# Patient Record
Sex: Female | Born: 1965
Health system: Southern US, Community
[De-identification: ages and names within clinical notes are randomized; demographics above are authoritative.]

## PROBLEM LIST (undated history)

## (undated) DIAGNOSIS — J45909 Unspecified asthma, uncomplicated: Secondary | ICD-10-CM

## (undated) DIAGNOSIS — K5900 Constipation, unspecified: Secondary | ICD-10-CM

## (undated) DIAGNOSIS — F32A Depression, unspecified: Secondary | ICD-10-CM

## (undated) DIAGNOSIS — E739 Lactose intolerance, unspecified: Secondary | ICD-10-CM

## (undated) DIAGNOSIS — F419 Anxiety disorder, unspecified: Secondary | ICD-10-CM

## (undated) DIAGNOSIS — R5383 Other fatigue: Principal | ICD-10-CM

## (undated) DIAGNOSIS — E039 Hypothyroidism, unspecified: Secondary | ICD-10-CM

## (undated) DIAGNOSIS — G709 Myoneural disorder, unspecified: Secondary | ICD-10-CM

## (undated) DIAGNOSIS — R0602 Shortness of breath: Secondary | ICD-10-CM

## (undated) DIAGNOSIS — Z86718 Personal history of other venous thrombosis and embolism: Secondary | ICD-10-CM

## (undated) DIAGNOSIS — R238 Other skin changes: Secondary | ICD-10-CM

## (undated) DIAGNOSIS — C719 Malignant neoplasm of brain, unspecified: Secondary | ICD-10-CM

## (undated) DIAGNOSIS — M2550 Pain in unspecified joint: Secondary | ICD-10-CM

## (undated) DIAGNOSIS — Q0700 Arnold-Chiari syndrome without spina bifida or hydrocephalus: Secondary | ICD-10-CM

## (undated) DIAGNOSIS — H9313 Tinnitus, bilateral: Secondary | ICD-10-CM

## (undated) DIAGNOSIS — R7303 Prediabetes: Secondary | ICD-10-CM

## (undated) DIAGNOSIS — H6980 Other specified disorders of Eustachian tube, unspecified ear: Secondary | ICD-10-CM

## (undated) DIAGNOSIS — M436 Torticollis: Secondary | ICD-10-CM

## (undated) DIAGNOSIS — G4733 Obstructive sleep apnea (adult) (pediatric): Secondary | ICD-10-CM

## (undated) DIAGNOSIS — T8859XA Other complications of anesthesia, initial encounter: Secondary | ICD-10-CM

## (undated) DIAGNOSIS — M5126 Other intervertebral disc displacement, lumbar region: Secondary | ICD-10-CM

## (undated) DIAGNOSIS — R5381 Other malaise: Principal | ICD-10-CM

## (undated) DIAGNOSIS — G43909 Migraine, unspecified, not intractable, without status migrainosus: Secondary | ICD-10-CM

## (undated) DIAGNOSIS — G8929 Other chronic pain: Secondary | ICD-10-CM

## (undated) DIAGNOSIS — R32 Unspecified urinary incontinence: Secondary | ICD-10-CM

## (undated) DIAGNOSIS — R233 Spontaneous ecchymoses: Secondary | ICD-10-CM

## (undated) DIAGNOSIS — K649 Unspecified hemorrhoids: Secondary | ICD-10-CM

## (undated) DIAGNOSIS — H6990 Unspecified Eustachian tube disorder, unspecified ear: Secondary | ICD-10-CM

## (undated) DIAGNOSIS — H53149 Visual discomfort, unspecified: Secondary | ICD-10-CM

## (undated) DIAGNOSIS — G932 Benign intracranial hypertension: Secondary | ICD-10-CM

## (undated) DIAGNOSIS — H539 Unspecified visual disturbance: Secondary | ICD-10-CM

## (undated) DIAGNOSIS — H814 Vertigo of central origin: Secondary | ICD-10-CM

## (undated) DIAGNOSIS — F909 Attention-deficit hyperactivity disorder, unspecified type: Secondary | ICD-10-CM

## (undated) DIAGNOSIS — G43019 Migraine without aura, intractable, without status migrainosus: Secondary | ICD-10-CM

## (undated) DIAGNOSIS — Q796 Ehlers-Danlos syndrome, unspecified: Secondary | ICD-10-CM

## (undated) DIAGNOSIS — B009 Herpesviral infection, unspecified: Secondary | ICD-10-CM

## (undated) DIAGNOSIS — J189 Pneumonia, unspecified organism: Secondary | ICD-10-CM

## (undated) DIAGNOSIS — E559 Vitamin D deficiency, unspecified: Secondary | ICD-10-CM

## (undated) DIAGNOSIS — K219 Gastro-esophageal reflux disease without esophagitis: Secondary | ICD-10-CM

## (undated) DIAGNOSIS — G4730 Sleep apnea, unspecified: Secondary | ICD-10-CM

## (undated) DIAGNOSIS — Q288 Other specified congenital malformations of circulatory system: Principal | ICD-10-CM

## (undated) DIAGNOSIS — Z8669 Personal history of other diseases of the nervous system and sense organs: Secondary | ICD-10-CM

## (undated) DIAGNOSIS — Z973 Presence of spectacles and contact lenses: Secondary | ICD-10-CM

## (undated) DIAGNOSIS — D496 Neoplasm of unspecified behavior of brain: Secondary | ICD-10-CM

## (undated) DIAGNOSIS — R531 Weakness: Secondary | ICD-10-CM

## (undated) DIAGNOSIS — M549 Dorsalgia, unspecified: Secondary | ICD-10-CM

## (undated) HISTORY — PX: TONSILLECTOMY AND ADENOIDECTOMY: SUR1326

## (undated) HISTORY — DX: Gastro-esophageal reflux disease without esophagitis: K21.9

## (undated) HISTORY — DX: Other specified congenital malformations of circulatory system: Q28.8

## (undated) HISTORY — DX: Dorsalgia, unspecified: M54.9

## (undated) HISTORY — DX: Pain in unspecified joint: M25.50

## (undated) HISTORY — PX: BRAIN SURGERY: SHX531

## (undated) HISTORY — DX: Depression, unspecified: F32.A

## (undated) HISTORY — DX: Attention-deficit hyperactivity disorder, unspecified type: F90.9

## (undated) HISTORY — DX: Prediabetes: R73.03

## (undated) HISTORY — PX: DILATION AND CURETTAGE OF UTERUS: SHX78

## (undated) HISTORY — DX: Arnold-Chiari syndrome without spina bifida or hydrocephalus: Q07.00

## (undated) HISTORY — DX: Other malaise: R53.81

## (undated) HISTORY — DX: Vertigo of central origin: H81.4

## (undated) HISTORY — DX: Shortness of breath: R06.02

## (undated) HISTORY — DX: Unspecified eustachian tube disorder, unspecified ear: H69.90

## (undated) HISTORY — DX: Other fatigue: R53.83

## (undated) HISTORY — DX: Hypothyroidism, unspecified: E03.9

## (undated) HISTORY — PX: BREAST BIOPSY: SHX20

## (undated) HISTORY — DX: Obstructive sleep apnea (adult) (pediatric): G47.33

## (undated) HISTORY — DX: Benign intracranial hypertension: G93.2

## (undated) HISTORY — DX: Personal history of other venous thrombosis and embolism: Z86.718

## (undated) HISTORY — DX: Other chronic pain: G89.29

## (undated) HISTORY — DX: Sleep apnea, unspecified: G47.30

## (undated) HISTORY — PX: CHOLECYSTECTOMY: SHX55

## (undated) HISTORY — DX: Anxiety disorder, unspecified: F41.9

## (undated) HISTORY — DX: Vitamin D deficiency, unspecified: E55.9

## (undated) HISTORY — DX: Unspecified hemorrhoids: K64.9

## (undated) HISTORY — DX: Lactose intolerance, unspecified: E73.9

## (undated) HISTORY — DX: Neoplasm of unspecified behavior of brain: D49.6

## (undated) HISTORY — DX: Migraine, unspecified, not intractable, without status migrainosus: G43.909

## (undated) HISTORY — DX: Migraine without aura, intractable, without status migrainosus: G43.019

## (undated) HISTORY — PX: OTHER SURGICAL HISTORY: SHX169

## (undated) HISTORY — PX: BREAST EXCISIONAL BIOPSY: SUR124

## (undated) HISTORY — DX: Constipation, unspecified: K59.00

## (undated) HISTORY — PX: UPPER GI ENDOSCOPY: SHX6162

## (undated) HISTORY — DX: Ehlers-Danlos syndrome, unspecified: Q79.60

## (undated) HISTORY — DX: Herpesviral infection, unspecified: B00.9

## (undated) HISTORY — DX: Other specified disorders of Eustachian tube, unspecified ear: H69.80

---

## 2006-06-22 ENCOUNTER — Ambulatory Visit: Payer: Self-pay | Admitting: Gastroenterology

## 2006-09-07 ENCOUNTER — Emergency Department (HOSPITAL_COMMUNITY): Admission: EM | Admit: 2006-09-07 | Discharge: 2006-09-07 | Payer: Self-pay | Admitting: Emergency Medicine

## 2008-03-10 HISTORY — PX: OTHER SURGICAL HISTORY: SHX169

## 2009-02-05 ENCOUNTER — Encounter: Admission: RE | Admit: 2009-02-05 | Discharge: 2009-02-05 | Payer: Self-pay | Admitting: Anesthesiology

## 2010-01-16 ENCOUNTER — Encounter: Admission: RE | Admit: 2010-01-16 | Discharge: 2010-01-16 | Payer: Self-pay | Admitting: Internal Medicine

## 2010-05-23 ENCOUNTER — Other Ambulatory Visit: Payer: Self-pay | Admitting: *Deleted

## 2010-05-23 ENCOUNTER — Ambulatory Visit
Admission: RE | Admit: 2010-05-23 | Discharge: 2010-05-23 | Disposition: A | Discharge status: Home / Self Care | Payer: 59 | Source: Ambulatory Visit | Attending: *Deleted | Admitting: *Deleted

## 2010-05-23 DIAGNOSIS — M549 Dorsalgia, unspecified: Secondary | ICD-10-CM

## 2010-05-23 DIAGNOSIS — M542 Cervicalgia: Secondary | ICD-10-CM

## 2010-07-26 NOTE — Letter (Signed)
June 22, 2006     April Meyer, April Meyer  MRN:  [id number]  /  DOB:  07-Jul-1965   Dear [name]:   It is my pleasure to have treated you recently as a new patient in my  office.  I appreciate your confidence and the opportunity to participate  in your care.   Since I do have a busy inpatient endoscopy schedule and office schedule,  my office hours vary weekly.  I am, however, available for emergency  calls every day through my office.  If I cannot promptly meet an urgent  office appointment, another one of our gastroenterologists will be able  to assist you.   My well-trained staff are prepared to help you at all times.  For  emergencies after office hours, a physician from our gastroenterology  section is always available through my 24-hour answering service.   While you are under my care, I encourage discussion of your questions  and concerns, and I will be happy to return your calls as soon as I am  available.   Once again, I welcome you as a new patient and I look forward to a happy  and healthy relationship.    Sincerely,      Barbette Hair. Arlyce Dice, MD,FACG  Electronically Signed    RDK/MedQ  DD: 06/22/2006  DT: 06/22/2006  Job #: [id number]

## 2010-07-26 NOTE — Letter (Signed)
June 22, 2006    Tammy R. Collins Scotland, M.D.  [address] [place] [address]   RE:  April Meyer, April Meyer  MRN:  [id number]  /  DOB:  February 25, 1966   Dear Dr. Collins Scotland:   Upon your kind referral, I had the pleasure of evaluating your patient  and I am pleased to offer my findings.  I saw Chloris Marcoux in the office  today.  Enclosed is a copy of my progress note that details my findings  and recommendations.   Thank you for the opportunity to participate in your patient's care.    Sincerely,      Barbette Hair. Arlyce Dice, MD,FACG  Electronically Signed    RDK/MedQ  DD: 06/22/2006  DT: 06/22/2006  Job #: [id number]

## 2010-07-26 NOTE — Assessment & Plan Note (Signed)
[facility]                         GASTROENTEROLOGY OFFICE NOTE   NAME:Julio, Dariann                             MRN:          [id number]  DATE:06/22/2006                            DOB:          1965-11-25    REASON FOR CONSULTATION:  Abdominal pain.   HISTORY OF PRESENT ILLNESS:  Ms. Menard is a pleasant 45 year old white  female, who is referred through the courtesy of Dr. Collins Scotland for  evaluation.  Over the last couple of months, she has been complaining of  right upper quadrant pain.  Pain consisted of stabbing pain lasting  seconds.  They may be fairly constant by virtue of it being recurrent.  Pain that is without radiation.  It does not change by movements,  bending, breathing, or twisting.  It is not affected by eating or bowel  movements.  She is without fever.  She may have mild nausea if the pain  is severe.  She is status post cholecystectomy where she had, by her  description, what sounds like gallbladder sludge.  Recent ultrasound,  liver tests, and CBC were normal.  She does claim that her pain has been  lessening in frequency and intensity over the last 2 weeks.   PAST MEDICAL HISTORY:  Pertinent for thyroid disease.   FAMILY HISTORY:  Positive for a father with inflammatory bowel disease.   MEDICATIONS:  Levothyroxine and Prevacid.   SHE CLAIMS TO HAVE HAD AN ANAPHYLACTIC REACTION TO PREDNISONE.   She smokes a pack a day.  She does not drink.  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   REVIEW OF SYSTEMS:  Reviewed and is negative.   PHYSICAL EXAMINATION:  VITAL SIGNS:  VITAL SIGNS:  Pulse 76, blood  pressure 106/72, weight 218.  HEENT: EOMI. PERRLA. Sclerae are anicteric.  Conjunctivae are pink.  NECK:  Supple without thyromegaly, adenopathy or carotid bruits.  CHEST:  Clear to auscultation and percussion without adventitious  sounds.  CARDIAC:  Regular rhythm; normal S1 S2.  There are no murmurs, gallops  or rubs.  ABDOMEN:  She has  mild right upper quadrant tenderness in the subcostal  area.  Tenderness is worsened with abdominal wall flexion.  There are no  abdominal masses or organomegaly.  EXTREMITIES:  Full range of motion.  No cyanosis, clubbing or edema.  RECTAL:  Deferred.   IMPRESSION:  Intermittent right upper quadrant pain.  I suspect this is  abdominal wall pain.  It is unlikely this is due to visceral pain.   RECOMMENDATION:  Trial of Clinoril 150 mg b.i.d. x5-7 days and then as  needed.   I carefully instructed Ms. Radigan to contact me if she has developed  recurrent or worsening symptoms.     Barbette Hair. Arlyce Dice, MD,FACG  Electronically Signed    RDK/MedQ  DD: 06/22/2006  DT: 06/22/2006  Job #: [id number]   cc:   Tammy R. Collins Scotland, M.D.

## 2010-08-08 ENCOUNTER — Ambulatory Visit (INDEPENDENT_AMBULATORY_CARE_PROVIDER_SITE_OTHER): Payer: 59 | Admitting: Gynecology

## 2010-08-08 DIAGNOSIS — A6004 Herpesviral vulvovaginitis: Secondary | ICD-10-CM

## 2010-08-08 DIAGNOSIS — G935 Compression of brain: Secondary | ICD-10-CM

## 2010-08-08 DIAGNOSIS — N393 Stress incontinence (female) (male): Secondary | ICD-10-CM

## 2010-08-08 DIAGNOSIS — Z30431 Encounter for routine checking of intrauterine contraceptive device: Secondary | ICD-10-CM

## 2010-09-03 ENCOUNTER — Ambulatory Visit (HOSPITAL_COMMUNITY): Admission: RE | Admit: 2010-09-03 | Payer: 59 | Source: Ambulatory Visit | Admitting: Urology

## 2010-09-09 ENCOUNTER — Ambulatory Visit: Payer: 59 | Admitting: Gynecology

## 2010-10-09 ENCOUNTER — Ambulatory Visit: Payer: 59 | Admitting: Gynecology

## 2010-10-09 HISTORY — PX: INTRAUTERINE DEVICE INSERTION: SHX323

## 2010-10-28 ENCOUNTER — Ambulatory Visit: Payer: 59 | Admitting: Gynecology

## 2010-10-29 ENCOUNTER — Ambulatory Visit (INDEPENDENT_AMBULATORY_CARE_PROVIDER_SITE_OTHER): Payer: 59 | Admitting: Gynecology

## 2010-10-29 ENCOUNTER — Encounter: Payer: Self-pay | Admitting: Gynecology

## 2010-10-29 VITALS — BP 120/74

## 2010-10-29 DIAGNOSIS — Z30431 Encounter for routine checking of intrauterine contraceptive device: Secondary | ICD-10-CM

## 2010-10-29 MED ORDER — LEVONORGESTREL 20 MCG/24HR IU IUD
INTRAUTERINE_SYSTEM | Freq: Once | INTRAUTERINE | Status: AC
  Administered 2010-10-30: 09:00:00 via INTRAUTERINE

## 2010-10-29 NOTE — Progress Notes (Signed)
Patient presents for Mirena IUD placement currently on a regular menses. We previously counseled her per 08/08/2010 note. I reviewed again with her what's involved with Mirena IUD placement, the procedure risks, issues, short-term infection, uterine perforation requiring surgery to remove, long-term issues of infection and failure with pregnancy. She has read through the booklet, signed the consent form and again given her neurologic issues she understands the thrombosis risk at least theoretically to include stroke heart attack DVT accepts this.  She has had a Mirena IUD in the past and did well with this.  Exam Pelvic: External BUS vagina normal, cervix normal light menses flow, uterus retroverted normal size midline mobile nontender adnexa without masses or tenderness  Procedure: Cervix was visualized with a speculum cleansed with Betadine, single-tooth tenaculum anterior lip stabilization, uterus was sounded and subsequently a Mirena IUD was placed according to manufacturer's recommendation. String was trimmed patient tolerated well will followup in one month for postinsertional check.

## 2010-10-31 ENCOUNTER — Other Ambulatory Visit: Payer: Self-pay | Admitting: Anesthesiology

## 2010-10-31 DIAGNOSIS — M545 Low back pain, unspecified: Secondary | ICD-10-CM

## 2010-11-04 ENCOUNTER — Ambulatory Visit
Admission: RE | Admit: 2010-11-04 | Discharge: 2010-11-04 | Disposition: A | Discharge status: Home / Self Care | Payer: 59 | Source: Ambulatory Visit | Attending: Anesthesiology | Admitting: Anesthesiology

## 2010-11-04 DIAGNOSIS — M545 Low back pain, unspecified: Secondary | ICD-10-CM

## 2010-11-09 HISTORY — PX: IUD REMOVAL: SHX5392

## 2010-11-15 ENCOUNTER — Ambulatory Visit (INDEPENDENT_AMBULATORY_CARE_PROVIDER_SITE_OTHER): Payer: 59 | Admitting: Gynecology

## 2010-11-15 ENCOUNTER — Encounter: Payer: Self-pay | Admitting: Gynecology

## 2010-11-15 DIAGNOSIS — T839XXA Unspecified complication of genitourinary prosthetic device, implant and graft, initial encounter: Secondary | ICD-10-CM

## 2010-11-15 DIAGNOSIS — T8389XA Other specified complication of genitourinary prosthetic devices, implants and grafts, initial encounter: Secondary | ICD-10-CM

## 2010-11-15 NOTE — Progress Notes (Signed)
Patient presents wanting her IUD removed. She just had placed last month and is having more hormone side effects to include bilateral breast tenderness and a 5 pound weight gain. She feels bloated and wants to have it removed. I discussed with her that we could try to treat the symptoms individually keep the IUD for now and hopefully she will accommodate to the symptoms over the next month or so but she refuses and wants the IUD removed. She is using this for contraception although notes that her coital frequency is rare. I stressed the need for using alternative contraception she does become sexually active.  Exam Breasts: Right and left without masses retractions discharge adenopathy Abdomen: Soft nontender without masses guarding rebound organomegaly Pelvic: External BUS vagina normal cervix normal IUD string visualized grasped with Bozeman forcep and removed shown to the patient and discarded. Bimanual uterus normal size midline mobile nontender adnexa without masses or tenderness  Assessment and plan: Intolerance to IUD. IUD was removed. Patient instructed to use alternative contraception follow up with me she wants to discuss other forms of birth control.  If her symptoms of bloating breast tenderness continue she history of present for further evaluation.

## 2010-11-26 ENCOUNTER — Ambulatory Visit: Payer: 59 | Admitting: Gynecology

## 2011-03-14 DIAGNOSIS — M545 Low back pain, unspecified: Secondary | ICD-10-CM | POA: Diagnosis not present

## 2011-03-14 DIAGNOSIS — G935 Compression of brain: Secondary | ICD-10-CM | POA: Diagnosis not present

## 2011-03-14 DIAGNOSIS — M542 Cervicalgia: Secondary | ICD-10-CM | POA: Diagnosis not present

## 2011-03-19 DIAGNOSIS — G935 Compression of brain: Secondary | ICD-10-CM | POA: Diagnosis not present

## 2011-03-19 DIAGNOSIS — M545 Low back pain, unspecified: Secondary | ICD-10-CM | POA: Diagnosis not present

## 2011-03-19 DIAGNOSIS — M542 Cervicalgia: Secondary | ICD-10-CM | POA: Diagnosis not present

## 2011-03-27 DIAGNOSIS — M545 Low back pain, unspecified: Secondary | ICD-10-CM | POA: Diagnosis not present

## 2011-03-27 DIAGNOSIS — M542 Cervicalgia: Secondary | ICD-10-CM | POA: Diagnosis not present

## 2011-03-27 DIAGNOSIS — G935 Compression of brain: Secondary | ICD-10-CM | POA: Diagnosis not present

## 2011-04-01 DIAGNOSIS — G935 Compression of brain: Secondary | ICD-10-CM | POA: Diagnosis not present

## 2011-04-01 DIAGNOSIS — M545 Low back pain, unspecified: Secondary | ICD-10-CM | POA: Diagnosis not present

## 2011-04-01 DIAGNOSIS — M542 Cervicalgia: Secondary | ICD-10-CM | POA: Diagnosis not present

## 2011-04-02 DIAGNOSIS — M79609 Pain in unspecified limb: Secondary | ICD-10-CM | POA: Diagnosis not present

## 2011-04-02 DIAGNOSIS — M542 Cervicalgia: Secondary | ICD-10-CM | POA: Diagnosis not present

## 2011-04-02 DIAGNOSIS — G4452 New daily persistent headache (NDPH): Secondary | ICD-10-CM | POA: Diagnosis not present

## 2011-04-02 DIAGNOSIS — IMO0002 Reserved for concepts with insufficient information to code with codable children: Secondary | ICD-10-CM | POA: Diagnosis not present

## 2011-04-02 DIAGNOSIS — R51 Headache: Secondary | ICD-10-CM | POA: Diagnosis not present

## 2011-04-02 DIAGNOSIS — M5126 Other intervertebral disc displacement, lumbar region: Secondary | ICD-10-CM | POA: Diagnosis not present

## 2011-04-08 DIAGNOSIS — F411 Generalized anxiety disorder: Secondary | ICD-10-CM | POA: Diagnosis not present

## 2011-04-10 DIAGNOSIS — M542 Cervicalgia: Secondary | ICD-10-CM | POA: Diagnosis not present

## 2011-04-10 DIAGNOSIS — M545 Low back pain, unspecified: Secondary | ICD-10-CM | POA: Diagnosis not present

## 2011-04-10 DIAGNOSIS — G935 Compression of brain: Secondary | ICD-10-CM | POA: Diagnosis not present

## 2011-04-11 DIAGNOSIS — G245 Blepharospasm: Secondary | ICD-10-CM | POA: Diagnosis not present

## 2011-04-11 DIAGNOSIS — E559 Vitamin D deficiency, unspecified: Secondary | ICD-10-CM | POA: Diagnosis not present

## 2011-04-11 DIAGNOSIS — E039 Hypothyroidism, unspecified: Secondary | ICD-10-CM | POA: Diagnosis not present

## 2011-04-14 DIAGNOSIS — M542 Cervicalgia: Secondary | ICD-10-CM | POA: Diagnosis not present

## 2011-04-14 DIAGNOSIS — M545 Low back pain, unspecified: Secondary | ICD-10-CM | POA: Diagnosis not present

## 2011-04-14 DIAGNOSIS — G935 Compression of brain: Secondary | ICD-10-CM | POA: Diagnosis not present

## 2011-04-15 DIAGNOSIS — M357 Hypermobility syndrome: Secondary | ICD-10-CM | POA: Diagnosis not present

## 2011-04-15 DIAGNOSIS — G4733 Obstructive sleep apnea (adult) (pediatric): Secondary | ICD-10-CM | POA: Insufficient documentation

## 2011-04-15 DIAGNOSIS — E669 Obesity, unspecified: Secondary | ICD-10-CM | POA: Insufficient documentation

## 2011-04-15 DIAGNOSIS — M5126 Other intervertebral disc displacement, lumbar region: Secondary | ICD-10-CM | POA: Diagnosis not present

## 2011-04-15 DIAGNOSIS — F909 Attention-deficit hyperactivity disorder, unspecified type: Secondary | ICD-10-CM | POA: Diagnosis not present

## 2011-04-15 DIAGNOSIS — C719 Malignant neoplasm of brain, unspecified: Secondary | ICD-10-CM | POA: Insufficient documentation

## 2011-04-15 DIAGNOSIS — E039 Hypothyroidism, unspecified: Secondary | ICD-10-CM | POA: Diagnosis not present

## 2011-04-15 DIAGNOSIS — J45909 Unspecified asthma, uncomplicated: Secondary | ICD-10-CM | POA: Diagnosis not present

## 2011-04-18 DIAGNOSIS — M542 Cervicalgia: Secondary | ICD-10-CM | POA: Diagnosis not present

## 2011-04-18 DIAGNOSIS — G935 Compression of brain: Secondary | ICD-10-CM | POA: Diagnosis not present

## 2011-04-18 DIAGNOSIS — M545 Low back pain, unspecified: Secondary | ICD-10-CM | POA: Diagnosis not present

## 2011-04-19 DIAGNOSIS — J019 Acute sinusitis, unspecified: Secondary | ICD-10-CM | POA: Diagnosis not present

## 2011-04-19 DIAGNOSIS — J029 Acute pharyngitis, unspecified: Secondary | ICD-10-CM | POA: Diagnosis not present

## 2011-04-21 DIAGNOSIS — M545 Low back pain, unspecified: Secondary | ICD-10-CM | POA: Diagnosis not present

## 2011-04-21 DIAGNOSIS — M542 Cervicalgia: Secondary | ICD-10-CM | POA: Diagnosis not present

## 2011-04-21 DIAGNOSIS — G935 Compression of brain: Secondary | ICD-10-CM | POA: Diagnosis not present

## 2011-05-02 DIAGNOSIS — M545 Low back pain, unspecified: Secondary | ICD-10-CM | POA: Diagnosis not present

## 2011-05-02 DIAGNOSIS — G935 Compression of brain: Secondary | ICD-10-CM | POA: Diagnosis not present

## 2011-05-02 DIAGNOSIS — M542 Cervicalgia: Secondary | ICD-10-CM | POA: Diagnosis not present

## 2011-05-08 DIAGNOSIS — M542 Cervicalgia: Secondary | ICD-10-CM | POA: Diagnosis not present

## 2011-05-08 DIAGNOSIS — M545 Low back pain, unspecified: Secondary | ICD-10-CM | POA: Diagnosis not present

## 2011-05-08 DIAGNOSIS — M79609 Pain in unspecified limb: Secondary | ICD-10-CM | POA: Diagnosis not present

## 2011-05-08 DIAGNOSIS — R51 Headache: Secondary | ICD-10-CM | POA: Diagnosis not present

## 2011-05-08 DIAGNOSIS — G935 Compression of brain: Secondary | ICD-10-CM | POA: Diagnosis not present

## 2011-05-13 DIAGNOSIS — M545 Low back pain, unspecified: Secondary | ICD-10-CM | POA: Diagnosis not present

## 2011-05-13 DIAGNOSIS — M542 Cervicalgia: Secondary | ICD-10-CM | POA: Diagnosis not present

## 2011-05-13 DIAGNOSIS — G935 Compression of brain: Secondary | ICD-10-CM | POA: Diagnosis not present

## 2011-05-16 DIAGNOSIS — G935 Compression of brain: Secondary | ICD-10-CM | POA: Diagnosis not present

## 2011-05-16 DIAGNOSIS — M542 Cervicalgia: Secondary | ICD-10-CM | POA: Diagnosis not present

## 2011-05-16 DIAGNOSIS — M545 Low back pain, unspecified: Secondary | ICD-10-CM | POA: Diagnosis not present

## 2011-05-20 DIAGNOSIS — M545 Low back pain, unspecified: Secondary | ICD-10-CM | POA: Diagnosis not present

## 2011-05-20 DIAGNOSIS — G935 Compression of brain: Secondary | ICD-10-CM | POA: Diagnosis not present

## 2011-05-20 DIAGNOSIS — M542 Cervicalgia: Secondary | ICD-10-CM | POA: Diagnosis not present

## 2011-05-22 DIAGNOSIS — M542 Cervicalgia: Secondary | ICD-10-CM | POA: Diagnosis not present

## 2011-05-22 DIAGNOSIS — M47817 Spondylosis without myelopathy or radiculopathy, lumbosacral region: Secondary | ICD-10-CM | POA: Diagnosis not present

## 2011-05-22 DIAGNOSIS — Z981 Arthrodesis status: Secondary | ICD-10-CM | POA: Diagnosis not present

## 2011-05-23 DIAGNOSIS — M542 Cervicalgia: Secondary | ICD-10-CM | POA: Diagnosis not present

## 2011-05-23 DIAGNOSIS — M545 Low back pain, unspecified: Secondary | ICD-10-CM | POA: Diagnosis not present

## 2011-05-23 DIAGNOSIS — G935 Compression of brain: Secondary | ICD-10-CM | POA: Diagnosis not present

## 2011-05-27 DIAGNOSIS — M357 Hypermobility syndrome: Secondary | ICD-10-CM | POA: Diagnosis not present

## 2011-05-27 DIAGNOSIS — Z01810 Encounter for preprocedural cardiovascular examination: Secondary | ICD-10-CM | POA: Diagnosis not present

## 2011-05-28 DIAGNOSIS — M545 Low back pain, unspecified: Secondary | ICD-10-CM | POA: Diagnosis not present

## 2011-05-28 DIAGNOSIS — G935 Compression of brain: Secondary | ICD-10-CM | POA: Diagnosis not present

## 2011-05-28 DIAGNOSIS — M542 Cervicalgia: Secondary | ICD-10-CM | POA: Diagnosis not present

## 2011-05-30 DIAGNOSIS — M542 Cervicalgia: Secondary | ICD-10-CM | POA: Diagnosis not present

## 2011-05-30 DIAGNOSIS — G935 Compression of brain: Secondary | ICD-10-CM | POA: Diagnosis not present

## 2011-05-30 DIAGNOSIS — M545 Low back pain, unspecified: Secondary | ICD-10-CM | POA: Diagnosis not present

## 2011-06-03 DIAGNOSIS — E039 Hypothyroidism, unspecified: Secondary | ICD-10-CM | POA: Diagnosis not present

## 2011-06-03 DIAGNOSIS — F909 Attention-deficit hyperactivity disorder, unspecified type: Secondary | ICD-10-CM | POA: Diagnosis not present

## 2011-06-03 DIAGNOSIS — M542 Cervicalgia: Secondary | ICD-10-CM | POA: Diagnosis not present

## 2011-06-03 DIAGNOSIS — E559 Vitamin D deficiency, unspecified: Secondary | ICD-10-CM | POA: Diagnosis not present

## 2011-06-03 DIAGNOSIS — M545 Low back pain, unspecified: Secondary | ICD-10-CM | POA: Diagnosis not present

## 2011-06-03 DIAGNOSIS — F411 Generalized anxiety disorder: Secondary | ICD-10-CM | POA: Diagnosis not present

## 2011-06-03 DIAGNOSIS — G935 Compression of brain: Secondary | ICD-10-CM | POA: Diagnosis not present

## 2011-06-04 DIAGNOSIS — G935 Compression of brain: Secondary | ICD-10-CM | POA: Diagnosis not present

## 2011-06-04 DIAGNOSIS — M545 Low back pain, unspecified: Secondary | ICD-10-CM | POA: Diagnosis not present

## 2011-06-04 DIAGNOSIS — M542 Cervicalgia: Secondary | ICD-10-CM | POA: Diagnosis not present

## 2011-06-05 DIAGNOSIS — M545 Low back pain, unspecified: Secondary | ICD-10-CM | POA: Diagnosis not present

## 2011-06-05 DIAGNOSIS — R51 Headache: Secondary | ICD-10-CM | POA: Diagnosis not present

## 2011-06-05 DIAGNOSIS — G479 Sleep disorder, unspecified: Secondary | ICD-10-CM | POA: Diagnosis not present

## 2011-06-17 DIAGNOSIS — G935 Compression of brain: Secondary | ICD-10-CM | POA: Diagnosis not present

## 2011-06-17 DIAGNOSIS — M545 Low back pain, unspecified: Secondary | ICD-10-CM | POA: Diagnosis not present

## 2011-06-17 DIAGNOSIS — M542 Cervicalgia: Secondary | ICD-10-CM | POA: Diagnosis not present

## 2011-06-18 DIAGNOSIS — K644 Residual hemorrhoidal skin tags: Secondary | ICD-10-CM | POA: Diagnosis not present

## 2011-06-18 DIAGNOSIS — K5900 Constipation, unspecified: Secondary | ICD-10-CM | POA: Diagnosis not present

## 2011-06-18 DIAGNOSIS — R195 Other fecal abnormalities: Secondary | ICD-10-CM | POA: Diagnosis not present

## 2011-06-20 DIAGNOSIS — M542 Cervicalgia: Secondary | ICD-10-CM | POA: Diagnosis not present

## 2011-06-20 DIAGNOSIS — G935 Compression of brain: Secondary | ICD-10-CM | POA: Diagnosis not present

## 2011-06-20 DIAGNOSIS — M545 Low back pain, unspecified: Secondary | ICD-10-CM | POA: Diagnosis not present

## 2011-06-23 DIAGNOSIS — M545 Low back pain, unspecified: Secondary | ICD-10-CM | POA: Diagnosis not present

## 2011-06-23 DIAGNOSIS — M542 Cervicalgia: Secondary | ICD-10-CM | POA: Diagnosis not present

## 2011-06-23 DIAGNOSIS — G935 Compression of brain: Secondary | ICD-10-CM | POA: Diagnosis not present

## 2011-06-26 DIAGNOSIS — K140 Glossitis: Secondary | ICD-10-CM | POA: Diagnosis not present

## 2011-06-26 DIAGNOSIS — H6980 Other specified disorders of Eustachian tube, unspecified ear: Secondary | ICD-10-CM | POA: Diagnosis not present

## 2011-07-03 DIAGNOSIS — K649 Unspecified hemorrhoids: Secondary | ICD-10-CM | POA: Diagnosis not present

## 2011-07-03 DIAGNOSIS — R195 Other fecal abnormalities: Secondary | ICD-10-CM | POA: Diagnosis not present

## 2011-07-03 DIAGNOSIS — D126 Benign neoplasm of colon, unspecified: Secondary | ICD-10-CM | POA: Diagnosis not present

## 2011-07-04 DIAGNOSIS — M545 Low back pain, unspecified: Secondary | ICD-10-CM | POA: Diagnosis not present

## 2011-07-04 DIAGNOSIS — M79609 Pain in unspecified limb: Secondary | ICD-10-CM | POA: Diagnosis not present

## 2011-07-04 DIAGNOSIS — IMO0002 Reserved for concepts with insufficient information to code with codable children: Secondary | ICD-10-CM | POA: Diagnosis not present

## 2011-07-16 DIAGNOSIS — G44219 Episodic tension-type headache, not intractable: Secondary | ICD-10-CM | POA: Diagnosis not present

## 2011-07-16 DIAGNOSIS — M542 Cervicalgia: Secondary | ICD-10-CM | POA: Diagnosis not present

## 2011-07-16 DIAGNOSIS — G43009 Migraine without aura, not intractable, without status migrainosus: Secondary | ICD-10-CM | POA: Diagnosis not present

## 2011-07-22 DIAGNOSIS — F909 Attention-deficit hyperactivity disorder, unspecified type: Secondary | ICD-10-CM | POA: Diagnosis not present

## 2011-07-22 DIAGNOSIS — E669 Obesity, unspecified: Secondary | ICD-10-CM | POA: Diagnosis not present

## 2011-07-22 DIAGNOSIS — E039 Hypothyroidism, unspecified: Secondary | ICD-10-CM | POA: Diagnosis not present

## 2011-07-24 DIAGNOSIS — M542 Cervicalgia: Secondary | ICD-10-CM | POA: Diagnosis not present

## 2011-07-24 DIAGNOSIS — C719 Malignant neoplasm of brain, unspecified: Secondary | ICD-10-CM | POA: Diagnosis not present

## 2011-07-24 DIAGNOSIS — Z09 Encounter for follow-up examination after completed treatment for conditions other than malignant neoplasm: Secondary | ICD-10-CM | POA: Diagnosis not present

## 2011-07-29 DIAGNOSIS — M545 Low back pain, unspecified: Secondary | ICD-10-CM | POA: Diagnosis not present

## 2011-07-29 DIAGNOSIS — G8928 Other chronic postprocedural pain: Secondary | ICD-10-CM | POA: Diagnosis not present

## 2011-07-29 DIAGNOSIS — IMO0002 Reserved for concepts with insufficient information to code with codable children: Secondary | ICD-10-CM | POA: Diagnosis not present

## 2011-07-29 DIAGNOSIS — M531 Cervicobrachial syndrome: Secondary | ICD-10-CM | POA: Diagnosis not present

## 2011-07-30 DIAGNOSIS — Z888 Allergy status to other drugs, medicaments and biological substances status: Secondary | ICD-10-CM | POA: Diagnosis not present

## 2011-07-30 DIAGNOSIS — Z87891 Personal history of nicotine dependence: Secondary | ICD-10-CM | POA: Insufficient documentation

## 2011-07-30 DIAGNOSIS — R51 Headache: Secondary | ICD-10-CM | POA: Diagnosis not present

## 2011-07-30 DIAGNOSIS — F419 Anxiety disorder, unspecified: Secondary | ICD-10-CM | POA: Insufficient documentation

## 2011-07-30 DIAGNOSIS — Z01818 Encounter for other preprocedural examination: Secondary | ICD-10-CM | POA: Diagnosis not present

## 2011-07-30 DIAGNOSIS — Z9889 Other specified postprocedural states: Secondary | ICD-10-CM | POA: Diagnosis not present

## 2011-07-30 DIAGNOSIS — Z91018 Allergy to other foods: Secondary | ICD-10-CM | POA: Diagnosis not present

## 2011-07-30 DIAGNOSIS — F32A Depression, unspecified: Secondary | ICD-10-CM | POA: Insufficient documentation

## 2011-07-30 DIAGNOSIS — J45909 Unspecified asthma, uncomplicated: Secondary | ICD-10-CM | POA: Diagnosis not present

## 2011-07-30 DIAGNOSIS — I498 Other specified cardiac arrhythmias: Secondary | ICD-10-CM | POA: Diagnosis not present

## 2011-07-30 DIAGNOSIS — Z9089 Acquired absence of other organs: Secondary | ICD-10-CM | POA: Diagnosis not present

## 2011-07-30 DIAGNOSIS — M542 Cervicalgia: Secondary | ICD-10-CM | POA: Diagnosis not present

## 2011-07-30 DIAGNOSIS — F909 Attention-deficit hyperactivity disorder, unspecified type: Secondary | ICD-10-CM | POA: Diagnosis not present

## 2011-07-31 ENCOUNTER — Ambulatory Visit (INDEPENDENT_AMBULATORY_CARE_PROVIDER_SITE_OTHER): Payer: Medicare Other | Admitting: Gynecology

## 2011-07-31 ENCOUNTER — Encounter: Payer: Self-pay | Admitting: Gynecology

## 2011-07-31 VITALS — BP 118/74

## 2011-07-31 DIAGNOSIS — Z975 Presence of (intrauterine) contraceptive device: Secondary | ICD-10-CM

## 2011-07-31 DIAGNOSIS — N920 Excessive and frequent menstruation with regular cycle: Secondary | ICD-10-CM

## 2011-07-31 MED ORDER — TRANEXAMIC ACID 650 MG PO TABS: ORAL_TABLET | ORAL | Status: DC

## 2011-07-31 NOTE — Patient Instructions (Signed)
Intrauterine Device Information An intrauterine device (IUD) is inserted into your uterus and prevents pregnancy. There are 2 types of IUDs available:  Copper IUD. This type of IUD is wrapped in copper wire and is placed inside the uterus. Copper makes the uterus and fallopian tubes produce a fluid that kills sperm. The copper IUD can stay in place for 10 years.   Hormone IUD. This type of IUD contains the hormone progestin (synthetic progesterone). The hormone thickens the cervical mucus and prevents sperm from entering the uterus, and it also thins the uterine lining to prevent implantation of a fertilized egg. The hormone can weaken or kill the sperm that get into the uterus. The hormone IUD can stay in place for 5 years.  Your caregiver will make sure you are a good candidate for a contraceptive IUD. Discuss with your caregiver the possible side effects. ADVANTAGES  It is highly effective, reversible, long-acting, and low maintenance.   There are no estrogen-related side effects.   An IUD can be used when breastfeeding.   It is not associated with weight gain.   It works immediately after insertion.   The copper IUD does not interfere with your female hormones.   The progesterone IUD can make heavy menstrual periods lighter.   The progesterone IUD can be used for 5 years.   The copper IUD can be used for 10 years.  DISADVANTAGES  The progesterone IUD can be associated with irregular bleeding patterns.   The copper IUD can make your menstrual flow heavier and more painful.   You may experience cramping and vaginal bleeding after insertion.  Document Released: 01/29/2004 Document Revised: 02/13/2011 Document Reviewed: 06/29/2010 Exit[employer] Patient Information 2012 [employer]. 

## 2011-07-31 NOTE — Progress Notes (Signed)
Patient is a 46 year old who presented to the office today to discuss contraceptive options. Patient has had a Mirena IUD twice in the past and the last was removed in September 2012 because of bloating sensation and breast tenderness. She had been using condoms at times when she was sexually active. Patient has a history of Arnold-Chiari deformity and is scheduled for additional surgery next week to remove some screws.  We had a detailed discussion of different contraceptive options. She would like if all possible a nonhormonal type of contraception aside from condoms. She does have heavy periods as well that last 5-7 days. We discussed the ParaGard T380A IUD which is 99% effective and is good for 10 years. Literature information was provided and per request we proceeded to place one in today. We also recommended that she may want to consider taking Lysteda 650 mg 2 tablets 3 times a day for 5 days during her menstrual cycle.  Procedure note: Exam: Bartholin urethra Skene glands: Within normal limits Vagina: No lesions or discharge Cervix: No lesions or discharge Uterus: Anteverted normal size shape and consistency Adnexa: No palpable masses or tenderness Rectal: Not examined  The cervix was cleansed with Betadine solution. A single-tooth tenaculum was placed on the anterior cervical lip. The uterus sounded to 7 cm. The ParaGard T380A IUD was inserted in sterile fashion and the string was trimmed. The single-tooth tenaculum was then removed. Patient tolerated procedure well and will return to the office in one month for followup.

## 2011-08-06 DIAGNOSIS — T8489XA Other specified complication of internal orthopedic prosthetic devices, implants and grafts, initial encounter: Secondary | ICD-10-CM | POA: Diagnosis not present

## 2011-08-06 DIAGNOSIS — R51 Headache: Secondary | ICD-10-CM | POA: Diagnosis not present

## 2011-08-06 DIAGNOSIS — E669 Obesity, unspecified: Secondary | ICD-10-CM | POA: Diagnosis present

## 2011-08-06 DIAGNOSIS — E039 Hypothyroidism, unspecified: Secondary | ICD-10-CM | POA: Diagnosis present

## 2011-08-06 DIAGNOSIS — J45909 Unspecified asthma, uncomplicated: Secondary | ICD-10-CM | POA: Diagnosis not present

## 2011-08-06 DIAGNOSIS — M542 Cervicalgia: Secondary | ICD-10-CM | POA: Diagnosis present

## 2011-08-06 DIAGNOSIS — G4733 Obstructive sleep apnea (adult) (pediatric): Secondary | ICD-10-CM | POA: Diagnosis present

## 2011-08-06 DIAGNOSIS — Z87891 Personal history of nicotine dependence: Secondary | ICD-10-CM | POA: Diagnosis not present

## 2011-08-06 DIAGNOSIS — Z9119 Patient's noncompliance with other medical treatment and regimen: Secondary | ICD-10-CM | POA: Diagnosis not present

## 2011-08-06 DIAGNOSIS — C719 Malignant neoplasm of brain, unspecified: Secondary | ICD-10-CM | POA: Diagnosis not present

## 2011-08-06 DIAGNOSIS — G935 Compression of brain: Secondary | ICD-10-CM | POA: Diagnosis not present

## 2011-08-06 DIAGNOSIS — Z91199 Patient's noncompliance with other medical treatment and regimen due to unspecified reason: Secondary | ICD-10-CM | POA: Diagnosis not present

## 2011-08-06 DIAGNOSIS — Z6833 Body mass index (BMI) 33.0-33.9, adult: Secondary | ICD-10-CM | POA: Diagnosis not present

## 2011-08-06 DIAGNOSIS — F909 Attention-deficit hyperactivity disorder, unspecified type: Secondary | ICD-10-CM | POA: Diagnosis present

## 2011-08-06 DIAGNOSIS — K219 Gastro-esophageal reflux disease without esophagitis: Secondary | ICD-10-CM | POA: Diagnosis present

## 2011-08-06 DIAGNOSIS — F411 Generalized anxiety disorder: Secondary | ICD-10-CM | POA: Diagnosis present

## 2011-08-06 DIAGNOSIS — M357 Hypermobility syndrome: Secondary | ICD-10-CM | POA: Diagnosis present

## 2011-08-13 DIAGNOSIS — Z09 Encounter for follow-up examination after completed treatment for conditions other than malignant neoplasm: Secondary | ICD-10-CM | POA: Diagnosis not present

## 2011-08-20 DIAGNOSIS — R197 Diarrhea, unspecified: Secondary | ICD-10-CM | POA: Diagnosis not present

## 2011-09-03 DIAGNOSIS — E669 Obesity, unspecified: Secondary | ICD-10-CM | POA: Diagnosis not present

## 2011-09-04 ENCOUNTER — Ambulatory Visit: Payer: Medicare Other | Admitting: Gynecology

## 2011-09-05 DIAGNOSIS — G8918 Other acute postprocedural pain: Secondary | ICD-10-CM | POA: Diagnosis not present

## 2011-09-05 DIAGNOSIS — M531 Cervicobrachial syndrome: Secondary | ICD-10-CM | POA: Diagnosis not present

## 2011-09-05 DIAGNOSIS — M545 Low back pain, unspecified: Secondary | ICD-10-CM | POA: Diagnosis not present

## 2011-10-01 DIAGNOSIS — E039 Hypothyroidism, unspecified: Secondary | ICD-10-CM | POA: Diagnosis not present

## 2011-10-01 DIAGNOSIS — E559 Vitamin D deficiency, unspecified: Secondary | ICD-10-CM | POA: Diagnosis not present

## 2011-10-01 DIAGNOSIS — E669 Obesity, unspecified: Secondary | ICD-10-CM | POA: Diagnosis not present

## 2011-10-03 DIAGNOSIS — R51 Headache: Secondary | ICD-10-CM | POA: Diagnosis not present

## 2011-10-03 DIAGNOSIS — IMO0002 Reserved for concepts with insufficient information to code with codable children: Secondary | ICD-10-CM | POA: Diagnosis not present

## 2011-10-03 DIAGNOSIS — M5126 Other intervertebral disc displacement, lumbar region: Secondary | ICD-10-CM | POA: Diagnosis not present

## 2011-11-04 DIAGNOSIS — E039 Hypothyroidism, unspecified: Secondary | ICD-10-CM | POA: Diagnosis not present

## 2011-11-04 DIAGNOSIS — Q054 Unspecified spina bifida with hydrocephalus: Secondary | ICD-10-CM | POA: Diagnosis not present

## 2011-11-04 DIAGNOSIS — E669 Obesity, unspecified: Secondary | ICD-10-CM | POA: Diagnosis not present

## 2011-11-06 DIAGNOSIS — M542 Cervicalgia: Secondary | ICD-10-CM | POA: Diagnosis not present

## 2011-11-06 DIAGNOSIS — Z09 Encounter for follow-up examination after completed treatment for conditions other than malignant neoplasm: Secondary | ICD-10-CM | POA: Diagnosis not present

## 2011-11-06 DIAGNOSIS — Z91018 Allergy to other foods: Secondary | ICD-10-CM | POA: Diagnosis not present

## 2011-11-06 DIAGNOSIS — Z9089 Acquired absence of other organs: Secondary | ICD-10-CM | POA: Diagnosis not present

## 2011-11-06 DIAGNOSIS — G4730 Sleep apnea, unspecified: Secondary | ICD-10-CM | POA: Diagnosis not present

## 2011-11-06 DIAGNOSIS — G44219 Episodic tension-type headache, not intractable: Secondary | ICD-10-CM | POA: Diagnosis not present

## 2011-11-06 DIAGNOSIS — M5412 Radiculopathy, cervical region: Secondary | ICD-10-CM | POA: Diagnosis not present

## 2011-11-06 DIAGNOSIS — G4489 Other headache syndrome: Secondary | ICD-10-CM | POA: Diagnosis not present

## 2011-11-06 DIAGNOSIS — G43009 Migraine without aura, not intractable, without status migrainosus: Secondary | ICD-10-CM | POA: Diagnosis not present

## 2011-11-06 DIAGNOSIS — Z888 Allergy status to other drugs, medicaments and biological substances status: Secondary | ICD-10-CM | POA: Diagnosis not present

## 2011-11-06 DIAGNOSIS — Z981 Arthrodesis status: Secondary | ICD-10-CM | POA: Diagnosis not present

## 2011-11-06 DIAGNOSIS — J45909 Unspecified asthma, uncomplicated: Secondary | ICD-10-CM | POA: Diagnosis not present

## 2011-11-06 DIAGNOSIS — Z79899 Other long term (current) drug therapy: Secondary | ICD-10-CM | POA: Diagnosis not present

## 2011-11-07 DIAGNOSIS — G8918 Other acute postprocedural pain: Secondary | ICD-10-CM | POA: Diagnosis not present

## 2011-11-07 DIAGNOSIS — M62838 Other muscle spasm: Secondary | ICD-10-CM | POA: Diagnosis not present

## 2011-12-03 ENCOUNTER — Ambulatory Visit: Payer: Medicare Other | Admitting: Gynecology

## 2011-12-05 ENCOUNTER — Encounter: Payer: Self-pay | Admitting: Gynecology

## 2011-12-05 ENCOUNTER — Ambulatory Visit (INDEPENDENT_AMBULATORY_CARE_PROVIDER_SITE_OTHER): Payer: Medicare Other | Admitting: Gynecology

## 2011-12-05 DIAGNOSIS — T839XXA Unspecified complication of genitourinary prosthetic device, implant and graft, initial encounter: Secondary | ICD-10-CM

## 2011-12-05 DIAGNOSIS — Z30432 Encounter for removal of intrauterine contraceptive device: Secondary | ICD-10-CM

## 2011-12-05 NOTE — Progress Notes (Signed)
Patient presents with history of having had Mirena IUD placed August 2012. She had it removed September 2012 due to side effects of bloating weight gain breast tenderness. Subsequently had ParaGard IUD placed May 2013 presents to have it removed because of behavior periods and hair loss. She notes a generalized thinning of her hair. No skin/weight changes. She also notes her periods become heavier and she wants to be removed.before removing IUD I reviewed with her contraceptive options to include barrier, pill patch ring, Implanon, IUD, sterilization post vasectomy and tubal. She does have a history of Arnold-Chiari deformity repair as well as a brain tumor that they're observing. She has smoked over a number of years although quit 2009. The risks of thrombosis with estrogen-containing contraception given this history were reviewed.  Exam with Fleet Contras Asst. Hair without overt alopecia or significant thinning. Abdomen soft nontender without masses guarding rebound organomegaly. Pelvic external BUS vagina normal. Cervix normal with IUD string visualized. Uterus normal size midline mobile nontender. Adnexa without masses or tenderness.  Procedure: IUD string was visualized and grasped with a Bozeman forceps and the ParaGard IUD was removed. It was shown to the patient and discarded.  Assessment and plan: Contraceptive management. Having heavier periods with IUD. The hair loss issue I reviewed with her not well known with ParaGard IUD. She apparently has red blotches on the Internet that reported this. Regardless she wanted it removed because of the heavier periods. She cannot remember when her last exam was about to schedule an annual exam we'll plan some baseline blood work at that point to include a thyroid screen and she agrees to schedule her annual exam. Given her total picture of strongly suggested that she pursue vasectomy for contraception and she will discuss this at home.

## 2011-12-05 NOTE — Patient Instructions (Signed)
Follow up for annual exam as scheduled. Use barrier contraception at present until final contraceptive decision made.

## 2011-12-09 DIAGNOSIS — R51 Headache: Secondary | ICD-10-CM | POA: Diagnosis not present

## 2011-12-09 DIAGNOSIS — F4323 Adjustment disorder with mixed anxiety and depressed mood: Secondary | ICD-10-CM | POA: Diagnosis not present

## 2011-12-09 DIAGNOSIS — M79609 Pain in unspecified limb: Secondary | ICD-10-CM | POA: Diagnosis not present

## 2011-12-11 DIAGNOSIS — L659 Nonscarring hair loss, unspecified: Secondary | ICD-10-CM | POA: Diagnosis not present

## 2011-12-11 DIAGNOSIS — L299 Pruritus, unspecified: Secondary | ICD-10-CM | POA: Diagnosis not present

## 2011-12-11 DIAGNOSIS — E039 Hypothyroidism, unspecified: Secondary | ICD-10-CM | POA: Diagnosis not present

## 2011-12-11 DIAGNOSIS — F411 Generalized anxiety disorder: Secondary | ICD-10-CM | POA: Diagnosis not present

## 2011-12-11 DIAGNOSIS — E559 Vitamin D deficiency, unspecified: Secondary | ICD-10-CM | POA: Diagnosis not present

## 2011-12-15 DIAGNOSIS — L719 Rosacea, unspecified: Secondary | ICD-10-CM | POA: Diagnosis not present

## 2011-12-15 DIAGNOSIS — L659 Nonscarring hair loss, unspecified: Secondary | ICD-10-CM | POA: Diagnosis not present

## 2011-12-15 DIAGNOSIS — L650 Telogen effluvium: Secondary | ICD-10-CM | POA: Diagnosis not present

## 2011-12-16 ENCOUNTER — Other Ambulatory Visit (HOSPITAL_COMMUNITY)
Admission: RE | Admit: 2011-12-16 | Discharge: 2011-12-16 | Disposition: A | Discharge status: Home / Self Care | Payer: Medicare Other | Source: Ambulatory Visit | Attending: Gynecology | Admitting: Gynecology

## 2011-12-16 ENCOUNTER — Encounter: Payer: Self-pay | Admitting: Gynecology

## 2011-12-16 ENCOUNTER — Ambulatory Visit (INDEPENDENT_AMBULATORY_CARE_PROVIDER_SITE_OTHER): Payer: Medicare Other | Admitting: Gynecology

## 2011-12-16 VITALS — BP 114/70 | Ht 67.0 in | Wt 183.0 lb

## 2011-12-16 DIAGNOSIS — Z124 Encounter for screening for malignant neoplasm of cervix: Secondary | ICD-10-CM | POA: Insufficient documentation

## 2011-12-16 DIAGNOSIS — Z3009 Encounter for other general counseling and advice on contraception: Secondary | ICD-10-CM | POA: Diagnosis not present

## 2011-12-16 DIAGNOSIS — Z01419 Encounter for gynecological examination (general) (routine) without abnormal findings: Secondary | ICD-10-CM | POA: Diagnosis not present

## 2011-12-16 DIAGNOSIS — Z1151 Encounter for screening for human papillomavirus (HPV): Secondary | ICD-10-CM | POA: Diagnosis not present

## 2011-12-16 NOTE — Patient Instructions (Signed)
Follow up in one year or sooner to discuss birth control

## 2011-12-16 NOTE — Addendum Note (Signed)
Addended by: Leonard Schwartz A on: 12/16/2011 11:42 AM   Modules accepted: Orders

## 2011-12-16 NOTE — Progress Notes (Signed)
April Meyer 1966/03/07 [id number]        46 y.o.  G2P1011 for annual exam.  Several issues below.  Past medical history,surgical history, medications, allergies, family history and social history were all reviewed and documented in the EPIC chart. ROS:  Was performed and pertinent positives and negatives are included in the history.  Exam: Fleet Contras assistant Filed Vitals:   12/16/11 1051  BP: 114/70  Height: 5\' 7"  (1.702 m)  Weight: 183 lb (83.008 kg)   General appearance  Normal Skin grossly normal Head/Neck normal with no cervical or supraclavicular adenopathy thyroid normal Lungs  clear Cardiac RR, without RMG Abdominal  soft, nontender, without masses, organomegaly or hernia Breasts  examined lying and sitting without masses, retractions, discharge or axillary adenopathy. Pelvic  Ext/BUS/vagina  normal   Cervix  normal Pap/HPV  Uterus  Axial to retroverted, normal size, shape and contour, midline and mobile nontender   Adnexa  Without masses or tenderness    Anus and perineum  normal   Rectovaginal  normal sphincter tone without palpated masses or tenderness.    Assessment/Plan:  46 y.o. G48P1011 female for annual exam.   1. Contraceptive management. Patient recently had her ParaGard IUD removed and has done well since then. She is not sexually active and does not plan to be any time soon. We discussed contraceptive options and she declines anything at this point. She'll follow up if she decides to become sexually active and wants to discuss contraception. 2. Mammography. Patient is up to date with annual mammography. SBE monthly reviewed.  3. Pap smear. Pap/HPV done today. No history of significant abnormalities in the past. If negative plan 5 year screening. 4. Health maintenance. Patient relates recent office visit with her primary physician with routine blood work. No blood work done today. Follow up one year, sooner for contraceptive management if becomes sexually  active.    [name], 11:25 AM 12/16/2011

## 2011-12-17 LAB — URINALYSIS W MICROSCOPIC + REFLEX CULTURE
Bilirubin Urine: NEGATIVE
Casts: NONE SEEN
Glucose, UA: NEGATIVE mg/dL
Leukocytes, UA: NEGATIVE
Protein, ur: NEGATIVE mg/dL
pH: 7 (ref 5.0–8.0)

## 2011-12-25 DIAGNOSIS — L659 Nonscarring hair loss, unspecified: Secondary | ICD-10-CM | POA: Diagnosis not present

## 2011-12-25 DIAGNOSIS — F411 Generalized anxiety disorder: Secondary | ICD-10-CM | POA: Diagnosis not present

## 2012-01-09 DIAGNOSIS — M542 Cervicalgia: Secondary | ICD-10-CM | POA: Diagnosis not present

## 2012-01-09 DIAGNOSIS — G4489 Other headache syndrome: Secondary | ICD-10-CM | POA: Diagnosis not present

## 2012-01-09 DIAGNOSIS — G44219 Episodic tension-type headache, not intractable: Secondary | ICD-10-CM | POA: Diagnosis not present

## 2012-01-09 DIAGNOSIS — G43019 Migraine without aura, intractable, without status migrainosus: Secondary | ICD-10-CM | POA: Diagnosis not present

## 2012-01-12 DIAGNOSIS — G8928 Other chronic postprocedural pain: Secondary | ICD-10-CM | POA: Diagnosis not present

## 2012-01-12 DIAGNOSIS — IMO0002 Reserved for concepts with insufficient information to code with codable children: Secondary | ICD-10-CM | POA: Diagnosis not present

## 2012-01-12 DIAGNOSIS — R51 Headache: Secondary | ICD-10-CM | POA: Diagnosis not present

## 2012-01-12 DIAGNOSIS — G479 Sleep disorder, unspecified: Secondary | ICD-10-CM | POA: Diagnosis not present

## 2012-01-27 DIAGNOSIS — J019 Acute sinusitis, unspecified: Secondary | ICD-10-CM | POA: Diagnosis not present

## 2012-01-27 DIAGNOSIS — J029 Acute pharyngitis, unspecified: Secondary | ICD-10-CM | POA: Diagnosis not present

## 2012-01-27 DIAGNOSIS — E039 Hypothyroidism, unspecified: Secondary | ICD-10-CM | POA: Diagnosis not present

## 2012-01-28 DIAGNOSIS — L659 Nonscarring hair loss, unspecified: Secondary | ICD-10-CM | POA: Diagnosis not present

## 2012-01-28 DIAGNOSIS — IMO0001 Reserved for inherently not codable concepts without codable children: Secondary | ICD-10-CM | POA: Diagnosis not present

## 2012-01-28 DIAGNOSIS — F411 Generalized anxiety disorder: Secondary | ICD-10-CM | POA: Diagnosis not present

## 2012-01-28 DIAGNOSIS — H6690 Otitis media, unspecified, unspecified ear: Secondary | ICD-10-CM | POA: Diagnosis not present

## 2012-02-16 DIAGNOSIS — M531 Cervicobrachial syndrome: Secondary | ICD-10-CM | POA: Diagnosis not present

## 2012-02-16 DIAGNOSIS — F4323 Adjustment disorder with mixed anxiety and depressed mood: Secondary | ICD-10-CM | POA: Diagnosis not present

## 2012-02-16 DIAGNOSIS — Z09 Encounter for follow-up examination after completed treatment for conditions other than malignant neoplasm: Secondary | ICD-10-CM | POA: Diagnosis not present

## 2012-02-16 DIAGNOSIS — D249 Benign neoplasm of unspecified breast: Secondary | ICD-10-CM | POA: Diagnosis not present

## 2012-02-16 DIAGNOSIS — G8918 Other acute postprocedural pain: Secondary | ICD-10-CM | POA: Diagnosis not present

## 2012-02-16 DIAGNOSIS — IMO0002 Reserved for concepts with insufficient information to code with codable children: Secondary | ICD-10-CM | POA: Diagnosis not present

## 2012-02-20 DIAGNOSIS — H9209 Otalgia, unspecified ear: Secondary | ICD-10-CM | POA: Diagnosis not present

## 2012-02-21 ENCOUNTER — Emergency Department (HOSPITAL_COMMUNITY): Payer: Medicare Other

## 2012-02-21 ENCOUNTER — Encounter (HOSPITAL_COMMUNITY): Payer: Self-pay | Admitting: Emergency Medicine

## 2012-02-21 ENCOUNTER — Emergency Department (HOSPITAL_COMMUNITY)
Admission: EM | Admit: 2012-02-21 | Discharge: 2012-02-21 | Disposition: A | Discharge status: Home / Self Care | Payer: Medicare Other | Attending: Emergency Medicine | Admitting: Emergency Medicine

## 2012-02-21 DIAGNOSIS — Z85841 Personal history of malignant neoplasm of brain: Secondary | ICD-10-CM | POA: Insufficient documentation

## 2012-02-21 DIAGNOSIS — Z79899 Other long term (current) drug therapy: Secondary | ICD-10-CM | POA: Diagnosis not present

## 2012-02-21 DIAGNOSIS — R42 Dizziness and giddiness: Secondary | ICD-10-CM | POA: Diagnosis not present

## 2012-02-21 DIAGNOSIS — E039 Hypothyroidism, unspecified: Secondary | ICD-10-CM | POA: Diagnosis not present

## 2012-02-21 DIAGNOSIS — Z8619 Personal history of other infectious and parasitic diseases: Secondary | ICD-10-CM | POA: Insufficient documentation

## 2012-02-21 DIAGNOSIS — Z87891 Personal history of nicotine dependence: Secondary | ICD-10-CM | POA: Insufficient documentation

## 2012-02-21 DIAGNOSIS — Z87728 Personal history of other specified (corrected) congenital malformations of nervous system and sense organs: Secondary | ICD-10-CM | POA: Insufficient documentation

## 2012-02-21 DIAGNOSIS — H9209 Otalgia, unspecified ear: Secondary | ICD-10-CM | POA: Diagnosis not present

## 2012-02-21 LAB — CBC WITH DIFFERENTIAL/PLATELET
Basophils Absolute: 0 10*3/uL (ref 0.0–0.1)
Basophils Relative: 0 % (ref 0–1)
Eosinophils Absolute: 0.2 10*3/uL (ref 0.0–0.7)
Eosinophils Relative: 2 % (ref 0–5)
Lymphs Abs: 3.1 10*3/uL (ref 0.7–4.0)
MCH: 29.8 pg (ref 26.0–34.0)
MCHC: 33.7 g/dL (ref 30.0–36.0)
MCV: 88.4 fL (ref 78.0–100.0)
Neutrophils Relative %: 48 % (ref 43–77)
Platelets: 217 10*3/uL (ref 150–400)
RBC: 3.79 MIL/uL — ABNORMAL LOW (ref 3.87–5.11)
RDW: 12.5 % (ref 11.5–15.5)

## 2012-02-21 MED ORDER — HYDROMORPHONE HCL 2 MG PO TABS: 2.0000 mg | ORAL_TABLET | Freq: Four times a day (QID) | ORAL | Status: DC | PRN

## 2012-02-21 MED ORDER — DIAZEPAM 5 MG PO TABS: 5.0000 mg | ORAL_TABLET | Freq: Two times a day (BID) | ORAL | Status: DC

## 2012-02-21 MED ORDER — HYDROMORPHONE HCL PF 1 MG/ML IJ SOLN
1.0000 mg | Freq: Once | INTRAMUSCULAR | Status: AC
  Administered 2012-02-21: 1 mg via INTRAMUSCULAR
  Filled 2012-02-21: qty 1

## 2012-02-21 MED ORDER — IOHEXOL 300 MG/ML  SOLN
100.0000 mL | Freq: Once | INTRAMUSCULAR | Status: AC | PRN
  Administered 2012-02-21: 100 mL via INTRAVENOUS

## 2012-02-21 NOTE — ED Notes (Signed)
Pt w/ hx of chiaria malformation repair, recently treated for right ear infection and completed 10 days of antibx. Now pt has severe pain behind right ear, seen by PCP and told she had mastoditis and needed to have a CT. Pt expected to have CT yesterday but somehow the order never made it across the network. Spoke w/ PCP today who told her to come to ED

## 2012-02-21 NOTE — ED Notes (Signed)
RN to obtain labs with start of IV 

## 2012-02-21 NOTE — ED Notes (Signed)
Patient transported to CT 

## 2012-02-21 NOTE — ED Provider Notes (Signed)
History    CSN: [id number] Arrival date & time 02/21/12  1640 First MD Initiated Contact with Patient 02/21/12 1709      Chief Complaint  Patient presents with  . Dizziness    HPI Pt has been having pain in her right ear for the last 15 days.  SHe had been on augmentin for 10 days but the symptoms restarted agagin. She saw a pa yesterday at the office and was supposed to have had a CT scan of the mastoid region.  For some reason the test did not get ordered.  Pt states she has soreness around her right ear.  She has not had any nausea or vomiting.  She still is having pain around the right ear and mastoid region. Past Medical History  Diagnosis Date  . Arnold-Chiari deformity   . Brain tumor   . HSV infection   . Hypothyroidism     Past Surgical History  Procedure Date  . Arnold chiari repair   . Dilation and curettage of uterus   . Cholecystectomy   . Tonsillectomy and adenoidectomy   . Intrauterine device insertion 10/2010    Mirena  . Iud removal 11/2010    could not tolerate hormonal side effects  . Neck fusion c1-c3   . Brain surgery     Family History  Problem Relation Age of Onset  . Diabetes Maternal Aunt   . Breast cancer Maternal Aunt   . Diabetes Maternal Uncle   . Breast cancer Paternal Aunt   . Diabetes Maternal Grandmother   . Cancer Maternal Grandmother     SKIN AND LUNG  . Diabetes Paternal Grandmother   . Breast cancer Maternal Aunt   . Breast cancer Maternal Aunt     History  Substance Use Topics  . Smoking status: Former Smoker    Quit date: 09/09/2007  . Smokeless tobacco: Never Used  . Alcohol Use: No     Comment: OCCASSIONAL    OB History    Grav Para Term Preterm Abortions TAB SAB Ect Mult Living   2 1 1  1   1  1       Review of Systems  All other systems reviewed and are negative.    Allergies  Prednisone  Home Medications   Current Outpatient Rx  Name  Route  Sig  Dispense  Refill  . ALPRAZOLAM 0.25 MG PO TABS    Oral   Take 0.25 mg by mouth at bedtime as needed.         . ALPRAZOLAM 0.5 MG PO TABS   Oral   Take 0.5 mg by mouth 3 (three) times daily as needed. Anxiety         . AMOXICILLIN-POT CLAVULANATE 875-125 MG PO TABS   Oral   Take 1 tablet by mouth 2 (two) times daily. For 10 days. Finished it when 02-15-12         . VITAMIN C 1000 MG PO TABS   Oral   Take 1,000 mg by mouth daily.         . B COMPLEX PO TABS   Oral   Take 1 tablet by mouth daily.         Marland Kitchen BIOTIN 5 MG PO TABS   Oral   Take 1 tablet by mouth daily.         Marland Kitchen GABAPENTIN 300 MG PO CAPS   Oral   Take 300 mg by mouth 4 (four) times daily.         Marland Kitchen  SYNTHROID PO   Oral   Take 112 mcg by mouth daily.          Marland Kitchen PRILOSEC OTC PO   Oral   Take by mouth.           . OXYCODONE HCL 10 MG PO TABS   Oral   Take 10 mg by mouth See admin instructions. 5 times daily         . PROMETHAZINE HCL 25 MG PO TABS   Oral   Take 25 mg by mouth every 6 (six) hours as needed. Nausea           BP 113/77  Pulse 62  Temp 98.8 F (37.1 C) (Oral)  Resp 14  SpO2 100%  LMP 02/08/2012  Physical Exam  Nursing note and vitals reviewed. Constitutional: She appears well-developed and well-nourished. No distress.  HENT:  Head: Normocephalic and atraumatic.  Right Ear: There is tenderness. No swelling. There is mastoid tenderness. Tympanic membrane is injected. Tympanic membrane is not retracted and not bulging. No middle ear effusion. No hemotympanum.  Left Ear: External ear normal. No swelling or tenderness. No mastoid tenderness. Tympanic membrane is not injected, not retracted and not bulging.  No middle ear effusion. No hemotympanum.  Mouth/Throat: No oropharyngeal exudate.  Eyes: Conjunctivae normal are normal. Right eye exhibits no discharge. Left eye exhibits no discharge. No scleral icterus.  Neck: Neck supple. No tracheal deviation present.  Cardiovascular: Normal rate.   Pulmonary/Chest: Effort  normal. No stridor. No respiratory distress.  Musculoskeletal: She exhibits no edema.  Neurological: She is alert. Cranial nerve deficit: no gross deficits.  Skin: Skin is warm and dry. No rash noted.  Psychiatric: She has a normal mood and affect.    ED Course  Procedures (including critical care time)  Labs Reviewed  CBC WITH DIFFERENTIAL - Abnormal; Notable for the following:    RBC 3.79 (*)     Hemoglobin 11.3 (*)     HCT 33.5 (*)     All other components within normal limits   Ct Temporal Bones W/cm  02/21/2012  *RADIOLOGY REPORT*  Clinical Data: Dizziness.  Right temporal pain.  CT TEMPORAL BONES WITH CONTRAST  Technique:  Axial and coronal plane CT imaging of the petrous temporal bones was performed with thin-collimation image reconstruction after intravenous contrast administration. Multiplanar CT image reconstructions were also generated.  Contrast: OMNIPAQUE IOHEXOL 300 MG/ML  SOLN the  Comparison: None.  Findings: There is no evidence for external ear canal fluid or blockage, middle ear fluid, mastoid fluid or coalescence, or inner ear abnormality on the right or left.  No congenital anomalies are detected.  The patient is status post suboccipital decompression for Chiari I malformation.  Although the surgical construct is incompletely evaluated, there is no visible intraspinal compression.  Hardware grossly intact.  Screws at  C1 breach the foramen transversarium bilaterally.  Both vertebrals enhance normally.  No visible intracranial abnormality or chronic infarction. There is mild chronic bilateral ethmoid disease.  The orbits appear negative.  There is a tubular defect in the right transverse and sigmoid sinus which I believe represents unopacified blood.  It is difficult to completely exclude a nonocclusive thrombus however.  Correlate clinically.  IMPRESSION: No temporal bone pathology is identified.  The patient is status post suboccipital decompression for Chiari  malformation.  The construct is incompletely evaluated but there is no definite intraspinal compression.  Tubular filling defect within the right transverse sinus and sigmoid sinus likely flow  defect although difficult to exclude nonocclusive thrombus.   Original Report Authenticated By: Davonna Belling, M.D.      1. Ear pain       MDM  No sign of mastoiditis on exam.  No om.  CT scan without acute findings.  Questionable findings likely artifact.  Discussed case with Dr Cloria Spring.  Pt unlikely to have sinus thrombosis without mastoiditis or otitis media.  Pt does have ttp in that region.  ?torticollis or TMJ.  Not likely related to her chiari malformation repair.  Discussed pain management and close follow up as outpatient.        Celene Kras, MD 02/21/12 2022

## 2012-02-21 NOTE — ED Notes (Addendum)
Family/ Friend at bedside, pt c/o pain behind rt ear, gradually worsening today. Was brought in by friend. C/o dizziness and  rt side of neck/ear pain.

## 2012-02-21 NOTE — ED Notes (Signed)
Took pt ice pack

## 2012-03-15 DIAGNOSIS — M531 Cervicobrachial syndrome: Secondary | ICD-10-CM | POA: Diagnosis not present

## 2012-03-15 DIAGNOSIS — M62838 Other muscle spasm: Secondary | ICD-10-CM | POA: Diagnosis not present

## 2012-03-15 DIAGNOSIS — M542 Cervicalgia: Secondary | ICD-10-CM | POA: Diagnosis not present

## 2012-03-15 DIAGNOSIS — G4489 Other headache syndrome: Secondary | ICD-10-CM | POA: Diagnosis not present

## 2012-03-15 DIAGNOSIS — M79609 Pain in unspecified limb: Secondary | ICD-10-CM | POA: Diagnosis not present

## 2012-03-15 DIAGNOSIS — R51 Headache: Secondary | ICD-10-CM | POA: Diagnosis not present

## 2012-03-15 DIAGNOSIS — G930 Cerebral cysts: Secondary | ICD-10-CM | POA: Diagnosis not present

## 2012-03-16 DIAGNOSIS — H9209 Otalgia, unspecified ear: Secondary | ICD-10-CM | POA: Diagnosis not present

## 2012-03-16 DIAGNOSIS — I820 Budd-Chiari syndrome: Secondary | ICD-10-CM | POA: Diagnosis not present

## 2012-03-18 ENCOUNTER — Other Ambulatory Visit: Payer: Self-pay | Admitting: Neurology

## 2012-03-18 DIAGNOSIS — G939 Disorder of brain, unspecified: Secondary | ICD-10-CM

## 2012-03-23 DIAGNOSIS — IMO0001 Reserved for inherently not codable concepts without codable children: Secondary | ICD-10-CM | POA: Diagnosis not present

## 2012-03-23 DIAGNOSIS — M542 Cervicalgia: Secondary | ICD-10-CM | POA: Diagnosis not present

## 2012-03-26 DIAGNOSIS — IMO0001 Reserved for inherently not codable concepts without codable children: Secondary | ICD-10-CM | POA: Diagnosis not present

## 2012-03-26 DIAGNOSIS — M542 Cervicalgia: Secondary | ICD-10-CM | POA: Diagnosis not present

## 2012-03-30 ENCOUNTER — Ambulatory Visit
Admission: RE | Admit: 2012-03-30 | Discharge: 2012-03-30 | Disposition: A | Discharge status: Home / Self Care | Payer: Medicare Other | Source: Ambulatory Visit | Attending: Neurology | Admitting: Neurology

## 2012-03-30 DIAGNOSIS — G939 Disorder of brain, unspecified: Secondary | ICD-10-CM

## 2012-03-30 DIAGNOSIS — IMO0001 Reserved for inherently not codable concepts without codable children: Secondary | ICD-10-CM | POA: Diagnosis not present

## 2012-03-30 DIAGNOSIS — M542 Cervicalgia: Secondary | ICD-10-CM | POA: Diagnosis not present

## 2012-03-30 DIAGNOSIS — R51 Headache: Secondary | ICD-10-CM | POA: Diagnosis not present

## 2012-03-30 MED ORDER — GADOBENATE DIMEGLUMINE 529 MG/ML IV SOLN
18.0000 mL | Freq: Once | INTRAVENOUS | Status: AC | PRN
  Administered 2012-03-30: 18 mL via INTRAVENOUS

## 2012-03-31 ENCOUNTER — Other Ambulatory Visit: Payer: Medicare Other

## 2012-04-02 DIAGNOSIS — M542 Cervicalgia: Secondary | ICD-10-CM | POA: Diagnosis not present

## 2012-04-02 DIAGNOSIS — IMO0001 Reserved for inherently not codable concepts without codable children: Secondary | ICD-10-CM | POA: Diagnosis not present

## 2012-04-06 DIAGNOSIS — I676 Nonpyogenic thrombosis of intracranial venous system: Secondary | ICD-10-CM | POA: Diagnosis not present

## 2012-04-06 DIAGNOSIS — M542 Cervicalgia: Secondary | ICD-10-CM | POA: Diagnosis not present

## 2012-04-06 DIAGNOSIS — IMO0001 Reserved for inherently not codable concepts without codable children: Secondary | ICD-10-CM | POA: Diagnosis not present

## 2012-04-09 DIAGNOSIS — M542 Cervicalgia: Secondary | ICD-10-CM | POA: Diagnosis not present

## 2012-04-09 DIAGNOSIS — IMO0001 Reserved for inherently not codable concepts without codable children: Secondary | ICD-10-CM | POA: Diagnosis not present

## 2012-04-13 DIAGNOSIS — M25529 Pain in unspecified elbow: Secondary | ICD-10-CM | POA: Diagnosis not present

## 2012-04-13 DIAGNOSIS — M531 Cervicobrachial syndrome: Secondary | ICD-10-CM | POA: Diagnosis not present

## 2012-04-13 DIAGNOSIS — M542 Cervicalgia: Secondary | ICD-10-CM | POA: Diagnosis not present

## 2012-04-13 DIAGNOSIS — M79609 Pain in unspecified limb: Secondary | ICD-10-CM | POA: Diagnosis not present

## 2012-04-13 DIAGNOSIS — IMO0001 Reserved for inherently not codable concepts without codable children: Secondary | ICD-10-CM | POA: Diagnosis not present

## 2012-04-16 DIAGNOSIS — IMO0001 Reserved for inherently not codable concepts without codable children: Secondary | ICD-10-CM | POA: Diagnosis not present

## 2012-04-16 DIAGNOSIS — M542 Cervicalgia: Secondary | ICD-10-CM | POA: Diagnosis not present

## 2012-04-20 DIAGNOSIS — M542 Cervicalgia: Secondary | ICD-10-CM | POA: Diagnosis not present

## 2012-04-20 DIAGNOSIS — IMO0001 Reserved for inherently not codable concepts without codable children: Secondary | ICD-10-CM | POA: Diagnosis not present

## 2012-04-27 DIAGNOSIS — M542 Cervicalgia: Secondary | ICD-10-CM | POA: Diagnosis not present

## 2012-04-27 DIAGNOSIS — IMO0001 Reserved for inherently not codable concepts without codable children: Secondary | ICD-10-CM | POA: Diagnosis not present

## 2012-05-02 DIAGNOSIS — IMO0001 Reserved for inherently not codable concepts without codable children: Secondary | ICD-10-CM | POA: Diagnosis not present

## 2012-05-02 DIAGNOSIS — M542 Cervicalgia: Secondary | ICD-10-CM | POA: Diagnosis not present

## 2012-05-04 DIAGNOSIS — I676 Nonpyogenic thrombosis of intracranial venous system: Secondary | ICD-10-CM | POA: Diagnosis not present

## 2012-05-04 DIAGNOSIS — IMO0001 Reserved for inherently not codable concepts without codable children: Secondary | ICD-10-CM | POA: Diagnosis not present

## 2012-05-04 DIAGNOSIS — M542 Cervicalgia: Secondary | ICD-10-CM | POA: Diagnosis not present

## 2012-05-04 DIAGNOSIS — M62838 Other muscle spasm: Secondary | ICD-10-CM | POA: Diagnosis not present

## 2012-05-04 DIAGNOSIS — G4489 Other headache syndrome: Secondary | ICD-10-CM | POA: Diagnosis not present

## 2012-05-06 DIAGNOSIS — IMO0001 Reserved for inherently not codable concepts without codable children: Secondary | ICD-10-CM | POA: Diagnosis not present

## 2012-05-06 DIAGNOSIS — M542 Cervicalgia: Secondary | ICD-10-CM | POA: Diagnosis not present

## 2012-05-11 DIAGNOSIS — IMO0001 Reserved for inherently not codable concepts without codable children: Secondary | ICD-10-CM | POA: Diagnosis not present

## 2012-05-11 DIAGNOSIS — M542 Cervicalgia: Secondary | ICD-10-CM | POA: Diagnosis not present

## 2012-05-12 DIAGNOSIS — M531 Cervicobrachial syndrome: Secondary | ICD-10-CM | POA: Diagnosis not present

## 2012-05-12 DIAGNOSIS — M79609 Pain in unspecified limb: Secondary | ICD-10-CM | POA: Diagnosis not present

## 2012-05-12 DIAGNOSIS — G8928 Other chronic postprocedural pain: Secondary | ICD-10-CM | POA: Diagnosis not present

## 2012-05-12 DIAGNOSIS — IMO0001 Reserved for inherently not codable concepts without codable children: Secondary | ICD-10-CM | POA: Diagnosis not present

## 2012-05-12 DIAGNOSIS — M542 Cervicalgia: Secondary | ICD-10-CM | POA: Diagnosis not present

## 2012-05-18 DIAGNOSIS — IMO0001 Reserved for inherently not codable concepts without codable children: Secondary | ICD-10-CM | POA: Diagnosis not present

## 2012-05-18 DIAGNOSIS — M542 Cervicalgia: Secondary | ICD-10-CM | POA: Diagnosis not present

## 2012-05-20 DIAGNOSIS — IMO0001 Reserved for inherently not codable concepts without codable children: Secondary | ICD-10-CM | POA: Diagnosis not present

## 2012-05-20 DIAGNOSIS — M542 Cervicalgia: Secondary | ICD-10-CM | POA: Diagnosis not present

## 2012-05-26 DIAGNOSIS — Q796 Ehlers-Danlos syndrome, unspecified: Secondary | ICD-10-CM | POA: Diagnosis not present

## 2012-05-26 DIAGNOSIS — G08 Intracranial and intraspinal phlebitis and thrombophlebitis: Secondary | ICD-10-CM | POA: Insufficient documentation

## 2012-05-26 DIAGNOSIS — D6859 Other primary thrombophilia: Secondary | ICD-10-CM | POA: Diagnosis not present

## 2012-05-26 DIAGNOSIS — M357 Hypermobility syndrome: Secondary | ICD-10-CM | POA: Diagnosis not present

## 2012-05-27 DIAGNOSIS — M542 Cervicalgia: Secondary | ICD-10-CM | POA: Diagnosis not present

## 2012-05-27 DIAGNOSIS — IMO0001 Reserved for inherently not codable concepts without codable children: Secondary | ICD-10-CM | POA: Diagnosis not present

## 2012-05-28 DIAGNOSIS — M542 Cervicalgia: Secondary | ICD-10-CM | POA: Diagnosis not present

## 2012-05-28 DIAGNOSIS — G08 Intracranial and intraspinal phlebitis and thrombophlebitis: Secondary | ICD-10-CM | POA: Diagnosis not present

## 2012-05-28 DIAGNOSIS — IMO0001 Reserved for inherently not codable concepts without codable children: Secondary | ICD-10-CM | POA: Diagnosis not present

## 2012-05-28 DIAGNOSIS — E039 Hypothyroidism, unspecified: Secondary | ICD-10-CM | POA: Diagnosis not present

## 2012-05-28 DIAGNOSIS — F909 Attention-deficit hyperactivity disorder, unspecified type: Secondary | ICD-10-CM | POA: Diagnosis not present

## 2012-05-28 DIAGNOSIS — Q054 Unspecified spina bifida with hydrocephalus: Secondary | ICD-10-CM | POA: Diagnosis not present

## 2012-06-02 DIAGNOSIS — G08 Intracranial and intraspinal phlebitis and thrombophlebitis: Secondary | ICD-10-CM | POA: Diagnosis not present

## 2012-06-02 DIAGNOSIS — M357 Hypermobility syndrome: Secondary | ICD-10-CM | POA: Diagnosis not present

## 2012-06-02 DIAGNOSIS — D6859 Other primary thrombophilia: Secondary | ICD-10-CM | POA: Diagnosis not present

## 2012-06-02 DIAGNOSIS — R220 Localized swelling, mass and lump, head: Secondary | ICD-10-CM | POA: Diagnosis not present

## 2012-06-02 DIAGNOSIS — R221 Localized swelling, mass and lump, neck: Secondary | ICD-10-CM | POA: Diagnosis not present

## 2012-06-09 DIAGNOSIS — G8928 Other chronic postprocedural pain: Secondary | ICD-10-CM | POA: Diagnosis not present

## 2012-06-09 DIAGNOSIS — M545 Low back pain, unspecified: Secondary | ICD-10-CM | POA: Diagnosis not present

## 2012-06-09 DIAGNOSIS — M531 Cervicobrachial syndrome: Secondary | ICD-10-CM | POA: Diagnosis not present

## 2012-06-11 DIAGNOSIS — IMO0001 Reserved for inherently not codable concepts without codable children: Secondary | ICD-10-CM | POA: Diagnosis not present

## 2012-06-11 DIAGNOSIS — M542 Cervicalgia: Secondary | ICD-10-CM | POA: Diagnosis not present

## 2012-06-15 DIAGNOSIS — T7840XA Allergy, unspecified, initial encounter: Secondary | ICD-10-CM | POA: Diagnosis not present

## 2012-06-15 DIAGNOSIS — J309 Allergic rhinitis, unspecified: Secondary | ICD-10-CM | POA: Diagnosis not present

## 2012-06-18 DIAGNOSIS — J301 Allergic rhinitis due to pollen: Secondary | ICD-10-CM | POA: Diagnosis not present

## 2012-06-24 DIAGNOSIS — K5900 Constipation, unspecified: Secondary | ICD-10-CM | POA: Diagnosis not present

## 2012-06-25 DIAGNOSIS — E039 Hypothyroidism, unspecified: Secondary | ICD-10-CM | POA: Diagnosis not present

## 2012-06-25 DIAGNOSIS — E559 Vitamin D deficiency, unspecified: Secondary | ICD-10-CM | POA: Diagnosis not present

## 2012-06-25 DIAGNOSIS — Z79899 Other long term (current) drug therapy: Secondary | ICD-10-CM | POA: Diagnosis not present

## 2012-07-01 DIAGNOSIS — J301 Allergic rhinitis due to pollen: Secondary | ICD-10-CM | POA: Diagnosis not present

## 2012-07-02 DIAGNOSIS — M542 Cervicalgia: Secondary | ICD-10-CM | POA: Diagnosis not present

## 2012-07-02 DIAGNOSIS — G43019 Migraine without aura, intractable, without status migrainosus: Secondary | ICD-10-CM | POA: Diagnosis not present

## 2012-07-02 DIAGNOSIS — I676 Nonpyogenic thrombosis of intracranial venous system: Secondary | ICD-10-CM | POA: Diagnosis not present

## 2012-07-02 DIAGNOSIS — G4489 Other headache syndrome: Secondary | ICD-10-CM | POA: Diagnosis not present

## 2012-07-06 DIAGNOSIS — M542 Cervicalgia: Secondary | ICD-10-CM | POA: Diagnosis not present

## 2012-07-06 DIAGNOSIS — M531 Cervicobrachial syndrome: Secondary | ICD-10-CM | POA: Diagnosis not present

## 2012-07-06 DIAGNOSIS — Z79899 Other long term (current) drug therapy: Secondary | ICD-10-CM | POA: Diagnosis not present

## 2012-07-15 ENCOUNTER — Other Ambulatory Visit: Payer: Self-pay | Admitting: Neurology

## 2012-07-15 DIAGNOSIS — G939 Disorder of brain, unspecified: Secondary | ICD-10-CM

## 2012-07-15 DIAGNOSIS — R519 Headache, unspecified: Secondary | ICD-10-CM

## 2012-07-19 DIAGNOSIS — J301 Allergic rhinitis due to pollen: Secondary | ICD-10-CM | POA: Diagnosis not present

## 2012-07-28 ENCOUNTER — Ambulatory Visit
Admission: RE | Admit: 2012-07-28 | Discharge: 2012-07-28 | Disposition: A | Discharge status: Home / Self Care | Payer: Medicare Other | Source: Ambulatory Visit | Attending: Neurology | Admitting: Neurology

## 2012-07-28 DIAGNOSIS — G939 Disorder of brain, unspecified: Secondary | ICD-10-CM

## 2012-07-28 DIAGNOSIS — R519 Headache, unspecified: Secondary | ICD-10-CM

## 2012-07-28 DIAGNOSIS — G08 Intracranial and intraspinal phlebitis and thrombophlebitis: Secondary | ICD-10-CM | POA: Diagnosis not present

## 2012-07-28 MED ORDER — GADOBENATE DIMEGLUMINE 529 MG/ML IV SOLN
19.0000 mL | Freq: Once | INTRAVENOUS | Status: AC | PRN
  Administered 2012-07-28: 19 mL via INTRAVENOUS

## 2012-07-30 DIAGNOSIS — E039 Hypothyroidism, unspecified: Secondary | ICD-10-CM | POA: Diagnosis not present

## 2012-07-30 DIAGNOSIS — R5381 Other malaise: Secondary | ICD-10-CM | POA: Diagnosis not present

## 2012-07-30 DIAGNOSIS — M949 Disorder of cartilage, unspecified: Secondary | ICD-10-CM | POA: Diagnosis not present

## 2012-07-30 DIAGNOSIS — M899 Disorder of bone, unspecified: Secondary | ICD-10-CM | POA: Diagnosis not present

## 2012-08-04 DIAGNOSIS — G5620 Lesion of ulnar nerve, unspecified upper limb: Secondary | ICD-10-CM | POA: Diagnosis not present

## 2012-08-04 DIAGNOSIS — M5412 Radiculopathy, cervical region: Secondary | ICD-10-CM | POA: Diagnosis not present

## 2012-08-18 DIAGNOSIS — J301 Allergic rhinitis due to pollen: Secondary | ICD-10-CM | POA: Diagnosis not present

## 2012-08-30 DIAGNOSIS — M5412 Radiculopathy, cervical region: Secondary | ICD-10-CM | POA: Diagnosis not present

## 2012-09-01 ENCOUNTER — Inpatient Hospital Stay
Admission: RE | Admit: 2012-09-01 | Discharge: 2012-09-01 | Disposition: A | Discharge status: Home / Self Care | Payer: Self-pay | Source: Ambulatory Visit | Attending: Neurology | Admitting: Neurology

## 2012-09-01 ENCOUNTER — Other Ambulatory Visit: Payer: Self-pay | Admitting: Neurology

## 2012-09-01 DIAGNOSIS — R519 Headache, unspecified: Secondary | ICD-10-CM

## 2012-09-06 DIAGNOSIS — G5620 Lesion of ulnar nerve, unspecified upper limb: Secondary | ICD-10-CM | POA: Diagnosis not present

## 2012-09-06 DIAGNOSIS — R209 Unspecified disturbances of skin sensation: Secondary | ICD-10-CM | POA: Diagnosis not present

## 2012-09-06 DIAGNOSIS — G4489 Other headache syndrome: Secondary | ICD-10-CM | POA: Diagnosis not present

## 2012-09-06 DIAGNOSIS — I676 Nonpyogenic thrombosis of intracranial venous system: Secondary | ICD-10-CM | POA: Diagnosis not present

## 2012-09-17 DIAGNOSIS — J301 Allergic rhinitis due to pollen: Secondary | ICD-10-CM | POA: Diagnosis not present

## 2012-09-17 DIAGNOSIS — IMO0001 Reserved for inherently not codable concepts without codable children: Secondary | ICD-10-CM | POA: Diagnosis not present

## 2012-09-17 DIAGNOSIS — M542 Cervicalgia: Secondary | ICD-10-CM | POA: Diagnosis not present

## 2012-09-20 DIAGNOSIS — IMO0001 Reserved for inherently not codable concepts without codable children: Secondary | ICD-10-CM | POA: Diagnosis not present

## 2012-09-20 DIAGNOSIS — M542 Cervicalgia: Secondary | ICD-10-CM | POA: Diagnosis not present

## 2012-09-21 DIAGNOSIS — E039 Hypothyroidism, unspecified: Secondary | ICD-10-CM | POA: Diagnosis not present

## 2012-09-23 ENCOUNTER — Ambulatory Visit: Payer: Self-pay | Admitting: Gynecology

## 2012-09-24 DIAGNOSIS — F909 Attention-deficit hyperactivity disorder, unspecified type: Secondary | ICD-10-CM | POA: Diagnosis not present

## 2012-09-27 DIAGNOSIS — G5620 Lesion of ulnar nerve, unspecified upper limb: Secondary | ICD-10-CM | POA: Diagnosis not present

## 2012-09-27 DIAGNOSIS — M545 Low back pain, unspecified: Secondary | ICD-10-CM | POA: Diagnosis not present

## 2012-09-27 DIAGNOSIS — R51 Headache: Secondary | ICD-10-CM | POA: Diagnosis not present

## 2012-09-27 DIAGNOSIS — M542 Cervicalgia: Secondary | ICD-10-CM | POA: Diagnosis not present

## 2012-09-28 DIAGNOSIS — M542 Cervicalgia: Secondary | ICD-10-CM | POA: Diagnosis not present

## 2012-09-28 DIAGNOSIS — IMO0001 Reserved for inherently not codable concepts without codable children: Secondary | ICD-10-CM | POA: Diagnosis not present

## 2012-10-05 DIAGNOSIS — M542 Cervicalgia: Secondary | ICD-10-CM | POA: Diagnosis not present

## 2012-10-05 DIAGNOSIS — IMO0001 Reserved for inherently not codable concepts without codable children: Secondary | ICD-10-CM | POA: Diagnosis not present

## 2012-11-01 DIAGNOSIS — Z79899 Other long term (current) drug therapy: Secondary | ICD-10-CM | POA: Diagnosis not present

## 2012-11-01 DIAGNOSIS — M542 Cervicalgia: Secondary | ICD-10-CM | POA: Diagnosis not present

## 2012-11-01 DIAGNOSIS — M531 Cervicobrachial syndrome: Secondary | ICD-10-CM | POA: Diagnosis not present

## 2012-11-01 DIAGNOSIS — M5412 Radiculopathy, cervical region: Secondary | ICD-10-CM | POA: Diagnosis not present

## 2012-11-02 DIAGNOSIS — G08 Intracranial and intraspinal phlebitis and thrombophlebitis: Secondary | ICD-10-CM | POA: Diagnosis not present

## 2012-11-02 DIAGNOSIS — Z87891 Personal history of nicotine dependence: Secondary | ICD-10-CM | POA: Diagnosis not present

## 2012-11-02 DIAGNOSIS — E039 Hypothyroidism, unspecified: Secondary | ICD-10-CM | POA: Diagnosis not present

## 2012-11-02 DIAGNOSIS — M357 Hypermobility syndrome: Secondary | ICD-10-CM | POA: Diagnosis not present

## 2012-11-12 DIAGNOSIS — M542 Cervicalgia: Secondary | ICD-10-CM | POA: Diagnosis not present

## 2012-11-12 DIAGNOSIS — G4489 Other headache syndrome: Secondary | ICD-10-CM | POA: Diagnosis not present

## 2012-11-12 DIAGNOSIS — M62838 Other muscle spasm: Secondary | ICD-10-CM | POA: Diagnosis not present

## 2012-11-12 DIAGNOSIS — I676 Nonpyogenic thrombosis of intracranial venous system: Secondary | ICD-10-CM | POA: Diagnosis not present

## 2012-11-16 DIAGNOSIS — L820 Inflamed seborrheic keratosis: Secondary | ICD-10-CM | POA: Diagnosis not present

## 2012-11-16 DIAGNOSIS — D2360 Other benign neoplasm of skin of unspecified upper limb, including shoulder: Secondary | ICD-10-CM | POA: Diagnosis not present

## 2012-11-16 DIAGNOSIS — D2330 Other benign neoplasm of skin of unspecified part of face: Secondary | ICD-10-CM | POA: Diagnosis not present

## 2012-11-16 DIAGNOSIS — L723 Sebaceous cyst: Secondary | ICD-10-CM | POA: Diagnosis not present

## 2012-11-16 DIAGNOSIS — D1801 Hemangioma of skin and subcutaneous tissue: Secondary | ICD-10-CM | POA: Diagnosis not present

## 2012-11-16 DIAGNOSIS — L821 Other seborrheic keratosis: Secondary | ICD-10-CM | POA: Diagnosis not present

## 2012-11-16 DIAGNOSIS — D239 Other benign neoplasm of skin, unspecified: Secondary | ICD-10-CM | POA: Diagnosis not present

## 2012-11-26 DIAGNOSIS — Z0000 Encounter for general adult medical examination without abnormal findings: Secondary | ICD-10-CM | POA: Diagnosis not present

## 2012-11-26 DIAGNOSIS — E039 Hypothyroidism, unspecified: Secondary | ICD-10-CM | POA: Diagnosis not present

## 2012-11-29 DIAGNOSIS — E039 Hypothyroidism, unspecified: Secondary | ICD-10-CM | POA: Diagnosis not present

## 2012-11-29 DIAGNOSIS — Z0000 Encounter for general adult medical examination without abnormal findings: Secondary | ICD-10-CM | POA: Diagnosis not present

## 2012-11-29 DIAGNOSIS — F411 Generalized anxiety disorder: Secondary | ICD-10-CM | POA: Diagnosis not present

## 2012-11-29 DIAGNOSIS — E559 Vitamin D deficiency, unspecified: Secondary | ICD-10-CM | POA: Diagnosis not present

## 2012-11-30 DIAGNOSIS — M2550 Pain in unspecified joint: Secondary | ICD-10-CM | POA: Diagnosis not present

## 2012-11-30 DIAGNOSIS — M542 Cervicalgia: Secondary | ICD-10-CM | POA: Diagnosis not present

## 2012-11-30 DIAGNOSIS — Q796 Ehlers-Danlos syndrome, unspecified: Secondary | ICD-10-CM | POA: Diagnosis not present

## 2012-12-01 DIAGNOSIS — E039 Hypothyroidism, unspecified: Secondary | ICD-10-CM | POA: Diagnosis not present

## 2012-12-01 DIAGNOSIS — F411 Generalized anxiety disorder: Secondary | ICD-10-CM | POA: Diagnosis not present

## 2012-12-01 DIAGNOSIS — F909 Attention-deficit hyperactivity disorder, unspecified type: Secondary | ICD-10-CM | POA: Diagnosis not present

## 2012-12-01 DIAGNOSIS — Q054 Unspecified spina bifida with hydrocephalus: Secondary | ICD-10-CM | POA: Diagnosis not present

## 2012-12-17 ENCOUNTER — Encounter: Payer: Self-pay | Admitting: Gynecology

## 2012-12-17 ENCOUNTER — Ambulatory Visit (INDEPENDENT_AMBULATORY_CARE_PROVIDER_SITE_OTHER): Payer: Medicare Other | Admitting: Gynecology

## 2012-12-17 VITALS — BP 120/70 | Ht 67.0 in | Wt 211.0 lb

## 2012-12-17 DIAGNOSIS — N920 Excessive and frequent menstruation with regular cycle: Secondary | ICD-10-CM | POA: Diagnosis not present

## 2012-12-17 DIAGNOSIS — N946 Dysmenorrhea, unspecified: Secondary | ICD-10-CM

## 2012-12-17 LAB — CBC WITH DIFFERENTIAL/PLATELET
Eosinophils Absolute: 0.1 10*3/uL (ref 0.0–0.7)
Hemoglobin: 13.4 g/dL (ref 12.0–15.0)
Lymphocytes Relative: 33 % (ref 12–46)
Lymphs Abs: 2.4 10*3/uL (ref 0.7–4.0)
MCH: 29.5 pg (ref 26.0–34.0)
Monocytes Relative: 11 % (ref 3–12)
Neutrophils Relative %: 54 % (ref 43–77)
RBC: 4.55 MIL/uL (ref 3.87–5.11)

## 2012-12-17 NOTE — Patient Instructions (Signed)
Follow up for ultrasound as scheduled 

## 2012-12-17 NOTE — Progress Notes (Signed)
April Meyer 30-Oct-1965 [id number]        47 y.o.  G2P1011 for followup exam.  Several issues noted below.  Past medical history,surgical history, medications, allergies, family history and social history were all reviewed and documented in the EPIC chart.  ROS:  Performed and pertinent positives and negatives are included in the history, assessment and plan .  Exam: [name] Filed Vitals:   12/17/12 0906  BP: 120/70  Height: 5\' 7"  (1.702 m)  Weight: 211 lb (95.709 kg)   General appearance  Normal Skin grossly normal Head/Neck normal with no cervical or supraclavicular adenopathy thyroid normal Lungs  clear Cardiac RR, without RMG Abdominal  soft, nontender, without masses, organomegaly or hernia Breasts  examined lying and sitting without masses, retractions, discharge or axillary adenopathy. Pelvic  Ext/BUS/vagina  normal  Cervix  normal  Uterus  retroverted, normal size, shape and contour, midline and mobile nontender   Adnexa  Without masses or tenderness    Anus and perineum  normal   Rectovaginal  normal sphincter tone without palpated masses or tenderness.    Assessment/Plan:  47 y.o. G70P1011 female for annual exam.   1. Menorrhagia/dysmenorrhea. Patient's menses have progressively gotten heavier and more painful. Requires double protection with bleedthrough episodes. Lasting 7 days. Exam is normal. Check baseline CBC. Reports recent thyroid check at primary physician's office normal. Schedule sonohysterogram rule out nonpalpable abnormalities such as submucous myomas or endometrial polyps. Options for management were reviewed in general to include hormonal manipulation, endometrial ablation , hysterectomy. She had questions about the ablation as her sister apparently has had one and done well. Issues of contraception discussed and the absolute need to use if she becomes sexually active reviewed. No desire for future pregnancies. Patient will follow up for studies and then  we'll go from there. 2. Contraception. Not sexually active nor plans to be anytime soon. I again reviewed options to include hormonal IUD and sterilization. She did not tolerate IUDs in the past nor did well with oral contraceptives. She does have a smoking history although has quit. At this point patient declines any contraception but does need to use a barrier if she does become sexual active. 3. Pap/HPV normal 2013. No Pap smear done today. No history of abnormal Pap smears previously. Plan repeat Pap smear in 3-5 year interval. 4. Mammography 07/2012. Continue with annual mammography. SBE monthly review. 5. Health maintenance. No routine blood work done and she reports having this all done at her primary physician's office. Followup for ultrasound and blood count results.   Note: This document was prepared with digital dictation and possible smart phrase technology. Any transcriptional errors that result from this process are unintentional.   [name], 9:25 AM 12/17/2012

## 2012-12-18 LAB — URINALYSIS W MICROSCOPIC + REFLEX CULTURE
Bacteria, UA: NONE SEEN
Casts: NONE SEEN
Glucose, UA: NEGATIVE mg/dL
Hgb urine dipstick: NEGATIVE
Leukocytes, UA: NEGATIVE
Specific Gravity, Urine: 1.017 (ref 1.005–1.030)
Squamous Epithelial / LPF: NONE SEEN
pH: 6.5 (ref 5.0–8.0)

## 2012-12-27 ENCOUNTER — Other Ambulatory Visit: Payer: Self-pay | Admitting: Gynecology

## 2012-12-27 DIAGNOSIS — N946 Dysmenorrhea, unspecified: Secondary | ICD-10-CM

## 2012-12-29 DIAGNOSIS — M2550 Pain in unspecified joint: Secondary | ICD-10-CM | POA: Diagnosis not present

## 2012-12-29 DIAGNOSIS — G894 Chronic pain syndrome: Secondary | ICD-10-CM | POA: Diagnosis not present

## 2012-12-29 DIAGNOSIS — Q796 Ehlers-Danlos syndrome, unspecified: Secondary | ICD-10-CM | POA: Diagnosis not present

## 2012-12-30 ENCOUNTER — Ambulatory Visit (INDEPENDENT_AMBULATORY_CARE_PROVIDER_SITE_OTHER): Payer: Medicare Other

## 2012-12-30 ENCOUNTER — Encounter: Payer: Self-pay | Admitting: Gynecology

## 2012-12-30 ENCOUNTER — Other Ambulatory Visit: Payer: Self-pay | Admitting: Gynecology

## 2012-12-30 ENCOUNTER — Ambulatory Visit (INDEPENDENT_AMBULATORY_CARE_PROVIDER_SITE_OTHER): Payer: Medicare Other | Admitting: Gynecology

## 2012-12-30 DIAGNOSIS — D3912 Neoplasm of uncertain behavior of left ovary: Secondary | ICD-10-CM

## 2012-12-30 DIAGNOSIS — N946 Dysmenorrhea, unspecified: Secondary | ICD-10-CM | POA: Diagnosis not present

## 2012-12-30 DIAGNOSIS — N920 Excessive and frequent menstruation with regular cycle: Secondary | ICD-10-CM

## 2012-12-30 DIAGNOSIS — N839 Noninflammatory disorder of ovary, fallopian tube and broad ligament, unspecified: Secondary | ICD-10-CM

## 2012-12-30 DIAGNOSIS — D251 Intramural leiomyoma of uterus: Secondary | ICD-10-CM

## 2012-12-30 DIAGNOSIS — N838 Other noninflammatory disorders of ovary, fallopian tube and broad ligament: Secondary | ICD-10-CM

## 2012-12-30 DIAGNOSIS — R9389 Abnormal findings on diagnostic imaging of other specified body structures: Secondary | ICD-10-CM

## 2012-12-30 DIAGNOSIS — N83209 Unspecified ovarian cyst, unspecified side: Secondary | ICD-10-CM

## 2012-12-30 DIAGNOSIS — D259 Leiomyoma of uterus, unspecified: Secondary | ICD-10-CM | POA: Diagnosis not present

## 2012-12-30 DIAGNOSIS — N852 Hypertrophy of uterus: Secondary | ICD-10-CM

## 2012-12-30 DIAGNOSIS — D3910 Neoplasm of uncertain behavior of unspecified ovary: Secondary | ICD-10-CM

## 2012-12-30 DIAGNOSIS — D261 Other benign neoplasm of corpus uteri: Secondary | ICD-10-CM | POA: Diagnosis not present

## 2012-12-30 MED ORDER — ACYCLOVIR 400 MG PO TABS: 400.0000 mg | ORAL_TABLET | Freq: Two times a day (BID) | ORAL | Status: DC

## 2012-12-30 NOTE — Progress Notes (Addendum)
Patient presents for sonohysterogram due to history of worsening menorrhagia dysmenorrhea.  Ultrasound shows uterus overall normal size. Endometrial echo 3.8 mm Small myomata noted at 16 mm. Right ovary normal. Left ovary with cystic solid mass 16 mm with calcified type changes. Questionable small dermoid. Cul-de-sac negative  Sonohysterogram performed, sterile technique, easy catheter introduction, good distention with no abnormalities. Endometrial sample taken. Patient tolerated well.  Assessment and plan:  1. Worsening menorrhagia dysmenorrhea. I again reviewed options with her to include hormonal manipulation Mirena IUD endometrial ablation hysterectomy. She's had an IUD which was subsequently removed due to intolerable side effects. She previously smoked in the issue about birth control pills and prior cigarette smoking possible increased risks of thrombosis reviewed. Patient interested in pursuing ablation. I reviewed with her although she is not sexually active now she does not have permanent sterilization and if she did become sexually active the need for absolute contraception discussed. Offered option to consider tubal sterilization at the same time of ablation but she declined. She states that she does not anticipate sexual activity any time soon but if she did she would at least use condoms consistently. She understands it is possible to achieve pregnancy after ablation this would be very dangerous. She also understands that she should not consider wanting to become pregnant following the ablation as it would be probably very difficult but again very dangerous. Patient wants to move toward scheduling and we will go ahead and move in this direction. 2. Echogenic left ovarian cystic change. Suspicious for small dermoid. Reviewed with patient. No prior studies historically to compare. Recommend repeat ultrasound in 3 months. If enlarges discussed possible need for surgery. Patient's comfortable with  the plan and knows importance of followup in 3 months. 3. Patient also noted occasional HSV genital outbreaks and she no longer has Valtrex or acyclovir. Acyclovir 400 mg #30, 1  refill 1 by mouth twice a day x5 days with outbreak.

## 2012-12-30 NOTE — Patient Instructions (Signed)
Office will contact you to arrange endometrial ablation.  Use acyclovir 400 mg twice daily for 5 days at the earliest onset of herpes outbreak.

## 2013-01-04 DIAGNOSIS — D2330 Other benign neoplasm of skin of unspecified part of face: Secondary | ICD-10-CM | POA: Diagnosis not present

## 2013-01-04 DIAGNOSIS — L708 Other acne: Secondary | ICD-10-CM | POA: Diagnosis not present

## 2013-01-05 ENCOUNTER — Telehealth: Payer: Self-pay

## 2013-01-05 NOTE — Telephone Encounter (Signed)
I called patient regarding scheduling Her OPtion ablation. Patient says her menses are irregular and no way to predict when next period will be. She will call me Day One of next cycle. I explained we will start her on Prometrium on Day 3 so if menses starts on weekend just call me on Monday.

## 2013-01-13 ENCOUNTER — Other Ambulatory Visit: Payer: Self-pay

## 2013-01-18 DIAGNOSIS — Q796 Ehlers-Danlos syndrome, unspecified: Secondary | ICD-10-CM | POA: Diagnosis not present

## 2013-01-20 ENCOUNTER — Telehealth: Payer: Self-pay

## 2013-01-20 ENCOUNTER — Other Ambulatory Visit: Payer: Self-pay | Admitting: Gynecology

## 2013-01-20 DIAGNOSIS — N920 Excessive and frequent menstruation with regular cycle: Secondary | ICD-10-CM

## 2013-01-20 MED ORDER — PROGESTERONE MICRONIZED 200 MG PO CAPS: 200.0000 mg | ORAL_CAPSULE | Freq: Every day | ORAL | Status: DC

## 2013-01-20 MED ORDER — MISOPROSTOL 200 MCG PO TABS: ORAL_TABLET | ORAL | Status: DC

## 2013-01-20 NOTE — Telephone Encounter (Signed)
Patient had called me yesterday and left me a message that her period had started (11.12.14) but she was feeling nervous about the ablation and may not want to do it.  I called her and talked with her. She was under the impression she would have no medication at the time. I explained that it indeed was not general anesthesia and she would not be "asleep" for the procedure but Dr. Velvet Bathe would prescribe medications that would relax her and help with pain.  Patient asked about doing it at the hospital under general anesthesia.  I offered to check with Dr. Velvet Bathe about this for her but did remind her that the general anesthesia would be a greater risk that the surgery.  I explained that it would be more expensive as facility fee and anesthesia bill greater than office charges.  She decided she wanted to proceed.  Since it was 4:50am when we talked I told her to sleep on it and consider and I would call her this morning to discuss further.  I called this morning and she is ready to proceed.  She knows several people who have had endo ablation and have talked with them.  I scheduled her for Dec 3 8:30am.  She was instructed regarded full bladder, someone to drive her to and from and Cytotec vag hs before procedure.  She was also instructed to start Prometrium tomorrow hs (Day 3) and take daily until procedure.  Her preop cons was scheduled for Dec 1 8:00am and her post op was scheduled as well.  The full bladder instructions and all her dates were mailed to her.

## 2013-01-27 DIAGNOSIS — Z79899 Other long term (current) drug therapy: Secondary | ICD-10-CM | POA: Diagnosis not present

## 2013-01-27 DIAGNOSIS — Q796 Ehlers-Danlos syndrome, unspecified: Secondary | ICD-10-CM | POA: Diagnosis not present

## 2013-01-27 DIAGNOSIS — G894 Chronic pain syndrome: Secondary | ICD-10-CM | POA: Diagnosis not present

## 2013-02-07 ENCOUNTER — Ambulatory Visit (INDEPENDENT_AMBULATORY_CARE_PROVIDER_SITE_OTHER): Payer: Medicare Other | Admitting: Gynecology

## 2013-02-07 ENCOUNTER — Encounter: Payer: Self-pay | Admitting: Gynecology

## 2013-02-07 DIAGNOSIS — J329 Chronic sinusitis, unspecified: Secondary | ICD-10-CM | POA: Diagnosis not present

## 2013-02-07 DIAGNOSIS — N920 Excessive and frequent menstruation with regular cycle: Secondary | ICD-10-CM | POA: Diagnosis not present

## 2013-02-07 DIAGNOSIS — J029 Acute pharyngitis, unspecified: Secondary | ICD-10-CM | POA: Diagnosis not present

## 2013-02-07 MED ORDER — DIAZEPAM 5 MG PO TABS: 5.0000 mg | ORAL_TABLET | Freq: Four times a day (QID) | ORAL | Status: DC | PRN

## 2013-02-07 MED ORDER — HYDROCODONE-ACETAMINOPHEN 5-325 MG PO TABS: 1.0000 | ORAL_TABLET | Freq: Four times a day (QID) | ORAL | Status: DC | PRN

## 2013-02-07 MED ORDER — AZITHROMYCIN 500 MG PO TABS: 500.0000 mg | ORAL_TABLET | Freq: Every day | ORAL | Status: DC

## 2013-02-07 NOTE — Patient Instructions (Signed)
Followup for procedure as scheduled.  Endometrial Ablation Endometrial ablation removes the lining of the uterus (endometrium). It is usually a same-day, outpatient treatment. Ablation helps avoid major surgery, such as surgery to remove the cervix and uterus (hysterectomy). After endometrial ablation, you will have little or no menstrual bleeding and may not be able to have children. However, if you are premenopausal, you will need to use a reliable method of birth control following the procedure because of the small chance that pregnancy can occur. There are different reasons to have this procedure, which include:  Heavy periods.  Bleeding that is causing anemia.  Irregular bleeding.  Bleeding fibroids on the lining inside the uterus if they are smaller than 3 centimeters. This procedure should not be done if:  You want children in the future.  You have severe cramps with your menstrual period.  You have precancerous or cancerous cells in your uterus.  You were recently pregnant.  You have gone through menopause.  You have had major surgery on the uterus, such as a cesarean delivery. LET [facility] CARE PROVIDER KNOW ABOUT:  Any allergies you have.  All medicines you are taking, including vitamins, herbs, eye drops, creams, and over-the-counter medicines.  Previous problems you or members of your family have had with the use of anesthetics.  Any blood disorders you have.  Previous surgeries you have had.  Medical conditions you have. RISKS AND COMPLICATIONS  Generally, this is a safe procedure. However, as with any procedure, complications can occur. Possible complications include:  Perforation of the uterus.  Bleeding.  Infection of the uterus, bladder, or vagina.  Injury to surrounding organs.  An air bubble to the lung (air embolus).  Pregnancy following the procedure.  Failure of the procedure to help the problem, requiring hysterectomy.  Decreased  ability to diagnose cancer in the lining of the uterus. BEFORE THE PROCEDURE  The lining of the uterus must be tested to make sure there is no pre-cancerous or cancer cells present.  An ultrasound may be performed to look at the size of the uterus and to check for abnormalities.  Medicines may be given to thin the lining of the uterus. PROCEDURE  During the procedure, your health care provider will use a tool called a resectoscope to help see inside your uterus. There are different ways to remove the lining of your uterus.   Radiofrequency  This method uses a radiofrequency-alternating electric current to remove the lining of the uterus.  Cryotherapy This method uses extreme cold to freeze the lining of the uterus.  Heated-Free Liquid  This method uses heated salt (saline) solution to remove the lining of the uterus.  Microwave This method uses high-energy microwaves to heat up the lining of the uterus to remove it.  Thermal balloon  This method involves inserting a catheter with a balloon tip into the uterus. The balloon tip is filled with heated fluid to remove the lining of the uterus. AFTER THE PROCEDURE  After your procedure, do not have sexual intercourse or insert anything into your vagina until permitted by your health care provider. After the procedure, you may experience:  Cramps.  Vaginal discharge.  Frequent urination. Document Released: 01/04/2004 Document Revised: 10/27/2012 Document Reviewed: 07/28/2012 [facility] Patient Information 2014 [place], [place].

## 2013-02-07 NOTE — Progress Notes (Signed)
Patient presents preoperatively for upcoming HerOption endometrial ablation. She has read through the consent form and signed it. I reviewed the proposed surgery with her to include the expected intraoperative and postoperative courses. I reviewed the medications and how to use them and provided her with an instruction sheet. I  again reinforced that she should never pursue pregnancy following the procedure and that it could be dangerous if she did. She is not sexually active but understands that if she chooses to become sexually active she needs to use assured birth control. I also reviewed with her there are no guarantees for success rate in that her menorrhagia may continue worsening or change following the procedure. The risks of infection, hemorrhage necessitating treatment, internal organ damage including uterine perforation damage to bowel bladder ureters vessels and nerves necessitating major exploratory reparative surgeries were all reviewed with her. Patient's questions were answered to her satisfaction.  Exam was Administrator, [employer] vagina normal. Cervix grossly normal. Uterus normal size retroverted midline mobile nontender. Adnexa without masses or tenderness.  Assessment and plan: Menorrhagia. Normal sonohysterogram with negative endometrial biopsy. Hemoglobin 13  thyroid reported normal from primary physician's office. Options for management reviewed to include hormonal manipulation, endometrial ablation and hysterectomy. Patient elects for endometrial ablation as noted above. Patient's questions were answered and she is ready to proceed with the procedure. Written prescriptions for Valium 5 mg #15, Lortab 5.0 #20 and azithromycin 500 mg #2 prescribed. Patient already has a cytotec 200 mcg prescription

## 2013-02-09 ENCOUNTER — Ambulatory Visit (INDEPENDENT_AMBULATORY_CARE_PROVIDER_SITE_OTHER): Payer: Medicare Other

## 2013-02-09 ENCOUNTER — Encounter: Payer: Self-pay | Admitting: Gynecology

## 2013-02-09 ENCOUNTER — Ambulatory Visit (INDEPENDENT_AMBULATORY_CARE_PROVIDER_SITE_OTHER): Payer: Medicare Other | Admitting: Gynecology

## 2013-02-09 VITALS — BP 120/70 | HR 60

## 2013-02-09 DIAGNOSIS — R102 Pelvic and perineal pain: Secondary | ICD-10-CM

## 2013-02-09 DIAGNOSIS — N920 Excessive and frequent menstruation with regular cycle: Secondary | ICD-10-CM

## 2013-02-09 DIAGNOSIS — N949 Unspecified condition associated with female genital organs and menstrual cycle: Secondary | ICD-10-CM | POA: Diagnosis not present

## 2013-02-09 HISTORY — PX: ENDOMETRIAL ABLATION: SHX621

## 2013-02-09 MED ORDER — KETOROLAC TROMETHAMINE 30 MG/ML IJ SOLN
60.0000 mg | Freq: Once | INTRAMUSCULAR | Status: AC
  Administered 2013-02-09: 60 mg via INTRAMUSCULAR

## 2013-02-09 MED ORDER — LIDOCAINE HCL 1 % IJ SOLN
10.0000 mL | Freq: Once | INTRAMUSCULAR | Status: AC
  Administered 2013-02-09: 10 mL

## 2013-02-09 NOTE — Progress Notes (Signed)
HER OPTION ENDOMETRIAL ABLATION PROCEDURAL NOTE    April Meyer 1965-03-20 [id number]   02/09/2013  Diagnosis:  Excessive uterine bleeding / Menorrhagia  Procedure:  Endometrial cryoablation with intraoperative ultrasonic guidance  Procedure:  The patient was brought to the treatment room having previously been counseled for the procedure and having signed the consent form that is scanned into Epic. Pre procedural medications received were Toradol 60 mg IM and Vicodin 5.0/325x1 tablet.  The patient was placed in the dorsal lithotomy position and a speculum was inserted. The cervix and upper vagina were cleansed with Betadine. A single-tooth tenaculum was placed on the anterior lip of the cervix. A  paracervical block was placed yes using 10 cc's of 1% lidocaine.  The uterus was yes sounded  8 centimeters. Cervical dilatation performed no . Under ultrasound guidance, the Her Option probe was introduced into the uterine cavity after the preprocedural sequence was performed. After assuring proper cornual placement, cryoablation was then performed under continuous ultrasound guidance monitoring the growth of the cryo-zone. Sequential cryoablation's were performed in the following order:   Location   Length of Time Myometrial Depth 1. right cornua   6 minutes  9.1 mm AP 8.5 mm transverse mm 2. left cornua   5 minutes  7.0 mm AP 6.8 mm transverse mm    Upon completion of the procedure the instruments were removed, hemostasis visualized the patient was assisted to the bathroom and then to another exam room where she was observed. The patient tolerated the procedure well and was released in stable condition with her driver along with a copy of the postprocedural instructions and precautions which were reviewed with her. She is to return to the office in 2 weeks for post procedural check 18 December at 9:20 AM.   Note: This document was prepared with digital dictation and possible smart phrase technology.  Any transcriptional errors that result from this process are unintentional.  [name], 10:59 AM 02/09/2013

## 2013-02-09 NOTE — Patient Instructions (Signed)

## 2013-02-22 DIAGNOSIS — J029 Acute pharyngitis, unspecified: Secondary | ICD-10-CM | POA: Diagnosis not present

## 2013-02-22 DIAGNOSIS — F329 Major depressive disorder, single episode, unspecified: Secondary | ICD-10-CM | POA: Diagnosis not present

## 2013-02-22 DIAGNOSIS — F3289 Other specified depressive episodes: Secondary | ICD-10-CM | POA: Diagnosis not present

## 2013-02-24 ENCOUNTER — Ambulatory Visit: Payer: Medicare Other | Admitting: Gynecology

## 2013-03-08 ENCOUNTER — Ambulatory Visit (INDEPENDENT_AMBULATORY_CARE_PROVIDER_SITE_OTHER): Payer: Medicare Other | Admitting: Gynecology

## 2013-03-08 ENCOUNTER — Encounter: Payer: Self-pay | Admitting: Gynecology

## 2013-03-08 DIAGNOSIS — N920 Excessive and frequent menstruation with regular cycle: Secondary | ICD-10-CM

## 2013-03-08 DIAGNOSIS — Z9889 Other specified postprocedural states: Secondary | ICD-10-CM

## 2013-03-08 NOTE — Patient Instructions (Signed)
Followup October 2015 for annual exam, sooner if any issues.

## 2013-03-08 NOTE — Progress Notes (Signed)
Patient presents for her postoperative visit status post HerOption endometrial ablation 02/09/2013. She has done well without complaints.  Exam was chem Asst. External BUS vagina normal with mucousy discharge. Cervix normal. Uterus normal size mobile nontender. Adnexa without masses or tenderness.  Assessment and plan: Normal postop check status post HerOption endometrial ablation. Keep menstrual calendar. Followup October 2015 for annual exam when due. Sooner if any issues.

## 2013-03-10 HISTORY — PX: OTHER SURGICAL HISTORY: SHX169

## 2013-03-15 DIAGNOSIS — Q054 Unspecified spina bifida with hydrocephalus: Secondary | ICD-10-CM | POA: Diagnosis not present

## 2013-03-15 DIAGNOSIS — E039 Hypothyroidism, unspecified: Secondary | ICD-10-CM | POA: Diagnosis not present

## 2013-03-15 DIAGNOSIS — F909 Attention-deficit hyperactivity disorder, unspecified type: Secondary | ICD-10-CM | POA: Diagnosis not present

## 2013-03-15 DIAGNOSIS — F411 Generalized anxiety disorder: Secondary | ICD-10-CM | POA: Diagnosis not present

## 2013-03-21 DIAGNOSIS — I676 Nonpyogenic thrombosis of intracranial venous system: Secondary | ICD-10-CM | POA: Diagnosis not present

## 2013-03-21 DIAGNOSIS — G4489 Other headache syndrome: Secondary | ICD-10-CM | POA: Diagnosis not present

## 2013-03-21 DIAGNOSIS — M542 Cervicalgia: Secondary | ICD-10-CM | POA: Diagnosis not present

## 2013-03-21 DIAGNOSIS — G930 Cerebral cysts: Secondary | ICD-10-CM | POA: Diagnosis not present

## 2013-03-23 ENCOUNTER — Other Ambulatory Visit: Payer: Self-pay | Admitting: Neurology

## 2013-03-23 DIAGNOSIS — M542 Cervicalgia: Secondary | ICD-10-CM

## 2013-03-23 DIAGNOSIS — R200 Anesthesia of skin: Secondary | ICD-10-CM

## 2013-03-25 ENCOUNTER — Encounter: Payer: Self-pay | Admitting: Neurology

## 2013-03-25 DIAGNOSIS — M2550 Pain in unspecified joint: Secondary | ICD-10-CM | POA: Diagnosis not present

## 2013-03-25 DIAGNOSIS — M542 Cervicalgia: Secondary | ICD-10-CM | POA: Diagnosis not present

## 2013-03-25 DIAGNOSIS — G894 Chronic pain syndrome: Secondary | ICD-10-CM | POA: Diagnosis not present

## 2013-03-25 DIAGNOSIS — M531 Cervicobrachial syndrome: Secondary | ICD-10-CM | POA: Diagnosis not present

## 2013-03-25 DIAGNOSIS — IMO0001 Reserved for inherently not codable concepts without codable children: Secondary | ICD-10-CM | POA: Diagnosis not present

## 2013-03-25 DIAGNOSIS — Z79899 Other long term (current) drug therapy: Secondary | ICD-10-CM | POA: Diagnosis not present

## 2013-03-28 ENCOUNTER — Ambulatory Visit (INDEPENDENT_AMBULATORY_CARE_PROVIDER_SITE_OTHER): Payer: Medicare Other | Admitting: Neurology

## 2013-03-28 ENCOUNTER — Encounter: Payer: Self-pay | Admitting: Neurology

## 2013-03-28 VITALS — BP 109/75 | HR 64 | Resp 17 | Ht 66.0 in | Wt 216.0 lb

## 2013-03-28 DIAGNOSIS — R5381 Other malaise: Secondary | ICD-10-CM | POA: Diagnosis not present

## 2013-03-28 DIAGNOSIS — R5383 Other fatigue: Secondary | ICD-10-CM

## 2013-03-28 DIAGNOSIS — G4710 Hypersomnia, unspecified: Secondary | ICD-10-CM | POA: Diagnosis not present

## 2013-03-28 DIAGNOSIS — Z86718 Personal history of other venous thrombosis and embolism: Secondary | ICD-10-CM

## 2013-03-28 DIAGNOSIS — Z8669 Personal history of other diseases of the nervous system and sense organs: Secondary | ICD-10-CM | POA: Diagnosis not present

## 2013-03-28 DIAGNOSIS — H814 Vertigo of central origin: Secondary | ICD-10-CM

## 2013-03-28 HISTORY — DX: Other malaise: R53.81

## 2013-03-28 HISTORY — DX: Personal history of other venous thrombosis and embolism: Z86.718

## 2013-03-28 HISTORY — DX: Vertigo of central origin: H81.4

## 2013-03-28 HISTORY — DX: Other fatigue: R53.83

## 2013-03-28 NOTE — Progress Notes (Addendum)
[facility]  Provider:  Melvyn Novas, M D  Referring Provider: Pearson Grippe, MD Primary Care Physician:  Pearson Grippe, MD  Chief Complaint  Patient presents with  . New Evaluation    Room 10  . sleep consult    L- 20/20 R-20/20 with glasses    HPI:  April Meyer is a 48 y.o. female  Is seen here as a referral from Dr. Selena Batten for Excessive Fatigue.   The patient reports that her main problem list and he'll excessive fatigue and tired. She lacks energy, things she used to enjoy now are very hard for her to do. She also has noticed a tendency to bruise easily she is not snoring she has ringing in her ear is some trouble swallowing she feels constipated and has cramps aching muscles joint pain she does have some birthmarks and the rash did she endorse on the left neck. She also endorsed urinary incontinence dizziness numbness headaches memory loss insomnia sleepiness, much sleep with a feeling restored and the need for daytime naps. The review of her medication list revealed that some of the medication certainly would foster hypersomnia , but uses only PRN , thus  are less likely to contribute to her current degree of fatigue.  She does take daily oxycodone but only for  PRN  use are Valium, Flexeril and Xanax.  Mrs. Cortes reports that prior to her Arnold-Chiari repair surgery she had problems with lower extremity weakness with swallowing and has vertigo and dizziness,  but she had no pain or headaches.  The symptoms  of excessive fatigue and sleepiness seems to be related to the outcome of the surgery.  Her  Chiari surgery was performed in [place] and she was released to a hotel the day of the surgery, were she developed an asthma attack and had to return to the hospital. Next a she was released again to a motel watch for 3 days but it is a pain medication and postsurgical state she can hardly remember her stay there.  From there she moved to another hotel returning home. Her left  rib , 5th- was used instead of a cadaver bone for the chiari repair. Dr Patrici Ranks.   Dr. Selena Batten would like her to undergo a sleep study.  Her sleep habits are as follows she usually goes to bed around 10 PM ,sometimes even earlier , and she rises at about 6 AM.  Typically wake up spontaneously before her alarm goes off. When going to bed she will watch TV in bed she will fall asleep very promptly. She will go to the bathroom 3-4 times, she will awaken with a dry mouth and with headaches. Her headaches are at the nape of the neck and occiput,  and radiate  temporal , but not frontal. HA with photophobia ,  and visual aura ( classic Migraine )  She feels still a restriction to her neck movements especially torotation and to tilt. 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  She has only one or no caffeine in AM, takes topiramate, which changed her taste. Drinks no ETOH, not a smoker. Quit 6 years ago.      Review of Systems: Out of a complete 14 system review, the patient complains of only the following symptoms, and all other reviewed systems are negative.She lacks energy, things she used to enjoy now are very hard for her to do.  She also has noticed a tendency to bruise easily she is not snoring she has ringing in her ear is some trouble swallowing she feels constipated and has cramps aching muscles joint pain she does have some birthmarks and the rash did she endorse on the left neck.  She also endorsed urinary incontinence dizziness numbness headaches memory loss insomnia sleepiness,  Too much sleep with a feeling of being non -restored and the need for daytime naps. She endorses fatigue severity score at 63 points and the Epworth Sleepiness Scale at 14 points.    History   Social History  . Marital Status: Divorced    Spouse Name: N/A    Number of Children: 1  . Years of Education: 16   Occupational History  .  Other    Social History Main Topics  . Smoking status: Former Smoker    Quit date: 09/09/2007  . Smokeless tobacco: Never Used  . Alcohol Use: No  . Drug Use: No  . Sexual Activity: Not Currently    Partners: Male   Other Topics Concern  . Not on file   Social History Narrative   Patient is divorced and lives at home with her one child.   Patient is disabled.   Patient is right-handed.   Patient has a college education.   Patient drinks one cup of coffee daily.    Family History  Problem Relation Age of Onset  . Diabetes Maternal Aunt   . Breast cancer Maternal Aunt     40's  . Diabetes Maternal Uncle   . Breast cancer Paternal Aunt 40  . Diabetes Maternal Grandmother   . Cancer Maternal Grandmother     SKIN AND LUNG  . Diabetes Paternal Grandmother   . Breast cancer Maternal Aunt     50's  . Breast cancer Maternal Aunt     50's    Past Medical History  Diagnosis Date  . Arnold-Chiari deformity   . Brain tumor   . HSV infection   . Hypothyroidism   . EDS (Ehlers-Danlos syndrome)   . ADHD (attention deficit hyperactivity disorder)   . Migraine   . Anxiety   . Chronic pain   . Vitamin D deficiency   . Obstructive sleep apnea   . GERD (gastroesophageal reflux disease)   . Hemorrhoids   . Eustachian tube dysfunction   . Other malaise and fatigue 03/28/2013    " The patient reports feeling happy, ED, sore" on she has undergone at Chiari malformation surgery decompression in 2011. Prior to the surgery were chief complaints were weakness numbness and neck problems. She had swallowing difficulties and dizziness;  Classic Chiari presentation. But she didn't have headaches.    Past Surgical History  Procedure Laterality Date  . Arnold chiari repair    . Dilation and curettage of uterus    . Cholecystectomy    . Tonsillectomy and adenoidectomy    . Intrauterine device insertion  10/2010    Mirena  . Iud removal  11/2010    could not tolerate hormonal side effects  .  Neck fusion c1-c3    . Brain surgery    . Endometrial ablation  02/09/2013    [facility]  . Tubal ligation      Current Outpatient Prescriptions  Medication Sig Dispense Refill  . acyclovir (ZOVIRAX) 400 MG tablet Take 1 tablet (400 mg total) by mouth 2 (two) times daily. For 5 days  30 tablet  2  . Ascorbic Acid (VITAMIN C)  1000 MG tablet Take 1,000 mg by mouth daily.      Marland Kitchen b complex vitamins tablet Take 1 tablet by mouth daily.      . Biotin 5 MG TABS Take 1 tablet by mouth daily.      Marland Kitchen buPROPion (WELLBUTRIN XL) 150 MG 24 hr tablet Take 150 mg by mouth daily.      . diazepam (VALIUM) 5 MG tablet Take 1 tablet (5 mg total) by mouth every 6 (six) hours as needed for anxiety.  15 tablet  0  . gabapentin (NEURONTIN) 300 MG capsule Take 300 mg by mouth 4 (four) times daily.      . Levothyroxine Sodium (SYNTHROID PO) Take 125 mcg by mouth daily.       . Omeprazole Magnesium (PRILOSEC OTC PO) Take by mouth.        . Oxycodone HCl 10 MG TABS Take by mouth.      . ALPRAZolam (XANAX) 0.5 MG tablet Take 0.5 mg by mouth 3 (three) times daily as needed. Anxiety      . cyclobenzaprine (FLEXERIL) 10 MG tablet Take 10 mg by mouth daily.      . diazepam (VALIUM) 5 MG tablet Take 1 tablet (5 mg total) by mouth 2 (two) times daily.  10 tablet  0  . promethazine (PHENERGAN) 25 MG tablet Take 25 mg by mouth every 6 (six) hours as needed. Nausea       No current facility-administered medications for this visit.    Allergies as of 03/28/2013 - Review Complete 03/28/2013  Allergen Reaction Noted  . Other Anaphylaxis 03/25/2013  . Prednisone Anaphylaxis   . Strattera [atomoxetine hcl]  03/25/2013    Vitals: BP 109/75  Pulse 64  Resp 17  Ht 5\' 6"  (1.676 m)  Wt 216 lb (97.977 kg)  BMI 34.88 kg/m2  LMP 03/02/2013 Last Weight:  Wt Readings from Last 1 Encounters:  03/28/13 216 lb (97.977 kg)   Last Height:   Ht Readings from Last 1 Encounters:  03/28/13 5\' 6"  (1.676 m)    Physical  exam:  General: The patient is awake, alert and appears not in acute distress. The patient is well groomed. Head: Normocephalic, atraumatic. Neck is supple. Mallampati 2 , neck circumference:15 . Cardiovascular:  Regular rate and rhythm, without  murmurs or carotid bruit, and without distended neck veins. Respiratory: Lungs are clear to auscultation. Skin:  Without evidence of edema, or rash Trunk: BMI is elevated , has a slumped forward posture.  Neurologic exam : The patient is awake and alert, oriented to place and time.  Memory subjective described as intact. There is a normal attention span & concentration ability.  Speech is fluent without   dysarthria, dysphonia or aphasia. Mood and affect are appropriate.  Cranial nerves: Pupils are equal and briskly reactive to light. Funduscopic exam without evidence of pallor or edema. Extraocular movements  in vertical and horizontal planes,  Right eye did no accommodate but adducts. There is a rotatory nystagmus on the left . Visual fields by finger perimetry are intact. Hearing to finger rub intact.  Facial sensation intact to fine touch. Facial motor strength is symmetric and tongue and uvula move midline.  Motor exam:   Normal tone and muscle bulk and symmetric, she has a mild foot dorsiflexion weakness on the left,  Normal eversion and inversion. Other wise similar  strength in all extremities.  Sensory:  Fine touch, pinprick and vibration were tested in all extremities. Proprioception  was  normal.  Coordination: Rapid alternating movements in the fingers/hands is tested and normal.  Finger-to-nose maneuver tested and normal without evidence of ataxia, dysmetria or tremor.  Gait and station: Patient walks without assistive device and is able and assisted stool climb up to the exam table.  Strength within normal limits. Stance is stable and normal. Tandem gait is fragmented. Romberg testing is positive , forward and backward. .  Deep  tendon reflexes: in the upper and lower extremities are symmetric and intact. Babinski maneuver response is  downgoing.   Assessment:  After physical and neurologic examination, review of laboratory studies, imaging, neurophysiology testing and pre-existing records, assessment is  1) Status post Arnold Chiari I, with repair. Post repair: having migraines and headaches related to tension. Ataxia, nystagmus.  2) she is hypersomnic,  she describes increasing hypersomnia related to post Chiari repair, and certainly medication the surgery itself and the residuals have an effect on her overall well-being her pain left level on a daily basis weight gain due to lack of exercise, but it could very well be a central dysregulation responsible for the hypersomnia as well. The patient has  Perhaps a pineal cyst ,supposedly . The lesion was once called a low-grade GLIOMA, at the tectum , a location that is controlling the eye movements. 3) snoring, loudly . EDS score of 14  With daily naps.   Plan:  Treatment plan and additional workup :  Full EEG PSG, SPLIT at AHI 15 and score at 4% . She is obese, she has abnormal pharyngeal and laryngeal tone, has  Restricted neck movements,  RISK for OSA is much higher than 5  Years ago.  She has a CPAP from 2010 that may still be working, was barely used. Sleep study was done on [place], [facility]. She never had seen a physician.

## 2013-03-28 NOTE — Patient Instructions (Addendum)
Please forward the results from [facility] imaging to me. MRI brain.  Tectum , tegmentum. Pons to be reviewed,  Polysomnography (Sleep Studies) Polysomnography (PSG) is a series of tests used for detecting (diagnosing) obstructive sleep apnea and other sleep disorders. The tests measure how some parts of your body are working while you are sleeping. The tests are extensive and expensive. They are done in a sleep lab or hospital, and vary from center to center. Your caregiver may perform other more simple sleep studies and questionnaires before doing more complete and involved testing. Testing may not be covered by insurance. Some of these tests are:  An EEG (Electroencephalogram). This tests your brain waves and stages of sleep.  An EOG (Electrooculogram). This measures the movements of your eyes. It detects periods of REM (rapid eye movement) sleep, which is your dream sleep.  An EKG (Electrocardiogram). This measures your heart rhythm.  EMG (Electromyography). This is a measurement of how the muscles are working in your upper airway and your legs while sleeping.  An oximetry measurement. It measures how much oxygen (air) you are getting while sleeping.  Breathing efforts may be measured. The same test can be interpreted (understood) differently by different caregivers and centers that study sleep.  Studies may be given an apnea/hypopnea index (AHI). This is a number which is found by counting the times of no breathing or under breathing during the night, and relating those numbers to the amount of time spent in bed. When the AHI is greater than 15, the patient is likely to complain of daytime sleepiness. When the AHI is greater than 30, the patient is at increased risk for heart problems and must be followed more closely. Following the AHI also allows you to know how treatment is working. Simple oximetry (tracking the amount of oxygen that is taken in) can be used for screening patients  who:  Do not have symptoms (problems) of OSA.  Have a normal Epworth Sleepiness Scale Score.  Have a low pre-test probability of having OSA.  Have none of the upper airway problems likely to cause apnea.  Oximetry is also used to determine if treatment is effective in patients who showed significant desaturations (not getting enough oxygen) on their home sleep study. One extra measure of safety is to perform additional studies for the person who only snores. This is because no one can predict with absolute certainty who will have OSA. Those who show significant desaturations (not getting enough oxygen) are recommended to have a more detailed sleep study. Document Released: 08/31/2002 Document Revised: 05/19/2011 Document Reviewed: 02/24/2005 [facility] Patient Information 2014 [place]. Chiari Malformation Chiari Malformation(CM) causes brain tissue to settle into the spinal canal. There are four types of CM.  Type 1 is most common. It can go unnoticed until problems start, usually with headaches in young adults.  Type 2 is present at birth and always involves a form of spina bifida. Part of the spinal cord pushes through the spine and is exposed. Spina bifida usually causes paralysis of the legs.  Type 3 is more severe because it involves more brain tissue.  Type 4 is the most severe because the brain does not develop correctly. Adults and adolescents who are unaware they have Type 1 CM may have headaches that are located in the back of the head and get worse with coughing or straining. If more brain tissue is involved, problems may include dizziness, trouble with balance, and vision issues. Diagnosis is made by an MRI.  TREATMENT  Babies may need surgery to repair spina bifida. Medications may be used to control pain. Some adults with CM may benefit from surgery for which the goal is to keep the malformation from getting worse. Document Released: 02/14/2002 Document Revised:  05/19/2011 Document Reviewed: 02/22/2008 [facility] Patient Information 2014 [place].

## 2013-03-29 DIAGNOSIS — M542 Cervicalgia: Secondary | ICD-10-CM | POA: Diagnosis not present

## 2013-03-29 DIAGNOSIS — IMO0001 Reserved for inherently not codable concepts without codable children: Secondary | ICD-10-CM | POA: Diagnosis not present

## 2013-03-31 DIAGNOSIS — IMO0001 Reserved for inherently not codable concepts without codable children: Secondary | ICD-10-CM | POA: Diagnosis not present

## 2013-03-31 DIAGNOSIS — M542 Cervicalgia: Secondary | ICD-10-CM | POA: Diagnosis not present

## 2013-04-04 ENCOUNTER — Ambulatory Visit
Admission: RE | Admit: 2013-04-04 | Discharge: 2013-04-04 | Disposition: A | Discharge status: Home / Self Care | Payer: Medicare Other | Source: Ambulatory Visit | Attending: Neurology | Admitting: Neurology

## 2013-04-04 ENCOUNTER — Ambulatory Visit
Admission: RE | Admit: 2013-04-04 | Discharge: 2013-04-04 | Disposition: A | Discharge status: Home / Self Care | Payer: 59 | Source: Ambulatory Visit | Attending: Neurology | Admitting: Neurology

## 2013-04-04 DIAGNOSIS — R209 Unspecified disturbances of skin sensation: Secondary | ICD-10-CM | POA: Diagnosis not present

## 2013-04-04 DIAGNOSIS — M5020 Other cervical disc displacement, unspecified cervical region: Secondary | ICD-10-CM | POA: Diagnosis not present

## 2013-04-04 DIAGNOSIS — M542 Cervicalgia: Secondary | ICD-10-CM

## 2013-04-04 DIAGNOSIS — R200 Anesthesia of skin: Secondary | ICD-10-CM

## 2013-04-04 MED ORDER — GADOBENATE DIMEGLUMINE 529 MG/ML IV SOLN
19.0000 mL | Freq: Once | INTRAVENOUS | Status: AC | PRN
  Administered 2013-04-04: 19 mL via INTRAVENOUS

## 2013-04-06 DIAGNOSIS — IMO0001 Reserved for inherently not codable concepts without codable children: Secondary | ICD-10-CM | POA: Diagnosis not present

## 2013-04-06 DIAGNOSIS — M542 Cervicalgia: Secondary | ICD-10-CM | POA: Diagnosis not present

## 2013-04-08 DIAGNOSIS — IMO0001 Reserved for inherently not codable concepts without codable children: Secondary | ICD-10-CM | POA: Diagnosis not present

## 2013-04-08 DIAGNOSIS — M542 Cervicalgia: Secondary | ICD-10-CM | POA: Diagnosis not present

## 2013-04-13 DIAGNOSIS — M542 Cervicalgia: Secondary | ICD-10-CM | POA: Diagnosis not present

## 2013-04-13 DIAGNOSIS — IMO0001 Reserved for inherently not codable concepts without codable children: Secondary | ICD-10-CM | POA: Diagnosis not present

## 2013-04-15 DIAGNOSIS — IMO0001 Reserved for inherently not codable concepts without codable children: Secondary | ICD-10-CM | POA: Diagnosis not present

## 2013-04-15 DIAGNOSIS — M542 Cervicalgia: Secondary | ICD-10-CM | POA: Diagnosis not present

## 2013-04-18 DIAGNOSIS — M542 Cervicalgia: Secondary | ICD-10-CM | POA: Diagnosis not present

## 2013-04-18 DIAGNOSIS — IMO0001 Reserved for inherently not codable concepts without codable children: Secondary | ICD-10-CM | POA: Diagnosis not present

## 2013-04-20 DIAGNOSIS — Z1231 Encounter for screening mammogram for malignant neoplasm of breast: Secondary | ICD-10-CM | POA: Diagnosis not present

## 2013-04-21 ENCOUNTER — Encounter: Payer: Self-pay | Admitting: Gynecology

## 2013-04-21 DIAGNOSIS — M542 Cervicalgia: Secondary | ICD-10-CM | POA: Diagnosis not present

## 2013-04-21 DIAGNOSIS — IMO0001 Reserved for inherently not codable concepts without codable children: Secondary | ICD-10-CM | POA: Diagnosis not present

## 2013-04-29 ENCOUNTER — Ambulatory Visit (INDEPENDENT_AMBULATORY_CARE_PROVIDER_SITE_OTHER): Payer: Medicare Other

## 2013-04-29 DIAGNOSIS — G4710 Hypersomnia, unspecified: Secondary | ICD-10-CM

## 2013-04-29 DIAGNOSIS — Z8669 Personal history of other diseases of the nervous system and sense organs: Secondary | ICD-10-CM

## 2013-04-29 DIAGNOSIS — R5383 Other fatigue: Secondary | ICD-10-CM

## 2013-04-29 DIAGNOSIS — R5381 Other malaise: Secondary | ICD-10-CM

## 2013-05-02 DIAGNOSIS — Z79899 Other long term (current) drug therapy: Secondary | ICD-10-CM | POA: Diagnosis not present

## 2013-05-02 DIAGNOSIS — M961 Postlaminectomy syndrome, not elsewhere classified: Secondary | ICD-10-CM | POA: Diagnosis not present

## 2013-05-02 DIAGNOSIS — G894 Chronic pain syndrome: Secondary | ICD-10-CM | POA: Diagnosis not present

## 2013-05-02 DIAGNOSIS — M542 Cervicalgia: Secondary | ICD-10-CM | POA: Diagnosis not present

## 2013-05-02 DIAGNOSIS — IMO0001 Reserved for inherently not codable concepts without codable children: Secondary | ICD-10-CM | POA: Diagnosis not present

## 2013-05-02 DIAGNOSIS — Q796 Ehlers-Danlos syndrome, unspecified: Secondary | ICD-10-CM | POA: Diagnosis not present

## 2013-05-09 DIAGNOSIS — M542 Cervicalgia: Secondary | ICD-10-CM | POA: Diagnosis not present

## 2013-05-09 DIAGNOSIS — IMO0001 Reserved for inherently not codable concepts without codable children: Secondary | ICD-10-CM | POA: Diagnosis not present

## 2013-05-10 ENCOUNTER — Telehealth: Payer: Self-pay | Admitting: Neurology

## 2013-05-10 ENCOUNTER — Encounter: Payer: Self-pay | Admitting: *Deleted

## 2013-05-10 NOTE — Telephone Encounter (Signed)
I called and spoke with the patient about her recent sleep study results. I informed the patient that the study revealed no evidence of significant central or obstructive sleep apnea, but snoring was noted but wasn't associated with arousals primarily. I informed the patient that I fax a copy of the report to Dr. Jani Gravel and mail her copy of the report.

## 2013-05-13 DIAGNOSIS — M542 Cervicalgia: Secondary | ICD-10-CM | POA: Diagnosis not present

## 2013-05-13 DIAGNOSIS — IMO0001 Reserved for inherently not codable concepts without codable children: Secondary | ICD-10-CM | POA: Diagnosis not present

## 2013-05-18 DIAGNOSIS — IMO0001 Reserved for inherently not codable concepts without codable children: Secondary | ICD-10-CM | POA: Diagnosis not present

## 2013-05-18 DIAGNOSIS — M542 Cervicalgia: Secondary | ICD-10-CM | POA: Diagnosis not present

## 2013-05-20 DIAGNOSIS — M542 Cervicalgia: Secondary | ICD-10-CM | POA: Diagnosis not present

## 2013-05-20 DIAGNOSIS — IMO0001 Reserved for inherently not codable concepts without codable children: Secondary | ICD-10-CM | POA: Diagnosis not present

## 2013-05-23 DIAGNOSIS — IMO0001 Reserved for inherently not codable concepts without codable children: Secondary | ICD-10-CM | POA: Diagnosis not present

## 2013-05-23 DIAGNOSIS — M542 Cervicalgia: Secondary | ICD-10-CM | POA: Diagnosis not present

## 2013-05-26 DIAGNOSIS — M542 Cervicalgia: Secondary | ICD-10-CM | POA: Diagnosis not present

## 2013-05-26 DIAGNOSIS — IMO0001 Reserved for inherently not codable concepts without codable children: Secondary | ICD-10-CM | POA: Diagnosis not present

## 2013-05-30 DIAGNOSIS — M531 Cervicobrachial syndrome: Secondary | ICD-10-CM | POA: Diagnosis not present

## 2013-05-30 DIAGNOSIS — M961 Postlaminectomy syndrome, not elsewhere classified: Secondary | ICD-10-CM | POA: Diagnosis not present

## 2013-05-30 DIAGNOSIS — Q796 Ehlers-Danlos syndrome, unspecified: Secondary | ICD-10-CM | POA: Diagnosis not present

## 2013-05-30 DIAGNOSIS — Z79899 Other long term (current) drug therapy: Secondary | ICD-10-CM | POA: Diagnosis not present

## 2013-05-31 DIAGNOSIS — M542 Cervicalgia: Secondary | ICD-10-CM | POA: Diagnosis not present

## 2013-05-31 DIAGNOSIS — IMO0001 Reserved for inherently not codable concepts without codable children: Secondary | ICD-10-CM | POA: Diagnosis not present

## 2013-06-01 DIAGNOSIS — G4489 Other headache syndrome: Secondary | ICD-10-CM | POA: Diagnosis not present

## 2013-06-01 DIAGNOSIS — M542 Cervicalgia: Secondary | ICD-10-CM | POA: Diagnosis not present

## 2013-06-01 DIAGNOSIS — I676 Nonpyogenic thrombosis of intracranial venous system: Secondary | ICD-10-CM | POA: Diagnosis not present

## 2013-06-07 DIAGNOSIS — Z0000 Encounter for general adult medical examination without abnormal findings: Secondary | ICD-10-CM | POA: Diagnosis not present

## 2013-06-07 DIAGNOSIS — R7309 Other abnormal glucose: Secondary | ICD-10-CM | POA: Diagnosis not present

## 2013-06-07 DIAGNOSIS — E039 Hypothyroidism, unspecified: Secondary | ICD-10-CM | POA: Diagnosis not present

## 2013-06-08 DIAGNOSIS — R7309 Other abnormal glucose: Secondary | ICD-10-CM | POA: Diagnosis not present

## 2013-06-08 DIAGNOSIS — E039 Hypothyroidism, unspecified: Secondary | ICD-10-CM | POA: Diagnosis not present

## 2013-06-08 DIAGNOSIS — F411 Generalized anxiety disorder: Secondary | ICD-10-CM | POA: Diagnosis not present

## 2013-06-08 DIAGNOSIS — F909 Attention-deficit hyperactivity disorder, unspecified type: Secondary | ICD-10-CM | POA: Diagnosis not present

## 2013-06-23 DIAGNOSIS — J40 Bronchitis, not specified as acute or chronic: Secondary | ICD-10-CM | POA: Diagnosis not present

## 2013-07-01 DIAGNOSIS — Q796 Ehlers-Danlos syndrome, unspecified: Secondary | ICD-10-CM | POA: Diagnosis not present

## 2013-07-01 DIAGNOSIS — Z79899 Other long term (current) drug therapy: Secondary | ICD-10-CM | POA: Diagnosis not present

## 2013-07-01 DIAGNOSIS — G894 Chronic pain syndrome: Secondary | ICD-10-CM | POA: Diagnosis not present

## 2013-07-01 DIAGNOSIS — M2550 Pain in unspecified joint: Secondary | ICD-10-CM | POA: Diagnosis not present

## 2013-08-30 DIAGNOSIS — Q796 Ehlers-Danlos syndrome, unspecified: Secondary | ICD-10-CM | POA: Diagnosis not present

## 2013-08-30 DIAGNOSIS — M961 Postlaminectomy syndrome, not elsewhere classified: Secondary | ICD-10-CM | POA: Diagnosis not present

## 2013-08-30 DIAGNOSIS — M5412 Radiculopathy, cervical region: Secondary | ICD-10-CM | POA: Diagnosis not present

## 2013-09-01 DIAGNOSIS — Q054 Unspecified spina bifida with hydrocephalus: Secondary | ICD-10-CM | POA: Diagnosis not present

## 2013-09-01 DIAGNOSIS — G894 Chronic pain syndrome: Secondary | ICD-10-CM | POA: Diagnosis not present

## 2013-09-29 DIAGNOSIS — M2550 Pain in unspecified joint: Secondary | ICD-10-CM | POA: Diagnosis not present

## 2013-09-29 DIAGNOSIS — G894 Chronic pain syndrome: Secondary | ICD-10-CM | POA: Diagnosis not present

## 2013-09-29 DIAGNOSIS — Z79899 Other long term (current) drug therapy: Secondary | ICD-10-CM | POA: Diagnosis not present

## 2013-09-29 DIAGNOSIS — Q796 Ehlers-Danlos syndrome, unspecified: Secondary | ICD-10-CM | POA: Diagnosis not present

## 2013-09-30 DIAGNOSIS — I676 Nonpyogenic thrombosis of intracranial venous system: Secondary | ICD-10-CM | POA: Diagnosis not present

## 2013-09-30 DIAGNOSIS — M542 Cervicalgia: Secondary | ICD-10-CM | POA: Diagnosis not present

## 2013-09-30 DIAGNOSIS — G4489 Other headache syndrome: Secondary | ICD-10-CM | POA: Diagnosis not present

## 2013-10-18 ENCOUNTER — Emergency Department (HOSPITAL_COMMUNITY)
Admission: EM | Admit: 2013-10-18 | Discharge: 2013-10-18 | Disposition: A | Discharge status: Home / Self Care | Payer: Medicare Other | Attending: Emergency Medicine | Admitting: Emergency Medicine

## 2013-10-18 ENCOUNTER — Encounter (HOSPITAL_COMMUNITY): Payer: Self-pay | Admitting: Emergency Medicine

## 2013-10-18 DIAGNOSIS — Z23 Encounter for immunization: Secondary | ICD-10-CM | POA: Diagnosis not present

## 2013-10-18 DIAGNOSIS — Z209 Contact with and (suspected) exposure to unspecified communicable disease: Secondary | ICD-10-CM

## 2013-10-18 DIAGNOSIS — E039 Hypothyroidism, unspecified: Secondary | ICD-10-CM | POA: Insufficient documentation

## 2013-10-18 DIAGNOSIS — Z85841 Personal history of malignant neoplasm of brain: Secondary | ICD-10-CM | POA: Insufficient documentation

## 2013-10-18 DIAGNOSIS — Z87728 Personal history of other specified (corrected) congenital malformations of nervous system and sense organs: Secondary | ICD-10-CM | POA: Insufficient documentation

## 2013-10-18 DIAGNOSIS — G8929 Other chronic pain: Secondary | ICD-10-CM | POA: Insufficient documentation

## 2013-10-18 DIAGNOSIS — K219 Gastro-esophageal reflux disease without esophagitis: Secondary | ICD-10-CM | POA: Diagnosis not present

## 2013-10-18 DIAGNOSIS — F909 Attention-deficit hyperactivity disorder, unspecified type: Secondary | ICD-10-CM | POA: Insufficient documentation

## 2013-10-18 DIAGNOSIS — Z87891 Personal history of nicotine dependence: Secondary | ICD-10-CM | POA: Insufficient documentation

## 2013-10-18 DIAGNOSIS — Z8619 Personal history of other infectious and parasitic diseases: Secondary | ICD-10-CM | POA: Diagnosis not present

## 2013-10-18 DIAGNOSIS — G43909 Migraine, unspecified, not intractable, without status migrainosus: Secondary | ICD-10-CM | POA: Insufficient documentation

## 2013-10-18 DIAGNOSIS — Z8669 Personal history of other diseases of the nervous system and sense organs: Secondary | ICD-10-CM | POA: Diagnosis not present

## 2013-10-18 DIAGNOSIS — F411 Generalized anxiety disorder: Secondary | ICD-10-CM | POA: Diagnosis not present

## 2013-10-18 DIAGNOSIS — Z203 Contact with and (suspected) exposure to rabies: Secondary | ICD-10-CM

## 2013-10-18 DIAGNOSIS — Z79899 Other long term (current) drug therapy: Secondary | ICD-10-CM | POA: Insufficient documentation

## 2013-10-18 MED ORDER — RABIES VACCINE, PCEC IM SUSR
1.0000 mL | Freq: Once | INTRAMUSCULAR | Status: AC
  Administered 2013-10-18: 1 mL via INTRAMUSCULAR
  Filled 2013-10-18: qty 1

## 2013-10-18 MED ORDER — AMOXICILLIN-POT CLAVULANATE 875-125 MG PO TABS: 1.0000 | ORAL_TABLET | Freq: Two times a day (BID) | ORAL | Status: DC

## 2013-10-18 MED ORDER — RABIES IMMUNE GLOBULIN 150 UNIT/ML IM INJ
20.0000 [IU]/kg | INJECTION | Freq: Once | INTRAMUSCULAR | Status: AC
  Administered 2013-10-18: 2025 [IU] via INTRAMUSCULAR
  Filled 2013-10-18: qty 13.5

## 2013-10-18 NOTE — ED Notes (Signed)
Pt was swatting at a bat Saturday with a towel. Pt has red marking and bumps that came yesterday on right lower forearm.  Pt believes she could have been bitten by the bat.

## 2013-10-18 NOTE — Discharge Instructions (Signed)
1. Medications: usual home medications 2. Treatment: rest, drink plenty of fluids, keep wound clean and dry 3. Follow Up: Please followup as directed on the attached schedule    Rabies  Rabies is a viral infection that can be spread to people from infected animals. The infection affects the brain and central nervous system. Once the disease develops, it almost always causes death. Because of this, when a person is bitten by an animal that may have rabies, treatment to prevent rabies often needs to be started whether or not the animal is known to be infected. Prompt treatment with the rabies vaccine and rabies immune globulin is very effective at preventing the infection from developing in people who have been exposed to the rabies virus. CAUSES  Rabies is caused by a virus that lives inside some animals. When a person is bitten by an infected animal, the rabies virus is spread to the person through the infected spit (saliva) of the animal. This virus can be carried by animals such as dogs, cats, skunks, bats, woodchucks, raccoons, coyotes, and foxes. SYMPTOMS  By the time symptoms appear, rabies is usually fatal for the person. Common symptoms include:  Headache.  Fever.  Fatigue and weakness.  Agitation.  Anxiety.  Confusion.  Unusual behavior, such as hyperactivity, fear of water (hydrophobia), or fear of air (aerophobia).  Hallucinations.  Insomnia.  Weakness in the arms or legs.  Difficulty swallowing. Most people get sick in 1-3 months after being bitten. This often varies and may depend on the location of the bite. The infection will take less time to develop if the bite occurred closer to the head.  DIAGNOSIS  To determine if a person is infected, several tests must be performed, such as:  A skin biopsy.  A saliva test.  A lumbar puncture to remove spinal fluid so it can be examined.  Blood tests. TREATMENT  Treatment to prevent the infection from developing  (post-exposure prophylaxis, PEP) is often started before knowing for sure if the person has been exposed to the rabies virus. PEP involves cleaning the wound, giving an antibody injection (rabies immune globulin), and giving a series of rabies vaccine injections. The series of injections are usually given over a two-week period. If possible, the animal that bit the person will be observed to see if it remains healthy. If the animal has been killed, it can be sent to a state laboratory and examined to see if the animal had rabies. If a person is bitten by a domestic animal (dog, cat, or ferret) that appears healthy and can be observed to see if it remains healthy, often no further treatment is necessary other than care of the wounds caused by the animal. Rabies is often a fatal illness once the infection develops in a person. Although a few people who developed rabies have survived after experimental treatment with certain drugs, all these survivors still had severe nervous system problems after the treatment. This is why caregivers use extra caution and begin PEP treatment for people who have been bitten by animals that are possibly infected with rabies.  HOME CARE INSTRUCTIONS  If you were bitten by an unknown animal, make sure you know your caregiver's instructions for follow-up. If the animal was sent to a laboratory for examination, ask when the test results will be ready. Make sure you get the test results.  Take these steps to care for your wound:  Keep the wound clean, dry, and dressed as directed by your caregiver.  Keep the injured part elevated as much as possible.  Do not resume use of the affected area until directed.  Only take over-the-counter or prescription medicines as directed by your caregiver.  Keep all follow-up appointments as directed by your caregiver. PREVENTION  To prevent rabies, people need to reduce their risk of having contact with infected animals.   Make sure your  pets (dogs, cats, ferrets) are vaccinated against rabies. Keep these vaccinations up-to-date as directed by your veterinarian.  Supervise your pets when they are outside. Keep them away from wild animals.  Call your local animal control services to report any stray animals. These animals may not be vaccinated.  Stay away from stray or wild animals.  Consider getting the rabies vaccine (preexposure) if you are traveling to an area where rabies is common or if your job or activities involve possible contact with wild or stray animals. Discuss this with your caregiver. Document Released: 02/24/2005 Document Revised: 11/19/2011 Document Reviewed: 09/23/2011 [facility] Patient Information 2015 [place], [place]. This information is not intended to replace advice given to you by your health care provider. Make sure you discuss any questions you have with your health care provider.

## 2013-10-18 NOTE — ED Provider Notes (Signed)
CSN: [id number]     Arrival date & time 10/18/13  1109 History   First MD Initiated Contact with Patient 10/18/13 1125     Chief Complaint  Patient presents with  . possible bat bite      (Consider location/radiation/quality/duration/timing/severity/associated sxs/prior Treatment) The history is provided by the patient and medical records. No language interpreter was used.    Elba TERRIA DESCHEPPER is a 48 y.o. female  with a hx of EDS, chiari deformity, ADHD, chronic pain presents to the Emergency Department complaining of possible bat bite onset Saturday (3 days). Pt reports she was trying to get him out of the house, but was swatting him a towel and thinks she may have gotten bitten.  She reports she found two small bumps on her arm yesterday that have been bothering her and she is concerned she was bitten.  Pt allowed the bat out of the house and he was not caught and tested.  NO alleviating or aggravating factors  Last tetanus was 3 years ago.     Past Medical History  Diagnosis Date  . Arnold-Chiari deformity   . Brain tumor   . HSV infection   . Hypothyroidism   . EDS (Ehlers-Danlos syndrome)   . ADHD (attention deficit hyperactivity disorder)   . Migraine   . Anxiety   . Chronic pain   . Vitamin D deficiency   . Obstructive sleep apnea   . GERD (gastroesophageal reflux disease)   . Hemorrhoids   . Eustachian tube dysfunction   . Other malaise and fatigue 03/28/2013    " The patient reports feeling happy, ED, sore" on she has undergone at Chiari malformation surgery decompression in 2011. Prior to the surgery were chief complaints were weakness numbness and neck problems. She had swallowing difficulties and dizziness;  Classic Chiari presentation. But she didn't have headaches.  . Cerebral venous sinus thrombosis, remote, resolved 03/28/2013  . Nystagmus, positional, central type 03/28/2013   Past Surgical History  Procedure Laterality Date  . Arnold chiari repair    . Dilation and  curettage of uterus    . Cholecystectomy    . Tonsillectomy and adenoidectomy    . Intrauterine device insertion  10/2010    Mirena  . Iud removal  11/2010    could not tolerate hormonal side effects  . Neck fusion c1-c3    . Brain surgery    . Endometrial ablation  02/09/2013    [facility]  . Tubal ligation     Family History  Problem Relation Age of Onset  . Diabetes Maternal Aunt   . Breast cancer Maternal Aunt     40's  . Diabetes Maternal Uncle   . Breast cancer Paternal Aunt 61  . Diabetes Maternal Grandmother   . Cancer Maternal Grandmother     SKIN AND LUNG  . Diabetes Paternal Grandmother   . Breast cancer Maternal Aunt     50's  . Breast cancer Maternal Aunt     50's   History  Substance Use Topics  . Smoking status: Former Smoker    Quit date: 09/09/2007  . Smokeless tobacco: Never Used  . Alcohol Use: No   OB History   Grav Para Term Preterm Abortions TAB SAB Ect Mult Living   2 1 1  1   1  1      Review of Systems  Constitutional: Negative for fever, diaphoresis, appetite change, fatigue and unexpected weight change.  HENT: Negative for mouth sores.  Eyes: Negative for visual disturbance.  Respiratory: Negative for cough, chest tightness, shortness of breath and wheezing.   Cardiovascular: Negative for chest pain.  Gastrointestinal: Negative for nausea, vomiting, abdominal pain, diarrhea and constipation.  Endocrine: Negative for polydipsia, polyphagia and polyuria.  Genitourinary: Negative for dysuria, urgency, frequency and hematuria.  Musculoskeletal: Negative for back pain and neck stiffness.  Skin: Positive for wound. Negative for rash.  Allergic/Immunologic: Negative for immunocompromised state.  Neurological: Negative for syncope, light-headedness and headaches.  Hematological: Does not bruise/bleed easily.  Psychiatric/Behavioral: Negative for sleep disturbance. The patient is not nervous/anxious.       Allergies  Other; Prednisone; and  Strattera  Home Medications   Prior to Admission medications   Medication Sig Start Date End Date Taking? Authorizing Provider  acyclovir (ZOVIRAX) 400 MG tablet Take 1 tablet (400 mg total) by mouth 2 (two) times daily. For 5 days 12/30/12  Yes Anastasio Auerbach, MD  ALPRAZolam Duanne Moron) 0.5 MG tablet Take 0.5 mg by mouth 3 (three) times daily as needed. Anxiety   Yes Historical Provider, MD  buPROPion (WELLBUTRIN XL) 150 MG 24 hr tablet Take 150 mg by mouth daily.   Yes Historical Provider, MD  cyclobenzaprine (FLEXERIL) 10 MG tablet Take 10 mg by mouth daily as needed for muscle spasms.    Yes Historical Provider, MD  diazepam (VALIUM) 5 MG tablet Take 1 tablet (5 mg total) by mouth every 6 (six) hours as needed for anxiety. 02/07/13  Yes Anastasio Auerbach, MD  gabapentin (NEURONTIN) 100 MG capsule Take 200 mg by mouth 4 (four) times daily.   Yes Historical Provider, MD  levothyroxine (SYNTHROID, LEVOTHROID) 125 MCG tablet Take 125 mcg by mouth daily before breakfast.   Yes Historical Provider, MD  Omeprazole Magnesium (PRILOSEC OTC PO) Take 1 capsule by mouth at bedtime.    Yes Historical Provider, MD  Oxycodone HCl 10 MG TABS Take 10 mg by mouth every 4 (four) hours as needed (pain.).    Yes Historical Provider, MD  promethazine (PHENERGAN) 25 MG tablet Take 25 mg by mouth every 6 (six) hours as needed. Nausea   Yes Historical Provider, MD  topiramate (TOPAMAX) 25 MG capsule Take 50 mg by mouth at bedtime.   Yes Historical Provider, MD  amoxicillin-clavulanate (AUGMENTIN) 875-125 MG per tablet Take 1 tablet by mouth every 12 (twelve) hours. 10/18/13   Affan Callow, PA-C   BP 107/71  Pulse 66  Temp(Src) 98.7 F (37.1 C) (Oral)  Resp 17  Wt 219 lb 2.2 oz (99.4 kg)  SpO2 97% Physical Exam  Nursing note and vitals reviewed. Constitutional: She is oriented to person, place, and time. She appears well-developed and well-nourished. No distress.  HENT:  Head: Normocephalic and  atraumatic.  Eyes: Conjunctivae are normal. No scleral icterus.  Neck: Normal range of motion.  Cardiovascular: Normal rate, regular rhythm, normal heart sounds and intact distal pulses.   No murmur heard. Capillary refill < 3 sec  Pulmonary/Chest: Effort normal and breath sounds normal. No respiratory distress.  Musculoskeletal: Normal range of motion. She exhibits no edema.  ROM: full ROM of all joints in the RUE  Neurological: She is alert and oriented to person, place, and time.  Sensation: intact to dull and sharp Strength: 5/5 in the RUE  Skin: Skin is warm and dry. She is not diaphoretic. No erythema.  Two small puncture wounds on the anterior right forearm  Psychiatric: She has a normal mood and affect.    ED Course  Procedures (including critical care time) Labs Review Labs Reviewed - No data to display  Imaging Review No results found.   EKG Interpretation None      MDM   Final diagnoses:  Need for post exposure prophylaxis for rabies  Exposure to bat without known bite   Mikaelah B Dade presents with two small puncture wounds to the right forearm; mild erythema of the site with minimal induration and no evidence of abscess.  No streaking to suggest significant cellulitis.  Pt wounds cleaned with sterile saline.  Pt Alert and oriented, NAD, nontoxic, nonseptic appearing.  Capillary refill intact and pt without neurologic deficit.  NO x-rays indicated.  Patient tetanus UTD < 3 years.  Patient rabies vaccine and immunoglobulin given. Pt without significant pain. We'll discharge home Augmentin and follow-up with Urgent Care or ER for further rabies series.    BP 107/71  Pulse 66  Temp(Src) 98.7 F (37.1 C) (Oral)  Resp 17  Wt 219 lb 2.2 oz (99.4 kg)  SpO2 97%    Abigail Butts, PA-C 10/18/13 1300

## 2013-10-19 NOTE — ED Provider Notes (Signed)
Medical screening examination/treatment/procedure(s) were performed by non-physician practitioner and as supervising physician I was immediately available for consultation/collaboration.  Leota Jacobsen, MD 10/19/13 [phone]

## 2013-10-21 ENCOUNTER — Emergency Department (INDEPENDENT_AMBULATORY_CARE_PROVIDER_SITE_OTHER)
Admission: EM | Admit: 2013-10-21 | Discharge: 2013-10-21 | Disposition: A | Discharge status: Home / Self Care | Payer: Medicare Other | Source: Home / Self Care

## 2013-10-21 ENCOUNTER — Encounter (HOSPITAL_COMMUNITY): Payer: Self-pay | Admitting: Emergency Medicine

## 2013-10-21 DIAGNOSIS — Z203 Contact with and (suspected) exposure to rabies: Secondary | ICD-10-CM

## 2013-10-21 DIAGNOSIS — S51809A Unspecified open wound of unspecified forearm, initial encounter: Secondary | ICD-10-CM | POA: Diagnosis not present

## 2013-10-21 MED ORDER — RABIES VACCINE, PCEC IM SUSR
INTRAMUSCULAR | Status: AC
  Filled 2013-10-21: qty 1

## 2013-10-21 MED ORDER — RABIES VACCINE, PCEC IM SUSR: 1.0000 mL | Freq: Once | INTRAMUSCULAR | Status: DC

## 2013-10-21 NOTE — ED Notes (Signed)
Patient presents for 2nd rabies injection. Denies any reaction to the first. Patient is alert and oriented and in no acute distress. Understands vaccination schedule.

## 2013-10-25 ENCOUNTER — Encounter (HOSPITAL_COMMUNITY): Payer: Self-pay | Admitting: Emergency Medicine

## 2013-10-25 ENCOUNTER — Emergency Department (INDEPENDENT_AMBULATORY_CARE_PROVIDER_SITE_OTHER)
Admission: EM | Admit: 2013-10-25 | Discharge: 2013-10-25 | Disposition: A | Discharge status: Home / Self Care | Payer: Medicare Other | Source: Home / Self Care

## 2013-10-25 DIAGNOSIS — S51809A Unspecified open wound of unspecified forearm, initial encounter: Secondary | ICD-10-CM

## 2013-10-25 DIAGNOSIS — Z203 Contact with and (suspected) exposure to rabies: Secondary | ICD-10-CM

## 2013-10-25 MED ORDER — RABIES VACCINE, PCEC IM SUSR
1.0000 mL | Freq: Once | INTRAMUSCULAR | Status: AC
  Administered 2013-10-21: 1 mL via INTRAMUSCULAR

## 2013-10-25 MED ORDER — RABIES VACCINE, PCEC IM SUSR
1.0000 mL | Freq: Once | INTRAMUSCULAR | Status: AC
  Administered 2013-10-25: 1 mL via INTRAMUSCULAR

## 2013-10-25 MED ORDER — RABIES VACCINE, PCEC IM SUSR
INTRAMUSCULAR | Status: AC
  Filled 2013-10-25: qty 1

## 2013-10-25 NOTE — ED Notes (Signed)
Here for third rabies shot.

## 2013-10-28 DIAGNOSIS — M531 Cervicobrachial syndrome: Secondary | ICD-10-CM | POA: Diagnosis not present

## 2013-10-28 DIAGNOSIS — Q796 Ehlers-Danlos syndrome, unspecified: Secondary | ICD-10-CM | POA: Diagnosis not present

## 2013-10-28 DIAGNOSIS — M2550 Pain in unspecified joint: Secondary | ICD-10-CM | POA: Diagnosis not present

## 2013-10-28 DIAGNOSIS — R51 Headache: Secondary | ICD-10-CM | POA: Diagnosis not present

## 2013-11-01 ENCOUNTER — Emergency Department (HOSPITAL_COMMUNITY)
Admission: EM | Admit: 2013-11-01 | Discharge: 2013-11-01 | Disposition: A | Discharge status: Home / Self Care | Payer: Medicare Other | Attending: Emergency Medicine | Admitting: Emergency Medicine

## 2013-11-01 ENCOUNTER — Encounter (HOSPITAL_COMMUNITY): Payer: Self-pay | Admitting: Emergency Medicine

## 2013-11-01 DIAGNOSIS — T148 Other injury of unspecified body region: Secondary | ICD-10-CM | POA: Insufficient documentation

## 2013-11-01 DIAGNOSIS — Z87891 Personal history of nicotine dependence: Secondary | ICD-10-CM | POA: Diagnosis not present

## 2013-11-01 DIAGNOSIS — Q796 Ehlers-Danlos syndrome, unspecified: Secondary | ICD-10-CM | POA: Diagnosis not present

## 2013-11-01 DIAGNOSIS — F411 Generalized anxiety disorder: Secondary | ICD-10-CM | POA: Insufficient documentation

## 2013-11-01 DIAGNOSIS — W5581XA Bitten by other mammals, initial encounter: Secondary | ICD-10-CM | POA: Diagnosis not present

## 2013-11-01 DIAGNOSIS — T148XXA Other injury of unspecified body region, initial encounter: Secondary | ICD-10-CM

## 2013-11-01 DIAGNOSIS — Z79899 Other long term (current) drug therapy: Secondary | ICD-10-CM | POA: Insufficient documentation

## 2013-11-01 DIAGNOSIS — K219 Gastro-esophageal reflux disease without esophagitis: Secondary | ICD-10-CM | POA: Insufficient documentation

## 2013-11-01 DIAGNOSIS — Z86718 Personal history of other venous thrombosis and embolism: Secondary | ICD-10-CM | POA: Insufficient documentation

## 2013-11-01 DIAGNOSIS — G8929 Other chronic pain: Secondary | ICD-10-CM | POA: Diagnosis not present

## 2013-11-01 DIAGNOSIS — G43909 Migraine, unspecified, not intractable, without status migrainosus: Secondary | ICD-10-CM | POA: Diagnosis not present

## 2013-11-01 DIAGNOSIS — F909 Attention-deficit hyperactivity disorder, unspecified type: Secondary | ICD-10-CM | POA: Diagnosis not present

## 2013-11-01 DIAGNOSIS — E039 Hypothyroidism, unspecified: Secondary | ICD-10-CM | POA: Insufficient documentation

## 2013-11-01 DIAGNOSIS — Z792 Long term (current) use of antibiotics: Secondary | ICD-10-CM | POA: Insufficient documentation

## 2013-11-01 DIAGNOSIS — Z8669 Personal history of other diseases of the nervous system and sense organs: Secondary | ICD-10-CM | POA: Diagnosis not present

## 2013-11-01 DIAGNOSIS — Z23 Encounter for immunization: Secondary | ICD-10-CM

## 2013-11-01 MED ORDER — RABIES VACCINE, PCEC IM SUSR
1.0000 mL | Freq: Once | INTRAMUSCULAR | Status: AC
  Administered 2013-11-01: 1 mL via INTRAMUSCULAR
  Filled 2013-11-01: qty 1

## 2013-11-01 NOTE — ED Provider Notes (Signed)
CSN: [id number]     Arrival date & time 11/01/13  0840 History   First MD Initiated Contact with Patient 11/01/13 [phone]     Chief Complaint  Patient presents with  . Rabies Injection     (Consider location/radiation/quality/duration/timing/severity/associated sxs/prior Treatment) HPI Comments: April Meyer is a 48 y.o. Female who presents to the ED today for recheck of bat bite to R forearm and last rabies vaccine in series. States she's had no issues with the vaccinations so far, the bite mark is nearly fully resolved with no skin changes since onset. Denies fevers, chills, CP, SOB, rashes, myalgias, arthralgias, abd pain, N/V/D, or swollen glands. Denies paresthesias or weakness. Had other 2 vaccines done by urgent care.  Patient is a 48 y.o. female presenting with wound check. The history is provided by the patient. No language interpreter was used.  Wound Check This is a new problem. The current episode started 1 to 4 weeks ago. The problem has been rapidly improving. Pertinent negatives include no abdominal pain, arthralgias, change in bowel habit, chest pain, chills, coughing, diaphoresis, fever, headaches, joint swelling, myalgias, nausea, neck pain, numbness, rash, swollen glands, vomiting or weakness. Nothing aggravates the symptoms. Treatments tried: rabies shots and augmentin. The treatment provided significant relief.    Past Medical History  Diagnosis Date  . Arnold-Chiari deformity   . Brain tumor   . HSV infection   . Hypothyroidism   . EDS (Ehlers-Danlos syndrome)   . ADHD (attention deficit hyperactivity disorder)   . Migraine   . Anxiety   . Chronic pain   . Vitamin D deficiency   . Obstructive sleep apnea   . GERD (gastroesophageal reflux disease)   . Hemorrhoids   . Eustachian tube dysfunction   . Other malaise and fatigue 03/28/2013    " The patient reports feeling happy, ED, sore" on she has undergone at Chiari malformation surgery decompression in 2011. Prior to  the surgery were chief complaints were weakness numbness and neck problems. She had swallowing difficulties and dizziness;  Classic Chiari presentation. But she didn't have headaches.  . Cerebral venous sinus thrombosis, remote, resolved 03/28/2013  . Nystagmus, positional, central type 03/28/2013   Past Surgical History  Procedure Laterality Date  . Arnold chiari repair    . Dilation and curettage of uterus    . Cholecystectomy    . Tonsillectomy and adenoidectomy    . Intrauterine device insertion  10/2010    Mirena  . Iud removal  11/2010    could not tolerate hormonal side effects  . Neck fusion c1-c3    . Brain surgery    . Endometrial ablation  02/09/2013    [facility]  . Tubal ligation     Family History  Problem Relation Age of Onset  . Diabetes Maternal Aunt   . Breast cancer Maternal Aunt     40's  . Diabetes Maternal Uncle   . Breast cancer Paternal Aunt 44  . Diabetes Maternal Grandmother   . Cancer Maternal Grandmother     SKIN AND LUNG  . Diabetes Paternal Grandmother   . Breast cancer Maternal Aunt     50's  . Breast cancer Maternal Aunt     50's   History  Substance Use Topics  . Smoking status: Former Smoker    Quit date: 09/09/2007  . Smokeless tobacco: Never Used  . Alcohol Use: No   OB History   Grav Para Term Preterm Abortions TAB SAB Ect Mult Living  2 1 1  1   1  1      Review of Systems  Constitutional: Negative for fever, chills and diaphoresis.  Respiratory: Negative for cough and shortness of breath.   Cardiovascular: Negative for chest pain.  Gastrointestinal: Negative for nausea, vomiting, abdominal pain, diarrhea and change in bowel habit.  Musculoskeletal: Negative for arthralgias, joint swelling, myalgias and neck pain.  Skin: Positive for wound (resolving). Negative for rash.  Neurological: Negative for dizziness, syncope, weakness, numbness and headaches.  Hematological: Negative for adenopathy.  10 Systems reviewed and are  negative for acute change except as noted in the HPI.     Allergies  Other and Prednisone  Home Medications   Prior to Admission medications   Medication Sig Start Date End Date Taking? Authorizing Provider  acyclovir (ZOVIRAX) 400 MG tablet Take 1 tablet (400 mg total) by mouth 2 (two) times daily. For 5 days 12/30/12   Anastasio Auerbach, MD  ALPRAZolam Duanne Moron) 0.5 MG tablet Take 0.5 mg by mouth 3 (three) times daily as needed. Anxiety    Historical Provider, MD  amoxicillin-clavulanate (AUGMENTIN) 875-125 MG per tablet Take 1 tablet by mouth every 12 (twelve) hours. 10/18/13   Hannah Muthersbaugh, PA-C  buPROPion (WELLBUTRIN XL) 150 MG 24 hr tablet Take 150 mg by mouth daily.    Historical Provider, MD  cyclobenzaprine (FLEXERIL) 10 MG tablet Take 10 mg by mouth daily as needed for muscle spasms.     Historical Provider, MD  diazepam (VALIUM) 5 MG tablet Take 1 tablet (5 mg total) by mouth every 6 (six) hours as needed for anxiety. 02/07/13   Anastasio Auerbach, MD  gabapentin (NEURONTIN) 100 MG capsule Take 200 mg by mouth 4 (four) times daily.    Historical Provider, MD  levothyroxine (SYNTHROID, LEVOTHROID) 125 MCG tablet Take 125 mcg by mouth daily before breakfast.    Historical Provider, MD  Omeprazole Magnesium (PRILOSEC OTC PO) Take 1 capsule by mouth at bedtime.     Historical Provider, MD  Oxycodone HCl 10 MG TABS Take 10 mg by mouth every 4 (four) hours as needed (pain.).     Historical Provider, MD  promethazine (PHENERGAN) 25 MG tablet Take 25 mg by mouth every 6 (six) hours as needed. Nausea    Historical Provider, MD  topiramate (TOPAMAX) 25 MG capsule Take 50 mg by mouth at bedtime.    Historical Provider, MD   BP 116/72  Pulse 65  Temp(Src) 99.1 F (37.3 C) (Oral)  Resp 16  SpO2 98% Physical Exam  Nursing note and vitals reviewed. Constitutional: She is oriented to person, place, and time. Vital signs are normal. She appears well-developed and well-nourished. No  distress.  Afebrile, nontoxic, NAD  HENT:  Head: Normocephalic and atraumatic.  Mouth/Throat: Mucous membranes are normal.  Eyes: Conjunctivae and EOM are normal. Right eye exhibits no discharge. Left eye exhibits no discharge.  Neck: Normal range of motion. Neck supple.  Cardiovascular: Normal rate and intact distal pulses.   Pulmonary/Chest: Effort normal.  Abdominal: Normal appearance. She exhibits no distension.  Musculoskeletal: Normal range of motion.  Moving all extremities with ease, no asymmetry. Baseline sensation and strength intact  Neurological: She is alert and oriented to person, place, and time. She has normal strength. No sensory deficit.  Baseline sensation and strength. A&O x4  Skin: Skin is warm, dry and intact. No rash noted.  Trace bite mark located on R forearm, nearly completely resolved, no erythema or swelling to the area.  Psychiatric: She has a normal mood and affect.    ED Course  Procedures (including critical care time) Labs Review Labs Reviewed - No data to display  Imaging Review No results found.   EKG Interpretation None      MDM   Final diagnoses:  Rabies, need for prophylactic vaccination against  Bite by animal    48y/o female here for last dose of rabies vaccine. Doing well, no complaints. Afebrile, nontoxic, no s/sx of encephalopathy or complications. Will give vaccine and d/c home.  BP 116/72  Pulse 65  Temp(Src) 98.6 F (37 C) (Oral)  Resp 16  SpO2 98%  Meds ordered this encounter  Medications  . rabies vaccine (RABAVERT) injection 1 mL    Sig:        Patty Sermons Camprubi-Soms, PA-C 11/01/13 1044

## 2013-11-01 NOTE — Discharge Instructions (Signed)
You received your last rabies vaccine today. If you have any reactions, return to the ER. Follow up with your primary doctor as needed.   Animal Bite An animal bite can result in a scratch on the skin, deep open cut, puncture of the skin, crush injury, or tearing away of the skin or a body part. Dogs are responsible for most animal bites. Children are bitten more often than adults. An animal bite can range from very mild to more serious. A small bite from your house pet is no cause for alarm. However, some animal bites can become infected or injure a bone or other tissue. You must seek medical care if:  The skin is broken and bleeding does not slow down or stop after 15 minutes.  The puncture is deep and difficult to clean (such as a cat bite).  Pain, warmth, redness, or pus develops around the wound.  The bite is from a stray animal or rodent. There may be a risk of rabies infection.  The bite is from a snake, raccoon, skunk, fox, coyote, or bat. There may be a risk of rabies infection.  The person bitten has a chronic illness such as diabetes, liver disease, or cancer, or the person takes medicine that lowers the immune system.  There is concern about the location and severity of the bite. It is important to clean and protect an animal bite wound right away to prevent infection. Follow these steps:  Clean the wound with plenty of water and soap.  Apply an antibiotic cream.  Apply gentle pressure over the wound with a clean towel or gauze to slow or stop bleeding.  Elevate the affected area above the heart to help stop any bleeding.  Seek medical care. Getting medical care within 8 hours of the animal bite leads to the best possible outcome. DIAGNOSIS  Your caregiver will most likely:  Take a detailed history of the animal and the bite injury.  Perform a wound exam.  Take your medical history. Blood tests or X-rays may be performed. Sometimes, infected bite wounds are cultured  and sent to a lab to identify the infectious bacteria.  TREATMENT  Medical treatment will depend on the location and type of animal bite as well as the patient's medical history. Treatment may include:  Wound care, such as cleaning and flushing the wound with saline solution, bandaging, and elevating the affected area.  Antibiotics.  Tetanus immunization.  Rabies immunization.  Leaving the wound open to heal. This is often done with animal bites, due to the high risk of infection. However, in certain cases, wound closure with stitches, wound adhesive, skin adhesive strips, or staples may be used. Infected bites that are left untreated may require intravenous (IV) antibiotics and surgical treatment in the hospital. [place]  Follow your caregiver's instructions for wound care.  Take all medicines as directed.  If your caregiver prescribes antibiotics, take them as directed. Finish them even if you start to feel better.  Follow up with your caregiver for further exams or immunizations as directed. You may need a tetanus shot if:  You cannot remember when you had your last tetanus shot.  You have never had a tetanus shot.  The injury broke your skin. If you get a tetanus shot, your arm may swell, get red, and feel warm to the touch. This is common and not a problem. If you need a tetanus shot and you choose not to have one, there is a rare  chance of getting tetanus. Sickness from tetanus can be serious. SEEK MEDICAL CARE IF:  You notice warmth, redness, soreness, swelling, pus discharge, or a bad smell coming from the wound.  You have a red line on the skin coming from the wound.  You have a fever, chills, or a general ill feeling.  You have nausea or vomiting.  You have continued or worsening pain.  You have trouble moving the injured part.  You have other questions or concerns. MAKE SURE YOU:  Understand these instructions.  Will watch your  condition.  Will get help right away if you are not doing well or get worse. Document Released: 11/12/2010 Document Revised: 05/19/2011 Document Reviewed: 11/12/2010 [facility] Patient Information 2015 [place], [place]. This information is not intended to replace advice given to you by your health care provider. Make sure you discuss any questions you have with your health care provider.  Rabies  Rabies is a viral infection that can be spread to people from infected animals. The infection affects the brain and central nervous system. Once the disease develops, it almost always causes death. Because of this, when a person is bitten by an animal that may have rabies, treatment to prevent rabies often needs to be started whether or not the animal is known to be infected. Prompt treatment with the rabies vaccine and rabies immune globulin is very effective at preventing the infection from developing in people who have been exposed to the rabies virus. CAUSES  Rabies is caused by a virus that lives inside some animals. When a person is bitten by an infected animal, the rabies virus is spread to the person through the infected spit (saliva) of the animal. This virus can be carried by animals such as dogs, cats, skunks, bats, woodchucks, raccoons, coyotes, and foxes. SYMPTOMS  By the time symptoms appear, rabies is usually fatal for the person. Common symptoms include:  Headache.  Fever.  Fatigue and weakness.  Agitation.  Anxiety.  Confusion.  Unusual behavior, such as hyperactivity, fear of water (hydrophobia), or fear of air (aerophobia).  Hallucinations.  Insomnia.  Weakness in the arms or legs.  Difficulty swallowing. Most people get sick in 1-3 months after being bitten. This often varies and may depend on the location of the bite. The infection will take less time to develop if the bite occurred closer to the head.  DIAGNOSIS  To determine if a person is infected, several tests must  be performed, such as:  A skin biopsy.  A saliva test.  A lumbar puncture to remove spinal fluid so it can be examined.  Blood tests. TREATMENT  Treatment to prevent the infection from developing (post-exposure prophylaxis, PEP) is often started before knowing for sure if the person has been exposed to the rabies virus. PEP involves cleaning the wound, giving an antibody injection (rabies immune globulin), and giving a series of rabies vaccine injections. The series of injections are usually given over a two-week period. If possible, the animal that bit the person will be observed to see if it remains healthy. If the animal has been killed, it can be sent to a state laboratory and examined to see if the animal had rabies. If a person is bitten by a domestic animal (dog, cat, or ferret) that appears healthy and can be observed to see if it remains healthy, often no further treatment is necessary other than care of the wounds caused by the animal. Rabies is often a fatal illness once the infection  develops in a person. Although a few people who developed rabies have survived after experimental treatment with certain drugs, all these survivors still had severe nervous system problems after the treatment. This is why caregivers use extra caution and begin PEP treatment for people who have been bitten by animals that are possibly infected with rabies.  HOME CARE INSTRUCTIONS  If you were bitten by an unknown animal, make sure you know your caregiver's instructions for follow-up. If the animal was sent to a laboratory for examination, ask when the test results will be ready. Make sure you get the test results.  Take these steps to care for your wound:  Keep the wound clean, dry, and dressed as directed by your caregiver.  Keep the injured part elevated as much as possible.  Do not resume use of the affected area until directed.  Only take over-the-counter or prescription medicines as directed by  your caregiver.  Keep all follow-up appointments as directed by your caregiver. PREVENTION  To prevent rabies, people need to reduce their risk of having contact with infected animals.   Make sure your pets (dogs, cats, ferrets) are vaccinated against rabies. Keep these vaccinations up-to-date as directed by your veterinarian.  Supervise your pets when they are outside. Keep them away from wild animals.  Call your local animal control services to report any stray animals. These animals may not be vaccinated.  Stay away from stray or wild animals.  Consider getting the rabies vaccine (preexposure) if you are traveling to an area where rabies is common or if your job or activities involve possible contact with wild or stray animals. Discuss this with your caregiver. Document Released: 02/24/2005 Document Revised: 11/19/2011 Document Reviewed: 09/23/2011 [facility] Patient Information 2015 [place], [place]. This information is not intended to replace advice given to you by your health care provider. Make sure you discuss any questions you have with your health care provider.

## 2013-11-01 NOTE — ED Provider Notes (Signed)
Medical screening examination/treatment/procedure(s) were performed by non-physician practitioner and as supervising physician I was immediately available for consultation/collaboration.   EKG Interpretation None        Orpah Greek, MD 11/01/13 1050

## 2013-11-24 DIAGNOSIS — L905 Scar conditions and fibrosis of skin: Secondary | ICD-10-CM | POA: Diagnosis not present

## 2013-11-24 DIAGNOSIS — D1801 Hemangioma of skin and subcutaneous tissue: Secondary | ICD-10-CM | POA: Diagnosis not present

## 2013-11-24 DIAGNOSIS — D2360 Other benign neoplasm of skin of unspecified upper limb, including shoulder: Secondary | ICD-10-CM | POA: Diagnosis not present

## 2013-11-24 DIAGNOSIS — L821 Other seborrheic keratosis: Secondary | ICD-10-CM | POA: Diagnosis not present

## 2013-11-24 DIAGNOSIS — L719 Rosacea, unspecified: Secondary | ICD-10-CM | POA: Diagnosis not present

## 2013-12-01 DIAGNOSIS — G894 Chronic pain syndrome: Secondary | ICD-10-CM | POA: Diagnosis not present

## 2013-12-01 DIAGNOSIS — Q796 Ehlers-Danlos syndrome, unspecified: Secondary | ICD-10-CM | POA: Diagnosis not present

## 2013-12-01 DIAGNOSIS — Z79899 Other long term (current) drug therapy: Secondary | ICD-10-CM | POA: Diagnosis not present

## 2013-12-16 DIAGNOSIS — E039 Hypothyroidism, unspecified: Secondary | ICD-10-CM | POA: Diagnosis not present

## 2013-12-16 DIAGNOSIS — E559 Vitamin D deficiency, unspecified: Secondary | ICD-10-CM | POA: Diagnosis not present

## 2013-12-16 DIAGNOSIS — F902 Attention-deficit hyperactivity disorder, combined type: Secondary | ICD-10-CM | POA: Diagnosis not present

## 2013-12-16 DIAGNOSIS — R7309 Other abnormal glucose: Secondary | ICD-10-CM | POA: Diagnosis not present

## 2013-12-21 DIAGNOSIS — E039 Hypothyroidism, unspecified: Secondary | ICD-10-CM | POA: Diagnosis not present

## 2013-12-21 DIAGNOSIS — Q059 Spina bifida, unspecified: Secondary | ICD-10-CM | POA: Diagnosis not present

## 2013-12-21 DIAGNOSIS — F329 Major depressive disorder, single episode, unspecified: Secondary | ICD-10-CM | POA: Diagnosis not present

## 2013-12-21 DIAGNOSIS — Z23 Encounter for immunization: Secondary | ICD-10-CM | POA: Diagnosis not present

## 2013-12-28 ENCOUNTER — Telehealth: Payer: Self-pay | Admitting: *Deleted

## 2013-12-28 DIAGNOSIS — G894 Chronic pain syndrome: Secondary | ICD-10-CM | POA: Diagnosis not present

## 2013-12-28 DIAGNOSIS — Q796 Ehlers-Danlos syndrome: Secondary | ICD-10-CM | POA: Diagnosis not present

## 2013-12-28 DIAGNOSIS — M961 Postlaminectomy syndrome, not elsewhere classified: Secondary | ICD-10-CM | POA: Diagnosis not present

## 2013-12-28 DIAGNOSIS — R1031 Right lower quadrant pain: Secondary | ICD-10-CM

## 2013-12-28 DIAGNOSIS — M5481 Occipital neuralgia: Secondary | ICD-10-CM | POA: Diagnosis not present

## 2013-12-28 NOTE — Telephone Encounter (Signed)
Okay for ultrasound and exam appointment afterwards

## 2013-12-28 NOTE — Telephone Encounter (Signed)
Pt called c/o right side ovary pain x 2 days now, asked if she can have ultrasound order placed for OV? Pt said she has had ovary cyst in past and this feels the same. Please advise

## 2013-12-28 NOTE — Telephone Encounter (Signed)
Order placed, front desk will inform

## 2014-01-09 ENCOUNTER — Encounter (HOSPITAL_COMMUNITY): Payer: Self-pay | Admitting: Emergency Medicine

## 2014-01-25 DIAGNOSIS — Z79891 Long term (current) use of opiate analgesic: Secondary | ICD-10-CM | POA: Diagnosis not present

## 2014-01-25 DIAGNOSIS — G894 Chronic pain syndrome: Secondary | ICD-10-CM | POA: Diagnosis not present

## 2014-01-25 DIAGNOSIS — Q796 Ehlers-Danlos syndrome: Secondary | ICD-10-CM | POA: Diagnosis not present

## 2014-01-25 DIAGNOSIS — M961 Postlaminectomy syndrome, not elsewhere classified: Secondary | ICD-10-CM | POA: Diagnosis not present

## 2014-02-07 DIAGNOSIS — G930 Cerebral cysts: Secondary | ICD-10-CM | POA: Diagnosis not present

## 2014-02-07 DIAGNOSIS — M542 Cervicalgia: Secondary | ICD-10-CM | POA: Diagnosis not present

## 2014-02-07 DIAGNOSIS — I676 Nonpyogenic thrombosis of intracranial venous system: Secondary | ICD-10-CM | POA: Diagnosis not present

## 2014-02-07 DIAGNOSIS — M791 Myalgia: Secondary | ICD-10-CM | POA: Diagnosis not present

## 2014-02-07 DIAGNOSIS — G4489 Other headache syndrome: Secondary | ICD-10-CM | POA: Diagnosis not present

## 2014-02-08 ENCOUNTER — Other Ambulatory Visit: Payer: Self-pay | Admitting: Neurology

## 2014-02-08 DIAGNOSIS — G939 Disorder of brain, unspecified: Secondary | ICD-10-CM

## 2014-02-18 ENCOUNTER — Ambulatory Visit
Admission: RE | Admit: 2014-02-18 | Discharge: 2014-02-18 | Disposition: A | Discharge status: Home / Self Care | Payer: 59 | Source: Ambulatory Visit | Attending: Neurology | Admitting: Neurology

## 2014-02-18 ENCOUNTER — Ambulatory Visit
Admission: RE | Admit: 2014-02-18 | Discharge: 2014-02-18 | Disposition: A | Discharge status: Home / Self Care | Payer: Medicare Other | Source: Ambulatory Visit | Attending: Neurology | Admitting: Neurology

## 2014-02-18 DIAGNOSIS — G08 Intracranial and intraspinal phlebitis and thrombophlebitis: Secondary | ICD-10-CM | POA: Diagnosis not present

## 2014-02-18 DIAGNOSIS — G939 Disorder of brain, unspecified: Secondary | ICD-10-CM

## 2014-02-18 DIAGNOSIS — R51 Headache: Secondary | ICD-10-CM | POA: Diagnosis not present

## 2014-02-18 MED ORDER — GADOBENATE DIMEGLUMINE 529 MG/ML IV SOLN
20.0000 mL | Freq: Once | INTRAVENOUS | Status: AC | PRN
  Administered 2014-02-18: 20 mL via INTRAVENOUS

## 2014-02-22 DIAGNOSIS — Q796 Ehlers-Danlos syndrome: Secondary | ICD-10-CM | POA: Diagnosis not present

## 2014-02-22 DIAGNOSIS — Z79891 Long term (current) use of opiate analgesic: Secondary | ICD-10-CM | POA: Diagnosis not present

## 2014-02-22 DIAGNOSIS — G894 Chronic pain syndrome: Secondary | ICD-10-CM | POA: Diagnosis not present

## 2014-02-22 DIAGNOSIS — M961 Postlaminectomy syndrome, not elsewhere classified: Secondary | ICD-10-CM | POA: Diagnosis not present

## 2014-03-16 DIAGNOSIS — J029 Acute pharyngitis, unspecified: Secondary | ICD-10-CM | POA: Diagnosis not present

## 2014-04-05 DIAGNOSIS — J45909 Unspecified asthma, uncomplicated: Secondary | ICD-10-CM | POA: Diagnosis not present

## 2014-04-05 DIAGNOSIS — J029 Acute pharyngitis, unspecified: Secondary | ICD-10-CM | POA: Diagnosis not present

## 2014-04-19 DIAGNOSIS — G894 Chronic pain syndrome: Secondary | ICD-10-CM | POA: Diagnosis not present

## 2014-04-19 DIAGNOSIS — Z79891 Long term (current) use of opiate analgesic: Secondary | ICD-10-CM | POA: Diagnosis not present

## 2014-04-19 DIAGNOSIS — M961 Postlaminectomy syndrome, not elsewhere classified: Secondary | ICD-10-CM | POA: Diagnosis not present

## 2014-04-19 DIAGNOSIS — Q796 Ehlers-Danlos syndrome: Secondary | ICD-10-CM | POA: Diagnosis not present

## 2014-05-08 DIAGNOSIS — Q796 Ehlers-Danlos syndrome: Secondary | ICD-10-CM | POA: Diagnosis not present

## 2014-05-08 DIAGNOSIS — K219 Gastro-esophageal reflux disease without esophagitis: Secondary | ICD-10-CM | POA: Diagnosis not present

## 2014-05-23 DIAGNOSIS — K449 Diaphragmatic hernia without obstruction or gangrene: Secondary | ICD-10-CM | POA: Diagnosis not present

## 2014-05-23 DIAGNOSIS — K219 Gastro-esophageal reflux disease without esophagitis: Secondary | ICD-10-CM | POA: Diagnosis not present

## 2014-05-26 ENCOUNTER — Other Ambulatory Visit: Payer: Self-pay | Admitting: Dermatology

## 2014-05-26 DIAGNOSIS — C44612 Basal cell carcinoma of skin of right upper limb, including shoulder: Secondary | ICD-10-CM | POA: Diagnosis not present

## 2014-05-26 DIAGNOSIS — L821 Other seborrheic keratosis: Secondary | ICD-10-CM | POA: Diagnosis not present

## 2014-05-26 DIAGNOSIS — L720 Epidermal cyst: Secondary | ICD-10-CM | POA: Diagnosis not present

## 2014-05-26 DIAGNOSIS — D2261 Melanocytic nevi of right upper limb, including shoulder: Secondary | ICD-10-CM | POA: Diagnosis not present

## 2014-05-26 DIAGNOSIS — D2262 Melanocytic nevi of left upper limb, including shoulder: Secondary | ICD-10-CM | POA: Diagnosis not present

## 2014-06-01 DIAGNOSIS — Z1231 Encounter for screening mammogram for malignant neoplasm of breast: Secondary | ICD-10-CM | POA: Diagnosis not present

## 2014-06-01 DIAGNOSIS — Z803 Family history of malignant neoplasm of breast: Secondary | ICD-10-CM | POA: Diagnosis not present

## 2014-06-16 DIAGNOSIS — M961 Postlaminectomy syndrome, not elsewhere classified: Secondary | ICD-10-CM | POA: Diagnosis not present

## 2014-06-16 DIAGNOSIS — G894 Chronic pain syndrome: Secondary | ICD-10-CM | POA: Diagnosis not present

## 2014-06-16 DIAGNOSIS — Q796 Ehlers-Danlos syndrome: Secondary | ICD-10-CM | POA: Diagnosis not present

## 2014-06-16 DIAGNOSIS — Z79891 Long term (current) use of opiate analgesic: Secondary | ICD-10-CM | POA: Diagnosis not present

## 2014-06-23 DIAGNOSIS — E039 Hypothyroidism, unspecified: Secondary | ICD-10-CM | POA: Diagnosis not present

## 2014-06-29 DIAGNOSIS — E039 Hypothyroidism, unspecified: Secondary | ICD-10-CM | POA: Diagnosis not present

## 2014-07-14 DIAGNOSIS — G4489 Other headache syndrome: Secondary | ICD-10-CM | POA: Diagnosis not present

## 2014-07-14 DIAGNOSIS — M542 Cervicalgia: Secondary | ICD-10-CM | POA: Diagnosis not present

## 2014-07-14 DIAGNOSIS — G43719 Chronic migraine without aura, intractable, without status migrainosus: Secondary | ICD-10-CM | POA: Diagnosis not present

## 2014-08-11 DIAGNOSIS — M961 Postlaminectomy syndrome, not elsewhere classified: Secondary | ICD-10-CM | POA: Diagnosis not present

## 2014-08-11 DIAGNOSIS — Z79891 Long term (current) use of opiate analgesic: Secondary | ICD-10-CM | POA: Diagnosis not present

## 2014-08-11 DIAGNOSIS — Q796 Ehlers-Danlos syndrome: Secondary | ICD-10-CM | POA: Diagnosis not present

## 2014-08-11 DIAGNOSIS — G894 Chronic pain syndrome: Secondary | ICD-10-CM | POA: Diagnosis not present

## 2014-09-26 DIAGNOSIS — E039 Hypothyroidism, unspecified: Secondary | ICD-10-CM | POA: Diagnosis not present

## 2014-10-03 DIAGNOSIS — E039 Hypothyroidism, unspecified: Secondary | ICD-10-CM | POA: Diagnosis not present

## 2014-10-03 DIAGNOSIS — Q059 Spina bifida, unspecified: Secondary | ICD-10-CM | POA: Diagnosis not present

## 2014-10-03 DIAGNOSIS — F909 Attention-deficit hyperactivity disorder, unspecified type: Secondary | ICD-10-CM | POA: Diagnosis not present

## 2014-10-03 DIAGNOSIS — F419 Anxiety disorder, unspecified: Secondary | ICD-10-CM | POA: Diagnosis not present

## 2014-10-04 DIAGNOSIS — G4489 Other headache syndrome: Secondary | ICD-10-CM | POA: Diagnosis not present

## 2014-10-04 DIAGNOSIS — M542 Cervicalgia: Secondary | ICD-10-CM | POA: Diagnosis not present

## 2014-10-04 DIAGNOSIS — G43709 Chronic migraine without aura, not intractable, without status migrainosus: Secondary | ICD-10-CM | POA: Diagnosis not present

## 2014-10-05 DIAGNOSIS — M961 Postlaminectomy syndrome, not elsewhere classified: Secondary | ICD-10-CM | POA: Diagnosis not present

## 2014-10-05 DIAGNOSIS — G894 Chronic pain syndrome: Secondary | ICD-10-CM | POA: Diagnosis not present

## 2014-10-05 DIAGNOSIS — Z79891 Long term (current) use of opiate analgesic: Secondary | ICD-10-CM | POA: Diagnosis not present

## 2014-10-05 DIAGNOSIS — Q796 Ehlers-Danlos syndrome: Secondary | ICD-10-CM | POA: Diagnosis not present

## 2014-11-13 DIAGNOSIS — H6693 Otitis media, unspecified, bilateral: Secondary | ICD-10-CM | POA: Diagnosis not present

## 2014-11-30 DIAGNOSIS — Z79891 Long term (current) use of opiate analgesic: Secondary | ICD-10-CM | POA: Diagnosis not present

## 2014-11-30 DIAGNOSIS — M961 Postlaminectomy syndrome, not elsewhere classified: Secondary | ICD-10-CM | POA: Diagnosis not present

## 2014-11-30 DIAGNOSIS — Q796 Ehlers-Danlos syndrome: Secondary | ICD-10-CM | POA: Diagnosis not present

## 2014-11-30 DIAGNOSIS — L821 Other seborrheic keratosis: Secondary | ICD-10-CM | POA: Diagnosis not present

## 2014-11-30 DIAGNOSIS — G894 Chronic pain syndrome: Secondary | ICD-10-CM | POA: Diagnosis not present

## 2014-11-30 DIAGNOSIS — Z85828 Personal history of other malignant neoplasm of skin: Secondary | ICD-10-CM | POA: Diagnosis not present

## 2014-11-30 DIAGNOSIS — L820 Inflamed seborrheic keratosis: Secondary | ICD-10-CM | POA: Diagnosis not present

## 2014-11-30 DIAGNOSIS — L812 Freckles: Secondary | ICD-10-CM | POA: Diagnosis not present

## 2014-11-30 DIAGNOSIS — L738 Other specified follicular disorders: Secondary | ICD-10-CM | POA: Diagnosis not present

## 2014-12-18 DIAGNOSIS — E039 Hypothyroidism, unspecified: Secondary | ICD-10-CM | POA: Diagnosis not present

## 2014-12-18 DIAGNOSIS — R109 Unspecified abdominal pain: Secondary | ICD-10-CM | POA: Diagnosis not present

## 2014-12-18 DIAGNOSIS — F419 Anxiety disorder, unspecified: Secondary | ICD-10-CM | POA: Diagnosis not present

## 2014-12-21 ENCOUNTER — Other Ambulatory Visit: Payer: Self-pay | Admitting: Internal Medicine

## 2014-12-21 DIAGNOSIS — R109 Unspecified abdominal pain: Secondary | ICD-10-CM

## 2014-12-25 DIAGNOSIS — R197 Diarrhea, unspecified: Secondary | ICD-10-CM | POA: Diagnosis not present

## 2014-12-25 DIAGNOSIS — K219 Gastro-esophageal reflux disease without esophagitis: Secondary | ICD-10-CM | POA: Diagnosis not present

## 2014-12-25 DIAGNOSIS — R1011 Right upper quadrant pain: Secondary | ICD-10-CM | POA: Diagnosis not present

## 2014-12-25 DIAGNOSIS — R112 Nausea with vomiting, unspecified: Secondary | ICD-10-CM | POA: Diagnosis not present

## 2014-12-26 ENCOUNTER — Other Ambulatory Visit: Payer: 59

## 2015-01-29 DIAGNOSIS — G894 Chronic pain syndrome: Secondary | ICD-10-CM | POA: Diagnosis not present

## 2015-01-29 DIAGNOSIS — Z79891 Long term (current) use of opiate analgesic: Secondary | ICD-10-CM | POA: Diagnosis not present

## 2015-01-29 DIAGNOSIS — Q796 Ehlers-Danlos syndrome: Secondary | ICD-10-CM | POA: Diagnosis not present

## 2015-01-29 DIAGNOSIS — M961 Postlaminectomy syndrome, not elsewhere classified: Secondary | ICD-10-CM | POA: Diagnosis not present

## 2015-02-15 DIAGNOSIS — H6690 Otitis media, unspecified, unspecified ear: Secondary | ICD-10-CM | POA: Diagnosis not present

## 2015-02-15 DIAGNOSIS — J019 Acute sinusitis, unspecified: Secondary | ICD-10-CM | POA: Diagnosis not present

## 2015-03-10 DIAGNOSIS — J0140 Acute pansinusitis, unspecified: Secondary | ICD-10-CM | POA: Diagnosis not present

## 2015-03-10 DIAGNOSIS — J209 Acute bronchitis, unspecified: Secondary | ICD-10-CM | POA: Diagnosis not present

## 2015-03-14 DIAGNOSIS — R938 Abnormal findings on diagnostic imaging of other specified body structures: Secondary | ICD-10-CM | POA: Diagnosis not present

## 2015-03-14 DIAGNOSIS — J189 Pneumonia, unspecified organism: Secondary | ICD-10-CM | POA: Diagnosis not present

## 2015-03-26 DIAGNOSIS — G894 Chronic pain syndrome: Secondary | ICD-10-CM | POA: Diagnosis not present

## 2015-03-26 DIAGNOSIS — M961 Postlaminectomy syndrome, not elsewhere classified: Secondary | ICD-10-CM | POA: Diagnosis not present

## 2015-03-26 DIAGNOSIS — Z79891 Long term (current) use of opiate analgesic: Secondary | ICD-10-CM | POA: Diagnosis not present

## 2015-03-26 DIAGNOSIS — Q796 Ehlers-Danlos syndrome: Secondary | ICD-10-CM | POA: Diagnosis not present

## 2015-04-05 DIAGNOSIS — E039 Hypothyroidism, unspecified: Secondary | ICD-10-CM | POA: Diagnosis not present

## 2015-04-10 DIAGNOSIS — R739 Hyperglycemia, unspecified: Secondary | ICD-10-CM | POA: Diagnosis not present

## 2015-04-10 DIAGNOSIS — E039 Hypothyroidism, unspecified: Secondary | ICD-10-CM | POA: Diagnosis not present

## 2015-04-10 DIAGNOSIS — F419 Anxiety disorder, unspecified: Secondary | ICD-10-CM | POA: Diagnosis not present

## 2015-05-10 DIAGNOSIS — H5213 Myopia, bilateral: Secondary | ICD-10-CM | POA: Diagnosis not present

## 2015-05-15 DIAGNOSIS — G894 Chronic pain syndrome: Secondary | ICD-10-CM | POA: Diagnosis not present

## 2015-05-15 DIAGNOSIS — M961 Postlaminectomy syndrome, not elsewhere classified: Secondary | ICD-10-CM | POA: Diagnosis not present

## 2015-05-15 DIAGNOSIS — Z79891 Long term (current) use of opiate analgesic: Secondary | ICD-10-CM | POA: Diagnosis not present

## 2015-05-15 DIAGNOSIS — Q796 Ehlers-Danlos syndrome: Secondary | ICD-10-CM | POA: Diagnosis not present

## 2015-05-22 ENCOUNTER — Other Ambulatory Visit: Payer: Self-pay

## 2015-05-22 DIAGNOSIS — Z1231 Encounter for screening mammogram for malignant neoplasm of breast: Secondary | ICD-10-CM

## 2015-06-04 DIAGNOSIS — J029 Acute pharyngitis, unspecified: Secondary | ICD-10-CM | POA: Diagnosis not present

## 2015-06-04 DIAGNOSIS — J101 Influenza due to other identified influenza virus with other respiratory manifestations: Secondary | ICD-10-CM | POA: Diagnosis not present

## 2015-06-04 DIAGNOSIS — J45998 Other asthma: Secondary | ICD-10-CM | POA: Diagnosis not present

## 2015-06-08 ENCOUNTER — Ambulatory Visit
Admission: RE | Admit: 2015-06-08 | Discharge: 2015-06-08 | Disposition: A | Discharge status: Home / Self Care | Payer: PPO | Source: Ambulatory Visit

## 2015-06-08 DIAGNOSIS — Z1231 Encounter for screening mammogram for malignant neoplasm of breast: Secondary | ICD-10-CM | POA: Diagnosis not present

## 2015-07-18 DIAGNOSIS — Q796 Ehlers-Danlos syndrome: Secondary | ICD-10-CM | POA: Diagnosis not present

## 2015-07-18 DIAGNOSIS — Z79891 Long term (current) use of opiate analgesic: Secondary | ICD-10-CM | POA: Diagnosis not present

## 2015-07-18 DIAGNOSIS — G894 Chronic pain syndrome: Secondary | ICD-10-CM | POA: Diagnosis not present

## 2015-07-18 DIAGNOSIS — M961 Postlaminectomy syndrome, not elsewhere classified: Secondary | ICD-10-CM | POA: Diagnosis not present

## 2015-08-02 DIAGNOSIS — E039 Hypothyroidism, unspecified: Secondary | ICD-10-CM | POA: Diagnosis not present

## 2015-08-02 DIAGNOSIS — R739 Hyperglycemia, unspecified: Secondary | ICD-10-CM | POA: Diagnosis not present

## 2015-08-07 DIAGNOSIS — M545 Low back pain: Secondary | ICD-10-CM | POA: Diagnosis not present

## 2015-08-07 DIAGNOSIS — M9903 Segmental and somatic dysfunction of lumbar region: Secondary | ICD-10-CM | POA: Diagnosis not present

## 2015-08-07 DIAGNOSIS — S338XXA Sprain of other parts of lumbar spine and pelvis, initial encounter: Secondary | ICD-10-CM | POA: Diagnosis not present

## 2015-08-07 DIAGNOSIS — E039 Hypothyroidism, unspecified: Secondary | ICD-10-CM | POA: Diagnosis not present

## 2015-08-07 DIAGNOSIS — M25512 Pain in left shoulder: Secondary | ICD-10-CM | POA: Diagnosis not present

## 2015-08-07 DIAGNOSIS — G8929 Other chronic pain: Secondary | ICD-10-CM | POA: Diagnosis not present

## 2015-08-09 DIAGNOSIS — S338XXA Sprain of other parts of lumbar spine and pelvis, initial encounter: Secondary | ICD-10-CM | POA: Diagnosis not present

## 2015-08-09 DIAGNOSIS — M9903 Segmental and somatic dysfunction of lumbar region: Secondary | ICD-10-CM | POA: Diagnosis not present

## 2015-08-11 DIAGNOSIS — M9903 Segmental and somatic dysfunction of lumbar region: Secondary | ICD-10-CM | POA: Diagnosis not present

## 2015-08-11 DIAGNOSIS — S338XXA Sprain of other parts of lumbar spine and pelvis, initial encounter: Secondary | ICD-10-CM | POA: Diagnosis not present

## 2015-08-14 DIAGNOSIS — S338XXA Sprain of other parts of lumbar spine and pelvis, initial encounter: Secondary | ICD-10-CM | POA: Diagnosis not present

## 2015-08-14 DIAGNOSIS — M9903 Segmental and somatic dysfunction of lumbar region: Secondary | ICD-10-CM | POA: Diagnosis not present

## 2015-08-16 DIAGNOSIS — S338XXA Sprain of other parts of lumbar spine and pelvis, initial encounter: Secondary | ICD-10-CM | POA: Diagnosis not present

## 2015-08-16 DIAGNOSIS — M9903 Segmental and somatic dysfunction of lumbar region: Secondary | ICD-10-CM | POA: Diagnosis not present

## 2015-08-21 DIAGNOSIS — M47815 Spondylosis without myelopathy or radiculopathy, thoracolumbar region: Secondary | ICD-10-CM | POA: Diagnosis not present

## 2015-08-21 DIAGNOSIS — M549 Dorsalgia, unspecified: Secondary | ICD-10-CM | POA: Diagnosis not present

## 2015-08-21 DIAGNOSIS — S338XXA Sprain of other parts of lumbar spine and pelvis, initial encounter: Secondary | ICD-10-CM | POA: Diagnosis not present

## 2015-08-21 DIAGNOSIS — M9903 Segmental and somatic dysfunction of lumbar region: Secondary | ICD-10-CM | POA: Diagnosis not present

## 2015-08-21 DIAGNOSIS — M25559 Pain in unspecified hip: Secondary | ICD-10-CM | POA: Diagnosis not present

## 2015-08-28 DIAGNOSIS — S338XXA Sprain of other parts of lumbar spine and pelvis, initial encounter: Secondary | ICD-10-CM | POA: Diagnosis not present

## 2015-08-28 DIAGNOSIS — M9903 Segmental and somatic dysfunction of lumbar region: Secondary | ICD-10-CM | POA: Diagnosis not present

## 2015-09-06 DIAGNOSIS — S338XXA Sprain of other parts of lumbar spine and pelvis, initial encounter: Secondary | ICD-10-CM | POA: Diagnosis not present

## 2015-09-06 DIAGNOSIS — M9903 Segmental and somatic dysfunction of lumbar region: Secondary | ICD-10-CM | POA: Diagnosis not present

## 2015-09-19 DIAGNOSIS — G894 Chronic pain syndrome: Secondary | ICD-10-CM | POA: Diagnosis not present

## 2015-09-19 DIAGNOSIS — Z79891 Long term (current) use of opiate analgesic: Secondary | ICD-10-CM | POA: Diagnosis not present

## 2015-09-19 DIAGNOSIS — Q796 Ehlers-Danlos syndrome: Secondary | ICD-10-CM | POA: Diagnosis not present

## 2015-09-19 DIAGNOSIS — M961 Postlaminectomy syndrome, not elsewhere classified: Secondary | ICD-10-CM | POA: Diagnosis not present

## 2015-11-01 DIAGNOSIS — K048 Radicular cyst: Secondary | ICD-10-CM | POA: Diagnosis not present

## 2015-11-14 DIAGNOSIS — M961 Postlaminectomy syndrome, not elsewhere classified: Secondary | ICD-10-CM | POA: Diagnosis not present

## 2015-11-14 DIAGNOSIS — G894 Chronic pain syndrome: Secondary | ICD-10-CM | POA: Diagnosis not present

## 2015-11-14 DIAGNOSIS — Z79891 Long term (current) use of opiate analgesic: Secondary | ICD-10-CM | POA: Diagnosis not present

## 2015-11-14 DIAGNOSIS — Q796 Ehlers-Danlos syndrome: Secondary | ICD-10-CM | POA: Diagnosis not present

## 2015-11-19 DIAGNOSIS — S61209A Unspecified open wound of unspecified finger without damage to nail, initial encounter: Secondary | ICD-10-CM | POA: Diagnosis not present

## 2015-11-26 DIAGNOSIS — R739 Hyperglycemia, unspecified: Secondary | ICD-10-CM | POA: Diagnosis not present

## 2015-11-26 DIAGNOSIS — E039 Hypothyroidism, unspecified: Secondary | ICD-10-CM | POA: Diagnosis not present

## 2015-12-06 DIAGNOSIS — Z85828 Personal history of other malignant neoplasm of skin: Secondary | ICD-10-CM | POA: Diagnosis not present

## 2015-12-06 DIAGNOSIS — L570 Actinic keratosis: Secondary | ICD-10-CM | POA: Diagnosis not present

## 2015-12-06 DIAGNOSIS — L821 Other seborrheic keratosis: Secondary | ICD-10-CM | POA: Diagnosis not present

## 2015-12-06 DIAGNOSIS — L812 Freckles: Secondary | ICD-10-CM | POA: Diagnosis not present

## 2015-12-06 DIAGNOSIS — D1801 Hemangioma of skin and subcutaneous tissue: Secondary | ICD-10-CM | POA: Diagnosis not present

## 2015-12-07 DIAGNOSIS — R51 Headache: Secondary | ICD-10-CM | POA: Diagnosis not present

## 2015-12-07 DIAGNOSIS — Z0000 Encounter for general adult medical examination without abnormal findings: Secondary | ICD-10-CM | POA: Diagnosis not present

## 2015-12-07 DIAGNOSIS — F419 Anxiety disorder, unspecified: Secondary | ICD-10-CM | POA: Diagnosis not present

## 2015-12-07 DIAGNOSIS — E039 Hypothyroidism, unspecified: Secondary | ICD-10-CM | POA: Diagnosis not present

## 2016-01-09 DIAGNOSIS — M961 Postlaminectomy syndrome, not elsewhere classified: Secondary | ICD-10-CM | POA: Diagnosis not present

## 2016-01-09 DIAGNOSIS — R51 Headache: Secondary | ICD-10-CM | POA: Diagnosis not present

## 2016-01-09 DIAGNOSIS — G894 Chronic pain syndrome: Secondary | ICD-10-CM | POA: Diagnosis not present

## 2016-01-09 DIAGNOSIS — Z79891 Long term (current) use of opiate analgesic: Secondary | ICD-10-CM | POA: Diagnosis not present

## 2016-01-22 ENCOUNTER — Other Ambulatory Visit: Payer: Self-pay | Admitting: Physical Medicine and Rehabilitation

## 2016-01-23 ENCOUNTER — Other Ambulatory Visit: Payer: Self-pay | Admitting: Physical Medicine and Rehabilitation

## 2016-01-23 DIAGNOSIS — R519 Headache, unspecified: Secondary | ICD-10-CM

## 2016-01-23 DIAGNOSIS — R51 Headache: Principal | ICD-10-CM

## 2016-02-03 ENCOUNTER — Other Ambulatory Visit: Payer: PPO

## 2016-02-10 ENCOUNTER — Ambulatory Visit
Admission: RE | Admit: 2016-02-10 | Discharge: 2016-02-10 | Disposition: A | Discharge status: Home / Self Care | Payer: PPO | Source: Ambulatory Visit | Attending: Physical Medicine and Rehabilitation | Admitting: Physical Medicine and Rehabilitation

## 2016-02-10 DIAGNOSIS — R51 Headache: Secondary | ICD-10-CM | POA: Diagnosis not present

## 2016-02-10 DIAGNOSIS — R519 Headache, unspecified: Secondary | ICD-10-CM

## 2016-02-10 MED ORDER — GADOBENATE DIMEGLUMINE 529 MG/ML IV SOLN
19.0000 mL | Freq: Once | INTRAVENOUS | Status: AC | PRN
Start: 2016-02-10 — End: 2016-02-10
  Administered 2016-02-10: 19 mL via INTRAVENOUS

## 2016-02-25 DIAGNOSIS — I679 Cerebrovascular disease, unspecified: Secondary | ICD-10-CM | POA: Diagnosis not present

## 2016-02-25 DIAGNOSIS — Z6832 Body mass index (BMI) 32.0-32.9, adult: Secondary | ICD-10-CM | POA: Diagnosis not present

## 2016-02-25 DIAGNOSIS — I871 Compression of vein: Secondary | ICD-10-CM | POA: Diagnosis not present

## 2016-02-25 DIAGNOSIS — G932 Benign intracranial hypertension: Secondary | ICD-10-CM | POA: Diagnosis not present

## 2016-03-14 DIAGNOSIS — R51 Headache: Secondary | ICD-10-CM | POA: Diagnosis not present

## 2016-03-14 DIAGNOSIS — Z79891 Long term (current) use of opiate analgesic: Secondary | ICD-10-CM | POA: Diagnosis not present

## 2016-03-14 DIAGNOSIS — M961 Postlaminectomy syndrome, not elsewhere classified: Secondary | ICD-10-CM | POA: Diagnosis not present

## 2016-03-14 DIAGNOSIS — G894 Chronic pain syndrome: Secondary | ICD-10-CM | POA: Diagnosis not present

## 2016-03-18 DIAGNOSIS — M9901 Segmental and somatic dysfunction of cervical region: Secondary | ICD-10-CM | POA: Diagnosis not present

## 2016-03-18 DIAGNOSIS — S134XXA Sprain of ligaments of cervical spine, initial encounter: Secondary | ICD-10-CM | POA: Diagnosis not present

## 2016-03-19 DIAGNOSIS — J069 Acute upper respiratory infection, unspecified: Secondary | ICD-10-CM | POA: Diagnosis not present

## 2016-03-19 DIAGNOSIS — R509 Fever, unspecified: Secondary | ICD-10-CM | POA: Diagnosis not present

## 2016-03-20 DIAGNOSIS — S134XXA Sprain of ligaments of cervical spine, initial encounter: Secondary | ICD-10-CM | POA: Diagnosis not present

## 2016-03-20 DIAGNOSIS — M9901 Segmental and somatic dysfunction of cervical region: Secondary | ICD-10-CM | POA: Diagnosis not present

## 2016-05-01 DIAGNOSIS — H60392 Other infective otitis externa, left ear: Secondary | ICD-10-CM | POA: Diagnosis not present

## 2016-05-02 DIAGNOSIS — E039 Hypothyroidism, unspecified: Secondary | ICD-10-CM | POA: Diagnosis not present

## 2016-05-02 DIAGNOSIS — I871 Compression of vein: Secondary | ICD-10-CM | POA: Diagnosis not present

## 2016-05-02 DIAGNOSIS — G932 Benign intracranial hypertension: Secondary | ICD-10-CM | POA: Diagnosis not present

## 2016-05-02 DIAGNOSIS — I669 Occlusion and stenosis of unspecified cerebral artery: Secondary | ICD-10-CM | POA: Diagnosis not present

## 2016-05-02 DIAGNOSIS — F40232 Fear of other medical care: Secondary | ICD-10-CM | POA: Diagnosis not present

## 2016-05-08 ENCOUNTER — Ambulatory Visit (INDEPENDENT_AMBULATORY_CARE_PROVIDER_SITE_OTHER): Payer: PPO | Admitting: Gynecology

## 2016-05-08 ENCOUNTER — Encounter: Payer: Self-pay | Admitting: Gynecology

## 2016-05-08 VITALS — BP 120/76 | Ht 67.5 in | Wt 208.0 lb

## 2016-05-08 DIAGNOSIS — Z1151 Encounter for screening for human papillomavirus (HPV): Secondary | ICD-10-CM

## 2016-05-08 DIAGNOSIS — Z01419 Encounter for gynecological examination (general) (routine) without abnormal findings: Secondary | ICD-10-CM

## 2016-05-08 DIAGNOSIS — Z124 Encounter for screening for malignant neoplasm of cervix: Secondary | ICD-10-CM

## 2016-05-08 DIAGNOSIS — N926 Irregular menstruation, unspecified: Secondary | ICD-10-CM

## 2016-05-08 NOTE — Addendum Note (Signed)
Addended by: Nelva Nay on: 05/08/2016 11:28 AM   Modules accepted: Orders

## 2016-05-08 NOTE — Progress Notes (Signed)
    April Meyer July 03, 1965 [id number]        51 y.o.  G2P1011 for annual exam.  Has not been in for over 3 years. Has skipped her last menses in February. Was having regular monthly light menses previously. Is status post HerOption endometrial ablation in the past. Not sexually active. No hot flushes, night sweats.  Past medical history,surgical history, problem list, medications, allergies, family history and social history were all reviewed and documented as reviewed in the EPIC chart.  ROS:  Performed with pertinent positives and negatives included in the history, assessment and plan.   Additional significant findings :  None   Exam: [name] assistant Vitals:   05/08/16 1049  BP: 120/76  Weight: 208 lb (94.3 kg)  Height: 5' 7.5" (1.715 m)   Body mass index is 32.1 kg/m.  General appearance:  Normal affect, orientation and appearance. Skin: Grossly normal HEENT: Without gross lesions.  No cervical or supraclavicular adenopathy. Thyroid normal.  Lungs:  Clear without wheezing, rales or rhonchi Cardiac: RR, without RMG Abdominal:  Soft, nontender, without masses, guarding, rebound, organomegaly or hernia Breasts:  Examined lying and sitting without masses, retractions, discharge or axillary adenopathy. Pelvic:  Ext, BUS, Vagina: Normal  Cervix: Normal. Pap smear/HPV done  Uterus: Anteverted, normal size, shape and contour, midline and mobile nontender   Adnexa: Without masses or tenderness    Anus and perineum: Normal   Rectovaginal: Normal sphincter tone without palpated masses or tenderness.    Assessment/Plan:  51 y.o. G30P1011 female for annual exam with regular menses noting skipped last menses, abstinent birth control.   1. Skipped last menses. No other associated symptoms to suggest menopause. Will keep menstrual calendar and if irregularity continues or develops symptoms such as hot flushes or night sweats will call and we'll pursue blood work evaluation. If resumes  regular menses them will follow. 2. Contraception not an issue. 3. Mammography due now she has scheduled. SBE monthly reviewed. 4. Pap smear/HPV 2013. Pap smear/HPV today. No history of significant abnormal Pap smears previously. 5. Health maintenance. No routine lab work done as patient reports this done elsewhere. Follow up 1 year, sooner as needed.   Anastasio Auerbach MD, 11:09 AM 05/08/2016

## 2016-05-08 NOTE — Patient Instructions (Signed)
Call if skipped menses continues. Call if menopausal symptoms like hot flushes or night sweats develop.  You may obtain a copy of any labs that were done today by logging onto MyChart as outlined in the instructions provided with your AVS (after visit summary). The office will not call with normal lab results but certainly if there are any significant abnormalities then we will contact you.   Health Maintenance Adopting a healthy lifestyle and getting preventive care can go a long way to promote health and wellness. Talk with your health care provider about what schedule of regular examinations is right for you. This is a good chance for you to check in with your provider about disease prevention and staying healthy. In between checkups, there are plenty of things you can do on your own. Experts have done a lot of research about which lifestyle changes and preventive measures are most likely to keep you healthy. Ask your health care provider for more information. WEIGHT AND DIET  Eat a healthy diet  Be sure to include plenty of vegetables, fruits, low-fat dairy products, and lean protein.  Do not eat a lot of foods high in solid fats, added sugars, or salt.  Get regular exercise. This is one of the most important things you can do for your health.  Most adults should exercise for at least 150 minutes each week. The exercise should increase your heart rate and make you sweat (moderate-intensity exercise).  Most adults should also do strengthening exercises at least twice a week. This is in addition to the moderate-intensity exercise.  Maintain a healthy weight  Body mass index (BMI) is a measurement that can be used to identify possible weight problems. It estimates body fat based on height and weight. Your health care provider can help determine your BMI and help you achieve or maintain a healthy weight.  For females 24 years of age and older:   A BMI below 18.5 is considered  underweight.  A BMI of 18.5 to 24.9 is normal.  A BMI of 25 to 29.9 is considered overweight.  A BMI of 30 and above is considered obese.  Watch levels of cholesterol and blood lipids  You should start having your blood tested for lipids and cholesterol at 50 years of age, then have this test every 5 years.  You may need to have your cholesterol levels checked more often if:  Your lipid or cholesterol levels are high.  You are older than 50 years of age.  You are at high risk for heart disease.  CANCER SCREENING   Lung Cancer  Lung cancer screening is recommended for adults 29-51 years old who are at high risk for lung cancer because of a history of smoking.  A yearly low-dose CT scan of the lungs is recommended for people who:  Currently smoke.  Have quit within the past 15 years.  Have at least a 30-pack-year history of smoking. A pack year is smoking an average of one pack of cigarettes a day for 1 year.  Yearly screening should continue until it has been 15 years since you quit.  Yearly screening should stop if you develop a health problem that would prevent you from having lung cancer treatment.  Breast Cancer  Practice breast self-awareness. This means understanding how your breasts normally appear and feel.  It also means doing regular breast self-exams. Let your health care provider know about any changes, no matter how small.  If you are in your 104s  or 13s, you should have a clinical breast exam (CBE) by a health care provider every 1-3 years as part of a regular health exam.  If you are 16 or older, have a CBE every year. Also consider having a breast X-ray (mammogram) every year.  If you have a family history of breast cancer, talk to your health care provider about genetic screening.  If you are at high risk for breast cancer, talk to your health care provider about having an MRI and a mammogram every year.  Breast cancer gene (BRCA) assessment is  recommended for women who have family members with BRCA-related cancers. BRCA-related cancers include:  Breast.  Ovarian.  Tubal.  Peritoneal cancers.  Results of the assessment will determine the need for genetic counseling and BRCA1 and BRCA2 testing. Cervical Cancer Routine pelvic examinations to screen for cervical cancer are no longer recommended for nonpregnant women who are considered low risk for cancer of the pelvic organs (ovaries, uterus, and vagina) and who do not have symptoms. A pelvic examination may be necessary if you have symptoms including those associated with pelvic infections. Ask your health care provider if a screening pelvic exam is right for you.   The Pap test is the screening test for cervical cancer for women who are considered at risk.  If you had a hysterectomy for a problem that was not cancer or a condition that could lead to cancer, then you no longer need Pap tests.  If you are older than 65 years, and you have had normal Pap tests for the past 10 years, you no longer need to have Pap tests.  If you have had past treatment for cervical cancer or a condition that could lead to cancer, you need Pap tests and screening for cancer for at least 20 years after your treatment.  If you no longer get a Pap test, assess your risk factors if they change (such as having a new sexual partner). This can affect whether you should start being screened again.  Some women have medical problems that increase their chance of getting cervical cancer. If this is the case for you, your health care provider may recommend more frequent screening and Pap tests.  The human papillomavirus (HPV) test is another test that may be used for cervical cancer screening. The HPV test looks for the virus that can cause cell changes in the cervix. The cells collected during the Pap test can be tested for HPV.  The HPV test can be used to screen women 51 years of age and older. Getting tested  for HPV can extend the interval between normal Pap tests from three to five years.  An HPV test also should be used to screen women of any age who have unclear Pap test results.  After 50 years of age, women should have HPV testing as often as Pap tests.  Colorectal Cancer  This type of cancer can be detected and often prevented.  Routine colorectal cancer screening usually begins at 50 years of age and continues through 50 years of age.  Your health care provider may recommend screening at an earlier age if you have risk factors for colon cancer.  Your health care provider may also recommend using home test kits to check for hidden blood in the stool.  A small camera at the end of a tube can be used to examine your colon directly (sigmoidoscopy or colonoscopy). This is done to check for the earliest forms of colorectal cancer.  Routine screening usually begins at age 100.  Direct examination of the colon should be repeated every 5-10 years through 51 years of age. However, you may need to be screened more often if early forms of precancerous polyps or small growths are found. Skin Cancer  Check your skin from head to toe regularly.  Tell your health care provider about any new moles or changes in moles, especially if there is a change in a mole's shape or color.  Also tell your health care provider if you have a mole that is larger than the size of a pencil eraser.  Always use sunscreen. Apply sunscreen liberally and repeatedly throughout the day.  Protect yourself by wearing long sleeves, pants, a wide-brimmed hat, and sunglasses whenever you are outside. HEART DISEASE, DIABETES, AND HIGH BLOOD PRESSURE   Have your blood pressure checked at least every 1-2 years. High blood pressure causes heart disease and increases the risk of stroke.  If you are between 61 years and 46 years old, ask your health care provider if you should take aspirin to prevent strokes.  Have regular  diabetes screenings. This involves taking a blood sample to check your fasting blood sugar level.  If you are at a normal weight and have a low risk for diabetes, have this test once every three years after 50 years of age.  If you are overweight and have a high risk for diabetes, consider being tested at a younger age or more often. PREVENTING INFECTION  Hepatitis B  If you have a higher risk for hepatitis B, you should be screened for this virus. You are considered at high risk for hepatitis B if:  You were born in a country where hepatitis B is common. Ask your health care provider which countries are considered high risk.  Your parents were born in a high-risk country, and you have not been immunized against hepatitis B (hepatitis B vaccine).  You have HIV or AIDS.  You use needles to inject street drugs.  You live with someone who has hepatitis B.  You have had sex with someone who has hepatitis B.  You get hemodialysis treatment.  You take certain medicines for conditions, including cancer, organ transplantation, and autoimmune conditions. Hepatitis C  Blood testing is recommended for:  Everyone born from [birth year] through 1964.  Anyone with known risk factors for hepatitis C. Sexually transmitted infections (STIs)  You should be screened for sexually transmitted infections (STIs) including gonorrhea and chlamydia if:  You are sexually active and are younger than 50 years of age.  You are older than 50 years of age and your health care provider tells you that you are at risk for this type of infection.  Your sexual activity has changed since you were last screened and you are at an increased risk for chlamydia or gonorrhea. Ask your health care provider if you are at risk.  If you do not have HIV, but are at risk, it may be recommended that you take a prescription medicine daily to prevent HIV infection. This is called pre-exposure prophylaxis (PrEP). You are considered at  risk if:  You are sexually active and do not regularly use condoms or know the HIV status of your partner(s).  You take drugs by injection.  You are sexually active with a partner who has HIV. Talk with your health care provider about whether you are at high risk of being infected with HIV. If you choose to begin PrEP, you should first  be tested for HIV. You should then be tested every 3 months for as long as you are taking PrEP.  PREGNANCY   If you are premenopausal and you may become pregnant, ask your health care provider about preconception counseling.  If you may become pregnant, take 400 to 800 micrograms (mcg) of folic acid every day.  If you want to prevent pregnancy, talk to your health care provider about birth control (contraception). OSTEOPOROSIS AND MENOPAUSE   Osteoporosis is a disease in which the bones lose minerals and strength with aging. This can result in serious bone fractures. Your risk for osteoporosis can be identified using a bone density scan.  If you are 17 years of age or older, or if you are at risk for osteoporosis and fractures, ask your health care provider if you should be screened.  Ask your health care provider whether you should take a calcium or vitamin D supplement to lower your risk for osteoporosis.  Menopause may have certain physical symptoms and risks.  Hormone replacement therapy may reduce some of these symptoms and risks. Talk to your health care provider about whether hormone replacement therapy is right for you.  HOME CARE INSTRUCTIONS   Schedule regular health, dental, and eye exams.  Stay current with your immunizations.   Do not use any tobacco products including cigarettes, chewing tobacco, or electronic cigarettes.  If you are pregnant, do not drink alcohol.  If you are breastfeeding, limit how much and how often you drink alcohol.  Limit alcohol intake to no more than 1 drink per day for nonpregnant women. One drink equals  12 ounces of beer, 5 ounces of wine, or 1 ounces of hard liquor.  Do not use street drugs.  Do not share needles.  Ask your health care provider for help if you need support or information about quitting drugs.  Tell your health care provider if you often feel depressed.  Tell your health care provider if you have ever been abused or do not feel safe at home. Document Released: 09/09/2010 Document Revised: 07/11/2013 Document Reviewed: 01/26/2013 [facility] Patient Information 2015 [place], [place]. This information is not intended to replace advice given to you by your health care provider. Make sure you discuss any questions you have with your health care provider.

## 2016-05-09 DIAGNOSIS — M961 Postlaminectomy syndrome, not elsewhere classified: Secondary | ICD-10-CM | POA: Diagnosis not present

## 2016-05-09 DIAGNOSIS — Z79891 Long term (current) use of opiate analgesic: Secondary | ICD-10-CM | POA: Diagnosis not present

## 2016-05-09 DIAGNOSIS — G894 Chronic pain syndrome: Secondary | ICD-10-CM | POA: Diagnosis not present

## 2016-05-09 DIAGNOSIS — R51 Headache: Secondary | ICD-10-CM | POA: Diagnosis not present

## 2016-05-13 LAB — PAP IG AND HPV HIGH-RISK: HPV DNA High Risk: NOT DETECTED

## 2016-06-05 DIAGNOSIS — Z13220 Encounter for screening for lipoid disorders: Secondary | ICD-10-CM | POA: Diagnosis not present

## 2016-06-05 DIAGNOSIS — Z0000 Encounter for general adult medical examination without abnormal findings: Secondary | ICD-10-CM | POA: Diagnosis not present

## 2016-06-05 DIAGNOSIS — E039 Hypothyroidism, unspecified: Secondary | ICD-10-CM | POA: Diagnosis not present

## 2016-06-16 DIAGNOSIS — J45909 Unspecified asthma, uncomplicated: Secondary | ICD-10-CM | POA: Diagnosis not present

## 2016-06-16 DIAGNOSIS — Z0000 Encounter for general adult medical examination without abnormal findings: Secondary | ICD-10-CM | POA: Diagnosis not present

## 2016-06-16 DIAGNOSIS — F419 Anxiety disorder, unspecified: Secondary | ICD-10-CM | POA: Diagnosis not present

## 2016-06-16 DIAGNOSIS — E039 Hypothyroidism, unspecified: Secondary | ICD-10-CM | POA: Diagnosis not present

## 2016-06-23 ENCOUNTER — Telehealth: Payer: Self-pay

## 2016-06-23 MED ORDER — VALACYCLOVIR HCL 1 G PO TABS: ORAL_TABLET | ORAL | 3 refills | Status: DC

## 2016-06-23 NOTE — Telephone Encounter (Signed)
Rx sent. Patient informed. 

## 2016-06-23 NOTE — Telephone Encounter (Signed)
Patient said she has old Rx "from years ago" for generic Valtrex that has run out. She has cold sore on her mouth today and would like to get Rx so that she can start it asap if possible.   Rx?

## 2016-06-23 NOTE — Telephone Encounter (Signed)
Cold sore recommendations Valtrex 1 g #4 tablets.  Take 2 tablets (2 grams) by mouth now and repeat in 12 hours refill 3

## 2016-06-24 DIAGNOSIS — M9901 Segmental and somatic dysfunction of cervical region: Secondary | ICD-10-CM | POA: Diagnosis not present

## 2016-06-24 DIAGNOSIS — S134XXA Sprain of ligaments of cervical spine, initial encounter: Secondary | ICD-10-CM | POA: Diagnosis not present

## 2016-07-03 DIAGNOSIS — S134XXA Sprain of ligaments of cervical spine, initial encounter: Secondary | ICD-10-CM | POA: Diagnosis not present

## 2016-07-03 DIAGNOSIS — M9901 Segmental and somatic dysfunction of cervical region: Secondary | ICD-10-CM | POA: Diagnosis not present

## 2016-07-04 DIAGNOSIS — R51 Headache: Secondary | ICD-10-CM | POA: Diagnosis not present

## 2016-07-04 DIAGNOSIS — M961 Postlaminectomy syndrome, not elsewhere classified: Secondary | ICD-10-CM | POA: Diagnosis not present

## 2016-07-04 DIAGNOSIS — G894 Chronic pain syndrome: Secondary | ICD-10-CM | POA: Diagnosis not present

## 2016-07-04 DIAGNOSIS — Z79891 Long term (current) use of opiate analgesic: Secondary | ICD-10-CM | POA: Diagnosis not present

## 2016-07-09 DIAGNOSIS — K219 Gastro-esophageal reflux disease without esophagitis: Secondary | ICD-10-CM | POA: Diagnosis not present

## 2016-07-09 DIAGNOSIS — K449 Diaphragmatic hernia without obstruction or gangrene: Secondary | ICD-10-CM | POA: Diagnosis not present

## 2016-07-09 DIAGNOSIS — Z1211 Encounter for screening for malignant neoplasm of colon: Secondary | ICD-10-CM | POA: Diagnosis not present

## 2016-07-10 DIAGNOSIS — Q796 Ehlers-Danlos syndrome: Secondary | ICD-10-CM | POA: Diagnosis not present

## 2016-07-10 DIAGNOSIS — E6609 Other obesity due to excess calories: Secondary | ICD-10-CM | POA: Diagnosis not present

## 2016-07-10 DIAGNOSIS — Z6832 Body mass index (BMI) 32.0-32.9, adult: Secondary | ICD-10-CM | POA: Diagnosis not present

## 2016-07-18 ENCOUNTER — Other Ambulatory Visit: Payer: Self-pay | Admitting: Gynecology

## 2016-07-18 DIAGNOSIS — Z1231 Encounter for screening mammogram for malignant neoplasm of breast: Secondary | ICD-10-CM

## 2016-07-22 ENCOUNTER — Ambulatory Visit (INDEPENDENT_AMBULATORY_CARE_PROVIDER_SITE_OTHER): Payer: PPO | Admitting: Neurology

## 2016-07-22 ENCOUNTER — Encounter: Payer: Self-pay | Admitting: Neurology

## 2016-07-22 VITALS — BP 123/74 | HR 57 | Ht 67.0 in | Wt 216.0 lb

## 2016-07-22 DIAGNOSIS — G8928 Other chronic postprocedural pain: Secondary | ICD-10-CM

## 2016-07-22 NOTE — Progress Notes (Signed)
SLEEP MEDICINE CLINIC   Provider:  Larey Seat, M D  Primary Care Physician:  Jani Gravel, MD   Referring Provider: Jani Gravel, MD   Chief Complaint  Patient presents with  . New Patient (Initial Visit)    headaches and visual disturbances    HPI:  April Meyer is a 51 y.o. female , seen here as in a referral  from Dr. Maudie Mercury for headaches and possible "shunt " procedure.  April Meyer had seen me in February 2015 for a sleep study, at the time referred by Dr. Maudie Mercury with a history of Arnold-Chiari deformity, herpes simplex virus infection, hypothyroidism, even as Danlos syndrome, migraines, anxiety, ADHD possible vitamin D deficiency and chronic pain and headaches. She underwent a sleep study in 2010 at [facility] which diagnosed her with obstructive sleep apnea. The patient reportedly was excessively daytime sleepy endorsing the Epworth score at 16 points but also endorsed a very high rate of depression at the time the pH Q9 was endorsed at 19 points. Dr. Maudie Mercury ordered a repeat sleep study to see if sleep apnea was still present and this was performed on 04/29/2013. The sleep study revealed 0.0 apneas and nadir of oxygen desaturation was 92% there were few PLMS and almost no of arousals related to PLMS noted, heart rate was in normal sinus rhythm no tachycardia no bradycardia and she did not retain CO2. Follow-up was optional at the time. She continued to have frequent headaches, treated with opioids.   She reported last year left eye vision changes - described as  Flashing light. with pulsation and throbbing quality, left eye vision changes for 15- 30 minutes.  Dr Maudie Mercury begun to investigate.  In the meantime Mrs. Polcyn has seen a neurosurgeon in [place] ( dec 2017- jan 2018 ) , underwent a cerebral angiography which supposedly returned abnormal. Dr. Maudie Mercury stated that this may be related to her headaches and that neurology will take it from here. I would like to add that the patient is currently  taking albuterol, Nasonex, Claritin, Prilosec, promethazine, Valium 5 Flexeril 10, Xanax 0.5, Synthroid, Percocet 10/325. She status post Arnold-Chiari malformation surgery performed in [place] Dr. Jeri Lager was her surgeon.  Chief complaint according to patient : " Dr Maudie Mercury send me "   Sleep habits are as follows: goes to bed the same time very night. Hip pops and shoulder pops wake her up- both on the left side. She sleeps from 10 Pm and stays until 6 AM,  Not often needing a bathroom break.    Social history:  Takes water exercises for balance, tired, sleepiness, headaches.   Review of Systems: Out of a complete 14 system review, the patient complains of only the following symptoms, and all other reviewed systems are negative.  Epworth score 13-15 , Fatigue severity score 60  , depression score 7/15    Social History   Social History  . Marital status: Divorced    Spouse name: N/A  . Number of children: 1  . Years of education: 48   Occupational History  .  Other   Social History Main Topics  . Smoking status: Former Smoker    Quit date: 09/09/2007  . Smokeless tobacco: Never Used  . Alcohol use No  . Drug use: No  . Sexual activity: Not Currently    Partners: Male     Comment: 1st intercourse 51 yo-More than 5 partners   Other Topics Concern  . Not on file  Social History Narrative   Patient is divorced and lives at home with her one child.   Patient is disabled.   Patient is right-handed.   Patient has a college education.   Patient drinks one cup of coffee daily.    Family History  Problem Relation Age of Onset  . Diabetes Maternal Aunt   . Breast cancer Maternal Aunt        40's  . Diabetes Maternal Uncle   . Breast cancer Paternal Aunt 45  . Diabetes Maternal Grandmother   . Cancer Maternal Grandmother        SKIN AND LUNG  . Diabetes Paternal Grandmother   . Breast cancer Maternal Aunt        50's  . Breast cancer Maternal Aunt        50's     Past Medical History:  Diagnosis Date  . ADHD (attention deficit hyperactivity disorder)   . Anxiety   . Arnold-Chiari deformity ([place])   . Brain tumor ([place])   . Cerebral venous sinus thrombosis, remote, resolved 03/28/2013  . Chronic pain   . EDS (Ehlers-Danlos syndrome)   . Eustachian tube dysfunction   . GERD (gastroesophageal reflux disease)   . Hemorrhoids   . HSV infection   . Hypothyroidism   . Migraine   . Nystagmus, positional, central type 03/28/2013  . Obstructive sleep apnea   . Other malaise and fatigue 03/28/2013   " The patient reports feeling happy, ED, sore" on she has undergone at Chiari malformation surgery decompression in 2011. Prior to the surgery were chief complaints were weakness numbness and neck problems. She had swallowing difficulties and dizziness;  Classic Chiari presentation. But she didn't have headaches.  . Vitamin D deficiency     Past Surgical History:  Procedure Laterality Date  . ARNOLD CHIARI REPAIR    . BRAIN SURGERY    . CHOLECYSTECTOMY    . DILATION AND CURETTAGE OF UTERUS    . ENDOMETRIAL ABLATION  02/09/2013   HerOption  . INTRAUTERINE DEVICE INSERTION  10/2010   Mirena  . IUD REMOVAL  11/2010   could not tolerate hormonal side effects  . neck fusion c1-c3    . TONSILLECTOMY AND ADENOIDECTOMY      Current Outpatient Prescriptions  Medication Sig Dispense Refill  . ALPRAZolam (XANAX) 0.5 MG tablet Take 0.5 mg by mouth 3 (three) times daily as needed. Anxiety    . diazepam (VALIUM) 5 MG tablet Take 1 tablet (5 mg total) by mouth every 6 (six) hours as needed for anxiety. 15 tablet 0  . levothyroxine (SYNTHROID, LEVOTHROID) 125 MCG tablet Take 125 mcg by mouth daily before breakfast.    . Omeprazole Magnesium (PRILOSEC OTC PO) Take 1 capsule by mouth at bedtime.     . Oxycodone HCl 10 MG TABS Take 10 mg by mouth every 4 (four) hours as needed (pain.).     Marland Kitchen promethazine (PHENERGAN) 25 MG tablet Take 25 mg by mouth every 6 (six)  hours as needed. Nausea    . valACYclovir (VALTREX) 1000 MG tablet Take 2 tabs (2 grams) po now and repeat in 12 hours prn coldsore. 4 tablet 3   No current facility-administered medications for this visit.     Allergies as of 07/22/2016 - Review Complete 07/22/2016  Allergen Reaction Noted  . Other Anaphylaxis 03/25/2013  . Prednisone Anaphylaxis     Vitals: BP 123/74   Pulse (!) 57   Ht 5\' 7"  (1.702 m)  Wt 216 lb (98 kg)   BMI 33.83 kg/m  Last Weight:  Wt Readings from Last 1 Encounters:  07/22/16 216 lb (98 kg)   VEL:FYBO mass index is 33.83 kg/m.     Last Height:   Ht Readings from Last 1 Encounters:  07/22/16 5\' 7"  (1.702 m)    Physical exam:  General: The patient is awake, alert and appears not in acute distress. The patient is well groomed. Head: Normocephalic, atraumatic. Neck is supple. Mallampati 3  neck circumference: 15. Nasal airflow patent ,  Cardiovascular:  Regular rate and rhythm , without  murmurs or carotid bruit, and without distended neck veins. Respiratory: Lungs are clear to auscultation. Skin:  Without evidence of edema, or rash Trunk: BMI is 34 . The patient's posture is " stiff"  - stiff posture in the neck. Chronic pain .  Neurologic exam : The patient is awake and alert, oriented to place and time.   Cranial nerves: Pupils are equal and briskly reactive to light. Extraocular movements  in vertical and horizontal planes intact and without nystagmus. Visual fields by finger perimetry are intact. Hearing to finger rub intact.   Facial sensation intact to fine touch.  Facial motor strength is symmetric and tongue and uvula move midline. Shoulder shrug was symmetrical.   Motor exam: Normal tone, muscle bulk and symmetric strength in all extremities. Sensory:  Fine touch, pinprick and vibration. Proprioception tested in the upper extremities was normal. Coordination: Finger-to-nose maneuver  normal without evidence of ataxia, dysmetria or  tremor. Gait and station: Patient walks without assistive device and is able unassisted to climb up to the exam table. Strength within normal limits.  Stance is stable and normal.  Deep tendon reflexes: in the  upper and lower extremities are symmetric and intact. Babinski maneuver response is downgoing.   The patient was referred for an evaluation or possible "shunt procedure ", but had been asked to see Dr. Margot Chimes [facility], or dr. Veleta Miners at [facility]  neurosurgery. When she called there , no referral hat been sedn, no materials for review. I did not have anangiogram report here, either.  The patient was seen in the sleep clinic.  If Dr. Maudie Mercury feels she needs to be seen by a neurosurgeon, she needs to have a referral there and the necessary material for review..  I do not practice any pain management.    Assessment:  After physical and neurologic examination, review of laboratory studies,  Personal review of imaging studies, reports of other /same  Imaging studies, results of polysomnography and / or neurophysiology testing and pre-existing records as far as provided in visit., my assessment is   I cannot establish a diagnosis. I would recommend a referral to a headache specialist for any further neurological evaluation.    The patient was advised of the nature of the diagnosed disorder , the treatment options and the  risks for general health and wellness arising from not treating the condition.   I spent more than 52minutes of face to face time with the patient.  Greater than 50% of time was spent in counseling and coordination of care. We have discussed the diagnosis and differential and I answered the patient's questions.    Plan:  Treatment plan and additional workup :   Dr. Maudie Mercury, I will not repeat a sleep study on this patient with an AHI of 0.0 in 2015 . She feels she needs to see a neurosurgeon , please refer to tertiary care center.  No Follow-up on file.   Larey Seat, MD  5/59/7416, 3:84 PM  Certified in Neurology by ABPN Certified in [place] by [facility] [address] [place], [place] [address]

## 2016-08-10 DIAGNOSIS — J45901 Unspecified asthma with (acute) exacerbation: Secondary | ICD-10-CM | POA: Diagnosis not present

## 2016-08-10 DIAGNOSIS — R52 Pain, unspecified: Secondary | ICD-10-CM | POA: Diagnosis not present

## 2016-08-10 DIAGNOSIS — R0602 Shortness of breath: Secondary | ICD-10-CM | POA: Diagnosis not present

## 2016-08-10 DIAGNOSIS — J22 Unspecified acute lower respiratory infection: Secondary | ICD-10-CM | POA: Diagnosis not present

## 2016-08-21 ENCOUNTER — Ambulatory Visit: Payer: PPO

## 2016-08-26 DIAGNOSIS — Z86010 Personal history of colonic polyps: Secondary | ICD-10-CM | POA: Diagnosis not present

## 2016-08-28 ENCOUNTER — Ambulatory Visit: Payer: PPO

## 2016-08-29 DIAGNOSIS — G894 Chronic pain syndrome: Secondary | ICD-10-CM | POA: Diagnosis not present

## 2016-08-29 DIAGNOSIS — M961 Postlaminectomy syndrome, not elsewhere classified: Secondary | ICD-10-CM | POA: Diagnosis not present

## 2016-08-29 DIAGNOSIS — R51 Headache: Secondary | ICD-10-CM | POA: Diagnosis not present

## 2016-08-29 DIAGNOSIS — Z79891 Long term (current) use of opiate analgesic: Secondary | ICD-10-CM | POA: Diagnosis not present

## 2016-09-04 DIAGNOSIS — R197 Diarrhea, unspecified: Secondary | ICD-10-CM | POA: Diagnosis not present

## 2016-09-05 ENCOUNTER — Telehealth: Payer: Self-pay | Admitting: Internal Medicine

## 2016-09-05 DIAGNOSIS — R197 Diarrhea, unspecified: Secondary | ICD-10-CM | POA: Diagnosis not present

## 2016-09-05 NOTE — Telephone Encounter (Signed)
48 hrs diarrhea, now 5-6 bloody stools today Was seen at [facility] walk in yesterday and told to go to ED if more bloody stools No pain "I just do not feel well"  ED eval and Tx is reasonable

## 2016-10-30 DIAGNOSIS — M961 Postlaminectomy syndrome, not elsewhere classified: Secondary | ICD-10-CM | POA: Diagnosis not present

## 2016-10-30 DIAGNOSIS — G894 Chronic pain syndrome: Secondary | ICD-10-CM | POA: Diagnosis not present

## 2016-10-30 DIAGNOSIS — R51 Headache: Secondary | ICD-10-CM | POA: Diagnosis not present

## 2016-10-30 DIAGNOSIS — Z79891 Long term (current) use of opiate analgesic: Secondary | ICD-10-CM | POA: Diagnosis not present

## 2016-12-08 DIAGNOSIS — R51 Headache: Secondary | ICD-10-CM | POA: Diagnosis not present

## 2016-12-08 DIAGNOSIS — M961 Postlaminectomy syndrome, not elsewhere classified: Secondary | ICD-10-CM | POA: Diagnosis not present

## 2016-12-08 DIAGNOSIS — Z79891 Long term (current) use of opiate analgesic: Secondary | ICD-10-CM | POA: Diagnosis not present

## 2016-12-08 DIAGNOSIS — G894 Chronic pain syndrome: Secondary | ICD-10-CM | POA: Diagnosis not present

## 2016-12-11 DIAGNOSIS — D1801 Hemangioma of skin and subcutaneous tissue: Secondary | ICD-10-CM | POA: Diagnosis not present

## 2016-12-11 DIAGNOSIS — L812 Freckles: Secondary | ICD-10-CM | POA: Diagnosis not present

## 2016-12-11 DIAGNOSIS — D225 Melanocytic nevi of trunk: Secondary | ICD-10-CM | POA: Diagnosis not present

## 2016-12-11 DIAGNOSIS — D2272 Melanocytic nevi of left lower limb, including hip: Secondary | ICD-10-CM | POA: Diagnosis not present

## 2016-12-11 DIAGNOSIS — Z85828 Personal history of other malignant neoplasm of skin: Secondary | ICD-10-CM | POA: Diagnosis not present

## 2016-12-11 DIAGNOSIS — D2271 Melanocytic nevi of right lower limb, including hip: Secondary | ICD-10-CM | POA: Diagnosis not present

## 2016-12-11 DIAGNOSIS — L821 Other seborrheic keratosis: Secondary | ICD-10-CM | POA: Diagnosis not present

## 2017-01-22 DIAGNOSIS — M170 Bilateral primary osteoarthritis of knee: Secondary | ICD-10-CM | POA: Diagnosis not present

## 2017-01-22 DIAGNOSIS — M549 Dorsalgia, unspecified: Secondary | ICD-10-CM | POA: Diagnosis not present

## 2017-01-22 DIAGNOSIS — M25561 Pain in right knee: Secondary | ICD-10-CM | POA: Diagnosis not present

## 2017-01-22 DIAGNOSIS — M25519 Pain in unspecified shoulder: Secondary | ICD-10-CM | POA: Diagnosis not present

## 2017-01-22 DIAGNOSIS — M25512 Pain in left shoulder: Secondary | ICD-10-CM | POA: Diagnosis not present

## 2017-01-22 DIAGNOSIS — M1990 Unspecified osteoarthritis, unspecified site: Secondary | ICD-10-CM | POA: Diagnosis not present

## 2017-01-22 DIAGNOSIS — M25559 Pain in unspecified hip: Secondary | ICD-10-CM | POA: Diagnosis not present

## 2017-02-05 DIAGNOSIS — M961 Postlaminectomy syndrome, not elsewhere classified: Secondary | ICD-10-CM | POA: Diagnosis not present

## 2017-02-05 DIAGNOSIS — R51 Headache: Secondary | ICD-10-CM | POA: Diagnosis not present

## 2017-02-05 DIAGNOSIS — G894 Chronic pain syndrome: Secondary | ICD-10-CM | POA: Diagnosis not present

## 2017-02-05 DIAGNOSIS — Z79891 Long term (current) use of opiate analgesic: Secondary | ICD-10-CM | POA: Diagnosis not present

## 2017-02-10 DIAGNOSIS — R51 Headache: Secondary | ICD-10-CM | POA: Diagnosis not present

## 2017-02-10 DIAGNOSIS — G894 Chronic pain syndrome: Secondary | ICD-10-CM | POA: Diagnosis not present

## 2017-02-10 DIAGNOSIS — Z79891 Long term (current) use of opiate analgesic: Secondary | ICD-10-CM | POA: Diagnosis not present

## 2017-02-10 DIAGNOSIS — M961 Postlaminectomy syndrome, not elsewhere classified: Secondary | ICD-10-CM | POA: Diagnosis not present

## 2017-03-18 DIAGNOSIS — G894 Chronic pain syndrome: Secondary | ICD-10-CM | POA: Diagnosis not present

## 2017-03-18 DIAGNOSIS — M961 Postlaminectomy syndrome, not elsewhere classified: Secondary | ICD-10-CM | POA: Diagnosis not present

## 2017-03-18 DIAGNOSIS — Z79891 Long term (current) use of opiate analgesic: Secondary | ICD-10-CM | POA: Diagnosis not present

## 2017-03-18 DIAGNOSIS — J0140 Acute pansinusitis, unspecified: Secondary | ICD-10-CM | POA: Diagnosis not present

## 2017-03-18 DIAGNOSIS — R51 Headache: Secondary | ICD-10-CM | POA: Diagnosis not present

## 2017-03-18 DIAGNOSIS — M5481 Occipital neuralgia: Secondary | ICD-10-CM | POA: Diagnosis not present

## 2017-04-09 DIAGNOSIS — G894 Chronic pain syndrome: Secondary | ICD-10-CM | POA: Diagnosis not present

## 2017-04-09 DIAGNOSIS — M961 Postlaminectomy syndrome, not elsewhere classified: Secondary | ICD-10-CM | POA: Diagnosis not present

## 2017-04-09 DIAGNOSIS — Z79891 Long term (current) use of opiate analgesic: Secondary | ICD-10-CM | POA: Diagnosis not present

## 2017-04-09 DIAGNOSIS — R51 Headache: Secondary | ICD-10-CM | POA: Diagnosis not present

## 2017-05-07 DIAGNOSIS — M7552 Bursitis of left shoulder: Secondary | ICD-10-CM | POA: Diagnosis not present

## 2017-05-07 DIAGNOSIS — M1990 Unspecified osteoarthritis, unspecified site: Secondary | ICD-10-CM | POA: Diagnosis not present

## 2017-05-07 DIAGNOSIS — M549 Dorsalgia, unspecified: Secondary | ICD-10-CM | POA: Diagnosis not present

## 2017-05-07 DIAGNOSIS — M25519 Pain in unspecified shoulder: Secondary | ICD-10-CM | POA: Diagnosis not present

## 2017-05-07 DIAGNOSIS — M25559 Pain in unspecified hip: Secondary | ICD-10-CM | POA: Diagnosis not present

## 2017-05-07 DIAGNOSIS — Q796 Ehlers-Danlos syndrome: Secondary | ICD-10-CM | POA: Diagnosis not present

## 2017-05-11 ENCOUNTER — Encounter: Payer: PPO | Admitting: Gynecology

## 2017-05-20 DIAGNOSIS — M25512 Pain in left shoulder: Secondary | ICD-10-CM | POA: Diagnosis not present

## 2017-05-25 ENCOUNTER — Encounter: Payer: Self-pay | Admitting: Gynecology

## 2017-05-25 ENCOUNTER — Ambulatory Visit: Payer: PPO | Admitting: Gynecology

## 2017-05-25 VITALS — BP 120/78 | Ht 67.0 in | Wt 221.0 lb

## 2017-05-25 DIAGNOSIS — Z01419 Encounter for gynecological examination (general) (routine) without abnormal findings: Secondary | ICD-10-CM

## 2017-05-25 DIAGNOSIS — A6000 Herpesviral infection of urogenital system, unspecified: Secondary | ICD-10-CM | POA: Diagnosis not present

## 2017-05-25 MED ORDER — VALACYCLOVIR HCL 1 G PO TABS: ORAL_TABLET | ORAL | 4 refills | Status: DC

## 2017-05-25 NOTE — Patient Instructions (Signed)
Follow-up in 1 year for annual exam, sooner as needed. 

## 2017-05-25 NOTE — Progress Notes (Signed)
    April Meyer [birth year] [id number]        52 y.o.  G2P1011 for annual gynecologic exam.  Doing well.  No gynecologic complaints  Past medical history,surgical history, problem list, medications, allergies, family history and social history were all reviewed and documented as reviewed in the EPIC chart.  ROS:  Performed with pertinent positives and negatives included in the history, assessment and plan.   Additional significant findings : None   Exam: [name] assistant Vitals:   05/25/17 1207  BP: 120/78  Weight: 221 lb (100.2 kg)  Height: 5\' 7"  (1.702 m)   Body mass index is 34.61 kg/m.  General appearance:  Normal affect, orientation and appearance. Skin: Grossly normal HEENT: Without gross lesions.  No cervical or supraclavicular adenopathy. Thyroid normal.  Lungs:  Clear without wheezing, rales or rhonchi Cardiac: RR, without RMG Abdominal:  Soft, nontender, without masses, guarding, rebound, organomegaly or hernia Breasts:  Examined lying and sitting without masses, retractions, discharge or axillary adenopathy. Pelvic:  Ext, BUS, Vagina: Normal  Cervix: Normal  Uterus: Anteverted, normal size, shape and contour, midline and mobile nontender   Adnexa: Without masses or tenderness    Anus and perineum: Normal   Rectovaginal: Normal sphincter tone without palpated masses or tenderness.    Assessment/Plan:  52 y.o. G41P1011 female for annual gynecologic exam with regular light menses, abstinent contraception.   1. Regular light menses.  No menopausal symptoms. 2. Mammography overdue and patient is in the process of arranging.  Breast exam normal today. 3. Pap smear/HPV 05/2016.  No Pap smear done today.  No history of abnormal Pap smears.  Plan repeat Pap smear at 5-year interval per current screening guidelines. 4. Colonoscopy 2018.  Repeat at their recommended interval. 5. History of herpes genitalis and herpes labialis.  Uses Valtrex 2 g at earliest onset with  repeat in 12 hours which seems to work for both areas.  Has occasional outbreaks.  #20 with 3 refills provided. 6. Health maintenance.  No routine lab work done as patient does this elsewhere.  Follow-up 1 year, sooner as needed.   Anastasio Auerbach MD, 12:32 PM 05/25/2017

## 2017-05-26 DIAGNOSIS — M25512 Pain in left shoulder: Secondary | ICD-10-CM | POA: Diagnosis not present

## 2017-05-29 DIAGNOSIS — M25512 Pain in left shoulder: Secondary | ICD-10-CM | POA: Diagnosis not present

## 2017-06-01 DIAGNOSIS — M25512 Pain in left shoulder: Secondary | ICD-10-CM | POA: Diagnosis not present

## 2017-06-04 DIAGNOSIS — G894 Chronic pain syndrome: Secondary | ICD-10-CM | POA: Diagnosis not present

## 2017-06-04 DIAGNOSIS — R51 Headache: Secondary | ICD-10-CM | POA: Diagnosis not present

## 2017-06-04 DIAGNOSIS — M961 Postlaminectomy syndrome, not elsewhere classified: Secondary | ICD-10-CM | POA: Diagnosis not present

## 2017-06-04 DIAGNOSIS — Z79891 Long term (current) use of opiate analgesic: Secondary | ICD-10-CM | POA: Diagnosis not present

## 2017-06-05 DIAGNOSIS — M25512 Pain in left shoulder: Secondary | ICD-10-CM | POA: Diagnosis not present

## 2017-06-10 DIAGNOSIS — M25512 Pain in left shoulder: Secondary | ICD-10-CM | POA: Diagnosis not present

## 2017-06-19 DIAGNOSIS — G8220 Paraplegia, unspecified: Secondary | ICD-10-CM | POA: Diagnosis not present

## 2017-06-24 ENCOUNTER — Other Ambulatory Visit: Payer: Self-pay | Admitting: Neurosurgery

## 2017-06-24 DIAGNOSIS — G839 Paralytic syndrome, unspecified: Secondary | ICD-10-CM

## 2017-06-25 ENCOUNTER — Other Ambulatory Visit: Payer: PPO

## 2017-06-25 DIAGNOSIS — Z0000 Encounter for general adult medical examination without abnormal findings: Secondary | ICD-10-CM | POA: Diagnosis not present

## 2017-06-25 DIAGNOSIS — E785 Hyperlipidemia, unspecified: Secondary | ICD-10-CM | POA: Diagnosis not present

## 2017-06-25 DIAGNOSIS — E039 Hypothyroidism, unspecified: Secondary | ICD-10-CM | POA: Diagnosis not present

## 2017-06-26 ENCOUNTER — Other Ambulatory Visit: Payer: Self-pay | Admitting: Neurosurgery

## 2017-06-26 ENCOUNTER — Ambulatory Visit
Admission: RE | Admit: 2017-06-26 | Discharge: 2017-06-26 | Disposition: A | Discharge status: Home / Self Care | Payer: PPO | Source: Ambulatory Visit | Attending: Neurosurgery | Admitting: Neurosurgery

## 2017-06-26 ENCOUNTER — Other Ambulatory Visit: Payer: PPO

## 2017-06-26 DIAGNOSIS — G839 Paralytic syndrome, unspecified: Secondary | ICD-10-CM

## 2017-06-26 DIAGNOSIS — M542 Cervicalgia: Secondary | ICD-10-CM | POA: Diagnosis not present

## 2017-06-26 DIAGNOSIS — G939 Disorder of brain, unspecified: Secondary | ICD-10-CM | POA: Diagnosis not present

## 2017-06-26 MED ORDER — GADOBENATE DIMEGLUMINE 529 MG/ML IV SOLN
20.0000 mL | Freq: Once | INTRAVENOUS | Status: AC | PRN
  Administered 2017-06-26: 20 mL via INTRAVENOUS

## 2017-06-28 ENCOUNTER — Ambulatory Visit
Admission: RE | Admit: 2017-06-28 | Discharge: 2017-06-28 | Disposition: A | Discharge status: Home / Self Care | Payer: PPO | Source: Ambulatory Visit | Attending: Neurosurgery | Admitting: Neurosurgery

## 2017-06-28 ENCOUNTER — Other Ambulatory Visit: Payer: PPO

## 2017-06-28 DIAGNOSIS — R32 Unspecified urinary incontinence: Secondary | ICD-10-CM | POA: Diagnosis not present

## 2017-06-28 DIAGNOSIS — Z8669 Personal history of other diseases of the nervous system and sense organs: Secondary | ICD-10-CM | POA: Diagnosis not present

## 2017-06-28 DIAGNOSIS — R200 Anesthesia of skin: Secondary | ICD-10-CM | POA: Diagnosis not present

## 2017-06-28 DIAGNOSIS — Z8739 Personal history of other diseases of the musculoskeletal system and connective tissue: Secondary | ICD-10-CM | POA: Diagnosis not present

## 2017-06-28 DIAGNOSIS — G839 Paralytic syndrome, unspecified: Secondary | ICD-10-CM

## 2017-06-28 DIAGNOSIS — R531 Weakness: Secondary | ICD-10-CM | POA: Diagnosis not present

## 2017-06-28 DIAGNOSIS — R202 Paresthesia of skin: Secondary | ICD-10-CM | POA: Diagnosis not present

## 2017-06-28 DIAGNOSIS — M545 Low back pain: Secondary | ICD-10-CM | POA: Diagnosis not present

## 2017-06-29 MED ORDER — GADOBENATE DIMEGLUMINE 529 MG/ML IV SOLN
20.0000 mL | Freq: Once | INTRAVENOUS | Status: AC | PRN
  Administered 2017-06-29: 20 mL via INTRAVENOUS

## 2017-07-02 ENCOUNTER — Other Ambulatory Visit: Payer: PPO

## 2017-07-02 DIAGNOSIS — R200 Anesthesia of skin: Secondary | ICD-10-CM | POA: Diagnosis not present

## 2017-07-02 DIAGNOSIS — Z0000 Encounter for general adult medical examination without abnormal findings: Secondary | ICD-10-CM | POA: Diagnosis not present

## 2017-07-02 DIAGNOSIS — R202 Paresthesia of skin: Secondary | ICD-10-CM | POA: Diagnosis not present

## 2017-07-03 ENCOUNTER — Other Ambulatory Visit: Payer: PPO

## 2017-07-04 ENCOUNTER — Other Ambulatory Visit: Payer: PPO

## 2017-07-07 ENCOUNTER — Telehealth: Payer: Self-pay | Admitting: Neurology

## 2017-07-07 ENCOUNTER — Other Ambulatory Visit: Payer: PPO

## 2017-07-07 NOTE — Telephone Encounter (Signed)
We received a referral on pt for h/o chiari, numbness, and tingling. She has seen Dr. Brett Fairy is the past, but is requesting to see Dr. Jannifer Franklin. Are you both ok with this?

## 2017-07-07 NOTE — Telephone Encounter (Signed)
Okay with me for switch. 

## 2017-07-07 NOTE — Telephone Encounter (Signed)
Yes, She would be in good hands with Dr Jannifer Franklin. CD

## 2017-07-29 DIAGNOSIS — N39498 Other specified urinary incontinence: Secondary | ICD-10-CM | POA: Diagnosis not present

## 2017-07-29 DIAGNOSIS — I671 Cerebral aneurysm, nonruptured: Secondary | ICD-10-CM | POA: Diagnosis not present

## 2017-07-29 DIAGNOSIS — M545 Low back pain: Secondary | ICD-10-CM | POA: Diagnosis not present

## 2017-07-31 DIAGNOSIS — G894 Chronic pain syndrome: Secondary | ICD-10-CM | POA: Diagnosis not present

## 2017-07-31 DIAGNOSIS — R51 Headache: Secondary | ICD-10-CM | POA: Diagnosis not present

## 2017-07-31 DIAGNOSIS — M961 Postlaminectomy syndrome, not elsewhere classified: Secondary | ICD-10-CM | POA: Diagnosis not present

## 2017-07-31 DIAGNOSIS — Z79891 Long term (current) use of opiate analgesic: Secondary | ICD-10-CM | POA: Diagnosis not present

## 2017-08-10 DIAGNOSIS — R531 Weakness: Secondary | ICD-10-CM | POA: Diagnosis not present

## 2017-08-10 DIAGNOSIS — Z8669 Personal history of other diseases of the nervous system and sense organs: Secondary | ICD-10-CM | POA: Diagnosis not present

## 2017-08-10 DIAGNOSIS — Q796 Ehlers-Danlos syndrome: Secondary | ICD-10-CM | POA: Diagnosis not present

## 2017-08-10 DIAGNOSIS — Z87891 Personal history of nicotine dependence: Secondary | ICD-10-CM | POA: Diagnosis not present

## 2017-08-10 DIAGNOSIS — R51 Headache: Secondary | ICD-10-CM | POA: Diagnosis not present

## 2017-08-10 DIAGNOSIS — R150 Incomplete defecation: Secondary | ICD-10-CM | POA: Diagnosis not present

## 2017-08-10 DIAGNOSIS — G959 Disease of spinal cord, unspecified: Secondary | ICD-10-CM | POA: Diagnosis not present

## 2017-08-10 DIAGNOSIS — Z981 Arthrodesis status: Secondary | ICD-10-CM | POA: Diagnosis not present

## 2017-08-10 DIAGNOSIS — N3946 Mixed incontinence: Secondary | ICD-10-CM | POA: Diagnosis not present

## 2017-08-10 DIAGNOSIS — R29898 Other symptoms and signs involving the musculoskeletal system: Secondary | ICD-10-CM | POA: Diagnosis not present

## 2017-08-10 DIAGNOSIS — R32 Unspecified urinary incontinence: Secondary | ICD-10-CM | POA: Diagnosis not present

## 2017-08-10 DIAGNOSIS — N812 Incomplete uterovaginal prolapse: Secondary | ICD-10-CM | POA: Diagnosis not present

## 2017-08-10 DIAGNOSIS — Z85841 Personal history of malignant neoplasm of brain: Secondary | ICD-10-CM | POA: Diagnosis not present

## 2017-08-11 ENCOUNTER — Other Ambulatory Visit: Payer: Self-pay | Admitting: Gynecology

## 2017-08-11 DIAGNOSIS — Z1231 Encounter for screening mammogram for malignant neoplasm of breast: Secondary | ICD-10-CM

## 2017-08-12 ENCOUNTER — Ambulatory Visit
Admission: RE | Admit: 2017-08-12 | Discharge: 2017-08-12 | Disposition: A | Discharge status: Home / Self Care | Payer: PPO | Source: Ambulatory Visit | Attending: Gynecology | Admitting: Gynecology

## 2017-08-12 DIAGNOSIS — Z1231 Encounter for screening mammogram for malignant neoplasm of breast: Secondary | ICD-10-CM | POA: Diagnosis not present

## 2017-08-13 ENCOUNTER — Other Ambulatory Visit: Payer: Self-pay | Admitting: Gynecology

## 2017-08-13 DIAGNOSIS — R928 Other abnormal and inconclusive findings on diagnostic imaging of breast: Secondary | ICD-10-CM

## 2017-08-17 ENCOUNTER — Ambulatory Visit
Admission: RE | Admit: 2017-08-17 | Discharge: 2017-08-17 | Disposition: A | Discharge status: Home / Self Care | Payer: PPO | Source: Ambulatory Visit | Attending: Gynecology | Admitting: Gynecology

## 2017-08-17 DIAGNOSIS — N6320 Unspecified lump in the left breast, unspecified quadrant: Secondary | ICD-10-CM | POA: Diagnosis not present

## 2017-08-17 DIAGNOSIS — R928 Other abnormal and inconclusive findings on diagnostic imaging of breast: Secondary | ICD-10-CM

## 2017-08-17 DIAGNOSIS — R922 Inconclusive mammogram: Secondary | ICD-10-CM | POA: Diagnosis not present

## 2017-08-25 DIAGNOSIS — M5134 Other intervertebral disc degeneration, thoracic region: Secondary | ICD-10-CM | POA: Diagnosis not present

## 2017-09-07 DIAGNOSIS — K5902 Outlet dysfunction constipation: Secondary | ICD-10-CM | POA: Diagnosis not present

## 2017-09-07 DIAGNOSIS — N393 Stress incontinence (female) (male): Secondary | ICD-10-CM | POA: Diagnosis not present

## 2017-09-07 DIAGNOSIS — N3946 Mixed incontinence: Secondary | ICD-10-CM | POA: Diagnosis not present

## 2017-09-07 DIAGNOSIS — N812 Incomplete uterovaginal prolapse: Secondary | ICD-10-CM | POA: Diagnosis not present

## 2017-09-09 ENCOUNTER — Other Ambulatory Visit: Payer: Self-pay

## 2017-09-09 ENCOUNTER — Encounter: Payer: Self-pay | Admitting: Neurology

## 2017-09-09 ENCOUNTER — Ambulatory Visit: Payer: PPO | Admitting: Neurology

## 2017-09-09 VITALS — BP 108/70 | HR 63 | Ht 67.0 in | Wt 218.0 lb

## 2017-09-09 DIAGNOSIS — R202 Paresthesia of skin: Secondary | ICD-10-CM

## 2017-09-09 NOTE — Progress Notes (Signed)
Reason for visit: Paresthesias  Referring physician: Dr. Donata Clay is a 52 y.o. female  History of present illness:  [name] is a 52 year old right-handed white female with a history of Ehlers-Danlos syndrome.  The patient comes in today for an evaluation of paresthesias that developed in both lower extremities in April 2019.  The patient underwent MRI evaluation of the entire neuraxis, MRI of the brain, cervical spine, thoracic spine, and lumbar spine was done.  The MRI of the thoracic spine revealed small serpiginous vessels on the dorsal part of the spinal cord at the T7-8 level extending inferiorly to the conus.  The possibility of a dural AVM was considered.  The patient apparently has been seen by a neurosurgeon in [place], [place], Dr. Rogers Blocker.  A angiographic procedure was considered but Dr. Rogers Blocker decided not to do it.  The radiology physician who read the MRI of the thoracic spine recommended a dynamic contrast-enhanced MRA of the spine.  This has not yet been done.  The patient has had blood work done as well that included a vitamin B12 level, serum protein electrophoresis, ANA, and sedimentation rate.  The patient claims that this evaluation was normal.  The patient initially had numbness that developed spontaneously on the anterior portions of the thighs bilaterally, she had increased problems with urinary incontinence, her urologist felt this was secondary to bladder prolapse.  The patient felt somewhat weak in the legs.  Her symptoms have gradually improved over the last 6 weeks or so.  She feels close to being back to normal at this point.  She did not have any falls.  She has chronic numbness on the forearm on the left, she had no change in function of her upper extremities.  The patient does have chronic daily headaches that appear to be consistent with migraine.  The patient has photophobia, phonophobia, weather changes affect her headache.  She has had daily  headaches since 2010.  She had Arnold-Chiari malformation diagnosed and had a suboccipital craniectomy, but this did not help her headache.  The patient comes to this office for an evaluation.  Past Medical History:  Diagnosis Date  . ADHD (attention deficit hyperactivity disorder)   . Anxiety   . Arnold-Chiari deformity ([place])   . Brain tumor ([place])   . Cerebral venous sinus thrombosis, remote, resolved 03/28/2013  . Chronic pain   . EDS (Ehlers-Danlos syndrome)   . Eustachian tube dysfunction   . GERD (gastroesophageal reflux disease)   . Hemorrhoids   . HSV infection   . Hypothyroidism   . Migraine   . Nystagmus, positional, central type 03/28/2013  . Obstructive sleep apnea   . Other malaise and fatigue 03/28/2013   " The patient reports feeling happy, ED, sore" on she has undergone at Chiari malformation surgery decompression in 2011. Prior to the surgery were chief complaints were weakness numbness and neck problems. She had swallowing difficulties and dizziness;  Classic Chiari presentation. But she didn't have headaches.  . Vitamin D deficiency     Past Surgical History:  Procedure Laterality Date  . ARNOLD CHIARI REPAIR    . BRAIN SURGERY    . BREAST BIOPSY Left   . BREAST EXCISIONAL BIOPSY Right   . CHOLECYSTECTOMY    . DILATION AND CURETTAGE OF UTERUS    . ENDOMETRIAL ABLATION  02/09/2013   HerOption  . INTRAUTERINE DEVICE INSERTION  10/2010   Mirena  . IUD REMOVAL  11/2010  could not tolerate hormonal side effects  . neck fusion c1-c3    . TONSILLECTOMY AND ADENOIDECTOMY      Family History  Problem Relation Age of Onset  . Diabetes Maternal Aunt   . Breast cancer Maternal Aunt        40's  . Diabetes Maternal Uncle   . Breast cancer Paternal Aunt 73  . Diabetes Maternal Grandmother   . Cancer Maternal Grandmother        SKIN AND LUNG  . Diabetes Paternal Grandmother   . Breast cancer Maternal Aunt        50's  . Breast cancer Maternal Aunt        50's     Social history:  reports that she quit smoking about 10 years ago. She has never used smokeless tobacco. She reports that she does not drink alcohol or use drugs.  Medications:  Prior to Admission medications   Medication Sig Start Date End Date Taking? Authorizing Provider  ALPRAZolam Duanne Moron) 0.5 MG tablet Take 0.5 mg by mouth 3 (three) times daily as needed. Anxiety   Yes [provider]  diazepam (VALIUM) 5 MG tablet Take 1 tablet (5 mg total) by mouth every 6 (six) hours as needed for anxiety. 02/07/13  Yes Fontaine, Belinda Block, MD  levothyroxine (SYNTHROID, LEVOTHROID) 125 MCG tablet Take 125 mcg by mouth daily before breakfast.   Yes [provider]  Omeprazole Magnesium (PRILOSEC OTC PO) Take 1 capsule by mouth at bedtime.    Yes [provider]  Oxycodone HCl 10 MG TABS Take 10 mg by mouth every 4 (four) hours as needed (pain.).    Yes [provider]  promethazine (PHENERGAN) 25 MG tablet Take 25 mg by mouth every 6 (six) hours as needed. Nausea   Yes [provider]  valACYclovir (VALTREX) 1000 MG tablet Take 2 tabs (2 grams) po now and repeat in 12 hours prn coldsore. 05/25/17  Yes Fontaine, Belinda Block, MD      Allergies  Allergen Reactions  . Other Anaphylaxis    Steroids and Zucchini   . Prednisone Anaphylaxis    ROS:  Out of a complete 14 system review of symptoms, the patient complains only of the following symptoms, and all other reviewed systems are negative.  Fatigue Birthmarks Urinary incontinence, constipation Easy bruising Joint pain, muscle cramps, aching muscles Memory loss, headache, numbness, weakness Insomnia, sleepiness, snoring  Blood pressure 108/70, pulse 63, height 5\' 7"  (1.702 m), weight 218 lb (98.9 kg).  Physical Exam  General: The patient is alert and cooperative at the time of the examination.  The patient is moderately obese.  Eyes: Pupils are equal, round, and reactive to light. Discs are flat  bilaterally.  Neck: The neck is supple, no carotid bruits are noted.  Respiratory: The respiratory examination is clear.  Cardiovascular: The cardiovascular examination reveals a regular rate and rhythm, no obvious murmurs or rubs are noted.  Neuromuscular: Range move the low back is full.  Skin: Extremities are without significant edema.  Neurologic Exam  Mental status: The patient is alert and oriented x 3 at the time of the examination. The patient has apparent normal recent and remote memory, with an apparently normal attention span and concentration ability.  Cranial nerves: Facial symmetry is present. There is good sensation of the face to pinprick and soft touch bilaterally. The strength of the facial muscles and the muscles to head turning and shoulder shrug are normal bilaterally. Speech is well  enunciated, no aphasia or dysarthria is noted. Extraocular movements are full. Visual fields are full. The tongue is midline, and the patient has symmetric elevation of the soft palate. No obvious hearing deficits are noted.  Motor: The motor testing reveals 5 over 5 strength of all 4 extremities. Good symmetric motor tone is noted throughout.  Sensory: Sensory testing is intact to pinprick, soft touch, vibration sensation, and position sense on all 4 extremities. No evidence of extinction is noted.  Coordination: Cerebellar testing reveals good finger-nose-finger and heel-to-shin bilaterally.  Gait and station: Gait is normal. Tandem gait is normal. Romberg is negative. No drift is seen.  Reflexes: Deep tendon reflexes are symmetric and normal bilaterally. Toes are downgoing bilaterally.   Assessment/Plan:  1.  Paresthesias, lower extremities  2.  Chronic daily headache, migraine  3.  History of Ehlers-Danlos syndrome  The patient does need to be monitored for a possible dural AVM.  This may result in a slow progressive ischemia syndrome with a spinal cord.  The MRA evaluation  should be considered, the patient wishes to do this through her neurosurgeon in [place].  The patient will follow-up through this office in 6 months.  Fortunately, her symptoms have abated over the last several weeks.  Jill Alexanders MD 09/09/2017 9:01 AM  [facility] [address] [place], [place] [address]  Phone [phone] Fax [phone]

## 2017-09-25 DIAGNOSIS — M961 Postlaminectomy syndrome, not elsewhere classified: Secondary | ICD-10-CM | POA: Diagnosis not present

## 2017-09-25 DIAGNOSIS — Z79891 Long term (current) use of opiate analgesic: Secondary | ICD-10-CM | POA: Diagnosis not present

## 2017-09-25 DIAGNOSIS — R51 Headache: Secondary | ICD-10-CM | POA: Diagnosis not present

## 2017-09-25 DIAGNOSIS — G894 Chronic pain syndrome: Secondary | ICD-10-CM | POA: Diagnosis not present

## 2017-10-07 DIAGNOSIS — M545 Low back pain: Secondary | ICD-10-CM | POA: Diagnosis not present

## 2017-10-07 DIAGNOSIS — F419 Anxiety disorder, unspecified: Secondary | ICD-10-CM | POA: Diagnosis not present

## 2017-10-07 DIAGNOSIS — Z0000 Encounter for general adult medical examination without abnormal findings: Secondary | ICD-10-CM | POA: Diagnosis not present

## 2017-10-09 DIAGNOSIS — Q796 Ehlers-Danlos syndrome: Secondary | ICD-10-CM | POA: Diagnosis not present

## 2017-10-09 DIAGNOSIS — Q068 Other specified congenital malformations of spinal cord: Secondary | ICD-10-CM | POA: Diagnosis not present

## 2017-11-27 DIAGNOSIS — G894 Chronic pain syndrome: Secondary | ICD-10-CM | POA: Diagnosis not present

## 2017-11-27 DIAGNOSIS — M961 Postlaminectomy syndrome, not elsewhere classified: Secondary | ICD-10-CM | POA: Diagnosis not present

## 2017-11-27 DIAGNOSIS — R51 Headache: Secondary | ICD-10-CM | POA: Diagnosis not present

## 2017-11-27 DIAGNOSIS — Z79891 Long term (current) use of opiate analgesic: Secondary | ICD-10-CM | POA: Diagnosis not present

## 2017-12-03 DIAGNOSIS — D1801 Hemangioma of skin and subcutaneous tissue: Secondary | ICD-10-CM | POA: Diagnosis not present

## 2017-12-03 DIAGNOSIS — L821 Other seborrheic keratosis: Secondary | ICD-10-CM | POA: Diagnosis not present

## 2017-12-03 DIAGNOSIS — D225 Melanocytic nevi of trunk: Secondary | ICD-10-CM | POA: Diagnosis not present

## 2017-12-03 DIAGNOSIS — Z85828 Personal history of other malignant neoplasm of skin: Secondary | ICD-10-CM | POA: Diagnosis not present

## 2017-12-03 DIAGNOSIS — L812 Freckles: Secondary | ICD-10-CM | POA: Diagnosis not present

## 2017-12-04 DIAGNOSIS — M545 Low back pain: Secondary | ICD-10-CM | POA: Diagnosis not present

## 2017-12-04 DIAGNOSIS — R202 Paresthesia of skin: Secondary | ICD-10-CM | POA: Diagnosis not present

## 2018-01-25 DIAGNOSIS — G894 Chronic pain syndrome: Secondary | ICD-10-CM | POA: Diagnosis not present

## 2018-01-25 DIAGNOSIS — M961 Postlaminectomy syndrome, not elsewhere classified: Secondary | ICD-10-CM | POA: Diagnosis not present

## 2018-01-25 DIAGNOSIS — R51 Headache: Secondary | ICD-10-CM | POA: Diagnosis not present

## 2018-01-25 DIAGNOSIS — Z79891 Long term (current) use of opiate analgesic: Secondary | ICD-10-CM | POA: Diagnosis not present

## 2018-01-26 DIAGNOSIS — R3915 Urgency of urination: Secondary | ICD-10-CM | POA: Diagnosis not present

## 2018-01-26 DIAGNOSIS — R350 Frequency of micturition: Secondary | ICD-10-CM | POA: Diagnosis not present

## 2018-01-26 DIAGNOSIS — R3911 Hesitancy of micturition: Secondary | ICD-10-CM | POA: Diagnosis not present

## 2018-01-26 DIAGNOSIS — R32 Unspecified urinary incontinence: Secondary | ICD-10-CM | POA: Diagnosis not present

## 2018-01-28 DIAGNOSIS — M357 Hypermobility syndrome: Secondary | ICD-10-CM | POA: Diagnosis not present

## 2018-01-28 DIAGNOSIS — M546 Pain in thoracic spine: Secondary | ICD-10-CM | POA: Diagnosis not present

## 2018-01-28 DIAGNOSIS — Z981 Arthrodesis status: Secondary | ICD-10-CM | POA: Diagnosis not present

## 2018-02-16 DIAGNOSIS — M545 Low back pain: Secondary | ICD-10-CM | POA: Diagnosis not present

## 2018-02-16 DIAGNOSIS — M546 Pain in thoracic spine: Secondary | ICD-10-CM | POA: Diagnosis not present

## 2018-02-16 DIAGNOSIS — M542 Cervicalgia: Secondary | ICD-10-CM | POA: Diagnosis not present

## 2018-02-23 ENCOUNTER — Other Ambulatory Visit: Payer: Self-pay | Admitting: Neurosurgery

## 2018-02-23 DIAGNOSIS — M545 Low back pain, unspecified: Secondary | ICD-10-CM

## 2018-02-23 DIAGNOSIS — M542 Cervicalgia: Secondary | ICD-10-CM

## 2018-03-08 DIAGNOSIS — K219 Gastro-esophageal reflux disease without esophagitis: Secondary | ICD-10-CM | POA: Diagnosis not present

## 2018-03-08 DIAGNOSIS — R112 Nausea with vomiting, unspecified: Secondary | ICD-10-CM | POA: Diagnosis not present

## 2018-03-08 DIAGNOSIS — K449 Diaphragmatic hernia without obstruction or gangrene: Secondary | ICD-10-CM | POA: Diagnosis not present

## 2018-03-11 DIAGNOSIS — J069 Acute upper respiratory infection, unspecified: Secondary | ICD-10-CM | POA: Diagnosis not present

## 2018-03-11 DIAGNOSIS — M25519 Pain in unspecified shoulder: Secondary | ICD-10-CM | POA: Diagnosis not present

## 2018-03-11 DIAGNOSIS — M7552 Bursitis of left shoulder: Secondary | ICD-10-CM | POA: Diagnosis not present

## 2018-03-11 DIAGNOSIS — J449 Chronic obstructive pulmonary disease, unspecified: Secondary | ICD-10-CM | POA: Diagnosis not present

## 2018-03-11 DIAGNOSIS — M25559 Pain in unspecified hip: Secondary | ICD-10-CM | POA: Diagnosis not present

## 2018-03-11 DIAGNOSIS — M1990 Unspecified osteoarthritis, unspecified site: Secondary | ICD-10-CM | POA: Diagnosis not present

## 2018-03-11 DIAGNOSIS — M419 Scoliosis, unspecified: Secondary | ICD-10-CM | POA: Diagnosis not present

## 2018-03-11 DIAGNOSIS — M549 Dorsalgia, unspecified: Secondary | ICD-10-CM | POA: Diagnosis not present

## 2018-03-19 ENCOUNTER — Ambulatory Visit: Payer: PPO | Admitting: Neurology

## 2018-03-23 DIAGNOSIS — G894 Chronic pain syndrome: Secondary | ICD-10-CM | POA: Diagnosis not present

## 2018-03-23 DIAGNOSIS — Z79891 Long term (current) use of opiate analgesic: Secondary | ICD-10-CM | POA: Diagnosis not present

## 2018-03-23 DIAGNOSIS — M961 Postlaminectomy syndrome, not elsewhere classified: Secondary | ICD-10-CM | POA: Diagnosis not present

## 2018-03-23 DIAGNOSIS — R51 Headache: Secondary | ICD-10-CM | POA: Diagnosis not present

## 2018-03-29 DIAGNOSIS — M545 Low back pain: Secondary | ICD-10-CM | POA: Diagnosis not present

## 2018-03-29 DIAGNOSIS — R200 Anesthesia of skin: Secondary | ICD-10-CM | POA: Diagnosis not present

## 2018-03-29 DIAGNOSIS — M357 Hypermobility syndrome: Secondary | ICD-10-CM | POA: Diagnosis not present

## 2018-03-29 DIAGNOSIS — Z981 Arthrodesis status: Secondary | ICD-10-CM | POA: Diagnosis not present

## 2018-03-29 DIAGNOSIS — M542 Cervicalgia: Secondary | ICD-10-CM | POA: Diagnosis not present

## 2018-03-29 DIAGNOSIS — M546 Pain in thoracic spine: Secondary | ICD-10-CM | POA: Diagnosis not present

## 2018-03-29 DIAGNOSIS — R51 Headache: Secondary | ICD-10-CM | POA: Diagnosis not present

## 2018-03-30 DIAGNOSIS — R51 Headache: Secondary | ICD-10-CM | POA: Diagnosis not present

## 2018-03-30 DIAGNOSIS — M545 Low back pain: Secondary | ICD-10-CM | POA: Diagnosis not present

## 2018-03-30 DIAGNOSIS — R200 Anesthesia of skin: Secondary | ICD-10-CM | POA: Diagnosis not present

## 2018-03-30 DIAGNOSIS — M542 Cervicalgia: Secondary | ICD-10-CM | POA: Diagnosis not present

## 2018-04-01 DIAGNOSIS — Z981 Arthrodesis status: Secondary | ICD-10-CM | POA: Diagnosis not present

## 2018-04-01 DIAGNOSIS — M357 Hypermobility syndrome: Secondary | ICD-10-CM | POA: Diagnosis not present

## 2018-04-01 DIAGNOSIS — M546 Pain in thoracic spine: Secondary | ICD-10-CM | POA: Diagnosis not present

## 2018-04-02 DIAGNOSIS — M549 Dorsalgia, unspecified: Secondary | ICD-10-CM | POA: Diagnosis not present

## 2018-04-21 ENCOUNTER — Telehealth: Payer: Self-pay

## 2018-04-21 ENCOUNTER — Encounter: Payer: Self-pay | Admitting: Neurology

## 2018-04-21 ENCOUNTER — Ambulatory Visit: Payer: PPO | Admitting: Neurology

## 2018-04-21 ENCOUNTER — Other Ambulatory Visit: Payer: Self-pay

## 2018-04-21 DIAGNOSIS — G43019 Migraine without aura, intractable, without status migrainosus: Secondary | ICD-10-CM | POA: Diagnosis not present

## 2018-04-21 DIAGNOSIS — Q288 Other specified congenital malformations of circulatory system: Secondary | ICD-10-CM | POA: Diagnosis not present

## 2018-04-21 HISTORY — DX: Migraine without aura, intractable, without status migrainosus: G43.019

## 2018-04-21 HISTORY — DX: Other specified congenital malformations of circulatory system: Q28.8

## 2018-04-21 MED ORDER — FREMANEZUMAB-VFRM 225 MG/1.5ML ~~LOC~~ SOSY: 225.0000 mg | PREFILLED_SYRINGE | SUBCUTANEOUS | 4 refills | Status: DC

## 2018-04-21 NOTE — Telephone Encounter (Signed)
PA completed for ajovy 225 mg/ml via cover my meds.  Key: [id number] - PA Case ID: [id number]   Message received from cover my meds. "You will receive an electronic determination in CoverMyMeds. You can see the latest determination by locating this request on your dashboard or by reopening this request. You will also receive a faxed copy of the determination. If you have any questions please contact [facility] at [phone]."

## 2018-04-21 NOTE — Progress Notes (Signed)
Reason for visit: Spinal dural AVM, intractable migraine headache  April Meyer is an 53 y.o. female  History of present illness:  April Meyer is a 53 year old right-handed white female with a history of Ehlers-Danlos syndrome and Arnold-Chiari malformation, status post suboccipital craniectomy.  The patient has had a longstanding history of intractable migraine headache, the largest activator of her headaches are weather changes.  The patient has been on a multitude of medications in the past without benefit including Topamax, amitriptyline, propranolol, Lyrica, gabapentin, and possibly Depakote.  The patient has gained benefit from Imitrex in the past, but her headaches are so frequent that she cannot take this medication daily.  Currently, she is using ibuprofen and Phenergan for the headache, the headaches are associated with blurred vision and spots in front of the eyes, she may have nausea and vomiting.  The Phenergan helps her sleep and helps the headache.  The patient is limited in what she can do day to day because of the headache primarily.  In April 2019, the patient began having onset of paresthesias down the legs from the waist to the feet with weakness and heaviness, the patient began having urinary incontinence.  The patient had MRI evaluation of the entire neuraxis, evidence of a dural spinal AVM was noted from the T7 level down to the conus.  The patient was evaluated at [facility] for this, no intervention was done as the patient seemed to improve with her symptoms, she has gotten back to the point where she has only some numbness on the lateral aspect of the feet bilaterally, she is now fully ambulatory, no weakness of the legs is noted, her bladder function has improved.  The patient does have some numbness in the fourth and fifth fingers of the hands bilaterally, this predates the onset of the symptoms in the legs, the patient does have a history of cervical spine surgery.  She returns to this  office for an evaluation.  Past Medical History:  Diagnosis Date  . ADHD (attention deficit hyperactivity disorder)   . Anxiety   . Arnold-Chiari deformity ([place])   . Brain tumor ([place])   . Cerebral venous sinus thrombosis, remote, resolved 03/28/2013  . Chronic pain   . EDS (Ehlers-Danlos syndrome)   . Eustachian tube dysfunction   . GERD (gastroesophageal reflux disease)   . Hemorrhoids   . HSV infection   . Hypothyroidism   . Migraine   . Nystagmus, positional, central type 03/28/2013  . Obstructive sleep apnea   . Other malaise and fatigue 03/28/2013   " The patient reports feeling happy, ED, sore" on she has undergone at Chiari malformation surgery decompression in 2011. Prior to the surgery were chief complaints were weakness numbness and neck problems. She had swallowing difficulties and dizziness;  Classic Chiari presentation. But she didn't have headaches.  . Vitamin D deficiency     Past Surgical History:  Procedure Laterality Date  . ARNOLD CHIARI REPAIR    . BRAIN SURGERY    . BREAST BIOPSY Left   . BREAST EXCISIONAL BIOPSY Right   . CHOLECYSTECTOMY    . DILATION AND CURETTAGE OF UTERUS    . ENDOMETRIAL ABLATION  02/09/2013   HerOption  . INTRAUTERINE DEVICE INSERTION  10/2010   Mirena  . IUD REMOVAL  11/2010   could not tolerate hormonal side effects  . neck fusion c1-c3    . TONSILLECTOMY AND ADENOIDECTOMY      Family History  Problem Relation Age  of Onset  . Diabetes Maternal Aunt   . Breast cancer Maternal Aunt        40's  . Diabetes Maternal Uncle   . Breast cancer Paternal Aunt 55  . Diabetes Maternal Grandmother   . Cancer Maternal Grandmother        SKIN AND LUNG  . Diabetes Paternal Grandmother   . Breast cancer Maternal Aunt        50's  . Breast cancer Maternal Aunt        50's    Social history:  reports that she quit smoking about 10 years ago. She has never used smokeless tobacco. She reports that she does not drink alcohol or use  drugs.    Allergies  Allergen Reactions  . Other Anaphylaxis    Steroids and Zucchini   . Prednisone Anaphylaxis    Medications:  Prior to Admission medications   Medication Sig Start Date End Date Taking? Authorizing Provider  ALPRAZolam Duanne Moron) 0.5 MG tablet Take 0.5 mg by mouth 3 (three) times daily as needed. Anxiety   Yes [provider]  diazepam (VALIUM) 5 MG tablet Take 1 tablet (5 mg total) by mouth every 6 (six) hours as needed for anxiety. 02/07/13  Yes Fontaine, Belinda Block, MD  levothyroxine (SYNTHROID, LEVOTHROID) 125 MCG tablet Take 125 mcg by mouth daily before breakfast.   Yes [provider]  Omeprazole Magnesium (PRILOSEC OTC PO) Take 1 capsule by mouth at bedtime.    Yes [provider]  Oxycodone HCl 10 MG TABS Take 10 mg by mouth every 4 (four) hours as needed (pain.).    Yes [provider]  promethazine (PHENERGAN) 25 MG tablet Take 25 mg by mouth every 6 (six) hours as needed. Nausea   Yes [provider]  valACYclovir (VALTREX) 1000 MG tablet Take 2 tabs (2 grams) po now and repeat in 12 hours prn coldsore. 05/25/17  Yes Fontaine, Belinda Block, MD    ROS:  Out of a complete 14 system review of symptoms, the patient complains only of the following symptoms, and all other reviewed systems are negative.  Fatigue Excessive eating Headache, numbness Back pain Depression  Blood pressure 114/75, pulse 67, height 5\' 7"  (1.702 m), weight 201 lb (91.2 kg).  Physical Exam  General: The patient is alert and cooperative at the time of the examination.  Skin: No significant peripheral edema is noted.   Neurologic Exam  Mental status: The patient is alert and oriented x 3 at the time of the examination. The patient has apparent normal recent and remote memory, with an apparently normal attention span and concentration ability.   Cranial nerves: Facial symmetry is present. Speech is normal, no aphasia or dysarthria is  noted. Extraocular movements are full. Visual fields are full.  Motor: The patient has good strength in all 4 extremities.  Sensory examination: Soft touch sensation is symmetric on the face, arms, and legs.  Coordination: The patient has good finger-nose-finger and heel-to-shin bilaterally.  Gait and station: The patient has a normal gait. Tandem gait is normal. Romberg is positive, the patient falls to the right. No drift is seen.  Reflexes: Deep tendon reflexes are symmetric.   Assessment/Plan:  1.  Spinal dural AVM  2.  Intractable migraine headache  3.  History of Ehlers-Danlos syndrome  The patient is mainly disabled from her migraine headaches currently.  She had significant issues with the lower extremities starting in April 2019, but this has since improved, intervention  of the AVM was not considered because of this.  The patient wishes to have some local follow-up through neurosurgery for this issue.  I will make the referral.  The patient will be placed on Ajovy for her headache, if this is not effective the patient will contact our office and we will consider Botox treatments for her migraine.  The patient will follow-up in 4 months otherwise.  Jill Alexanders MD 04/21/2018 7:56 AM  [facility] [address] [place], [place] [address]  Phone [phone] Fax [phone]

## 2018-04-22 NOTE — Telephone Encounter (Signed)
PA approval received via cover my meds.  PA effective 04/22/18-03/10/19. [facility] notified.

## 2018-05-03 DIAGNOSIS — Z6832 Body mass index (BMI) 32.0-32.9, adult: Secondary | ICD-10-CM | POA: Diagnosis not present

## 2018-05-03 DIAGNOSIS — I671 Cerebral aneurysm, nonruptured: Secondary | ICD-10-CM | POA: Diagnosis not present

## 2018-05-18 DIAGNOSIS — M961 Postlaminectomy syndrome, not elsewhere classified: Secondary | ICD-10-CM | POA: Diagnosis not present

## 2018-05-18 DIAGNOSIS — R51 Headache: Secondary | ICD-10-CM | POA: Diagnosis not present

## 2018-05-18 DIAGNOSIS — Z79891 Long term (current) use of opiate analgesic: Secondary | ICD-10-CM | POA: Diagnosis not present

## 2018-05-18 DIAGNOSIS — G894 Chronic pain syndrome: Secondary | ICD-10-CM | POA: Diagnosis not present

## 2018-05-21 DIAGNOSIS — R509 Fever, unspecified: Secondary | ICD-10-CM | POA: Diagnosis not present

## 2018-05-21 DIAGNOSIS — R05 Cough: Secondary | ICD-10-CM | POA: Diagnosis not present

## 2018-05-27 ENCOUNTER — Encounter: Payer: Self-pay | Admitting: Gynecology

## 2018-05-27 ENCOUNTER — Other Ambulatory Visit: Payer: Self-pay

## 2018-05-27 ENCOUNTER — Ambulatory Visit (INDEPENDENT_AMBULATORY_CARE_PROVIDER_SITE_OTHER): Payer: PPO | Admitting: Gynecology

## 2018-05-27 ENCOUNTER — Encounter: Payer: PPO | Admitting: Gynecology

## 2018-05-27 VITALS — BP 124/82 | Ht 67.0 in | Wt 204.0 lb

## 2018-05-27 DIAGNOSIS — Z01419 Encounter for gynecological examination (general) (routine) without abnormal findings: Secondary | ICD-10-CM | POA: Diagnosis not present

## 2018-05-27 DIAGNOSIS — B001 Herpesviral vesicular dermatitis: Secondary | ICD-10-CM | POA: Diagnosis not present

## 2018-05-27 DIAGNOSIS — Z7251 High risk heterosexual behavior: Secondary | ICD-10-CM

## 2018-05-27 MED ORDER — VALACYCLOVIR HCL 1 G PO TABS: ORAL_TABLET | ORAL | 1 refills | Status: DC

## 2018-05-27 NOTE — Patient Instructions (Signed)
Follow-up in 1 year for annual exam, sooner if any issues. 

## 2018-05-27 NOTE — Progress Notes (Signed)
    April Meyer 03-Feb-1966 [id number]        53 y.o.  G2P1011 for breast and pelvic exam  Past medical history,surgical history, problem list, medications, allergies, family history and social history were all reviewed and documented as reviewed in the EPIC chart.  ROS:  Performed with pertinent positives and negatives included in the history, assessment and plan.   Additional significant findings : None   Exam: [name] assistant Vitals:   05/27/18 1046  BP: 124/82  Weight: 204 lb (92.5 kg)  Height: 5\' 7"  (1.702 m)   Body mass index is 31.95 kg/m.  General appearance:  Normal affect, orientation and appearance. Skin: Grossly normal HEENT: Without gross lesions.  No cervical or supraclavicular adenopathy. Thyroid normal.  Lungs:  Clear without wheezing, rales or rhonchi Cardiac: RR, without RMG Abdominal:  Soft, nontender, without masses, guarding, rebound, organomegaly or hernia Breasts:  Examined lying and sitting without masses, retractions, discharge or axillary adenopathy. Pelvic:  Ext, BUS, Vagina: Normal  Cervix: Normal  Uterus: Anteverted, normal size, shape and contour, midline and mobile nontender   Adnexa: Without masses or tenderness    Anus and perineum: Normal   Rectovaginal: Normal sphincter tone without palpated masses or tenderness.    Assessment/Plan:  53 y.o. G58P1011 female for annual gynecologic exam.  With regular light menses, abstinent contraception  1. Regular light menses status post endometrial ablation in the past.  No menopausal symptoms. 2. Mammography coming due in June and I reminded her to schedule this.  Breast exam normal today. 3. Pap smear/HPV 2018.  No Pap smear done today.  No history of abnormal Pap smears.  Plan repeat Pap smear/HPV at 5-year interval per current screening guidelines. 4. Colonoscopy 2018.  Repeat at their recommended interval. 5. Herpes labialis and genitalis.  Uses Valtrex 2 g at earliest onset with repeat at 12  hours which seems to work for both areas.  #20 with 1 refill provided. 6. Health maintenance.  No routine lab work done as patient does this elsewhere.  Follow-up 1 year, sooner as needed.   Anastasio Auerbach MD, 11:08 AM 05/27/2018

## 2018-06-29 DIAGNOSIS — F419 Anxiety disorder, unspecified: Secondary | ICD-10-CM | POA: Diagnosis not present

## 2018-06-29 DIAGNOSIS — Z0000 Encounter for general adult medical examination without abnormal findings: Secondary | ICD-10-CM | POA: Diagnosis not present

## 2018-07-06 DIAGNOSIS — R51 Headache: Secondary | ICD-10-CM | POA: Diagnosis not present

## 2018-07-06 DIAGNOSIS — E039 Hypothyroidism, unspecified: Secondary | ICD-10-CM | POA: Diagnosis not present

## 2018-07-06 DIAGNOSIS — G935 Compression of brain: Secondary | ICD-10-CM | POA: Diagnosis not present

## 2018-07-06 DIAGNOSIS — M545 Low back pain: Secondary | ICD-10-CM | POA: Diagnosis not present

## 2018-07-06 DIAGNOSIS — Q796 Ehlers-Danlos syndrome, unspecified: Secondary | ICD-10-CM | POA: Diagnosis not present

## 2018-07-06 DIAGNOSIS — R202 Paresthesia of skin: Secondary | ICD-10-CM | POA: Diagnosis not present

## 2018-07-06 DIAGNOSIS — M419 Scoliosis, unspecified: Secondary | ICD-10-CM | POA: Diagnosis not present

## 2018-07-06 DIAGNOSIS — J449 Chronic obstructive pulmonary disease, unspecified: Secondary | ICD-10-CM | POA: Diagnosis not present

## 2018-07-06 DIAGNOSIS — E785 Hyperlipidemia, unspecified: Secondary | ICD-10-CM | POA: Diagnosis not present

## 2018-07-06 DIAGNOSIS — Z0000 Encounter for general adult medical examination without abnormal findings: Secondary | ICD-10-CM | POA: Diagnosis not present

## 2018-07-06 DIAGNOSIS — M25552 Pain in left hip: Secondary | ICD-10-CM | POA: Diagnosis not present

## 2018-07-06 DIAGNOSIS — F419 Anxiety disorder, unspecified: Secondary | ICD-10-CM | POA: Diagnosis not present

## 2018-07-09 DIAGNOSIS — M7542 Impingement syndrome of left shoulder: Secondary | ICD-10-CM | POA: Diagnosis not present

## 2018-07-09 DIAGNOSIS — M25512 Pain in left shoulder: Secondary | ICD-10-CM | POA: Diagnosis not present

## 2018-07-13 DIAGNOSIS — M961 Postlaminectomy syndrome, not elsewhere classified: Secondary | ICD-10-CM | POA: Diagnosis not present

## 2018-07-13 DIAGNOSIS — R51 Headache: Secondary | ICD-10-CM | POA: Diagnosis not present

## 2018-07-13 DIAGNOSIS — G894 Chronic pain syndrome: Secondary | ICD-10-CM | POA: Diagnosis not present

## 2018-07-13 DIAGNOSIS — Z79891 Long term (current) use of opiate analgesic: Secondary | ICD-10-CM | POA: Diagnosis not present

## 2018-07-14 DIAGNOSIS — M25512 Pain in left shoulder: Secondary | ICD-10-CM | POA: Diagnosis not present

## 2018-08-09 DIAGNOSIS — Q796 Ehlers-Danlos syndrome, unspecified: Secondary | ICD-10-CM | POA: Diagnosis not present

## 2018-08-09 DIAGNOSIS — Q0700 Arnold-Chiari syndrome without spina bifida or hydrocephalus: Secondary | ICD-10-CM | POA: Diagnosis not present

## 2018-08-12 ENCOUNTER — Telehealth: Payer: Self-pay | Admitting: *Deleted

## 2018-08-12 MED ORDER — ONDANSETRON HCL 4 MG PO TABS: 4.0000 mg | ORAL_TABLET | Freq: Three times a day (TID) | ORAL | 0 refills | Status: DC | PRN

## 2018-08-12 NOTE — Telephone Encounter (Signed)
Based on the timing sounds hormonally related as it only happens right before her menses.  Recommend trial of Zofran 4 mg every 8 hour as needed #30.  See how she does with this

## 2018-08-12 NOTE — Telephone Encounter (Signed)
Patient called stating she has been having vomiting episodes the day before her cycle x 1 year. States vomiting can last from 1-3 days,mainly its 1 day and multiple episodes, cycles are still monthly, varies from light to a heavier flow. Asked if you think this could be hormone related, has a PCP but only has TSH checked there per patient. Please advise

## 2018-08-12 NOTE — Telephone Encounter (Signed)
Patient aware, Rx sent.  

## 2018-08-13 ENCOUNTER — Other Ambulatory Visit: Payer: Self-pay | Admitting: Family Medicine

## 2018-08-13 DIAGNOSIS — E041 Nontoxic single thyroid nodule: Secondary | ICD-10-CM

## 2018-08-23 ENCOUNTER — Telehealth: Payer: Self-pay | Admitting: Neurology

## 2018-08-23 DIAGNOSIS — M25512 Pain in left shoulder: Secondary | ICD-10-CM | POA: Diagnosis not present

## 2018-08-23 NOTE — Telephone Encounter (Signed)
Due to current COVID 19 pandemic, our office is severely reducing in office visits until further notice, in order to minimize the risk to our patients and healthcare providers.   I called patient to discuss a virtual visit for her 6/17 appointment. Patient's voicemail is full, I will attempt to contact patient later.

## 2018-08-24 DIAGNOSIS — F411 Generalized anxiety disorder: Secondary | ICD-10-CM | POA: Diagnosis not present

## 2018-08-24 DIAGNOSIS — F331 Major depressive disorder, recurrent, moderate: Secondary | ICD-10-CM | POA: Diagnosis not present

## 2018-08-24 DIAGNOSIS — F4310 Post-traumatic stress disorder, unspecified: Secondary | ICD-10-CM | POA: Diagnosis not present

## 2018-08-25 ENCOUNTER — Ambulatory Visit: Payer: PPO | Admitting: Neurology

## 2018-08-26 ENCOUNTER — Ambulatory Visit
Admission: RE | Admit: 2018-08-26 | Discharge: 2018-08-26 | Disposition: A | Discharge status: Home / Self Care | Payer: PPO | Source: Ambulatory Visit | Attending: Family Medicine | Admitting: Family Medicine

## 2018-08-26 DIAGNOSIS — E0789 Other specified disorders of thyroid: Secondary | ICD-10-CM | POA: Diagnosis not present

## 2018-08-26 DIAGNOSIS — E041 Nontoxic single thyroid nodule: Secondary | ICD-10-CM

## 2018-08-27 DIAGNOSIS — F411 Generalized anxiety disorder: Secondary | ICD-10-CM | POA: Diagnosis not present

## 2018-08-27 DIAGNOSIS — F331 Major depressive disorder, recurrent, moderate: Secondary | ICD-10-CM | POA: Diagnosis not present

## 2018-08-27 DIAGNOSIS — F4310 Post-traumatic stress disorder, unspecified: Secondary | ICD-10-CM | POA: Diagnosis not present

## 2018-09-06 DIAGNOSIS — Z20828 Contact with and (suspected) exposure to other viral communicable diseases: Secondary | ICD-10-CM | POA: Diagnosis not present

## 2018-09-07 DIAGNOSIS — G894 Chronic pain syndrome: Secondary | ICD-10-CM | POA: Diagnosis not present

## 2018-09-07 DIAGNOSIS — M961 Postlaminectomy syndrome, not elsewhere classified: Secondary | ICD-10-CM | POA: Diagnosis not present

## 2018-09-07 DIAGNOSIS — Z79891 Long term (current) use of opiate analgesic: Secondary | ICD-10-CM | POA: Diagnosis not present

## 2018-09-07 DIAGNOSIS — R51 Headache: Secondary | ICD-10-CM | POA: Diagnosis not present

## 2018-09-09 DIAGNOSIS — F331 Major depressive disorder, recurrent, moderate: Secondary | ICD-10-CM | POA: Diagnosis not present

## 2018-09-09 DIAGNOSIS — F4310 Post-traumatic stress disorder, unspecified: Secondary | ICD-10-CM | POA: Diagnosis not present

## 2018-09-09 DIAGNOSIS — F411 Generalized anxiety disorder: Secondary | ICD-10-CM | POA: Diagnosis not present

## 2018-09-14 DIAGNOSIS — G8929 Other chronic pain: Secondary | ICD-10-CM | POA: Diagnosis not present

## 2018-09-14 DIAGNOSIS — L02214 Cutaneous abscess of groin: Secondary | ICD-10-CM | POA: Diagnosis not present

## 2018-09-15 DIAGNOSIS — L02214 Cutaneous abscess of groin: Secondary | ICD-10-CM | POA: Diagnosis not present

## 2018-09-17 DIAGNOSIS — F4310 Post-traumatic stress disorder, unspecified: Secondary | ICD-10-CM | POA: Diagnosis not present

## 2018-09-17 DIAGNOSIS — F331 Major depressive disorder, recurrent, moderate: Secondary | ICD-10-CM | POA: Diagnosis not present

## 2018-09-17 DIAGNOSIS — F411 Generalized anxiety disorder: Secondary | ICD-10-CM | POA: Diagnosis not present

## 2018-09-28 DIAGNOSIS — Q0700 Arnold-Chiari syndrome without spina bifida or hydrocephalus: Secondary | ICD-10-CM | POA: Diagnosis not present

## 2018-09-28 DIAGNOSIS — E039 Hypothyroidism, unspecified: Secondary | ICD-10-CM | POA: Diagnosis not present

## 2018-09-28 DIAGNOSIS — E559 Vitamin D deficiency, unspecified: Secondary | ICD-10-CM | POA: Diagnosis not present

## 2018-09-28 DIAGNOSIS — Z0000 Encounter for general adult medical examination without abnormal findings: Secondary | ICD-10-CM | POA: Diagnosis not present

## 2018-09-28 DIAGNOSIS — Z0100 Encounter for examination of eyes and vision without abnormal findings: Secondary | ICD-10-CM | POA: Diagnosis not present

## 2018-09-28 DIAGNOSIS — Z23 Encounter for immunization: Secondary | ICD-10-CM | POA: Diagnosis not present

## 2018-09-28 DIAGNOSIS — R7301 Impaired fasting glucose: Secondary | ICD-10-CM | POA: Diagnosis not present

## 2018-09-28 DIAGNOSIS — E785 Hyperlipidemia, unspecified: Secondary | ICD-10-CM | POA: Diagnosis not present

## 2018-10-15 DIAGNOSIS — F331 Major depressive disorder, recurrent, moderate: Secondary | ICD-10-CM | POA: Diagnosis not present

## 2018-10-15 DIAGNOSIS — F411 Generalized anxiety disorder: Secondary | ICD-10-CM | POA: Diagnosis not present

## 2018-10-15 DIAGNOSIS — F4310 Post-traumatic stress disorder, unspecified: Secondary | ICD-10-CM | POA: Diagnosis not present

## 2018-10-18 ENCOUNTER — Other Ambulatory Visit: Payer: Self-pay | Admitting: Gynecology

## 2018-10-18 DIAGNOSIS — Z1231 Encounter for screening mammogram for malignant neoplasm of breast: Secondary | ICD-10-CM

## 2018-10-22 DIAGNOSIS — G932 Benign intracranial hypertension: Secondary | ICD-10-CM | POA: Diagnosis not present

## 2018-10-22 DIAGNOSIS — Q796 Ehlers-Danlos syndrome, unspecified: Secondary | ICD-10-CM | POA: Diagnosis not present

## 2018-10-22 DIAGNOSIS — C719 Malignant neoplasm of brain, unspecified: Secondary | ICD-10-CM | POA: Diagnosis not present

## 2018-10-22 DIAGNOSIS — G935 Compression of brain: Secondary | ICD-10-CM | POA: Diagnosis not present

## 2018-10-22 DIAGNOSIS — M532X1 Spinal instabilities, occipito-atlanto-axial region: Secondary | ICD-10-CM | POA: Diagnosis not present

## 2018-11-01 DIAGNOSIS — G932 Benign intracranial hypertension: Secondary | ICD-10-CM | POA: Insufficient documentation

## 2018-11-01 DIAGNOSIS — R51 Headache: Secondary | ICD-10-CM | POA: Diagnosis not present

## 2018-11-01 DIAGNOSIS — H539 Unspecified visual disturbance: Secondary | ICD-10-CM | POA: Diagnosis not present

## 2018-11-05 DIAGNOSIS — G971 Other reaction to spinal and lumbar puncture: Secondary | ICD-10-CM | POA: Diagnosis not present

## 2018-11-19 DIAGNOSIS — F411 Generalized anxiety disorder: Secondary | ICD-10-CM | POA: Diagnosis not present

## 2018-11-19 DIAGNOSIS — F4310 Post-traumatic stress disorder, unspecified: Secondary | ICD-10-CM | POA: Diagnosis not present

## 2018-11-19 DIAGNOSIS — F341 Dysthymic disorder: Secondary | ICD-10-CM | POA: Diagnosis not present

## 2018-11-19 DIAGNOSIS — F331 Major depressive disorder, recurrent, moderate: Secondary | ICD-10-CM | POA: Diagnosis not present

## 2018-11-20 DIAGNOSIS — R079 Chest pain, unspecified: Secondary | ICD-10-CM | POA: Diagnosis not present

## 2018-11-20 DIAGNOSIS — R200 Anesthesia of skin: Secondary | ICD-10-CM | POA: Diagnosis not present

## 2018-11-20 DIAGNOSIS — R42 Dizziness and giddiness: Secondary | ICD-10-CM | POA: Diagnosis not present

## 2018-11-20 DIAGNOSIS — R0789 Other chest pain: Secondary | ICD-10-CM | POA: Diagnosis not present

## 2018-11-22 DIAGNOSIS — R51 Headache: Secondary | ICD-10-CM | POA: Diagnosis not present

## 2018-11-22 DIAGNOSIS — G894 Chronic pain syndrome: Secondary | ICD-10-CM | POA: Diagnosis not present

## 2018-11-22 DIAGNOSIS — M961 Postlaminectomy syndrome, not elsewhere classified: Secondary | ICD-10-CM | POA: Diagnosis not present

## 2018-11-22 DIAGNOSIS — Z79891 Long term (current) use of opiate analgesic: Secondary | ICD-10-CM | POA: Diagnosis not present

## 2018-11-25 DIAGNOSIS — Z23 Encounter for immunization: Secondary | ICD-10-CM | POA: Diagnosis not present

## 2018-11-25 DIAGNOSIS — Z0100 Encounter for examination of eyes and vision without abnormal findings: Secondary | ICD-10-CM | POA: Diagnosis not present

## 2018-11-25 DIAGNOSIS — Q0700 Arnold-Chiari syndrome without spina bifida or hydrocephalus: Secondary | ICD-10-CM | POA: Diagnosis not present

## 2018-11-25 DIAGNOSIS — Q796 Ehlers-Danlos syndrome, unspecified: Secondary | ICD-10-CM | POA: Diagnosis not present

## 2018-12-01 ENCOUNTER — Encounter: Payer: Self-pay | Admitting: Gynecology

## 2018-12-03 ENCOUNTER — Ambulatory Visit: Payer: PPO

## 2018-12-03 DIAGNOSIS — Q796 Ehlers-Danlos syndrome, unspecified: Secondary | ICD-10-CM | POA: Diagnosis not present

## 2018-12-03 DIAGNOSIS — G932 Benign intracranial hypertension: Secondary | ICD-10-CM | POA: Diagnosis not present

## 2018-12-07 DIAGNOSIS — Z85828 Personal history of other malignant neoplasm of skin: Secondary | ICD-10-CM | POA: Diagnosis not present

## 2018-12-07 DIAGNOSIS — D2271 Melanocytic nevi of right lower limb, including hip: Secondary | ICD-10-CM | POA: Diagnosis not present

## 2018-12-07 DIAGNOSIS — D225 Melanocytic nevi of trunk: Secondary | ICD-10-CM | POA: Diagnosis not present

## 2018-12-07 DIAGNOSIS — L812 Freckles: Secondary | ICD-10-CM | POA: Diagnosis not present

## 2018-12-07 DIAGNOSIS — L821 Other seborrheic keratosis: Secondary | ICD-10-CM | POA: Diagnosis not present

## 2018-12-24 DIAGNOSIS — G932 Benign intracranial hypertension: Secondary | ICD-10-CM | POA: Diagnosis not present

## 2018-12-24 DIAGNOSIS — Z20828 Contact with and (suspected) exposure to other viral communicable diseases: Secondary | ICD-10-CM | POA: Diagnosis not present

## 2018-12-24 DIAGNOSIS — Z01812 Encounter for preprocedural laboratory examination: Secondary | ICD-10-CM | POA: Diagnosis not present

## 2018-12-27 DIAGNOSIS — F419 Anxiety disorder, unspecified: Secondary | ICD-10-CM | POA: Diagnosis not present

## 2018-12-27 DIAGNOSIS — E039 Hypothyroidism, unspecified: Secondary | ICD-10-CM | POA: Diagnosis not present

## 2018-12-27 DIAGNOSIS — G932 Benign intracranial hypertension: Secondary | ICD-10-CM | POA: Diagnosis not present

## 2018-12-27 DIAGNOSIS — Z91018 Allergy to other foods: Secondary | ICD-10-CM | POA: Diagnosis not present

## 2018-12-27 DIAGNOSIS — K219 Gastro-esophageal reflux disease without esophagitis: Secondary | ICD-10-CM | POA: Diagnosis not present

## 2018-12-27 DIAGNOSIS — J45909 Unspecified asthma, uncomplicated: Secondary | ICD-10-CM | POA: Diagnosis not present

## 2018-12-27 DIAGNOSIS — Z888 Allergy status to other drugs, medicaments and biological substances status: Secondary | ICD-10-CM | POA: Diagnosis not present

## 2018-12-27 DIAGNOSIS — Z79890 Hormone replacement therapy (postmenopausal): Secondary | ICD-10-CM | POA: Diagnosis not present

## 2018-12-27 DIAGNOSIS — G4733 Obstructive sleep apnea (adult) (pediatric): Secondary | ICD-10-CM | POA: Diagnosis not present

## 2018-12-27 DIAGNOSIS — Z885 Allergy status to narcotic agent status: Secondary | ICD-10-CM | POA: Diagnosis not present

## 2018-12-27 DIAGNOSIS — Z87891 Personal history of nicotine dependence: Secondary | ICD-10-CM | POA: Diagnosis not present

## 2019-01-02 ENCOUNTER — Other Ambulatory Visit: Payer: Self-pay

## 2019-01-02 ENCOUNTER — Emergency Department (HOSPITAL_COMMUNITY)
Admission: EM | Admit: 2019-01-02 | Discharge: 2019-01-03 | Disposition: A | Discharge status: Home / Self Care | Payer: PPO | Attending: Emergency Medicine | Admitting: Emergency Medicine

## 2019-01-02 ENCOUNTER — Encounter (HOSPITAL_COMMUNITY): Payer: Self-pay | Admitting: Emergency Medicine

## 2019-01-02 DIAGNOSIS — R102 Pelvic and perineal pain: Secondary | ICD-10-CM | POA: Diagnosis not present

## 2019-01-02 DIAGNOSIS — R109 Unspecified abdominal pain: Secondary | ICD-10-CM

## 2019-01-02 DIAGNOSIS — G8918 Other acute postprocedural pain: Secondary | ICD-10-CM

## 2019-01-02 DIAGNOSIS — Z87891 Personal history of nicotine dependence: Secondary | ICD-10-CM | POA: Diagnosis not present

## 2019-01-02 DIAGNOSIS — N200 Calculus of kidney: Secondary | ICD-10-CM | POA: Diagnosis not present

## 2019-01-02 DIAGNOSIS — R1084 Generalized abdominal pain: Secondary | ICD-10-CM | POA: Diagnosis not present

## 2019-01-02 DIAGNOSIS — Z79899 Other long term (current) drug therapy: Secondary | ICD-10-CM | POA: Diagnosis not present

## 2019-01-02 DIAGNOSIS — R509 Fever, unspecified: Secondary | ICD-10-CM | POA: Insufficient documentation

## 2019-01-02 DIAGNOSIS — E039 Hypothyroidism, unspecified: Secondary | ICD-10-CM | POA: Diagnosis not present

## 2019-01-02 LAB — CBC WITH DIFFERENTIAL/PLATELET
Abs Immature Granulocytes: 0.02 10*3/uL (ref 0.00–0.07)
Basophils Absolute: 0.1 10*3/uL (ref 0.0–0.1)
Basophils Relative: 1 %
Eosinophils Absolute: 0.4 10*3/uL (ref 0.0–0.5)
Eosinophils Relative: 5 %
HCT: 34.2 % — ABNORMAL LOW (ref 36.0–46.0)
Hemoglobin: 11 g/dL — ABNORMAL LOW (ref 12.0–15.0)
Immature Granulocytes: 0 %
Lymphocytes Relative: 24 %
Lymphs Abs: 1.9 10*3/uL (ref 0.7–4.0)
MCH: 29.7 pg (ref 26.0–34.0)
MCHC: 32.2 g/dL (ref 30.0–36.0)
MCV: 92.4 fL (ref 80.0–100.0)
Monocytes Absolute: 0.7 10*3/uL (ref 0.1–1.0)
Monocytes Relative: 9 %
Neutro Abs: 4.7 10*3/uL (ref 1.7–7.7)
Neutrophils Relative %: 61 %
Platelets: 180 10*3/uL (ref 150–400)
RBC: 3.7 MIL/uL — ABNORMAL LOW (ref 3.87–5.11)
RDW: 12.7 % (ref 11.5–15.5)
WBC: 7.7 10*3/uL (ref 4.0–10.5)
nRBC: 0 % (ref 0.0–0.2)

## 2019-01-02 LAB — URINALYSIS, ROUTINE W REFLEX MICROSCOPIC
Bacteria, UA: NONE SEEN
Bilirubin Urine: NEGATIVE
Glucose, UA: NEGATIVE mg/dL
Ketones, ur: NEGATIVE mg/dL
Leukocytes,Ua: NEGATIVE
Nitrite: NEGATIVE
Protein, ur: NEGATIVE mg/dL
Specific Gravity, Urine: 1.017 (ref 1.005–1.030)
pH: 5 (ref 5.0–8.0)

## 2019-01-02 LAB — COMPREHENSIVE METABOLIC PANEL
ALT: 9 U/L (ref 0–44)
AST: 14 U/L — ABNORMAL LOW (ref 15–41)
Albumin: 3.4 g/dL — ABNORMAL LOW (ref 3.5–5.0)
Alkaline Phosphatase: 48 U/L (ref 38–126)
Anion gap: 8 (ref 5–15)
BUN: 7 mg/dL (ref 6–20)
CO2: 25 mmol/L (ref 22–32)
Calcium: 8.2 mg/dL — ABNORMAL LOW (ref 8.9–10.3)
Chloride: 106 mmol/L (ref 98–111)
Creatinine, Ser: 0.8 mg/dL (ref 0.44–1.00)
GFR calc Af Amer: >60 mL/min (ref >60)
GFR calc non Af Amer: >60 mL/min (ref >60)
Glucose, Bld: 108 mg/dL — ABNORMAL HIGH (ref 70–99)
Potassium: 4.2 mmol/L (ref 3.5–5.1)
Sodium: 139 mmol/L (ref 135–145)
Total Bilirubin: 0.4 mg/dL (ref 0.3–1.2)
Total Protein: 6.2 g/dL — ABNORMAL LOW (ref 6.5–8.1)

## 2019-01-02 LAB — I-STAT BETA HCG BLOOD, ED (MC, WL, AP ONLY): I-stat hCG, quantitative: <5 m[IU]/mL (ref <5)

## 2019-01-02 LAB — LACTIC ACID, PLASMA: Lactic Acid, Venous: 1.1 mmol/L (ref 0.5–1.9)

## 2019-01-02 MED ORDER — SODIUM CHLORIDE 0.9% FLUSH: 3.0000 mL | Freq: Once | INTRAVENOUS | Status: DC

## 2019-01-02 NOTE — ED Triage Notes (Addendum)
PT had a shunt placement to right upper head Thursday. Staples look appropriate, no drainage noted. States she has had fever o 101. PT states she has had right abdominal pain since the shunt placement, states hx of ovarian cysts and ectopic pregnancy. States similar pain. LMP Thursday. PT having some vaginal spotting currently.

## 2019-01-03 ENCOUNTER — Ambulatory Visit: Payer: PPO | Admitting: Neurology

## 2019-01-03 ENCOUNTER — Emergency Department (HOSPITAL_COMMUNITY): Payer: PPO

## 2019-01-03 DIAGNOSIS — N200 Calculus of kidney: Secondary | ICD-10-CM | POA: Diagnosis not present

## 2019-01-03 DIAGNOSIS — R102 Pelvic and perineal pain: Secondary | ICD-10-CM | POA: Diagnosis not present

## 2019-01-03 MED ORDER — CVS BATH OIL EX
0.20 | CUTANEOUS | Status: DC
Start: ? — End: 2019-01-03

## 2019-01-03 MED ORDER — QUINERVA 260 MG PO TABS
650.00 | ORAL_TABLET | ORAL | Status: DC
Start: ? — End: 2019-01-03

## 2019-01-03 MED ORDER — HYDROMORPHONE HCL 1 MG/ML IJ SOLN
1.0000 mg | Freq: Once | INTRAMUSCULAR | Status: AC
  Administered 2019-01-03: 1 mg via INTRAVENOUS
  Filled 2019-01-03: qty 1

## 2019-01-03 MED ORDER — Medication
Status: DC
Start: ? — End: 2019-01-03

## 2019-01-03 MED ORDER — DEWITTS PAIN RELIEVER 325 MG PO TABS
10.00 | ORAL_TABLET | ORAL | Status: DC
Start: ? — End: 2019-01-03

## 2019-01-03 MED ORDER — ARMOUR THYROID 30 MG PO TABS
1.00 | ORAL_TABLET | ORAL | Status: DC
Start: ? — End: 2019-01-03

## 2019-01-03 MED ORDER — Medication
500.00 | Status: DC
Start: 2018-12-31 — End: 2019-01-03

## 2019-01-03 MED ORDER — SODIUM CHLORIDE 0.9 % IV BOLUS (SEPSIS)
1000.0000 mL | Freq: Once | INTRAVENOUS | Status: AC
  Administered 2019-01-03: 1000 mL via INTRAVENOUS

## 2019-01-03 MED ORDER — Medication
10.00 | Status: DC
Start: ? — End: 2019-01-03

## 2019-01-03 MED ORDER — BAYER WOMENS 81-300 MG PO TABS
40.00 | ORAL_TABLET | ORAL | Status: DC
Start: 2018-12-31 — End: 2019-01-03

## 2019-01-03 MED ORDER — GENERIC EXTERNAL MEDICATION
125.00 | Status: DC
Start: 2019-01-03 — End: 2019-01-03

## 2019-01-03 MED ORDER — GENERIC EXTERNAL MEDICATION
125.00 | Status: DC
Start: ? — End: 2019-01-03

## 2019-01-03 MED ORDER — GUANABENZ ACETATE 4 MG PO TABS
10.00 | ORAL_TABLET | ORAL | Status: DC
Start: ? — End: 2019-01-03

## 2019-01-03 MED ORDER — EQUATE NICOTINE 4 MG MT GUM
4.00 | CHEWING_GUM | OROMUCOSAL | Status: DC
Start: ? — End: 2019-01-03

## 2019-01-03 MED ORDER — GENERIC EXTERNAL MEDICATION
Status: DC
Start: ? — End: 2019-01-03

## 2019-01-03 MED ORDER — GRANDPAS INDIAN CORN SOAP EX
500.00 | CUTANEOUS | Status: DC
Start: ? — End: 2019-01-03

## 2019-01-03 MED ORDER — RU-HIST FORTE PO
15.00 | ORAL | Status: DC
Start: 2018-12-31 — End: 2019-01-03

## 2019-01-03 MED ORDER — LACTOBACILLUS ACID-PECTIN PO CAPS
0.50 | ORAL_CAPSULE | ORAL | Status: DC
Start: ? — End: 2019-01-03

## 2019-01-03 MED ORDER — IOHEXOL 300 MG/ML  SOLN
100.0000 mL | Freq: Once | INTRAMUSCULAR | Status: AC | PRN
  Administered 2019-01-03: 100 mL via INTRAVENOUS

## 2019-01-03 MED ORDER — SODIUM CHLORIDE 0.9 % IV SOLN
INTRAVENOUS | Status: DC
Start: ? — End: 2019-01-03

## 2019-01-03 NOTE — ED Provider Notes (Signed)
[facility] EMERGENCY DEPARTMENT Provider Note   CSN: [id number] Arrival date & time: 01/02/19  2009     History   Chief Complaint Chief Complaint  Patient presents with   Fever    HPI April Meyer is a 53 y.o. female.     The history is provided by the patient.  Fever Severity:  Moderate Onset quality:  Gradual Duration:  12 hours Timing:  Constant Progression:  Worsening Chronicity:  New Relieved by:  Acetaminophen Worsened by:  Nothing Associated symptoms: chills, headaches, nausea and vomiting   Associated symptoms: no chest pain, no cough, no diarrhea and no rash   Patient with history of ADHD, migraines, IIH presents with fever.  She reports recent VP shunt placement for IIH on 10/22 Started over 12 hours ago she began having fever and right lower quadrant abdominal pain.  She reports 1 episode of emesis.  No diarrhea.  No chest pain/cough/shortness of breath.  She reports mild headache which is similar to prior episodes.  No neck stiffness  Past Medical History:  Diagnosis Date   ADHD (attention deficit hyperactivity disorder)    Anxiety    Arnold-Chiari deformity (HCC)    AVM (arteriovenous malformation) spine 04/21/2018   Brain tumor ([place])    Cerebral venous sinus thrombosis, remote, resolved 03/28/2013   Chronic pain    Common migraine with intractable migraine 04/21/2018   EDS (Ehlers-Danlos syndrome)    Eustachian tube dysfunction    GERD (gastroesophageal reflux disease)    Hemorrhoids    HSV infection    Hypothyroidism    Migraine    Nystagmus, positional, central type 03/28/2013   Obstructive sleep apnea    Other malaise and fatigue 03/28/2013   " The patient reports feeling happy, ED, sore" on she has undergone at Chiari malformation surgery decompression in 2011. Prior to the surgery were chief complaints were weakness numbness and neck problems. She had swallowing difficulties and dizziness;  Classic Chiari  presentation. But she didn't have headaches.   Vitamin D deficiency     Patient Active Problem List   Diagnosis Date Noted   AVM (arteriovenous malformation) spine 04/21/2018   Common migraine with intractable migraine 04/21/2018   Other malaise and fatigue 03/28/2013   History of Chiari malformation 03/28/2013   Hypersomnia, persistent 03/28/2013   Cerebral venous sinus thrombosis, remote, resolved 03/28/2013   Nystagmus, positional, central type 03/28/2013   Menorrhagia 07/31/2011    Past Surgical History:  Procedure Laterality Date   ARNOLD CHIARI REPAIR     BRAIN SURGERY     BREAST BIOPSY Left    BREAST EXCISIONAL BIOPSY Right    CHOLECYSTECTOMY     DILATION AND CURETTAGE OF UTERUS     ENDOMETRIAL ABLATION  02/09/2013   HerOption   INTRAUTERINE DEVICE INSERTION  10/2010   Mirena   IUD REMOVAL  11/2010   could not tolerate hormonal side effects   neck fusion c1-c3     TONSILLECTOMY AND ADENOIDECTOMY       OB History    Gravida  2   Para  1   Term  1   Preterm      AB  1   Living  1     SAB      TAB      Ectopic  1   Multiple      Live Births               Home Medications  Prior to Admission medications   Medication Sig Start Date End Date Taking? Authorizing Provider  cyclobenzaprine (FLEXERIL) 5 MG tablet Take 5 mg by mouth 3 (three) times daily as needed for muscle spasms.    [provider]  diazepam (VALIUM) 5 MG tablet Take 1 tablet (5 mg total) by mouth every 6 (six) hours as needed for anxiety. 02/07/13   Fontaine, Belinda Block, MD  Fremanezumab-vfrm (AJOVY) 225 MG/1.5ML SOSY Inject 225 mg into the skin every 30 (thirty) days. 04/21/18   Kathrynn Ducking, MD  levothyroxine (SYNTHROID, LEVOTHROID) 125 MCG tablet Take 125 mcg by mouth daily before breakfast.    [provider]  Omeprazole Magnesium (PRILOSEC OTC PO) Take 1 capsule by mouth at bedtime.     [provider]  ondansetron (ZOFRAN)  4 MG tablet Take 1 tablet (4 mg total) by mouth every 8 (eight) hours as needed for nausea or vomiting. 08/12/18   Fontaine, Belinda Block, MD  Oxycodone HCl 10 MG TABS Take 10 mg by mouth every 4 (four) hours as needed (pain.).     [provider]  promethazine (PHENERGAN) 25 MG tablet Take 25 mg by mouth every 6 (six) hours as needed. Nausea    [provider]  valACYclovir (VALTREX) 1000 MG tablet Take 2 tabs (2 grams) po now and repeat in 12 hours prn coldsore. 05/27/18   Fontaine, Belinda Block, MD    Family History Family History  Problem Relation Age of Onset   Aortic dissection Father    Diabetes Maternal Aunt    Breast cancer Maternal Aunt        40's   Diabetes Maternal Uncle    Breast cancer Paternal Aunt 58   Diabetes Maternal Grandmother    Cancer Maternal Grandmother        SKIN AND LUNG   Diabetes Paternal Grandmother    Breast cancer Maternal Aunt        50's   Breast cancer Maternal Aunt        50's    Social History Social History   Tobacco Use   Smoking status: Former Smoker    Quit date: 09/09/2007    Years since quitting: 11.3   Smokeless tobacco: Never Used  Substance Use Topics   Alcohol use: No   Drug use: No     Allergies   Other and Prednisone   Review of Systems Review of Systems  Constitutional: Positive for chills and fever.  Respiratory: Negative for cough and shortness of breath.   Cardiovascular: Negative for chest pain.  Gastrointestinal: Positive for nausea and vomiting. Negative for diarrhea.  Musculoskeletal: Negative for neck stiffness.  Skin: Negative for rash.  Neurological: Positive for headaches.  All other systems reviewed and are negative.    Physical Exam Updated Vital Signs BP 138/77 (BP Location: Right Arm)    Pulse 97    Temp 99.5 F (37.5 C) (Oral)    Resp 17    LMP 12/30/2018    SpO2 99%   Physical Exam CONSTITUTIONAL: Well developed/well nourished HEAD: Well-healing incision noted.   Clean dry and intact.  No other signs of trauma EYES: EOMI/PERRL ENMT: Mucous membranes moist NECK: supple no meningeal signs SPINE/BACK:entire spine nontender CV: S1/S2 noted, no murmurs/rubs/gallops noted LUNGS: Lungs are clear to auscultation bilaterally, no apparent distress ABDOMEN: soft, moderate RLQ tenderness, no rebound or guarding, bowel sounds noted throughout abdomen Incision is healing well, clean dry and intact GU:no cva tenderness NEURO: Pt is awake/alert/appropriate,  moves all extremitiesx4.  No facial droop.  GCS 15 EXTREMITIES: pulses normal/equal, full ROM SKIN: warm, color normal PSYCH: no abnormalities of mood noted, alert and oriented to situation   ED Treatments / Results  Labs (all labs ordered are listed, but only abnormal results are displayed) Labs Reviewed  COMPREHENSIVE METABOLIC PANEL - Abnormal; Notable for the following components:      Result Value   Glucose, Bld 108 (*)    Calcium 8.2 (*)    Total Protein 6.2 (*)    Albumin 3.4 (*)    AST 14 (*)    All other components within normal limits  CBC WITH DIFFERENTIAL/PLATELET - Abnormal; Notable for the following components:   RBC 3.70 (*)    Hemoglobin 11.0 (*)    HCT 34.2 (*)    All other components within normal limits  URINALYSIS, ROUTINE W REFLEX MICROSCOPIC - Abnormal; Notable for the following components:   Hgb urine dipstick SMALL (*)    All other components within normal limits  LACTIC ACID, PLASMA  I-STAT BETA HCG BLOOD, ED ([facility], [facility], [facility] ONLY)    EKG None  Radiology Ct Abdomen Pelvis W Contrast  Result Date: 01/03/2019 CLINICAL DATA:  Fevers and abdominal pain, history of recent shunt catheter placement EXAM: CT ABDOMEN AND PELVIS WITH CONTRAST TECHNIQUE: Multidetector CT imaging of the abdomen and pelvis was performed using the standard protocol following bolus administration of intravenous contrast. CONTRAST:  160mL OMNIPAQUE IOHEXOL 300 MG/ML  SOLN COMPARISON:  None. FINDINGS:  Lower chest: Lung bases are free of acute infiltrate. Tiny pleural effusions are noted bilaterally. Hepatobiliary: No focal liver abnormality is seen. Status post cholecystectomy. No biliary dilatation. Pancreas: Unremarkable. No pancreatic ductal dilatation or surrounding inflammatory changes. Spleen: Normal in size without focal abnormality. Adrenals/Urinary Tract: Adrenal glands are within normal limits. Kidneys are well visualized and demonstrates a nonobstructing stone in the upper pole of the left kidney. The ureters are within normal limits. The bladder is decompressed. Stomach/Bowel: The appendix is unremarkable. No obstructive or inflammatory changes of the colon are seen. Large hiatal hernia is noted. Small bowel is unremarkable. Vascular/Lymphatic: No significant vascular findings are present. Scattered small retroperitoneal lymph nodes are seen. Reproductive: Uterus and bilateral adnexa are unremarkable. Other: Some hazy density is noted within the mesentery with some scattered small lymph nodes. These are likely reactive in nature and may be related to some mild enteritis. Shunt catheter is noted extending into the right abdomen. A small amount of free fluid is noted likely related to the shunt catheter. No other focal abnormality is noted. Musculoskeletal: No bony abnormality is noted IMPRESSION: Shunt catheter in place with small amount of free fluid within the pelvis. Nonobstructing left renal stone. Hazy density within the small bowel mesentery with some small associated lymph nodes. This may be related to an underlying enteritis. No obstructive changes are seen. Hiatal hernia. Electronically Signed   By: Inez Catalina M.D.   On: 01/03/2019 02:55    Procedures Procedures   Medications Ordered in ED Medications  sodium chloride flush (NS) 0.9 % injection 3 mL (has no administration in time range)  HYDROmorphone (DILAUDID) injection 1 mg (has no administration in time range)  sodium chloride  0.9 % bolus 1,000 mL (0 mLs Intravenous Stopped 01/03/19 0258)  HYDROmorphone (DILAUDID) injection 1 mg (1 mg Intravenous Given 01/03/19 0302)  iohexol (OMNIPAQUE) 300 MG/ML solution 100 mL (100 mLs Intravenous Contrast Given 01/03/19 0248)     Initial Impression / Assessment and  Plan / ED Course  I have reviewed the triage vital signs and the nursing notes.  Pertinent labs  results that were available during my care of the patient were reviewed by me and considered in my medical decision making (see chart for details).        1:31 AM Patient with fever up to 101, vomiting abdominal pain.  Will proceed with CT head and pelvis.  Patient also had a recent placement of VP shunt, but will need to evaluate abdomen due to pain. Patient's mental status appropriate, reports mild headache that is unchanged from prior No signs of meningitis at this time 3:04 AM CT scan negative for acute process or cause of fever Will consult her neurosurgeon for further guidance 3:11 AM Overall patient is improved. She is in no acute distress. Recheck temperature is 99 orally Awaiting callback from neurosurgery 3:26 AM Discussed with on-call neurosurgeon at [facility].  We discussed all findings and results. At this point, no further neurosurgical evaluation is required.  Patient reports continued abdominal pain, with right lower quadrant tenderness.  She is concerned this may be an ovarian issue as she has had this before.  After further discussion, we will proceed with pelvic ultrasound Patient declines Covid testing at this was offered due to recent fever 4:53 AM CT and US imaging negative Pt awake/alert She is taking PO fluids She is appropriate for d/c home  Final Clinical Impressions(s) / ED Diagnoses   Final diagnoses:  Abdominal pain  Generalized abdominal pain  Postoperative pain    ED Discharge Orders    None       Ripley Fraise, MD 01/03/19 [phone]

## 2019-01-03 NOTE — Discharge Instructions (Addendum)

## 2019-01-03 NOTE — ED Notes (Signed)
Patient transported to Ultrasound 

## 2019-01-04 DIAGNOSIS — Z23 Encounter for immunization: Secondary | ICD-10-CM | POA: Diagnosis not present

## 2019-01-04 DIAGNOSIS — G932 Benign intracranial hypertension: Secondary | ICD-10-CM | POA: Diagnosis not present

## 2019-01-04 DIAGNOSIS — Q796 Ehlers-Danlos syndrome, unspecified: Secondary | ICD-10-CM | POA: Diagnosis not present

## 2019-01-04 DIAGNOSIS — Z0100 Encounter for examination of eyes and vision without abnormal findings: Secondary | ICD-10-CM | POA: Diagnosis not present

## 2019-01-11 ENCOUNTER — Ambulatory Visit: Payer: PPO

## 2019-01-17 DIAGNOSIS — G894 Chronic pain syndrome: Secondary | ICD-10-CM | POA: Diagnosis not present

## 2019-01-17 DIAGNOSIS — M961 Postlaminectomy syndrome, not elsewhere classified: Secondary | ICD-10-CM | POA: Diagnosis not present

## 2019-01-17 DIAGNOSIS — Z79891 Long term (current) use of opiate analgesic: Secondary | ICD-10-CM | POA: Diagnosis not present

## 2019-01-18 ENCOUNTER — Ambulatory Visit: Payer: PPO

## 2019-01-19 DIAGNOSIS — G932 Benign intracranial hypertension: Secondary | ICD-10-CM | POA: Diagnosis not present

## 2019-01-19 DIAGNOSIS — R109 Unspecified abdominal pain: Secondary | ICD-10-CM | POA: Diagnosis not present

## 2019-01-19 DIAGNOSIS — K5900 Constipation, unspecified: Secondary | ICD-10-CM | POA: Diagnosis not present

## 2019-01-19 DIAGNOSIS — Z982 Presence of cerebrospinal fluid drainage device: Secondary | ICD-10-CM | POA: Diagnosis not present

## 2019-01-19 DIAGNOSIS — Z48811 Encounter for surgical aftercare following surgery on the nervous system: Secondary | ICD-10-CM | POA: Diagnosis not present

## 2019-01-19 DIAGNOSIS — Z4802 Encounter for removal of sutures: Secondary | ICD-10-CM | POA: Diagnosis not present

## 2019-01-21 ENCOUNTER — Other Ambulatory Visit: Payer: Self-pay

## 2019-01-24 ENCOUNTER — Other Ambulatory Visit: Payer: Self-pay

## 2019-01-24 ENCOUNTER — Encounter: Payer: Self-pay | Admitting: Gynecology

## 2019-01-24 ENCOUNTER — Ambulatory Visit: Payer: PPO | Admitting: Gynecology

## 2019-01-24 VITALS — BP 122/80

## 2019-01-24 DIAGNOSIS — N951 Menopausal and female climacteric states: Secondary | ICD-10-CM

## 2019-01-24 LAB — TSH: TSH: 0.4 mIU/L

## 2019-01-24 LAB — FOLLICLE STIMULATING HORMONE: FSH: 64.9 m[IU]/mL

## 2019-01-24 NOTE — Progress Notes (Signed)
    April Meyer 02-01-1966 [id number]        53 y.o.  G2P1011 presents to discuss hot flushes and sweats worsening over the last 3 to 4 months.  Still having regular light menses.  Recently had a shunt placed due to increased intracranial pressure.  History of Ehlers-Danlos syndrome.  History of AV malformation of the spine.  History of cerebral venous sinus thrombosis 2015.  Past medical history,surgical history, problem list, medications, allergies, family history and social history were all reviewed and documented in the EPIC chart.  Directed ROS with pertinent positives and negatives documented in the history of present illness/assessment and plan.  Exam: Vitals:   01/24/19 1101  BP: 122/80   General appearance:  Normal   Assessment/Plan:  53 y.o. G2P1011 with menopausal symptoms of flashes and night sweats over the past several months.  Recommend checking baseline FSH TSH noting she is on thyroid replacement.  We discussed the perimenopause and what to expect.  Given her CNS issues I feel she has a contraindication hormone replacement therapy.  We discussed OTC products as a trial as well as Effexor.  At this point the patient is comfortable trying OTC products.  Will call if she wants a trial of Effexor.  15 minutes face-to-face as well as record review was spent addressing her menopausal symptoms.  Anastasio Auerbach MD, 11:17 AM 01/24/2019

## 2019-01-24 NOTE — Patient Instructions (Signed)
Try over-the-counter products for the menopausal symptoms as we discussed such as Estroven.  Call if you would like a trial of Effexor which is an antidepressant that may benefit menopausal symptoms.

## 2019-01-25 ENCOUNTER — Encounter: Payer: Self-pay | Admitting: Gynecology

## 2019-02-14 DIAGNOSIS — G894 Chronic pain syndrome: Secondary | ICD-10-CM | POA: Diagnosis not present

## 2019-02-14 DIAGNOSIS — M961 Postlaminectomy syndrome, not elsewhere classified: Secondary | ICD-10-CM | POA: Diagnosis not present

## 2019-02-14 DIAGNOSIS — Z79891 Long term (current) use of opiate analgesic: Secondary | ICD-10-CM | POA: Diagnosis not present

## 2019-02-16 DIAGNOSIS — M25512 Pain in left shoulder: Secondary | ICD-10-CM | POA: Diagnosis not present

## 2019-02-18 DIAGNOSIS — M25512 Pain in left shoulder: Secondary | ICD-10-CM | POA: Diagnosis not present

## 2019-02-28 DIAGNOSIS — L02214 Cutaneous abscess of groin: Secondary | ICD-10-CM | POA: Diagnosis not present

## 2019-02-28 DIAGNOSIS — G932 Benign intracranial hypertension: Secondary | ICD-10-CM | POA: Diagnosis not present

## 2019-02-28 DIAGNOSIS — Q796 Ehlers-Danlos syndrome, unspecified: Secondary | ICD-10-CM | POA: Diagnosis not present

## 2019-02-28 DIAGNOSIS — Z0100 Encounter for examination of eyes and vision without abnormal findings: Secondary | ICD-10-CM | POA: Diagnosis not present

## 2019-03-09 DIAGNOSIS — M25512 Pain in left shoulder: Secondary | ICD-10-CM | POA: Diagnosis not present

## 2019-03-14 DIAGNOSIS — M25512 Pain in left shoulder: Secondary | ICD-10-CM | POA: Diagnosis not present

## 2019-03-15 ENCOUNTER — Telehealth: Payer: Self-pay | Admitting: Neurology

## 2019-03-15 ENCOUNTER — Other Ambulatory Visit: Payer: Self-pay

## 2019-03-15 ENCOUNTER — Ambulatory Visit
Admission: RE | Admit: 2019-03-15 | Discharge: 2019-03-15 | Disposition: A | Discharge status: Home / Self Care | Payer: PPO | Source: Ambulatory Visit | Attending: Gynecology | Admitting: Gynecology

## 2019-03-15 DIAGNOSIS — Z1231 Encounter for screening mammogram for malignant neoplasm of breast: Secondary | ICD-10-CM

## 2019-03-15 NOTE — Telephone Encounter (Signed)
PA completed through cover my meds.  [id number] Will wait for response

## 2019-03-16 NOTE — Telephone Encounter (Signed)
PA approved through [employer]. Approved through 03/09/2020

## 2019-03-21 DIAGNOSIS — M25512 Pain in left shoulder: Secondary | ICD-10-CM | POA: Diagnosis not present

## 2019-03-28 DIAGNOSIS — M25512 Pain in left shoulder: Secondary | ICD-10-CM | POA: Diagnosis not present

## 2019-04-11 DIAGNOSIS — Z79891 Long term (current) use of opiate analgesic: Secondary | ICD-10-CM | POA: Diagnosis not present

## 2019-04-11 DIAGNOSIS — G894 Chronic pain syndrome: Secondary | ICD-10-CM | POA: Diagnosis not present

## 2019-04-11 DIAGNOSIS — M25512 Pain in left shoulder: Secondary | ICD-10-CM | POA: Diagnosis not present

## 2019-04-11 DIAGNOSIS — M961 Postlaminectomy syndrome, not elsewhere classified: Secondary | ICD-10-CM | POA: Diagnosis not present

## 2019-04-15 DIAGNOSIS — Q796 Ehlers-Danlos syndrome, unspecified: Secondary | ICD-10-CM | POA: Diagnosis not present

## 2019-04-15 DIAGNOSIS — Z20822 Contact with and (suspected) exposure to covid-19: Secondary | ICD-10-CM | POA: Diagnosis not present

## 2019-04-15 DIAGNOSIS — G932 Benign intracranial hypertension: Secondary | ICD-10-CM | POA: Diagnosis not present

## 2019-04-15 DIAGNOSIS — L02214 Cutaneous abscess of groin: Secondary | ICD-10-CM | POA: Diagnosis not present

## 2019-04-22 DIAGNOSIS — Z4541 Encounter for adjustment and management of cerebrospinal fluid drainage device: Secondary | ICD-10-CM | POA: Diagnosis not present

## 2019-04-22 DIAGNOSIS — Z9889 Other specified postprocedural states: Secondary | ICD-10-CM | POA: Diagnosis not present

## 2019-04-22 DIAGNOSIS — G932 Benign intracranial hypertension: Secondary | ICD-10-CM | POA: Diagnosis not present

## 2019-05-23 ENCOUNTER — Ambulatory Visit: Payer: PPO | Admitting: Neurology

## 2019-05-23 ENCOUNTER — Ambulatory Visit: Payer: PPO | Attending: Internal Medicine

## 2019-05-23 ENCOUNTER — Other Ambulatory Visit: Payer: Self-pay

## 2019-05-23 ENCOUNTER — Encounter: Payer: Self-pay | Admitting: Neurology

## 2019-05-23 VITALS — BP 120/76 | HR 63 | Temp 98.6°F | Ht 66.0 in | Wt 230.0 lb

## 2019-05-23 DIAGNOSIS — G43019 Migraine without aura, intractable, without status migrainosus: Secondary | ICD-10-CM | POA: Diagnosis not present

## 2019-05-23 DIAGNOSIS — Z23 Encounter for immunization: Secondary | ICD-10-CM

## 2019-05-23 MED ORDER — AIMOVIG 140 MG/ML ~~LOC~~ SOAJ: 140.0000 mg | SUBCUTANEOUS | 4 refills | Status: DC

## 2019-05-23 MED ORDER — FLUOXETINE HCL 10 MG PO CAPS: ORAL_CAPSULE | ORAL | 3 refills | Status: DC

## 2019-05-23 NOTE — Progress Notes (Signed)
   Covid-19 Vaccination Clinic  Name:  ASHLIEGH FACEY    MRN: [id number] DOB: 09-Sep-1965  05/23/2019  Ms. Maccarone was observed post Covid-19 immunization for 15 minutes without incident. She was provided with Vaccine Information Sheet and instruction to access the V-Safe system.   Ms. Allison was instructed to call 911 with any severe reactions post vaccine: Marland Kitchen Difficulty breathing  . Swelling of face and throat  . A fast heartbeat  . A bad rash all over body  . Dizziness and weakness   Immunizations Administered    Name Date Dose VIS Date Route   Pfizer COVID-19 Vaccine 05/23/2019  1:40 PM 0.3 mL 02/18/2019 Intramuscular   Manufacturer: [place]   Lot: [id number]   [place]: [id number]

## 2019-05-23 NOTE — Patient Instructions (Signed)
We will start Foreman for the headache. Start Prozac 10 mg for the tongue movements.

## 2019-05-23 NOTE — Progress Notes (Signed)
Reason for visit: Migraine headache, Ehlers-Danlos syndrome, spinal cord AVM  April Meyer is an 54 y.o. female  History of present illness:  April Meyer is a 53 year old right-handed white female with a history of Ehlers-Danlos syndrome and spinal cord AVM that is being followed conservatively.  The patient has not had any new numbness or weakness of the extremities.  She did have a VP shunt placed on 30 December 2018 for elevated intracranial pressures.  The patient has had a suboccipital craniectomy previously.  There is some question of a low-grade glioma affecting the left thalamic area.  The patient was having severe headaches prior to the VP shunt, the shunt has resulted in good improvement of the headaches, she is only having about 2 per week now, her headaches continue to be activated by weather changes.  She indicates that she has had some tongue protrusions for several years but this seems to be getting worse, she has not been on any antipsychotic medications previously.  The patient has had abdominal pain since the VP shunt was placed.  The patient takes Diamox intermittently, in the past she did not tolerate daily Diamox or Topamax.  She has been on multiple medications for migraines previously including Topamax, propranolol, amitriptyline, Depakote, and Ajovy.  The Ajovy was tried prior to the VP shunt placement when the headaches were quite severe.  She returns for an evaluation.  Past Medical History:  Diagnosis Date  . ADHD (attention deficit hyperactivity disorder)   . Anxiety   . Arnold-Chiari deformity ([place])   . AVM (arteriovenous malformation) spine 04/21/2018  . Brain tumor ([place])   . Cerebral venous sinus thrombosis, remote, resolved 03/28/2013  . Chronic pain   . Common migraine with intractable migraine 04/21/2018  . EDS (Ehlers-Danlos syndrome)   . Eustachian tube dysfunction   . GERD (gastroesophageal reflux disease)   . Hemorrhoids   . HSV infection   . Hypothyroidism     . Migraine   . Nystagmus, positional, central type 03/28/2013  . Obstructive sleep apnea   . Other malaise and fatigue 03/28/2013   " The patient reports feeling happy, ED, sore" on she has undergone at Chiari malformation surgery decompression in 2011. Prior to the surgery were chief complaints were weakness numbness and neck problems. She had swallowing difficulties and dizziness;  Classic Chiari presentation. But she didn't have headaches.  . Vitamin D deficiency     Past Surgical History:  Procedure Laterality Date  . ARNOLD CHIARI REPAIR    . BRAIN SURGERY    . BREAST BIOPSY Left   . BREAST EXCISIONAL BIOPSY Right   . CHOLECYSTECTOMY    . DILATION AND CURETTAGE OF UTERUS    . ENDOMETRIAL ABLATION  02/09/2013   HerOption  . INTRAUTERINE DEVICE INSERTION  10/2010   Mirena  . IUD REMOVAL  11/2010   could not tolerate hormonal side effects  . neck fusion c1-c3    . TONSILLECTOMY AND ADENOIDECTOMY      Family History  Problem Relation Age of Onset  . Aortic dissection Father   . Diabetes Maternal Aunt   . Breast cancer Maternal Aunt        40's  . Diabetes Maternal Uncle   . Breast cancer Paternal Aunt 58  . Diabetes Maternal Grandmother   . Cancer Maternal Grandmother        SKIN AND LUNG  . Diabetes Paternal Grandmother   . Breast cancer Maternal Aunt  6's  . Breast cancer Maternal Aunt        50's    Social history:  reports that she quit smoking about 11 years ago. She has never used smokeless tobacco. She reports that she does not drink alcohol or use drugs.    Allergies  Allergen Reactions  . Other Anaphylaxis    Steroids and Zucchini   . Prednisone Anaphylaxis  . Morphine And Related Itching    Severe    Medications:  Prior to Admission medications   Medication Sig Start Date End Date Taking? Authorizing Provider  albuterol (VENTOLIN HFA) 108 (90 Base) MCG/ACT inhaler Inhale 1-2 puffs into the lungs every 4 (four) hours as needed for wheezing or  shortness of breath.   Yes [provider]  ALPRAZolam Duanne Moron) 0.5 MG tablet Take 0.5 mg by mouth 3 (three) times daily as needed for anxiety. 12/31/18  Yes [provider]  cyclobenzaprine (FLEXERIL) 5 MG tablet Take 5 mg by mouth 3 (three) times daily as needed for muscle spasms.   Yes [provider]  dexlansoprazole (DEXILANT) 60 MG capsule Take 60 mg by mouth at bedtime.   Yes [provider]  diazepam (VALIUM) 5 MG tablet Take 1 tablet (5 mg total) by mouth every 6 (six) hours as needed for anxiety. 02/07/13  Yes Fontaine, Belinda Block, MD  levothyroxine (SYNTHROID, LEVOTHROID) 125 MCG tablet Take 125-250 mcg by mouth See admin instructions. 250 mcg on Sat and Sun; 125 mcg Mon thru Fri   Yes [provider]  ondansetron (ZOFRAN) 4 MG tablet Take 1 tablet (4 mg total) by mouth every 8 (eight) hours as needed for nausea or vomiting. 08/12/18  Yes Fontaine, Belinda Block, MD  Oxycodone HCl 10 MG TABS Take 10 mg by mouth every 4 (four) hours as needed (pain.).    Yes [provider]  promethazine (PHENERGAN) 25 MG tablet Take 25 mg by mouth every 6 (six) hours as needed. Nausea   Yes [provider]  valACYclovir (VALTREX) 1000 MG tablet Take 2 tabs (2 grams) po now and repeat in 12 hours prn coldsore. 05/27/18  Yes Fontaine, Belinda Block, MD  Vitamin D, Ergocalciferol, (DRISDOL) 1.25 MG (50000 UT) CAPS capsule Take 50,000 Units by mouth every Wednesday.   Yes [provider]    ROS:  Out of a complete 14 system review of symptoms, the patient complains only of the following symptoms, and all other reviewed systems are negative.  Headache Tongue movements Abdominal pain  Blood pressure 120/76, pulse 63, temperature 98.6 F (37 C), height 5\' 6"  (1.676 m), weight 230 lb (104.3 kg).  Physical Exam  General: The patient is alert and cooperative at the time of the examination.  The patient is moderately obese.  Skin: No significant  peripheral edema is noted.   Neurologic Exam  Mental status: The patient is alert and oriented x 3 at the time of the examination. The patient has apparent normal recent and remote memory, with an apparently normal attention span and concentration ability.   Cranial nerves: Facial symmetry is present. Speech is normal, no aphasia or dysarthria is noted. Extraocular movements are full. Visual fields are full.  Motor: The patient has good strength in all 4 extremities.  Sensory examination: Soft touch sensation is symmetric on the face, arms, and legs.  Coordination: The patient has good finger-nose-finger and heel-to-shin bilaterally.  Gait and station: The patient has a normal gait. Tandem gait is minimally unsteady. Romberg is negative.  No drift is seen.  Reflexes: Deep tendon reflexes are symmetric.   Assessment/Plan:  1.  Intractable migraine headache  2.  Ehlers-Danlos syndrome  3.  Status post VP shunt  4.  Spinal cord AVM  The patient will be given a trial on Aimovig for the migraine.  She reports some protrusions of the tongue, she has not been on any medications per se that should induce tardive dyskinesia.  This may be more of a motor tic issue, the patient will be placed on low-dose Prozac starting at 10 mg daily for 2 weeks and then go to 20 mg daily.  She will follow-up here in 4 months.  Jill Alexanders MD 05/23/2019 8:28 AM  [facility] [address] [place], [place] [address]  Phone [phone] Fax [phone]

## 2019-05-26 ENCOUNTER — Telehealth: Payer: Self-pay

## 2019-05-26 NOTE — Telephone Encounter (Signed)
PA done on cover my meds for aimovig pending. [employer] has received your information, and the request will be reviewed. You may close this dialog, return to your dashboard, and perform other tasks. You will receive an electronic determination in CoverMyMeds. You can see the latest determination by locating this request on your dashboard or by reopening this request. You will also receive a faxed copy of the determination. If you have any questions please contact [employer] at [phone]. If you need assistance, please chat with CoverMyMeds or call us at [phone].

## 2019-05-31 NOTE — Telephone Encounter (Signed)
Aimovig PA approve from 05/26/2019 to 03/09/2020. [employer] contact number is [phone].

## 2019-06-01 ENCOUNTER — Encounter: Payer: PPO | Admitting: Obstetrics and Gynecology

## 2019-06-09 DIAGNOSIS — M961 Postlaminectomy syndrome, not elsewhere classified: Secondary | ICD-10-CM | POA: Diagnosis not present

## 2019-06-09 DIAGNOSIS — Z79891 Long term (current) use of opiate analgesic: Secondary | ICD-10-CM | POA: Diagnosis not present

## 2019-06-09 DIAGNOSIS — G894 Chronic pain syndrome: Secondary | ICD-10-CM | POA: Diagnosis not present

## 2019-06-15 ENCOUNTER — Ambulatory Visit: Payer: PPO | Attending: Internal Medicine

## 2019-06-15 DIAGNOSIS — Z23 Encounter for immunization: Secondary | ICD-10-CM

## 2019-06-15 NOTE — Progress Notes (Signed)
   Covid-19 Vaccination Clinic  Name:  MARK SCHAFFTER    MRN: [id number] DOB: 01/08/1966  06/15/2019  Ms. Dandrea was observed post Covid-19 immunization for 30 minutes based on pre-vaccination screening without incident. She was provided with Vaccine Information Sheet and instruction to access the V-Safe system.   Ms. Dershem was instructed to call 911 with any severe reactions post vaccine: Marland Kitchen Difficulty breathing  . Swelling of face and throat  . A fast heartbeat  . A bad rash all over body  . Dizziness and weakness   Immunizations Administered    Name Date Dose VIS Date Route   Pfizer COVID-19 Vaccine 06/15/2019  1:09 PM 0.3 mL 02/18/2019 Intramuscular   Manufacturer: [place]   Lot: [id number]   [place]: [id number]

## 2019-07-19 ENCOUNTER — Encounter: Payer: PPO | Admitting: Obstetrics and Gynecology

## 2019-07-27 ENCOUNTER — Other Ambulatory Visit: Payer: Self-pay

## 2019-07-28 ENCOUNTER — Ambulatory Visit (INDEPENDENT_AMBULATORY_CARE_PROVIDER_SITE_OTHER): Payer: PPO | Admitting: Obstetrics and Gynecology

## 2019-07-28 ENCOUNTER — Encounter: Payer: Self-pay | Admitting: Obstetrics and Gynecology

## 2019-07-28 VITALS — BP 118/76 | Ht 67.0 in | Wt 224.0 lb

## 2019-07-28 DIAGNOSIS — G43829 Menstrual migraine, not intractable, without status migrainosus: Secondary | ICD-10-CM

## 2019-07-28 DIAGNOSIS — N951 Menopausal and female climacteric states: Secondary | ICD-10-CM | POA: Diagnosis not present

## 2019-07-28 DIAGNOSIS — Z9289 Personal history of other medical treatment: Secondary | ICD-10-CM

## 2019-07-28 DIAGNOSIS — Z01419 Encounter for gynecological examination (general) (routine) without abnormal findings: Secondary | ICD-10-CM | POA: Diagnosis not present

## 2019-07-28 DIAGNOSIS — A6000 Herpesviral infection of urogenital system, unspecified: Secondary | ICD-10-CM | POA: Diagnosis not present

## 2019-07-28 NOTE — Progress Notes (Signed)
April Meyer May 21, 1965 [id number]  SUBJECTIVE:  54 y.o. G2P1011 female for annual routine gynecologic exam. She has no gynecologic concerns. LMP 2 weeks ago.  She reports still having regular monthly menses, sometimes a little heavier with bright red bleeding, menstrual duration has been stable.  She does have a history of an AV malformation in the spine, cerebral venous sinus thrombosis in 2015, Ehlers-Danlos syndrome, and recently had a shunt placed due to increased intracranial pressure.  She does have headaches from the pressure, she also notices that her headaches at times get worse with the hormone fluctation of her menstrual cycle.  Current Outpatient Medications  Medication Sig Dispense Refill  . albuterol (VENTOLIN HFA) 108 (90 Base) MCG/ACT inhaler Inhale 1-2 puffs into the lungs every 4 (four) hours as needed for wheezing or shortness of breath.    . cyclobenzaprine (FLEXERIL) 5 MG tablet Take 5 mg by mouth 3 (three) times daily as needed for muscle spasms.    Marland Kitchen dexlansoprazole (DEXILANT) 60 MG capsule Take 60 mg by mouth at bedtime.    . diazepam (VALIUM) 5 MG tablet Take 1 tablet (5 mg total) by mouth every 6 (six) hours as needed for anxiety. 15 tablet 0  . levothyroxine (SYNTHROID, LEVOTHROID) 125 MCG tablet Take 125-250 mcg by mouth See admin instructions. 250 mcg on Sat and Sun; 125 mcg Mon thru Fri    . ondansetron (ZOFRAN) 4 MG tablet Take 1 tablet (4 mg total) by mouth every 8 (eight) hours as needed for nausea or vomiting. 30 tablet 0  . Oxycodone HCl 10 MG TABS Take 10 mg by mouth every 4 (four) hours as needed (pain.).     Marland Kitchen promethazine (PHENERGAN) 25 MG tablet Take 25 mg by mouth every 6 (six) hours as needed. Nausea    . valACYclovir (VALTREX) 1000 MG tablet Take 2 tabs (2 grams) po now and repeat in 12 hours prn coldsore. 20 tablet 1  . Vitamin D, Ergocalciferol, (DRISDOL) 1.25 MG (50000 UT) CAPS capsule Take 50,000 Units by mouth every Wednesday.    [name]  (AIMOVIG) 140 MG/ML SOAJ Inject 140 mg into the skin every 30 (thirty) days. (Patient not taking: Reported on 07/28/2019) 1.12 mg 4  . FLUoxetine (PROZAC) 10 MG capsule One daily for 2 weeks, then 2 daily (Patient not taking: Reported on 07/28/2019) 60 capsule 3   No current facility-administered medications for this visit.   Allergies: Other, Prednisone, and Morphine and related  No LMP recorded. Patient has had an ablation.  Past medical history,surgical history, problem list, medications, allergies, family history and social history were all reviewed and documented as reviewed in the EPIC chart.  ROS:  Feeling well. No dyspnea or chest pain on exertion.  No abdominal pain, change in bowel habits, black or bloody stools.  No urinary tract symptoms. GYN ROS: normal menses, no abnormal bleeding, pelvic pain or discharge, no breast pain or new or enlarging lumps on self exam. No neurological complaints.   OBJECTIVE:  BP 118/76   Ht 5\' 7"  (1.702 m)   Wt 224 lb (101.6 kg)   BMI 35.08 kg/m  The patient appears well, alert, oriented x 3, in no distress. ENT normal.  Neck supple. No cervical or supraclavicular adenopathy or thyromegaly.  Lungs are clear, good air entry, no wheezes, rhonchi or rales. S1 and S2 normal, no murmurs, regular rate and rhythm.  Abdomen soft without tenderness, guarding, mass or organomegaly.  Neurological is normal, no focal findings.  BREAST EXAM: breasts appear normal, no suspicious masses, no skin or nipple changes or axillary nodes  PELVIC EXAM: VULVA: normal appearing vulva with no masses, tenderness or lesions, VAGINA: normal appearing vagina with normal color and discharge, no lesions, CERVIX: normal appearing cervix without discharge or lesions, UTERUS: uterus is normal size, shape, consistency and nontender, ADNEXA: normal adnexa in size, nontender and no masses  Chaperone: [name] present during the examination  ASSESSMENT:  54 y.o. G2P1011 here for  annual gynecologic exam  PLAN:   1. Perimenopause leading to postmenopause. Prior endometrial ablation.  Still having regular menses and some vasomotor symptoms.  01/24/2019 TSH was normal, FSH at that time was in the menopausal range at 64.9.  Being that she has not concluded menstruation for 1 year, she is not yet considered menopausal by clinical criteria.  Discussed component of menstrual migraines.  With history of thrombosis she would not be a good candidate for hormonal contraception to modulate headaches.  If getting really bothersome would recommend conferring with neurology/headache specialist. 2. Pap smear/HPV 2018.  No significant history of abnormal Pap smears.  Next Pap smear due 2023 following the current guidelines recommending the 5 year cotesting interval. 3. Mammogram 03/2019.  Normal breast exam today.  Continue annual mammograms. 4. Colonoscopy 2018.  Recommended that she follow up at the recommended interval.  5. Herpes labialis and genitalis.  Uses Valtrex 2 g at earliest onset with repeat at 12 hours which seems to work for both areas.  Will call if needs refill. 6. Health maintenance.  No labs today as she normally has these completed swear.  Return annually or sooner, prn.  Joseph Pierini MD 07/28/19

## 2019-08-04 DIAGNOSIS — M25531 Pain in right wrist: Secondary | ICD-10-CM | POA: Diagnosis not present

## 2019-08-04 DIAGNOSIS — M25561 Pain in right knee: Secondary | ICD-10-CM | POA: Diagnosis not present

## 2019-08-12 DIAGNOSIS — M961 Postlaminectomy syndrome, not elsewhere classified: Secondary | ICD-10-CM | POA: Diagnosis not present

## 2019-08-12 DIAGNOSIS — M62830 Muscle spasm of back: Secondary | ICD-10-CM | POA: Diagnosis not present

## 2019-08-12 DIAGNOSIS — Z79891 Long term (current) use of opiate analgesic: Secondary | ICD-10-CM | POA: Diagnosis not present

## 2019-08-12 DIAGNOSIS — G894 Chronic pain syndrome: Secondary | ICD-10-CM | POA: Diagnosis not present

## 2019-08-12 DIAGNOSIS — M25531 Pain in right wrist: Secondary | ICD-10-CM | POA: Diagnosis not present

## 2019-08-12 DIAGNOSIS — M25561 Pain in right knee: Secondary | ICD-10-CM | POA: Diagnosis not present

## 2019-08-18 DIAGNOSIS — F4320 Adjustment disorder, unspecified: Secondary | ICD-10-CM | POA: Diagnosis not present

## 2019-08-18 DIAGNOSIS — Q796 Ehlers-Danlos syndrome, unspecified: Secondary | ICD-10-CM | POA: Diagnosis not present

## 2019-08-18 DIAGNOSIS — G932 Benign intracranial hypertension: Secondary | ICD-10-CM | POA: Diagnosis not present

## 2019-08-18 DIAGNOSIS — L02214 Cutaneous abscess of groin: Secondary | ICD-10-CM | POA: Diagnosis not present

## 2019-08-20 DIAGNOSIS — M25531 Pain in right wrist: Secondary | ICD-10-CM | POA: Diagnosis not present

## 2019-08-20 DIAGNOSIS — M25561 Pain in right knee: Secondary | ICD-10-CM | POA: Diagnosis not present

## 2019-08-23 DIAGNOSIS — L02214 Cutaneous abscess of groin: Secondary | ICD-10-CM | POA: Diagnosis not present

## 2019-08-23 DIAGNOSIS — Q796 Ehlers-Danlos syndrome, unspecified: Secondary | ICD-10-CM | POA: Diagnosis not present

## 2019-08-23 DIAGNOSIS — E785 Hyperlipidemia, unspecified: Secondary | ICD-10-CM | POA: Diagnosis not present

## 2019-08-23 DIAGNOSIS — R7301 Impaired fasting glucose: Secondary | ICD-10-CM | POA: Diagnosis not present

## 2019-08-23 DIAGNOSIS — I10 Essential (primary) hypertension: Secondary | ICD-10-CM | POA: Diagnosis not present

## 2019-08-23 DIAGNOSIS — F39 Unspecified mood [affective] disorder: Secondary | ICD-10-CM | POA: Diagnosis not present

## 2019-08-23 DIAGNOSIS — Z0000 Encounter for general adult medical examination without abnormal findings: Secondary | ICD-10-CM | POA: Diagnosis not present

## 2019-08-23 DIAGNOSIS — E559 Vitamin D deficiency, unspecified: Secondary | ICD-10-CM | POA: Diagnosis not present

## 2019-09-19 ENCOUNTER — Telehealth: Payer: Self-pay

## 2019-09-19 NOTE — Telephone Encounter (Signed)
Pt called office and left a VM asking for a call to r/s her appt. Please call.

## 2019-09-19 NOTE — Telephone Encounter (Signed)
Appt made

## 2019-09-22 ENCOUNTER — Ambulatory Visit: Payer: PPO | Admitting: Neurology

## 2019-10-10 ENCOUNTER — Ambulatory Visit: Payer: PPO | Admitting: Neurology

## 2019-10-10 DIAGNOSIS — F39 Unspecified mood [affective] disorder: Secondary | ICD-10-CM | POA: Diagnosis not present

## 2019-10-10 DIAGNOSIS — M961 Postlaminectomy syndrome, not elsewhere classified: Secondary | ICD-10-CM | POA: Diagnosis not present

## 2019-10-10 DIAGNOSIS — G894 Chronic pain syndrome: Secondary | ICD-10-CM | POA: Diagnosis not present

## 2019-10-10 DIAGNOSIS — M62830 Muscle spasm of back: Secondary | ICD-10-CM | POA: Diagnosis not present

## 2019-10-10 DIAGNOSIS — N951 Menopausal and female climacteric states: Secondary | ICD-10-CM | POA: Diagnosis not present

## 2019-10-10 DIAGNOSIS — Z79891 Long term (current) use of opiate analgesic: Secondary | ICD-10-CM | POA: Diagnosis not present

## 2019-10-10 DIAGNOSIS — Q796 Ehlers-Danlos syndrome, unspecified: Secondary | ICD-10-CM | POA: Diagnosis not present

## 2019-10-10 DIAGNOSIS — E039 Hypothyroidism, unspecified: Secondary | ICD-10-CM | POA: Diagnosis not present

## 2019-10-10 DIAGNOSIS — L02214 Cutaneous abscess of groin: Secondary | ICD-10-CM | POA: Diagnosis not present

## 2019-11-16 DIAGNOSIS — K219 Gastro-esophageal reflux disease without esophagitis: Secondary | ICD-10-CM | POA: Diagnosis not present

## 2019-11-28 DIAGNOSIS — Z20822 Contact with and (suspected) exposure to covid-19: Secondary | ICD-10-CM | POA: Diagnosis not present

## 2019-12-05 DIAGNOSIS — M961 Postlaminectomy syndrome, not elsewhere classified: Secondary | ICD-10-CM | POA: Diagnosis not present

## 2019-12-05 DIAGNOSIS — Z79891 Long term (current) use of opiate analgesic: Secondary | ICD-10-CM | POA: Diagnosis not present

## 2019-12-05 DIAGNOSIS — M62830 Muscle spasm of back: Secondary | ICD-10-CM | POA: Diagnosis not present

## 2019-12-05 DIAGNOSIS — G894 Chronic pain syndrome: Secondary | ICD-10-CM | POA: Diagnosis not present

## 2019-12-07 DIAGNOSIS — L821 Other seborrheic keratosis: Secondary | ICD-10-CM | POA: Diagnosis not present

## 2019-12-07 DIAGNOSIS — L820 Inflamed seborrheic keratosis: Secondary | ICD-10-CM | POA: Diagnosis not present

## 2019-12-07 DIAGNOSIS — Z85828 Personal history of other malignant neoplasm of skin: Secondary | ICD-10-CM | POA: Diagnosis not present

## 2019-12-07 DIAGNOSIS — L812 Freckles: Secondary | ICD-10-CM | POA: Diagnosis not present

## 2019-12-14 IMAGING — MR MR THORACIC SPINE WO/W CM
6 of 16 series · 19 of 48 positions shown · IV contrast (multihance)
Comparison: 06/26/2017 MRI of the cervical spine and of the head.

CLINICAL DATA: 51 y/o F; bilateral leg pain and weakness for 2
weeks. Bladder incontinence for 2 weeks. History of Chiari surgery.

EXAM:
MRI THORACIC AND LUMBAR SPINE WITHOUT AND WITH CONTRAST
TECHNIQUE: Multiplanar and multiecho pulse sequences of the thoracic and lumbar
spine were obtained without and with intravenous contrast.
CONTRAST:  10 cc MultiHance.

[Series 17: T1 · sagittal · 3.0mm · 0.74mm/px · 2 of 15 slices shown (1 of 2)]
[im 1/15]
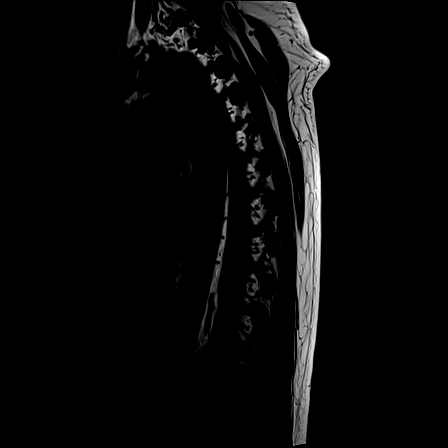
[im 15/15]
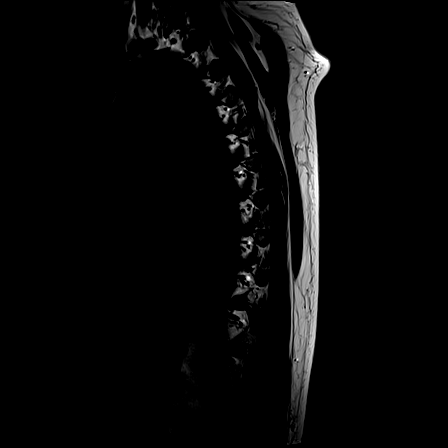

[Series 19: T2 · axial · 4.0mm · 0.28mm/px · z∈[-272,-74]mm · 4 of 33 slices shown (1 of 2)]
[im 1/33]
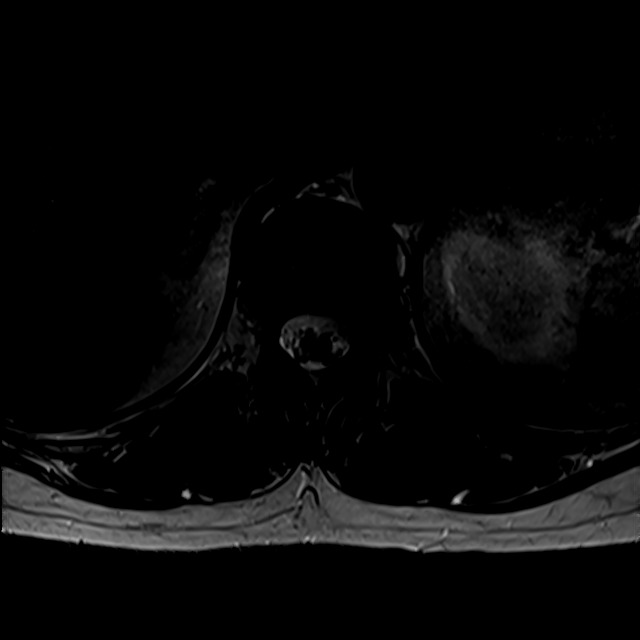
[im 11/33]
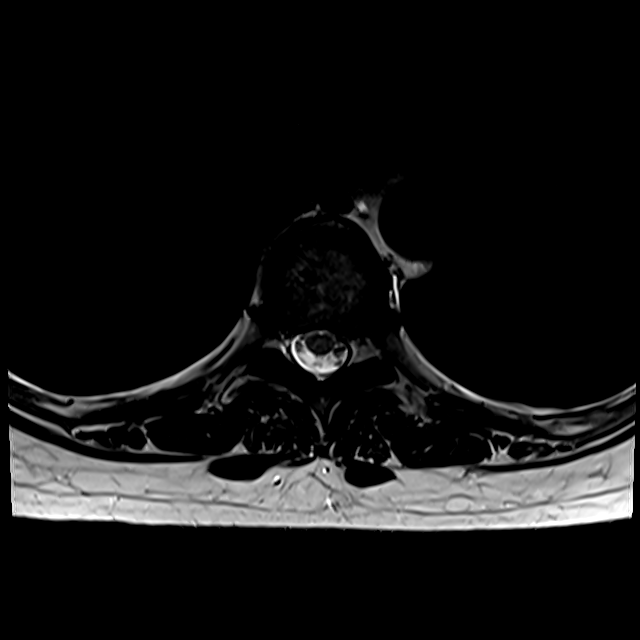
[im 22/33]
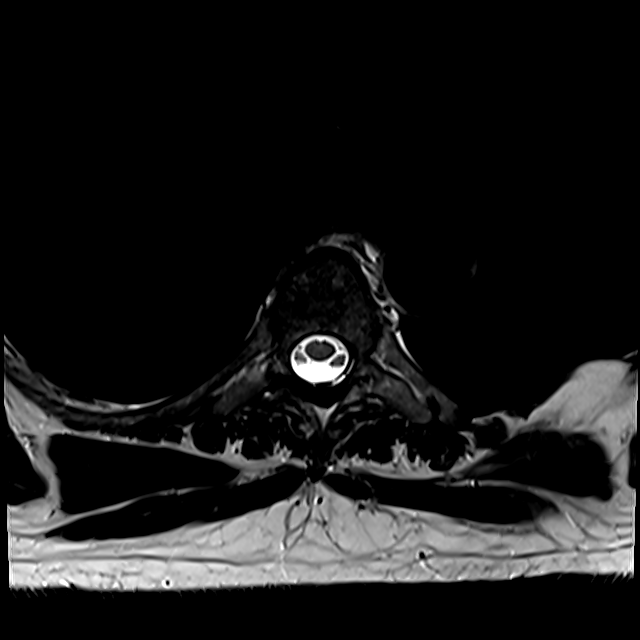
[im 33/33]
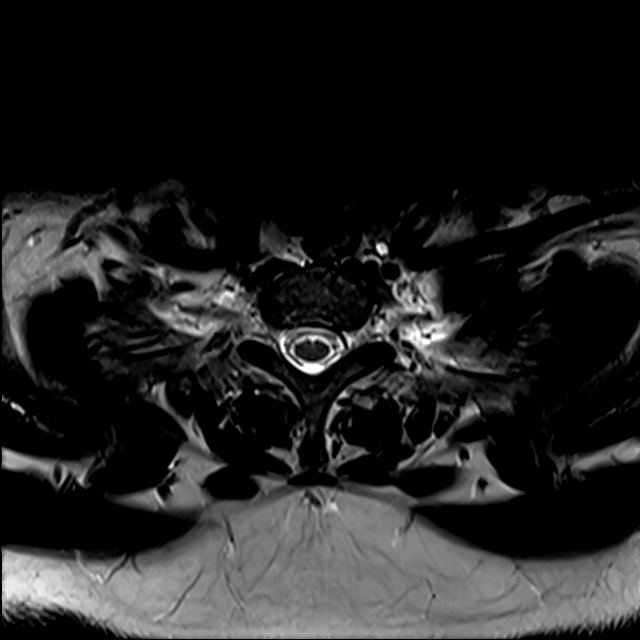

[Series 21: T1 · axial · non-contrast · 4.0mm · 0.56mm/px · z∈[-272,-74]mm · 4 of 33 slices shown (2 of 2)]
[im 1/33]
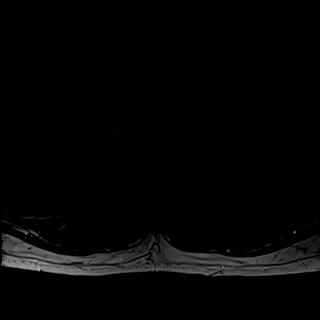
[im 11/33]
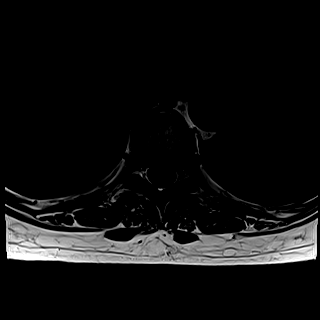
[im 22/33]
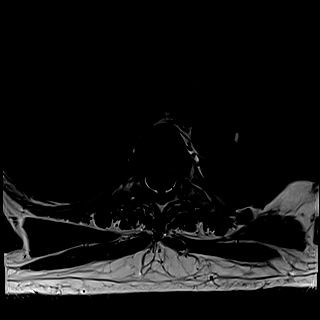
[im 33/33]
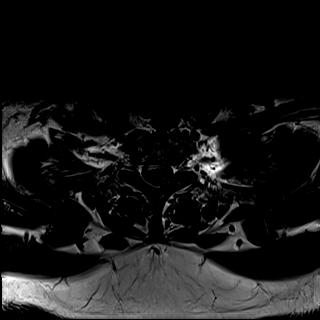

[Series 29: T2 · axial · 4.0mm · 0.28mm/px · z∈[-513,-290]mm · 5 of 41 slices shown (2 of 2)]
[im 1/41]
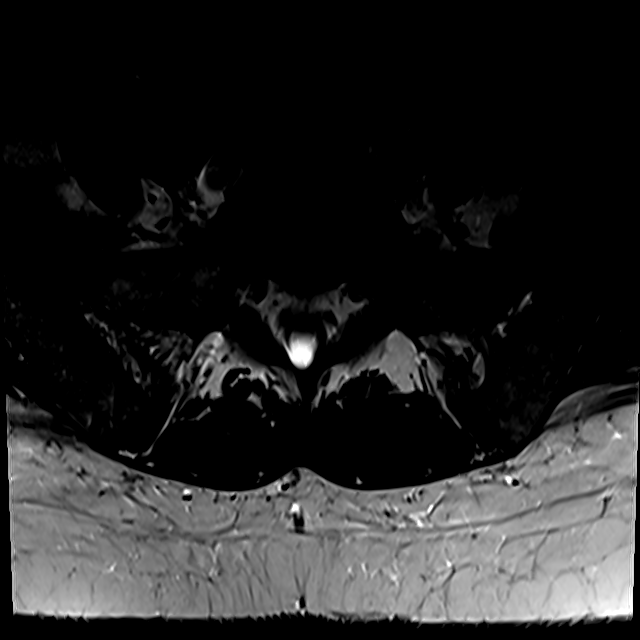
[im 11/41]
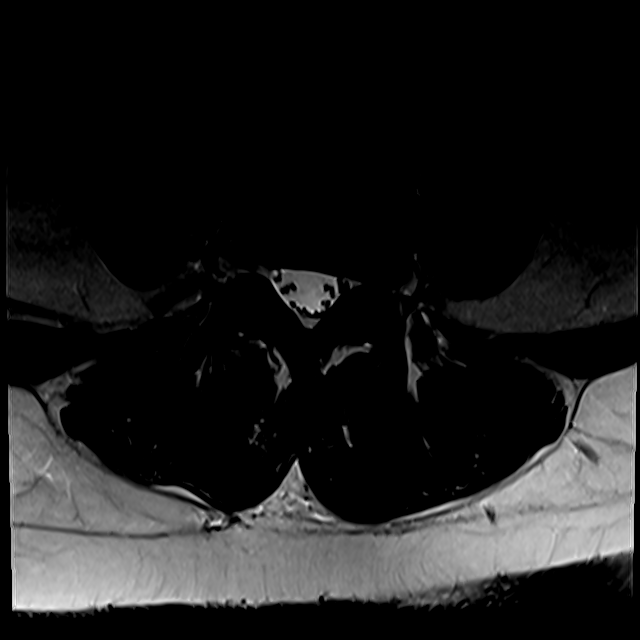
[im 21/41]
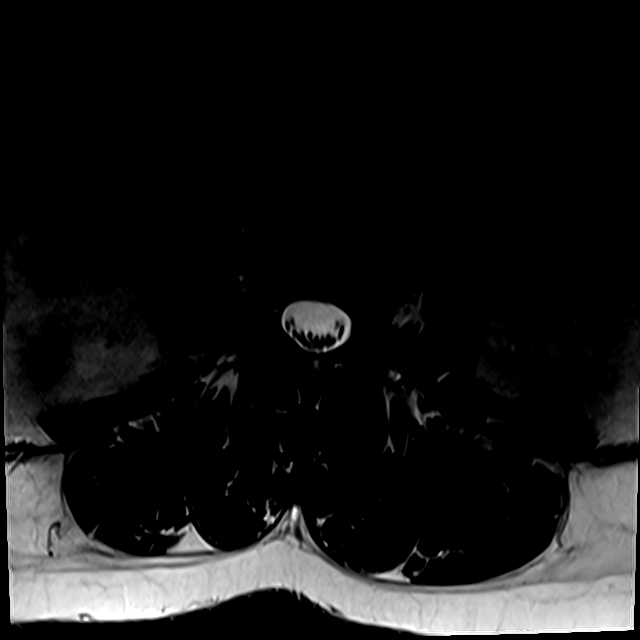
[im 31/41]
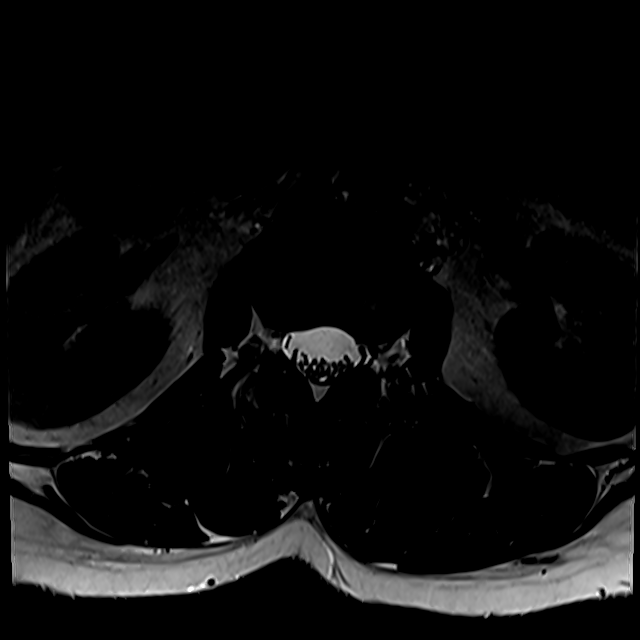
[im 41/41]
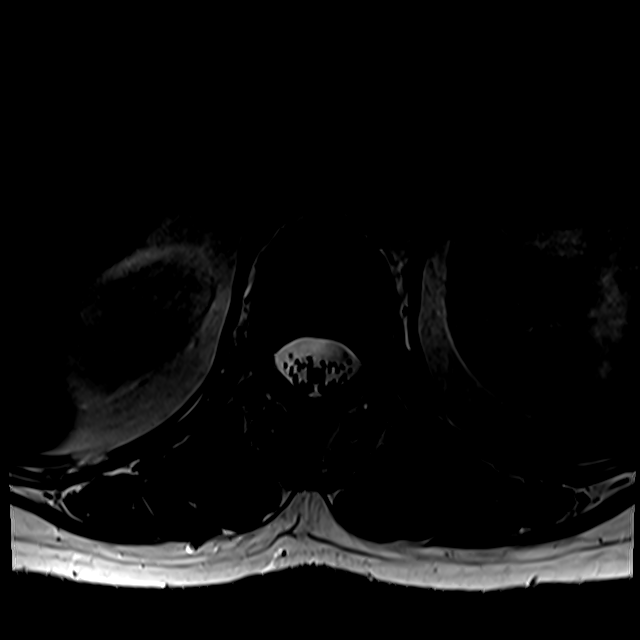

[Series 30: T2 post-contrast · sagittal · 4.0mm · 0.73mm/px · 2 of 15 slices shown (1 of 2)]
[im 1/15]
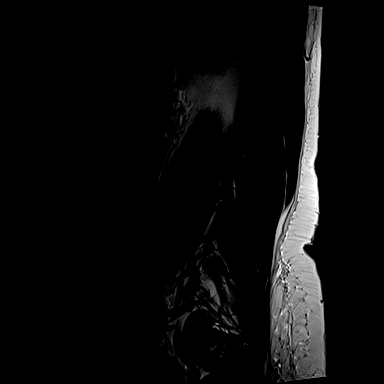
[im 15/15]
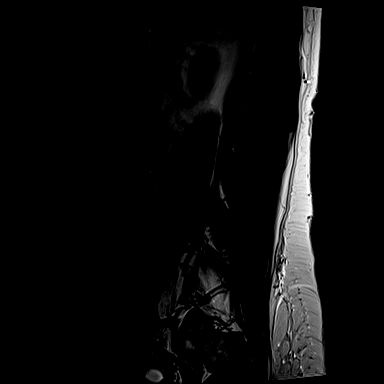

[Series 35: T2 post-contrast · sagittal · 3.0mm · 0.74mm/px · 2 of 15 slices shown (2 of 2)]
[im 1/15]
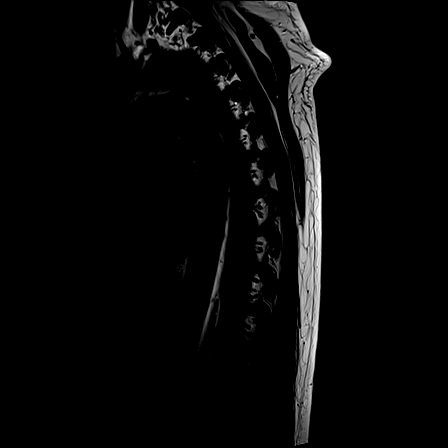
[im 15/15]
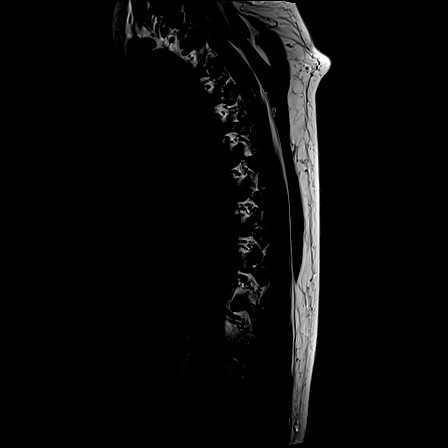

[19 of 48 positions shown; findings below may reference images not displayed]

FINDINGS: MRI THORACIC SPINE FINDINGS

Alignment:  Physiologic.

Vertebrae: On the large field-of-view sagittal sequence for counting
there is susceptibility artifact from hardware related to Chiari
surgery and posterior fusion of occipital bone, C2, and C3.
Otherwise no abnormal bone marrow or intervertebral disc signal. No
abnormal enhancement. Mild endplate degenerative changes of lower
thoracic spine Schmorl's nodes.

Cord: Normal signal and morphology. No abnormal enhancement. Dorsal
to the cord there are small serpiginous vessels extending from the
T7-8 level inferiorly to the conus (series 36, image 7).

Paraspinal and other soft tissues: Negative.

Disc levels:

Prominent ligamentum flavum at the T4-5 and T6-7 levels. No
significant canal or foraminal stenosis.

MRI LUMBAR SPINE FINDINGS

Segmentation:  Standard.

Alignment:  Physiologic.

Vertebrae: No fracture, evidence of discitis, or bone lesion. No
abnormal enhancement.

Conus medullaris: Extends to the lower T12 level and appears normal.
No abnormal enhancement.

Paraspinal and other soft tissues: Negative.

Disc levels:

Mild disc desiccation and small disc bulges at the L4-5 and L5-S1
levels. Small left subarticular L3-4 annular fissure. No significant
foraminal or canal stenosis.
IMPRESSION: 1. No abnormal cord signal or enhancement. No acute osseous
abnormality. Minimal spinal degenerative changes without significant
canal stenosis or foraminal stenosis.
2. Small serpiginous vessels dorsal to the cord at the T7-8 level
extending inferiorly to the conus may be within normal limits. A
small type 1 dural AVF is possible. Dynamic contrast-enhanced MRA of
the spine is recommended.

By: Nicocarmen Hasa M.D.

## 2019-12-30 HISTORY — PX: OTHER SURGICAL HISTORY: SHX169

## 2020-01-09 NOTE — Progress Notes (Deleted)
PATIENT: April Meyer DOB: 1965/05/21  REASON FOR VISIT: follow up HISTORY FROM: patient  HISTORY OF PRESENT ILLNESS: Today 01/09/20 April Meyer is a 54 year old female with history of Ehlers-Danlos syndrome and spinal cord AVM that has been followed conservatively.  She had a VP shunt placed in October 2020 for elevated intracranial pressure.  Some question of low-grade glioma affecting the left thalamic area, was having severe headaches prior to the VP shunt, had good improvement postop.  Has complained of some tongue protrusion issues, Dr. Jannifer Franklin started on Prozac 20 mg.  Is on Aimovig.   HISTORY 05/23/2019 Dr. Jannifer Franklin: April Meyer is a 54 year old right-handed white female with a history of Ehlers-Danlos syndrome and spinal cord AVM that is being followed conservatively.  The patient has not had any new numbness or weakness of the extremities.  She did have a VP shunt placed on 30 December 2018 for elevated intracranial pressures.  The patient has had a suboccipital craniectomy previously.  There is some question of a low-grade glioma affecting the left thalamic area.  The patient was having severe headaches prior to the VP shunt, the shunt has resulted in good improvement of the headaches, she is only having about 2 per week now, her headaches continue to be activated by weather changes.  She indicates that she has had some tongue protrusions for several years but this seems to be getting worse, she has not been on any antipsychotic medications previously.  The patient has had abdominal pain since the VP shunt was placed.  The patient takes Diamox intermittently, in the past she did not tolerate daily Diamox or Topamax.  She has been on multiple medications for migraines previously including Topamax, propranolol, amitriptyline, Depakote, and Ajovy.  The Ajovy was tried prior to the VP shunt placement when the headaches were quite severe.  She returns for an evaluation.   REVIEW OF SYSTEMS: Out of a  complete 14 system review of symptoms, the patient complains only of the following symptoms, and all other reviewed systems are negative.  ALLERGIES: Allergies  Allergen Reactions  . Other Anaphylaxis    Steroids and Zucchini   . Prednisone Anaphylaxis  . Morphine And Related Itching    Severe    HOME MEDICATIONS: Outpatient Medications Prior to Visit  Medication Sig Dispense Refill  . albuterol (VENTOLIN HFA) 108 (90 Base) MCG/ACT inhaler Inhale 1-2 puffs into the lungs every 4 (four) hours as needed for wheezing or shortness of breath.    . cyclobenzaprine (FLEXERIL) 5 MG tablet Take 5 mg by mouth 3 (three) times daily as needed for muscle spasms.    Marland Kitchen dexlansoprazole (DEXILANT) 60 MG capsule Take 60 mg by mouth at bedtime.    . diazepam (VALIUM) 5 MG tablet Take 1 tablet (5 mg total) by mouth every 6 (six) hours as needed for anxiety. 15 tablet 0  . Erenumab-aooe (AIMOVIG) 140 MG/ML SOAJ Inject 140 mg into the skin every 30 (thirty) days. (Patient not taking: Reported on 07/28/2019) 1.12 mg 4  . FLUoxetine (PROZAC) 10 MG capsule One daily for 2 weeks, then 2 daily (Patient not taking: Reported on 07/28/2019) 60 capsule 3  . levothyroxine (SYNTHROID, LEVOTHROID) 125 MCG tablet Take 125-250 mcg by mouth See admin instructions. 250 mcg on Sat and Sun; 125 mcg Mon thru Fri    . ondansetron (ZOFRAN) 4 MG tablet Take 1 tablet (4 mg total) by mouth every 8 (eight) hours as needed for nausea or vomiting. 30 tablet 0  .  Oxycodone HCl 10 MG TABS Take 10 mg by mouth every 4 (four) hours as needed (pain.).     Marland Kitchen promethazine (PHENERGAN) 25 MG tablet Take 25 mg by mouth every 6 (six) hours as needed. Nausea    . valACYclovir (VALTREX) 1000 MG tablet Take 2 tabs (2 grams) po now and repeat in 12 hours prn coldsore. 20 tablet 1  . Vitamin D, Ergocalciferol, (DRISDOL) 1.25 MG (50000 UT) CAPS capsule Take 50,000 Units by mouth every Wednesday.     No facility-administered medications prior to visit.     PAST MEDICAL HISTORY: Past Medical History:  Diagnosis Date  . ADHD (attention deficit hyperactivity disorder)   . Anxiety   . Arnold-Chiari deformity ([place])   . AVM (arteriovenous malformation) spine 04/21/2018  . Brain tumor ([place])   . Cerebral venous sinus thrombosis, remote, resolved 03/28/2013  . Chronic pain   . Common migraine with intractable migraine 04/21/2018  . EDS (Ehlers-Danlos syndrome)   . Eustachian tube dysfunction   . GERD (gastroesophageal reflux disease)   . Hemorrhoids   . HSV infection   . Hypothyroidism   . IIH (idiopathic intracranial hypertension)   . Migraine   . Nystagmus, positional, central type 03/28/2013  . Obstructive sleep apnea   . Other malaise and fatigue 03/28/2013   " The patient reports feeling happy, ED, sore" on she has undergone at Chiari malformation surgery decompression in 2011. Prior to the surgery were chief complaints were weakness numbness and neck problems. She had swallowing difficulties and dizziness;  Classic Chiari presentation. But she didn't have headaches.  . Vitamin D deficiency     PAST SURGICAL HISTORY: Past Surgical History:  Procedure Laterality Date  . ARNOLD CHIARI REPAIR    . Brain shunt    . BRAIN SURGERY    . BREAST BIOPSY Left   . BREAST EXCISIONAL BIOPSY Right   . CHOLECYSTECTOMY    . DILATION AND CURETTAGE OF UTERUS    . ENDOMETRIAL ABLATION  02/09/2013   HerOption  . INTRAUTERINE DEVICE INSERTION  10/2010   Mirena  . IUD REMOVAL  11/2010   could not tolerate hormonal side effects  . neck fusion c1-c3    . TONSILLECTOMY AND ADENOIDECTOMY      FAMILY HISTORY: Family History  Problem Relation Age of Onset  . Aortic dissection Father   . Diabetes Maternal Aunt   . Breast cancer Maternal Aunt        40's  . Diabetes Maternal Uncle   . Breast cancer Paternal Aunt 19  . Diabetes Maternal Grandmother   . Cancer Maternal Grandmother        SKIN AND LUNG  . Diabetes Paternal Grandmother   . Breast  cancer Maternal Aunt        50's  . Breast cancer Maternal Aunt        50's    SOCIAL HISTORY: Social History   Socioeconomic History  . Marital status: Divorced    Spouse name: Not on file  . Number of children: 1  . Years of education: 37  . Highest education level: Not on file  Occupational History    Employer: OTHER  Tobacco Use  . Smoking status: Former Smoker    Quit date: 09/09/2007    Years since quitting: 12.3  . Smokeless tobacco: Never Used  Vaping Use  . Vaping Use: Never used  Substance and Sexual Activity  . Alcohol use: No  . Drug use: No  . Sexual activity:  Yes    Partners: Male    Birth control/protection: Other-see comments    Comment: Vasectomy-1st intercourse 54 yo-More than 5 partners  Other Topics Concern  . Not on file  Social History Narrative   Patient is divorced and lives at home with her one child.   Patient is disabled.   Patient is right-handed.   Patient has a college education.   Patient drinks one cup of coffee daily.   Social Determinants of Health   Financial Resource Strain:   . Difficulty of Paying Living Expenses: Not on file  Food Insecurity:   . Worried About Charity fundraiser in the Last Year: Not on file  . Ran Out of Food in the Last Year: Not on file  Transportation Needs:   . Lack of Transportation (Medical): Not on file  . Lack of Transportation (Non-Medical): Not on file  Physical Activity:   . Days of Exercise per Week: Not on file  . Minutes of Exercise per Session: Not on file  Stress:   . Feeling of Stress : Not on file  Social Connections:   . Frequency of Communication with Friends and Family: Not on file  . Frequency of Social Gatherings with Friends and Family: Not on file  . Attends Religious Services: Not on file  . Active Member of Clubs or Organizations: Not on file  . Attends Archivist Meetings: Not on file  . Marital Status: Not on file  Intimate Partner Violence:   . Fear of Current  or Ex-Partner: Not on file  . Emotionally Abused: Not on file  . Physically Abused: Not on file  . Sexually Abused: Not on file      PHYSICAL EXAM  There were no vitals filed for this visit. There is no height or weight on file to calculate BMI.  Generalized: Well developed, in no acute distress   Neurological examination  Mentation: Alert oriented to time, place, history taking. Follows all commands speech and language fluent Cranial nerve II-XII: Pupils were equal round reactive to light. Extraocular movements were full, visual field were full on confrontational test. Facial sensation and strength were normal. Uvula tongue midline. Head turning and shoulder shrug  were normal and symmetric. Motor: The motor testing reveals 5 over 5 strength of all 4 extremities. Good symmetric motor tone is noted throughout.  Sensory: Sensory testing is intact to soft touch on all 4 extremities. No evidence of extinction is noted.  Coordination: Cerebellar testing reveals good finger-nose-finger and heel-to-shin bilaterally.  Gait and station: Gait is normal. Tandem gait is normal. Romberg is negative. No drift is seen.  Reflexes: Deep tendon reflexes are symmetric and normal bilaterally.   DIAGNOSTIC DATA (LABS, IMAGING, TESTING) - I reviewed patient records, labs, notes, testing and imaging myself where available.  Lab Results  Component Value Date   WBC 7.7 01/02/2019   HGB 11.0 (L) 01/02/2019   HCT 34.2 (L) 01/02/2019   MCV 92.4 01/02/2019   PLT 180 01/02/2019      Component Value Date/Time   NA 139 01/02/2019 2029   K 4.2 01/02/2019 2029   CL 106 01/02/2019 2029   CO2 25 01/02/2019 2029   GLUCOSE 108 (H) 01/02/2019 2029   BUN 7 01/02/2019 2029   CREATININE 0.80 01/02/2019 2029   CALCIUM 8.2 (L) 01/02/2019 2029   PROT 6.2 (L) 01/02/2019 2029   ALBUMIN 3.4 (L) 01/02/2019 2029   AST 14 (L) 01/02/2019 2029   ALT 9 01/02/2019  2029   ALKPHOS 48 01/02/2019 2029   BILITOT 0.4  01/02/2019 2029   GFRNONAA >60 01/02/2019 2029   GFRAA >60 01/02/2019 2029   No results found for: CHOL, HDL, LDLCALC, LDLDIRECT, TRIG, CHOLHDL No results found for: HGBA1C No results found for: VITAMINB12 Lab Results  Component Value Date   TSH 0.40 01/24/2019      ASSESSMENT AND PLAN 55 y.o. year old female  has a past medical history of ADHD (attention deficit hyperactivity disorder), Anxiety, Arnold-Chiari deformity ([place]), AVM (arteriovenous malformation) spine (04/21/2018), Brain tumor ([place]), Cerebral venous sinus thrombosis, remote, resolved (03/28/2013), Chronic pain, Common migraine with intractable migraine (04/21/2018), EDS (Ehlers-Danlos syndrome), Eustachian tube dysfunction, GERD (gastroesophageal reflux disease), Hemorrhoids, HSV infection, Hypothyroidism, IIH (idiopathic intracranial hypertension), Migraine, Nystagmus, positional, central type (03/28/2013), Obstructive sleep apnea, Other malaise and fatigue (03/28/2013), and Vitamin D deficiency. here with ***   I spent 15 minutes with the patient. 50% of this time was spent   Butler Denmark, Winfred, DNP 01/09/2020, 1:47 PM [facility] [address], [place], [place] [address] [phone]

## 2020-01-10 ENCOUNTER — Encounter: Payer: Self-pay | Admitting: Neurology

## 2020-01-10 ENCOUNTER — Ambulatory Visit: Payer: PPO | Admitting: Neurology

## 2020-01-19 ENCOUNTER — Other Ambulatory Visit: Payer: Self-pay

## 2020-01-19 MED ORDER — VALACYCLOVIR HCL 1 G PO TABS: ORAL_TABLET | ORAL | 1 refills | Status: DC

## 2020-01-30 DIAGNOSIS — Z79891 Long term (current) use of opiate analgesic: Secondary | ICD-10-CM | POA: Diagnosis not present

## 2020-01-30 DIAGNOSIS — M62830 Muscle spasm of back: Secondary | ICD-10-CM | POA: Diagnosis not present

## 2020-01-30 DIAGNOSIS — G894 Chronic pain syndrome: Secondary | ICD-10-CM | POA: Diagnosis not present

## 2020-01-30 DIAGNOSIS — M961 Postlaminectomy syndrome, not elsewhere classified: Secondary | ICD-10-CM | POA: Diagnosis not present

## 2020-03-19 ENCOUNTER — Telehealth: Payer: Self-pay | Admitting: Emergency Medicine

## 2020-03-19 NOTE — Telephone Encounter (Signed)
[place] [id number] for Ajovy PA deleted..  Patient does not use.

## 2020-03-19 NOTE — Telephone Encounter (Signed)
Received PA for Aimovig, [id number], Aimovig 140MG /ML auto-injectors through [facility].  No records of any refills requested since being prescribed in 05/2019.  Called patient and verified that she wasn't using medicaiton.  Key Deleted in [facility]

## 2020-04-16 DIAGNOSIS — M62830 Muscle spasm of back: Secondary | ICD-10-CM | POA: Diagnosis not present

## 2020-04-16 DIAGNOSIS — Z79891 Long term (current) use of opiate analgesic: Secondary | ICD-10-CM | POA: Diagnosis not present

## 2020-04-16 DIAGNOSIS — G894 Chronic pain syndrome: Secondary | ICD-10-CM | POA: Diagnosis not present

## 2020-04-16 DIAGNOSIS — M961 Postlaminectomy syndrome, not elsewhere classified: Secondary | ICD-10-CM | POA: Diagnosis not present

## 2020-04-19 ENCOUNTER — Encounter: Payer: Self-pay | Admitting: Obstetrics and Gynecology

## 2020-04-19 NOTE — Telephone Encounter (Signed)
A cyst of the ovary is 1 of many possibilities, but I don't think PCOS Should schedule an appointment, next available/not urgent if this has been going on 6 months

## 2020-05-01 ENCOUNTER — Other Ambulatory Visit: Payer: Self-pay

## 2020-05-01 ENCOUNTER — Encounter: Payer: Self-pay | Admitting: Obstetrics and Gynecology

## 2020-05-01 ENCOUNTER — Ambulatory Visit: Payer: PPO | Admitting: Obstetrics and Gynecology

## 2020-05-01 VITALS — BP 120/78

## 2020-05-01 DIAGNOSIS — N921 Excessive and frequent menstruation with irregular cycle: Secondary | ICD-10-CM | POA: Diagnosis not present

## 2020-05-01 DIAGNOSIS — R350 Frequency of micturition: Secondary | ICD-10-CM | POA: Diagnosis not present

## 2020-05-01 DIAGNOSIS — R102 Pelvic and perineal pain: Secondary | ICD-10-CM | POA: Diagnosis not present

## 2020-05-01 DIAGNOSIS — R300 Dysuria: Secondary | ICD-10-CM | POA: Diagnosis not present

## 2020-05-01 DIAGNOSIS — N939 Abnormal uterine and vaginal bleeding, unspecified: Secondary | ICD-10-CM | POA: Diagnosis not present

## 2020-05-01 LAB — URINALYSIS, COMPLETE W/RFL CULTURE
Bacteria, UA: NONE SEEN /HPF
Bilirubin Urine: NEGATIVE
Glucose, UA: NEGATIVE
Hyaline Cast: NONE SEEN /LPF
Ketones, ur: NEGATIVE
Leukocyte Esterase: NEGATIVE
Nitrites, Initial: NEGATIVE
Protein, ur: NEGATIVE
RBC / HPF: NONE SEEN /HPF (ref 0–2)
Specific Gravity, Urine: 1.01 (ref 1.001–1.03)
WBC, UA: NONE SEEN /HPF (ref 0–5)
pH: 6 (ref 5.0–8.0)

## 2020-05-01 LAB — NO CULTURE INDICATED

## 2020-05-01 NOTE — Progress Notes (Signed)
April Meyer 02-25-1966 [id number]  SUBJECTIVE:  55 y.o. G64P1011 female presents for evaluation of intermittent right pelvic pain and irregular periods with sometimes heavy menstrual bleeding.  Currently not having any abdominal/pelvic pain or bleeding today. Says she has had irregular periods since her ablation sometimes frequently, never heavy.  Had a HerOption endometrial ablation in 02/2013.  Within the last 6 months her periods have been getting heavier and more frequent.  Sometimes only 1 day without bleeding, flow can be heavy. Also has been having right sided pelvic pain in the last few months. Pain occurs in between her periods usually goes away with onset of her period.  Stabbing and pinching starts in mid cycle. Says her blood sugars have been running higher lately so she was started on Metformin by her PCP. She does have a history of an AV malformation in the spine, cerebral venous sinus thrombosis in 2015, Ehlers-Danlos syndrome, and recently had a shunt placed due to increased intracranial pressure.  Back problems and unable to exercise.   Current Outpatient Medications  Medication Sig Dispense Refill  . albuterol (VENTOLIN HFA) 108 (90 Base) MCG/ACT inhaler Inhale 1-2 puffs into the lungs every 4 (four) hours as needed for wheezing or shortness of breath.    . cyclobenzaprine (FLEXERIL) 5 MG tablet Take 5 mg by mouth 3 (three) times daily as needed for muscle spasms.    Marland Kitchen dexlansoprazole (DEXILANT) 60 MG capsule Take 60 mg by mouth at bedtime.    April Meyer (AIMOVIG) 140 MG/ML SOAJ Inject 140 mg into the skin every 30 (thirty) days. 1.12 mg 4  . lamoTRIgine (LAMICTAL) 25 MG tablet ONE BY MOUTH DAILY AND IN 2 WEEKS INCREASE TO 1 BY MOUTH TWICE A DAY    . levothyroxine (SYNTHROID, LEVOTHROID) 125 MCG tablet Take 125-250 mcg by mouth See admin instructions. 250 mcg on Sat and Sun; 125 mcg Mon thru Fri    . metFORMIN (GLUCOPHAGE-XR) 500 MG 24 hr tablet Take 500 mg by mouth daily.     . ondansetron (ZOFRAN) 4 MG tablet Take 1 tablet (4 mg total) by mouth every 8 (eight) hours as needed for nausea or vomiting. 30 tablet 0  . Oxycodone HCl 10 MG TABS Take 10 mg by mouth every 4 (four) hours as needed (pain.).     Marland Kitchen promethazine (PHENERGAN) 25 MG tablet Take 25 mg by mouth every 6 (six) hours as needed. Nausea    . valACYclovir (VALTREX) 1000 MG tablet Take 2 tabs (2 grams) po now and repeat in 12 hours prn coldsore. 20 tablet 1   No current facility-administered medications for this visit.   Allergies: Other, Prednisone, and Morphine and related  No LMP recorded. Patient has had an ablation.  Past medical history,surgical history, problem list, medications, allergies, family history and social history were all reviewed and documented as reviewed in the EPIC chart.  ROS: Positives and negatives as reviewed in HPI   OBJECTIVE:  BP 120/78 (BP Location: Right Arm, Patient Position: Sitting, Cuff Size: Normal)  The patient appears well, alert, oriented, in no distress. PELVIC EXAM: Deferred to future visit  Analysis unremarkable   ASSESSMENT:  55 y.o. G2P1011 here for discussion of evaluation of abnormal uterine bleeding and intermittent/cyclic right pelvic pain  PLAN:  1.  Abnormal uterine bleeding.  Prior endometrial ablation.  Will check CBC, TSH, FSH.  Of note FSH was into the perimenopausal range just over a year ago.  Sounds like she has had periods since  her ablation but the bleeding has gotten heavier and more frequent/longer lasting.  We discussed the differential diagnosis could include failed ablation, menopausal hormonal changes leading to abnormal bleeding, development of endometrial polyps, thyroid dysregulation, or development of abnormal tissue including hyperplasia.  I recommended that she come back for a pelvic ultrasound and possible endometrial biopsy (with the prior ablation it may be difficult to achieve a sufficient tissue biopsy which we explained).   She will plan to schedule.  2.  Right pelvic pain.  Sounds to be cyclic in nature.  Uncertain if this is an ovarian issue as typically a persistent cyst would cause more consistent ongoing discomfort rather than cyclic pain.  Think less likely a polycystic ovary.  Metformin can also cause abdominal pains but that would not explain the cyclicity of the discomfort.  We will check a pelvic ultrasound to rule out ovarian cyst as above.  The patient is aware that I will only be at this practice until early March 2022 so she knows to make sure she requests follow-up when I am no longer at the practice, and that her ongoing work-up will be with a different provider.3 3.  Urinary frequency.  Urinalysis unremarkable.  She is aware that her other medical conditions may increase neurogenic bladder symptoms and if bothersome we could refer to urogynecology.   April Pierini MD 05/01/20

## 2020-05-02 LAB — CBC
HCT: 39.1 % (ref 35.0–45.0)
Hemoglobin: 12.7 g/dL (ref 11.7–15.5)
MCH: 30.2 pg (ref 27.0–33.0)
MCHC: 32.5 g/dL (ref 32.0–36.0)
MCV: 92.9 fL (ref 80.0–100.0)
MPV: 11.4 fL (ref 7.5–12.5)
Platelets: 220 10*3/uL (ref 140–400)
RBC: 4.21 10*6/uL (ref 3.80–5.10)
RDW: 13 % (ref 11.0–15.0)
WBC: 6.4 10*3/uL (ref 3.8–10.8)

## 2020-05-02 LAB — TSH: TSH: 4.86 mIU/L — ABNORMAL HIGH

## 2020-05-02 LAB — FOLLICLE STIMULATING HORMONE: FSH: 36.9 m[IU]/mL

## 2020-05-03 ENCOUNTER — Other Ambulatory Visit: Payer: Self-pay

## 2020-05-03 DIAGNOSIS — R7989 Other specified abnormal findings of blood chemistry: Secondary | ICD-10-CM

## 2020-05-03 NOTE — Telephone Encounter (Signed)
Yes

## 2020-05-03 NOTE — Telephone Encounter (Signed)
Dr. Baldemar Friday 10 is just two weeks. Are you okay with her rechecking it then?

## 2020-05-08 DIAGNOSIS — E039 Hypothyroidism, unspecified: Secondary | ICD-10-CM | POA: Diagnosis not present

## 2020-05-08 DIAGNOSIS — F39 Unspecified mood [affective] disorder: Secondary | ICD-10-CM | POA: Diagnosis not present

## 2020-05-08 DIAGNOSIS — N951 Menopausal and female climacteric states: Secondary | ICD-10-CM | POA: Diagnosis not present

## 2020-05-08 DIAGNOSIS — L02214 Cutaneous abscess of groin: Secondary | ICD-10-CM | POA: Diagnosis not present

## 2020-05-17 ENCOUNTER — Ambulatory Visit: Payer: PPO | Admitting: Obstetrics and Gynecology

## 2020-05-17 ENCOUNTER — Encounter: Payer: Self-pay | Admitting: Obstetrics and Gynecology

## 2020-05-17 ENCOUNTER — Ambulatory Visit (INDEPENDENT_AMBULATORY_CARE_PROVIDER_SITE_OTHER): Payer: PPO

## 2020-05-17 ENCOUNTER — Other Ambulatory Visit: Payer: Self-pay

## 2020-05-17 VITALS — BP 100/66 | HR 77 | Ht 67.0 in | Wt 214.8 lb

## 2020-05-17 DIAGNOSIS — R102 Pelvic and perineal pain: Secondary | ICD-10-CM

## 2020-05-17 DIAGNOSIS — N939 Abnormal uterine and vaginal bleeding, unspecified: Secondary | ICD-10-CM

## 2020-05-17 DIAGNOSIS — N921 Excessive and frequent menstruation with irregular cycle: Secondary | ICD-10-CM

## 2020-05-17 DIAGNOSIS — Z9889 Other specified postprocedural states: Secondary | ICD-10-CM | POA: Diagnosis not present

## 2020-05-17 DIAGNOSIS — N920 Excessive and frequent menstruation with regular cycle: Secondary | ICD-10-CM

## 2020-05-17 NOTE — Progress Notes (Signed)
GYNECOLOGY  VISIT   HPI: 55 y.o.   Divorced White or Caucasian Not Hispanic or Latino  female   [phone] with No LMP recorded. Patient has had an ablation.   here for ultrasound consult for AUB and intermittent/cyclic right sided pelvic pain. . She had an endometrial ablation in 12/14. She has had cycles since her ablation. They gotten heavier with time.  Cycles occur monthly x 5-6 days (sometimes starts and stop). She is saturating a super tampon in within minutes. So heavy for one day that she can't leave the house.  Recent TSH was mildly elevated, synthroid dose was just increased.  2/22 labs  FSH 36.9 HGB 12.7  She feels a bulge in her vagina when she needs to have a BM. She is constipated (on opioids). Doesn't notice a bulge unless she has to have a BM.  She recently saw a Urogynecologist and had urodynamics.  She leaks daily, random leaking. She always leaks with coughing and sneezing. Leaks on the way to the bathroom.    GYNECOLOGIC HISTORY: No LMP recorded. Patient has had an ablation. Contraception: none  Menopausal hormone therapy: none         OB History    Gravida  2   Para  1   Term  1   Preterm      AB  1   Living  1     SAB      IAB      Ectopic  1   Multiple      Live Births                 Patient Active Problem List   Diagnosis Date Noted  . AVM (arteriovenous malformation) spine 04/21/2018  . Common migraine with intractable migraine 04/21/2018  . Other malaise and fatigue 03/28/2013  . History of Chiari malformation 03/28/2013  . Hypersomnia, persistent 03/28/2013  . Cerebral venous sinus thrombosis, remote, resolved 03/28/2013  . Nystagmus, positional, central type 03/28/2013  . Menorrhagia 07/31/2011    Past Medical History:  Diagnosis Date  . ADHD (attention deficit hyperactivity disorder)   . Anxiety   . Arnold-Chiari deformity ([place])   . AVM (arteriovenous malformation) spine 04/21/2018  . Brain tumor ([place])   . Cerebral  venous sinus thrombosis, remote, resolved 03/28/2013  . Chronic pain   . Common migraine with intractable migraine 04/21/2018  . EDS (Ehlers-Danlos syndrome)   . Eustachian tube dysfunction   . GERD (gastroesophageal reflux disease)   . Hemorrhoids   . HSV infection   . Hypothyroidism   . IIH (idiopathic intracranial hypertension)   . Migraine   . Nystagmus, positional, central type 03/28/2013  . Obstructive sleep apnea   . Other malaise and fatigue 03/28/2013   " The patient reports feeling happy, ED, sore" on she has undergone at Chiari malformation surgery decompression in 2011. Prior to the surgery were chief complaints were weakness numbness and neck problems. She had swallowing difficulties and dizziness;  Classic Chiari presentation. But she didn't have headaches.  . Vitamin D deficiency     Past Surgical History:  Procedure Laterality Date  . ARNOLD CHIARI REPAIR    . Brain shunt    . BRAIN SURGERY    . BREAST BIOPSY Left   . BREAST EXCISIONAL BIOPSY Right   . CHOLECYSTECTOMY    . DILATION AND CURETTAGE OF UTERUS    . ENDOMETRIAL ABLATION  02/09/2013   [facility]  . INTRAUTERINE DEVICE INSERTION  10/2010  Mirena  . IUD REMOVAL  11/2010   could not tolerate hormonal side effects  . neck fusion c1-c3    . TONSILLECTOMY AND ADENOIDECTOMY      Current Outpatient Medications  Medication Sig Dispense Refill  . albuterol (VENTOLIN HFA) 108 (90 Base) MCG/ACT inhaler Inhale 1-2 puffs into the lungs every 4 (four) hours as needed for wheezing or shortness of breath.    . cyclobenzaprine (FLEXERIL) 5 MG tablet Take 5 mg by mouth 3 (three) times daily as needed for muscle spasms.    Marland Kitchen dexlansoprazole (DEXILANT) 60 MG capsule Take 60 mg by mouth at bedtime.    . lamoTRIgine (LAMICTAL) 25 MG tablet ONE BY MOUTH DAILY AND IN 2 WEEKS INCREASE TO 1 BY MOUTH TWICE A DAY    . metFORMIN (GLUCOPHAGE-XR) 500 MG 24 hr tablet Take 500 mg by mouth daily.    . ondansetron (ZOFRAN) 4 MG tablet  Take 1 tablet (4 mg total) by mouth every 8 (eight) hours as needed for nausea or vomiting. 30 tablet 0  . Oxycodone HCl 10 MG TABS Take 10 mg by mouth every 4 (four) hours as needed (pain.).     Marland Kitchen promethazine (PHENERGAN) 25 MG tablet Take 25 mg by mouth every 6 (six) hours as needed. Nausea    . SYNTHROID 137 MCG tablet TAKE 1 TABLET BY MOUTH DAILY IN THE MORNING ON AN EMPTY STOMACH    . valACYclovir (VALTREX) 1000 MG tablet Take 2 tabs (2 grams) po now and repeat in 12 hours prn coldsore. 20 tablet 1   No current facility-administered medications for this visit.     ALLERGIES: Other, Prednisone, and Morphine and related  Family History  Problem Relation Age of Onset  . Aortic dissection Father   . Diabetes Maternal Aunt   . Breast cancer Maternal Aunt        40's  . Diabetes Maternal Uncle   . Breast cancer Paternal Aunt 72  . Diabetes Maternal Grandmother   . Cancer Maternal Grandmother        SKIN AND LUNG  . Diabetes Paternal Grandmother   . Breast cancer Maternal Aunt        50's  . Breast cancer Maternal Aunt        50's    Social History   Socioeconomic History  . Marital status: Divorced    Spouse name: Not on file  . Number of children: 1  . Years of education: 44  . Highest education level: Not on file  Occupational History    Employer: OTHER  Tobacco Use  . Smoking status: Former Smoker    Quit date: 09/09/2007    Years since quitting: 12.6  . Smokeless tobacco: Never Used  Vaping Use  . Vaping Use: Never used  Substance and Sexual Activity  . Alcohol use: No  . Drug use: No  . Sexual activity: Yes    Partners: Male    Birth control/protection: Other-see comments    Comment: Vasectomy-1st intercourse 55 yo-More than 5 partners  Other Topics Concern  . Not on file  Social History Narrative   Patient is divorced and lives at home with her one child.   Patient is disabled.   Patient is right-handed.   Patient has a college education.   Patient  drinks one cup of coffee daily.   Social Determinants of Health   Financial Resource Strain: Not on file  Food Insecurity: Not on file  Transportation Needs: Not on file  Physical Activity: Not on file  Stress: Not on file  Social Connections: Not on file  Intimate Partner Violence: Not on file    Review of Systems  All other systems reviewed and are negative.   PHYSICAL EXAMINATION:    BP 100/66   Pulse 77   Ht 5\' 7"  (1.702 m)   Wt 214 lb 12.8 oz (97.4 kg)   SpO2 97%   BMI 33.64 kg/m     General appearance: alert, cooperative and appears stated age  Pelvic ultrasound  Indications: menorrhagia, h/o endometrial ablation, pelvic pain  Findings:  Anteverted Uterus 6.97 x 4.76 x 3.84 cm  Endometrium 4.72 mm  Left ovary 3.27 x 1.87 x 1.97 cm  2.75 x 1.70 cm follicle  0.17 x .63 cm follicle  Right ovary 4.94 x 1.28 x 1.58 cm  1.1 x 0.8 cm follicle  2 cystic areas seen under the right ovary, 4 mm and 5 mm, possible para ovarian or para tubal cysts.  No free fluid   Impression:  Normal uterus Normal adnexa bilaterally   1. Menorrhagia with regular cycle No findings on ultrasound, given her h/o endometrial ablation I suspect endometrial scar tissue. Will have her return for a sonohysterogram to better evaluate her endometrial cavity to see if she is a candidate for a mirena IUD. Will do an endometrial biopsy under ultrasound guidance Depending on the results of the sonohysterogram will further discuss options for treatment  2. Pelvic pain No findings on ultrasound to explain her pain  3. History of endometrial ablation  Based on her c/o, I suspect that she has a rectocele. Will evaluate her for this at her f/u visit.   I have reviewed her Urogynecology notes from [facility] on 09/07/17 (care everywhere): She was noted to have mixed incontinence, stage 2 uterovaginal prolapse and defecatory dysfunction. She did have urodynamic testing with [facility] noted. If surgery was  desired they recommended retropubic midurethral sling  In addition to reviewing the ultrasound images with the patient, over 15 minutes was spent in reviewing her records in care everywhere and discussing recommendation for further evaluation and possible management.

## 2020-05-21 ENCOUNTER — Other Ambulatory Visit: Payer: Self-pay

## 2020-05-21 DIAGNOSIS — N921 Excessive and frequent menstruation with irregular cycle: Secondary | ICD-10-CM

## 2020-05-23 ENCOUNTER — Encounter: Payer: Self-pay | Admitting: Obstetrics and Gynecology

## 2020-06-05 ENCOUNTER — Ambulatory Visit (INDEPENDENT_AMBULATORY_CARE_PROVIDER_SITE_OTHER): Payer: PPO

## 2020-06-05 ENCOUNTER — Ambulatory Visit: Payer: PPO | Admitting: Obstetrics and Gynecology

## 2020-06-05 ENCOUNTER — Encounter: Payer: Self-pay | Admitting: Obstetrics and Gynecology

## 2020-06-05 ENCOUNTER — Other Ambulatory Visit (HOSPITAL_COMMUNITY)
Admission: RE | Admit: 2020-06-05 | Discharge: 2020-06-05 | Disposition: A | Discharge status: Home / Self Care | Payer: PPO | Source: Ambulatory Visit | Attending: Obstetrics and Gynecology | Admitting: Obstetrics and Gynecology

## 2020-06-05 ENCOUNTER — Other Ambulatory Visit: Payer: Self-pay

## 2020-06-05 VITALS — BP 130/72 | HR 77 | Ht 67.0 in | Wt 210.0 lb

## 2020-06-05 DIAGNOSIS — N920 Excessive and frequent menstruation with regular cycle: Secondary | ICD-10-CM

## 2020-06-05 DIAGNOSIS — N921 Excessive and frequent menstruation with irregular cycle: Secondary | ICD-10-CM | POA: Diagnosis not present

## 2020-06-05 DIAGNOSIS — Z9889 Other specified postprocedural states: Secondary | ICD-10-CM | POA: Diagnosis not present

## 2020-06-05 DIAGNOSIS — N840 Polyp of corpus uteri: Secondary | ICD-10-CM | POA: Diagnosis not present

## 2020-06-05 DIAGNOSIS — N3946 Mixed incontinence: Secondary | ICD-10-CM

## 2020-06-05 DIAGNOSIS — N816 Rectocele: Secondary | ICD-10-CM | POA: Diagnosis not present

## 2020-06-05 DIAGNOSIS — N8111 Cystocele, midline: Secondary | ICD-10-CM | POA: Diagnosis not present

## 2020-06-05 MED ORDER — NORETHINDRONE 0.35 MG PO TABS: 1.0000 | ORAL_TABLET | Freq: Every day | ORAL | 0 refills | Status: DC

## 2020-06-05 NOTE — Patient Instructions (Signed)

## 2020-06-05 NOTE — Progress Notes (Signed)
GYNECOLOGY  VISIT   HPI: 55 y.o.   Divorced White or Caucasian Not Hispanic or Latino  female   [phone] with No LMP recorded. Patient has had an ablation.  here for a sonohysterogram and endometrial biopsy. The patient has a h/o an endometrial ablation in 12/14. She has been having menorrhagia. Recent adjustment in her synthroid. Not anemic. FSH 36.9.   H/O GSI, leaks a moderate amount daily. Wears a pad, changes it 2 x a day. Also has a h/o urge incontinence. Urge>stress. Has seen a Urogynecologist. Not worsening.   GYNECOLOGIC HISTORY: No LMP recorded. Patient has had an ablation. Contraception: vasectomy. Menopausal hormone therapy: none         OB History    Gravida  2   Para  1   Term  1   Preterm      AB  1   Living  1     SAB      IAB      Ectopic  1   Multiple      Live Births                 Patient Active Problem List   Diagnosis Date Noted  . AVM (arteriovenous malformation) spine 04/21/2018  . Common migraine with intractable migraine 04/21/2018  . Other malaise and fatigue 03/28/2013  . History of Chiari malformation 03/28/2013  . Hypersomnia, persistent 03/28/2013  . Cerebral venous sinus thrombosis, remote, resolved 03/28/2013  . Nystagmus, positional, central type 03/28/2013  . Menorrhagia 07/31/2011    Past Medical History:  Diagnosis Date  . ADHD (attention deficit hyperactivity disorder)   . Anxiety   . Arnold-Chiari deformity ([place])   . AVM (arteriovenous malformation) spine 04/21/2018  . Brain tumor ([place])   . Cerebral venous sinus thrombosis, remote, resolved 03/28/2013  . Chronic pain   . Common migraine with intractable migraine 04/21/2018  . EDS (Ehlers-Danlos syndrome)   . Eustachian tube dysfunction   . GERD (gastroesophageal reflux disease)   . Hemorrhoids   . HSV infection   . Hypothyroidism   . IIH (idiopathic intracranial hypertension)   . Migraine   . Nystagmus, positional, central type 03/28/2013  . Obstructive sleep  apnea   . Other malaise and fatigue 03/28/2013   " The patient reports feeling happy, ED, sore" on she has undergone at Chiari malformation surgery decompression in 2011. Prior to the surgery were chief complaints were weakness numbness and neck problems. She had swallowing difficulties and dizziness;  Classic Chiari presentation. But she didn't have headaches.  . Vitamin D deficiency     Past Surgical History:  Procedure Laterality Date  . ARNOLD CHIARI REPAIR    . Brain shunt    . BRAIN SURGERY    . BREAST BIOPSY Left   . BREAST EXCISIONAL BIOPSY Right   . CHOLECYSTECTOMY    . DILATION AND CURETTAGE OF UTERUS    . ENDOMETRIAL ABLATION  02/09/2013   Her[facility]  . INTRAUTERINE DEVICE INSERTION  10/2010   Mirena  . IUD REMOVAL  11/2010   could not tolerate hormonal side effects  . neck fusion c1-c3    . TONSILLECTOMY AND ADENOIDECTOMY      Current Outpatient Medications  Medication Sig Dispense Refill  . albuterol (VENTOLIN HFA) 108 (90 Base) MCG/ACT inhaler Inhale 1-2 puffs into the lungs every 4 (four) hours as needed for wheezing or shortness of breath.    . cyclobenzaprine (FLEXERIL) 5 MG tablet Take 5 mg by mouth  3 (three) times daily as needed for muscle spasms.    Marland Kitchen dexlansoprazole (DEXILANT) 60 MG capsule Take 60 mg by mouth at bedtime.    . lamoTRIgine (LAMICTAL) 25 MG tablet ONE BY MOUTH DAILY AND IN 2 WEEKS INCREASE TO 1 BY MOUTH TWICE A DAY    . metFORMIN (GLUCOPHAGE-XR) 500 MG 24 hr tablet Take 500 mg by mouth daily.    . ondansetron (ZOFRAN) 4 MG tablet Take 1 tablet (4 mg total) by mouth every 8 (eight) hours as needed for nausea or vomiting. 30 tablet 0  . Oxycodone HCl 10 MG TABS Take 10 mg by mouth every 4 (four) hours as needed (pain.).     Marland Kitchen promethazine (PHENERGAN) 25 MG tablet Take 25 mg by mouth every 6 (six) hours as needed. Nausea    . SYNTHROID 137 MCG tablet TAKE 1 TABLET BY MOUTH DAILY IN THE MORNING ON AN EMPTY STOMACH    . valACYclovir (VALTREX) 1000 MG  tablet Take 2 tabs (2 grams) po now and repeat in 12 hours prn coldsore. 20 tablet 1   No current facility-administered medications for this visit.     ALLERGIES: Other, Prednisone, and Morphine and related  Family History  Problem Relation Age of Onset  . Aortic dissection Father   . Diabetes Maternal Aunt   . Breast cancer Maternal Aunt        40's  . Diabetes Maternal Uncle   . Breast cancer Paternal Aunt 47  . Diabetes Maternal Grandmother   . Cancer Maternal Grandmother        SKIN AND LUNG  . Diabetes Paternal Grandmother   . Breast cancer Maternal Aunt        50's  . Breast cancer Maternal Aunt        50's    Social History   Socioeconomic History  . Marital status: Divorced    Spouse name: Not on file  . Number of children: 1  . Years of education: 63  . Highest education level: Not on file  Occupational History    Employer: OTHER  Tobacco Use  . Smoking status: Former Smoker    Quit date: 09/09/2007    Years since quitting: 12.7  . Smokeless tobacco: Never Used  Vaping Use  . Vaping Use: Never used  Substance and Sexual Activity  . Alcohol use: No  . Drug use: No  . Sexual activity: Yes    Partners: Male    Birth control/protection: Other-see comments    Comment: Vasectomy-1st intercourse 55 yo-More than 5 partners  Other Topics Concern  . Not on file  Social History Narrative   Patient is divorced and lives at home with her one child.   Patient is disabled.   Patient is right-handed.   Patient has a college education.   Patient drinks one cup of coffee daily.   Social Determinants of Health   Financial Resource Strain: Not on file  Food Insecurity: Not on file  Transportation Needs: Not on file  Physical Activity: Not on file  Stress: Not on file  Social Connections: Not on file  Intimate Partner Violence: Not on file    ROS  PHYSICAL EXAMINATION:    There were no vitals taken for this visit.    General appearance: alert, cooperative  and appears stated age   Pelvic: External genitalia:  no lesions              Urethra:  normal appearing urethra with no masses,  tenderness or lesions              Bartholins and Skenes: normal                 Vagina: normal appearing vagina with normal color and discharge, small grade 2 cystocele and rectocele with valsalva. No significant uterine prolapse while standing and valsalva              Cervix: no lesions               Pelvic ultrasound  Indications: menorrhagia, h/o endometrial ablation.   Findings:  Uterus not remeasured, normal sized earlier this month  Endometrium 4.81 cm  Left ovary 2.4 x 2.61 x 1.85 cm  1.3 x 1.3 cm follicle  Right ovary 3.26 x 2.5 x 3.04 cm  3.11 x 2.96 cm complex cyst consistent with a hemorrhagic CL (not seen on ultrasound earlier this month)    Sonohysterogram and endometrial biopsy The procedure and risks of the procedure were reviewed with the patient, consent form was signed. A speculum was placed in the vagina and the cervix was cleansed with Hibiclens. A tenaculum was placed on the anterior cervical lip. The sonohysterogram catheter was inserted into the uterine cavity without difficulty. Saline was infused under direct observation with the ultrasound. There was minimal distention of the uterine cavity, mild thickening posteriorly. The fluid was removed from the cavity and the uterine evacuator was used to perform the endometrial biopsy. The uterus sounded to 7 cm. The endometrial biopsy was performed, taking care to get a representative sample, sampling 360 degrees of the uterine cavity. Minimal tissue was obtained. The cavity was gritty throughout. The tenaculum was removed. There was bleeding from the left sided tenaculum site that was stopped with pressure and silver nitrate. The speculum was removed. There were no complications.   Impression: Normal sized anteverted uterus Thin endometrium Sonohystyerogram with minimal distention of  the endometrial cavity, possible posterior wall thickening vs scar tissue. Not a candidate for a mirena IUD.  Normal ovaries with a hemorrhagic CL in the right ovary  Chaperone was present for exam.  1. Menorrhagia with regular cycle No findings on ultrasound to explain the menorrhagia. Not anemic, recent adjustment in her synthroid Will do a trial of POP - norethindrone (MICRONOR) 0.35 MG tablet; Take 1 tablet (0.35 mg total) by mouth daily.  Dispense: 84 tablet; Refill: 0 - Surgical pathology( [facility]/ [facility]), endometrial biopsy sent -calendar bleeding and f/u in 3 months  2. History of endometrial ablation Uterine cavity is scarred, not a candidate for a mirena IUD  3. Rectocele Not bothersome Discussed avoiding heavy lifting and straining ACOG handout given and reviewed  4. Midline cystocele Not bothersome Discussed avoiding heavy lifting and straining ACOG handout given and reviewed   5. Mixed incontinence Not a change - Ambulatory referral to Physical Therapy  In addition to the sonohysterogram, endometrial biopsy and reviewing the ultrasound images, over 20 minutes was spent in total patient care, including education (about prolapse), management of her menorrhagia, and discussion about her incontinence (referral placed)

## 2020-06-06 ENCOUNTER — Telehealth: Payer: Self-pay | Admitting: *Deleted

## 2020-06-06 NOTE — Telephone Encounter (Signed)
Office notes faxed to [facility] [phone] for PT they will call to schedule.

## 2020-06-06 NOTE — Telephone Encounter (Signed)
-----   Message from Salvadore Dom, MD sent at 06/05/2020  5:39 PM EDT ----- I've placed a referral for pelvic floor PT with [facility].  Thanks, Sharee Pimple

## 2020-06-07 LAB — SURGICAL PATHOLOGY

## 2020-06-11 ENCOUNTER — Telehealth: Payer: Self-pay | Admitting: *Deleted

## 2020-06-11 NOTE — Telephone Encounter (Signed)
-----   Message from Salvadore Dom, MD sent at 06/07/2020  4:59 PM EDT ----- Please let the patient know that her biopsy returned with endometrial polyp. She should have a hysteroscopy, D&C under ultrasound guidance. Please call her and review this with her.

## 2020-06-11 NOTE — Telephone Encounter (Signed)
Spoke with patient. Advised of results and recommendations per Dr. Talbert Nan.  Patient request to further discuss prior to scheduling. OV scheduled for 4/5 at 4pm with Dr. Talbert Nan.   Result note routed to Dr. Talbert Nan.   Encounter closed.

## 2020-06-11 NOTE — Telephone Encounter (Signed)
Burnice Logan, RN  06/11/2020 11:07 AM EDT Back to Top     Called patient, no answer, mailbox full, unable to leave message. No alternative number.    MyChart message to patient.

## 2020-06-12 ENCOUNTER — Ambulatory Visit: Payer: PPO | Admitting: Obstetrics and Gynecology

## 2020-06-12 ENCOUNTER — Other Ambulatory Visit: Payer: Self-pay

## 2020-06-12 ENCOUNTER — Encounter: Payer: Self-pay | Admitting: Obstetrics and Gynecology

## 2020-06-12 VITALS — BP 122/70 | HR 66 | Ht 67.0 in | Wt 225.0 lb

## 2020-06-12 DIAGNOSIS — G894 Chronic pain syndrome: Secondary | ICD-10-CM | POA: Diagnosis not present

## 2020-06-12 DIAGNOSIS — M545 Low back pain, unspecified: Secondary | ICD-10-CM | POA: Insufficient documentation

## 2020-06-12 DIAGNOSIS — Z79891 Long term (current) use of opiate analgesic: Secondary | ICD-10-CM | POA: Diagnosis not present

## 2020-06-12 DIAGNOSIS — G8929 Other chronic pain: Secondary | ICD-10-CM | POA: Insufficient documentation

## 2020-06-12 DIAGNOSIS — M961 Postlaminectomy syndrome, not elsewhere classified: Secondary | ICD-10-CM | POA: Diagnosis not present

## 2020-06-12 DIAGNOSIS — R7303 Prediabetes: Secondary | ICD-10-CM | POA: Diagnosis not present

## 2020-06-12 DIAGNOSIS — Z9889 Other specified postprocedural states: Secondary | ICD-10-CM

## 2020-06-12 DIAGNOSIS — N920 Excessive and frequent menstruation with regular cycle: Secondary | ICD-10-CM

## 2020-06-12 DIAGNOSIS — F909 Attention-deficit hyperactivity disorder, unspecified type: Secondary | ICD-10-CM | POA: Insufficient documentation

## 2020-06-12 DIAGNOSIS — E039 Hypothyroidism, unspecified: Secondary | ICD-10-CM | POA: Insufficient documentation

## 2020-06-12 DIAGNOSIS — M62830 Muscle spasm of back: Secondary | ICD-10-CM | POA: Diagnosis not present

## 2020-06-12 DIAGNOSIS — E038 Other specified hypothyroidism: Secondary | ICD-10-CM

## 2020-06-12 DIAGNOSIS — Z0000 Encounter for general adult medical examination without abnormal findings: Secondary | ICD-10-CM | POA: Insufficient documentation

## 2020-06-12 DIAGNOSIS — Q796 Ehlers-Danlos syndrome, unspecified: Secondary | ICD-10-CM | POA: Insufficient documentation

## 2020-06-12 DIAGNOSIS — G935 Compression of brain: Secondary | ICD-10-CM | POA: Insufficient documentation

## 2020-06-12 DIAGNOSIS — Z1379 Encounter for other screening for genetic and chromosomal anomalies: Secondary | ICD-10-CM | POA: Insufficient documentation

## 2020-06-12 DIAGNOSIS — J449 Chronic obstructive pulmonary disease, unspecified: Secondary | ICD-10-CM | POA: Insufficient documentation

## 2020-06-12 NOTE — H&P (View-Only) (Signed)
GYNECOLOGY  VISIT   HPI: 55 y.o.   Divorced White or Caucasian Not Hispanic or Latino  female   [phone] with No LMP recorded. Patient has had an ablation.   here to discuss options for management of her menorrhagia.   The patient has a h/o an endometrial ablation in 12/14. She has had cycles since the ablation, but has developed menorrhagia over time. She had a recent adjustment in her synthroid for a mildly elevated TSH.   In 2/22 she had an [place] of 36.9 and a Hgb of 12.7.   Ultrasound on 05/17/20 showed a normal sized uterus, endometrial stripe of 4.72 mm and normal adnexa bilaterally.  Sonohysterogram on 06/05/20 revealed minimal distention of the endometrial cavity with mild thickening posteriorly.   Endometrial biopsy returned with endometrial polyp.   At the time of that visit she was noted to have a small grade 2 cystocele and rectocele without significant uterine prolapse (examined supine and standing).  She has a h/o mixed incontinence, referral was placed on 06/05/20 to PT. Equally bad.   GYNECOLOGIC HISTORY: No LMP recorded. Patient has had an ablation. Contraception:vasectomy Menopausal hormone therapy: none        OB History    Gravida  2   Para  1   Term  1   Preterm      AB  1   Living  1     SAB      IAB      Ectopic  1   Multiple      Live Births                 Patient Active Problem List   Diagnosis Date Noted  . Attention deficit hyperactivity disorder 06/12/2020  . Chronic low back pain 06/12/2020  . Chronic obstructive pulmonary disease, unspecified ([place]) 06/12/2020  . Compression of brain ([place]) 06/12/2020  . Ehlers-Danlos syndrome, unspecified 06/12/2020  . Encounter for general adult medical examination without abnormal findings 06/12/2020  . Prediabetes   . Hypothyroid   . AVM (arteriovenous malformation) spine 04/21/2018  . Common migraine with intractable migraine 04/21/2018  . Other malaise and fatigue 03/28/2013  . History  of Chiari malformation 03/28/2013  . Hypersomnia, persistent 03/28/2013  . Cerebral venous sinus thrombosis, remote, resolved 03/28/2013  . Nystagmus, positional, central type 03/28/2013  . Menorrhagia 07/31/2011    Past Medical History:  Diagnosis Date  . ADHD (attention deficit hyperactivity disorder)   . Anxiety   . Arnold-Chiari deformity ([place])   . AVM (arteriovenous malformation) spine 04/21/2018  . Brain tumor ([place])   . Cerebral venous sinus thrombosis, remote, resolved 03/28/2013  . Chronic pain   . Common migraine with intractable migraine 04/21/2018  . EDS (Ehlers-Danlos syndrome)   . Eustachian tube dysfunction   . GERD (gastroesophageal reflux disease)   . Hemorrhoids   . HSV infection   . Hypothyroid   . Hypothyroidism   . IIH (idiopathic intracranial hypertension)   . Migraine   . Nystagmus, positional, central type 03/28/2013  . Obstructive sleep apnea   . Other malaise and fatigue 03/28/2013   " The patient reports feeling happy, ED, sore" on she has undergone at Chiari malformation surgery decompression in 2011. Prior to the surgery were chief complaints were weakness numbness and neck problems. She had swallowing difficulties and dizziness;  Classic Chiari presentation. But she didn't have headaches.  . Prediabetes   . Vitamin D deficiency     Past Surgical History:  Procedure Laterality Date  . ARNOLD CHIARI REPAIR    . Brain shunt    . BRAIN SURGERY    . BREAST BIOPSY Left   . BREAST EXCISIONAL BIOPSY Right   . CHOLECYSTECTOMY    . DILATION AND CURETTAGE OF UTERUS    . ENDOMETRIAL ABLATION  02/09/2013   [facility]Option  . INTRAUTERINE DEVICE INSERTION  10/2010   Mirena  . IUD REMOVAL  11/2010   could not tolerate hormonal side effects  . neck fusion c1-c3    . TONSILLECTOMY AND ADENOIDECTOMY      Current Outpatient Medications  Medication Sig Dispense Refill  . albuterol (VENTOLIN HFA) 108 (90 Base) MCG/ACT inhaler Inhale 1-2 puffs into the lungs every 4  (four) hours as needed for wheezing or shortness of breath.    . cyclobenzaprine (FLEXERIL) 5 MG tablet Take 5 mg by mouth 3 (three) times daily as needed for muscle spasms.    Marland Kitchen dexlansoprazole (DEXILANT) 60 MG capsule Take 60 mg by mouth at bedtime.    . lamoTRIgine (LAMICTAL) 25 MG tablet ONE BY MOUTH DAILY AND IN 2 WEEKS INCREASE TO 1 BY MOUTH TWICE A DAY    . levothyroxine (SYNTHROID) 150 MCG tablet Take 150 mcg by mouth daily before breakfast.    . metFORMIN (GLUCOPHAGE-XR) 500 MG 24 hr tablet Take 500 mg by mouth daily.    . norethindrone (MICRONOR) 0.35 MG tablet Take 1 tablet (0.35 mg total) by mouth daily. 84 tablet 0  . ondansetron (ZOFRAN) 4 MG tablet Take 1 tablet (4 mg total) by mouth every 8 (eight) hours as needed for nausea or vomiting. 30 tablet 0  . Oxycodone HCl 10 MG TABS Take 10 mg by mouth every 4 (four) hours as needed (pain.).     Marland Kitchen promethazine (PHENERGAN) 25 MG tablet Take 25 mg by mouth every 6 (six) hours as needed. Nausea    . valACYclovir (VALTREX) 1000 MG tablet Take 2 tabs (2 grams) po now and repeat in 12 hours prn coldsore. 20 tablet 1   No current facility-administered medications for this visit.     ALLERGIES: Other, Prednisone, and Morphine and related  Family History  Problem Relation Age of Onset  . Aortic dissection Father   . Diabetes Maternal Aunt   . Breast cancer Maternal Aunt        40's  . Diabetes Maternal Uncle   . Breast cancer Paternal Aunt 40  . Diabetes Maternal Grandmother   . Cancer Maternal Grandmother        SKIN AND LUNG  . Diabetes Paternal Grandmother   . Breast cancer Maternal Aunt        50's  . Breast cancer Maternal Aunt        50's    Social History   Socioeconomic History  . Marital status: Divorced    Spouse name: Not on file  . Number of children: 1  . Years of education: 58  . Highest education level: Not on file  Occupational History    Employer: OTHER  Tobacco Use  . Smoking status: Former Smoker     Quit date: 09/09/2007    Years since quitting: 12.7  . Smokeless tobacco: Never Used  Vaping Use  . Vaping Use: Never used  Substance and Sexual Activity  . Alcohol use: No  . Drug use: No  . Sexual activity: Yes    Partners: Male    Birth control/protection: Other-see comments    Comment: Vasectomy-1st intercourse 55  yo-More than 5 partners  Other Topics Concern  . Not on file  Social History Narrative   Patient is divorced and lives at home with her one child.   Patient is disabled.   Patient is right-handed.   Patient has a college education.   Patient drinks one cup of coffee daily.   Social Determinants of Health   Financial Resource Strain: Not on file  Food Insecurity: Not on file  Transportation Needs: Not on file  Physical Activity: Not on file  Stress: Not on file  Social Connections: Not on file  Intimate Partner Violence: Not on file    ROS  PHYSICAL EXAMINATION:    BP 122/70   Pulse 66   Ht 5\' 7"  (1.702 m)   Wt 225 lb (102.1 kg)   SpO2 99%   BMI 35.24 kg/m     General appearance: alert, cooperative and appears stated age Neck: no adenopathy, supple, symmetrical, trachea midline and thyroid normal to inspection and palpation Heart: regular rate and rhythm Lungs: CTAB Abdomen: soft, non-tender; bowel sounds normal; no masses,  no organomegaly Extremities: normal, atraumatic, no cyanosis Skin: normal color, texture and turgor, no rashes or lesions Lymph: normal cervical supraclavicular and inguinal nodes Neurologic: grossly normal   1. Menorrhagia with regular cycle Sonohysterogram with scarred cavity, some thickening posteriorly. Endometrial biopsy with polyp. We discussed the options of hysteroscopy, D&C. She also asked about hysterectomy. We discussed the benefits and downsides of each. She would like to schedule hysteroscopy. She also has a h/o mild genital prolapse and mixed incontinence, would need to decide on surgical repair of those issues if  she decided on hysterectomy.  Plan: hysteroscopy, polypectomy, dilation and curettage under ultrasound guidance. Reviewed risks, including: bleeding, infection, and uterine perforation She has been given a script for micronor, hasn't started it yet.    2. History of endometrial ablation Has a scarred endometrial cavity, not a candidate for a mirena IUD  3. Prediabetes  - Hemoglobin A1c  4. Other specified hypothyroidism Synthroid adjusted ~6 weeks ago.  - TSH  Over 30 minutes in total patient care.

## 2020-06-12 NOTE — Progress Notes (Signed)
GYNECOLOGY  VISIT   HPI: 55 y.o.   Divorced White or Caucasian Not Hispanic or Latino  female   [phone] with No LMP recorded. Patient has had an ablation.   here to discuss options for management of her menorrhagia.   The patient has a h/o an endometrial ablation in 12/14. She has had cycles since the ablation, but has developed menorrhagia over time. She had a recent adjustment in her synthroid for a mildly elevated TSH.   In 2/22 she had an [place] of 36.9 and a Hgb of 12.7.   Ultrasound on 05/17/20 showed a normal sized uterus, endometrial stripe of 4.72 mm and normal adnexa bilaterally.  Sonohysterogram on 06/05/20 revealed minimal distention of the endometrial cavity with mild thickening posteriorly.   Endometrial biopsy returned with endometrial polyp.   At the time of that visit she was noted to have a small grade 2 cystocele and rectocele without significant uterine prolapse (examined supine and standing).  She has a h/o mixed incontinence, referral was placed on 06/05/20 to PT. Equally bad.   GYNECOLOGIC HISTORY: No LMP recorded. Patient has had an ablation. Contraception:vasectomy Menopausal hormone therapy: none        OB History    Gravida  2   Para  1   Term  1   Preterm      AB  1   Living  1     SAB      IAB      Ectopic  1   Multiple      Live Births                 Patient Active Problem List   Diagnosis Date Noted  . Attention deficit hyperactivity disorder 06/12/2020  . Chronic low back pain 06/12/2020  . Chronic obstructive pulmonary disease, unspecified ([place]) 06/12/2020  . Compression of brain ([place]) 06/12/2020  . Ehlers-Danlos syndrome, unspecified 06/12/2020  . Encounter for general adult medical examination without abnormal findings 06/12/2020  . Prediabetes   . Hypothyroid   . AVM (arteriovenous malformation) spine 04/21/2018  . Common migraine with intractable migraine 04/21/2018  . Other malaise and fatigue 03/28/2013  . History  of Chiari malformation 03/28/2013  . Hypersomnia, persistent 03/28/2013  . Cerebral venous sinus thrombosis, remote, resolved 03/28/2013  . Nystagmus, positional, central type 03/28/2013  . Menorrhagia 07/31/2011    Past Medical History:  Diagnosis Date  . ADHD (attention deficit hyperactivity disorder)   . Anxiety   . Arnold-Chiari deformity ([place])   . AVM (arteriovenous malformation) spine 04/21/2018  . Brain tumor ([place])   . Cerebral venous sinus thrombosis, remote, resolved 03/28/2013  . Chronic pain   . Common migraine with intractable migraine 04/21/2018  . EDS (Ehlers-Danlos syndrome)   . Eustachian tube dysfunction   . GERD (gastroesophageal reflux disease)   . Hemorrhoids   . HSV infection   . Hypothyroid   . Hypothyroidism   . IIH (idiopathic intracranial hypertension)   . Migraine   . Nystagmus, positional, central type 03/28/2013  . Obstructive sleep apnea   . Other malaise and fatigue 03/28/2013   " The patient reports feeling happy, ED, sore" on she has undergone at Chiari malformation surgery decompression in 2011. Prior to the surgery were chief complaints were weakness numbness and neck problems. She had swallowing difficulties and dizziness;  Classic Chiari presentation. But she didn't have headaches.  . Prediabetes   . Vitamin D deficiency     Past Surgical History:  Procedure Laterality Date  . ARNOLD CHIARI REPAIR    . Brain shunt    . BRAIN SURGERY    . BREAST BIOPSY Left   . BREAST EXCISIONAL BIOPSY Right   . CHOLECYSTECTOMY    . DILATION AND CURETTAGE OF UTERUS    . ENDOMETRIAL ABLATION  02/09/2013   [facility]  . INTRAUTERINE DEVICE INSERTION  10/2010   Mirena  . IUD REMOVAL  11/2010   could not tolerate hormonal side effects  . neck fusion c1-c3    . TONSILLECTOMY AND ADENOIDECTOMY      Current Outpatient Medications  Medication Sig Dispense Refill  . albuterol (VENTOLIN HFA) 108 (90 Base) MCG/ACT inhaler Inhale 1-2 puffs into the lungs every 4  (four) hours as needed for wheezing or shortness of breath.    . cyclobenzaprine (FLEXERIL) 5 MG tablet Take 5 mg by mouth 3 (three) times daily as needed for muscle spasms.    Marland Kitchen dexlansoprazole (DEXILANT) 60 MG capsule Take 60 mg by mouth at bedtime.    . lamoTRIgine (LAMICTAL) 25 MG tablet ONE BY MOUTH DAILY AND IN 2 WEEKS INCREASE TO 1 BY MOUTH TWICE A DAY    . levothyroxine (SYNTHROID) 150 MCG tablet Take 150 mcg by mouth daily before breakfast.    . metFORMIN (GLUCOPHAGE-XR) 500 MG 24 hr tablet Take 500 mg by mouth daily.    . norethindrone (MICRONOR) 0.35 MG tablet Take 1 tablet (0.35 mg total) by mouth daily. 84 tablet 0  . ondansetron (ZOFRAN) 4 MG tablet Take 1 tablet (4 mg total) by mouth every 8 (eight) hours as needed for nausea or vomiting. 30 tablet 0  . Oxycodone HCl 10 MG TABS Take 10 mg by mouth every 4 (four) hours as needed (pain.).     Marland Kitchen promethazine (PHENERGAN) 25 MG tablet Take 25 mg by mouth every 6 (six) hours as needed. Nausea    . valACYclovir (VALTREX) 1000 MG tablet Take 2 tabs (2 grams) po now and repeat in 12 hours prn coldsore. 20 tablet 1   No current facility-administered medications for this visit.     ALLERGIES: Other, Prednisone, and Morphine and related  Family History  Problem Relation Age of Onset  . Aortic dissection Father   . Diabetes Maternal Aunt   . Breast cancer Maternal Aunt        40's  . Diabetes Maternal Uncle   . Breast cancer Paternal Aunt 80  . Diabetes Maternal Grandmother   . Cancer Maternal Grandmother        SKIN AND LUNG  . Diabetes Paternal Grandmother   . Breast cancer Maternal Aunt        50's  . Breast cancer Maternal Aunt        50's    Social History   Socioeconomic History  . Marital status: Divorced    Spouse name: Not on file  . Number of children: 1  . Years of education: 40  . Highest education level: Not on file  Occupational History    Employer: OTHER  Tobacco Use  . Smoking status: Former Smoker     Quit date: 09/09/2007    Years since quitting: 12.7  . Smokeless tobacco: Never Used  Vaping Use  . Vaping Use: Never used  Substance and Sexual Activity  . Alcohol use: No  . Drug use: No  . Sexual activity: Yes    Partners: Male    Birth control/protection: Other-see comments    Comment: Vasectomy-1st intercourse 55  yo-More than 5 partners  Other Topics Concern  . Not on file  Social History Narrative   Patient is divorced and lives at home with her one child.   Patient is disabled.   Patient is right-handed.   Patient has a college education.   Patient drinks one cup of coffee daily.   Social Determinants of Health   Financial Resource Strain: Not on file  Food Insecurity: Not on file  Transportation Needs: Not on file  Physical Activity: Not on file  Stress: Not on file  Social Connections: Not on file  Intimate Partner Violence: Not on file    ROS  PHYSICAL EXAMINATION:    BP 122/70   Pulse 66   Ht 5\' 7"  (1.702 m)   Wt 225 lb (102.1 kg)   SpO2 99%   BMI 35.24 kg/m     General appearance: alert, cooperative and appears stated age Neck: no adenopathy, supple, symmetrical, trachea midline and thyroid normal to inspection and palpation Heart: regular rate and rhythm Lungs: CTAB Abdomen: soft, non-tender; bowel sounds normal; no masses,  no organomegaly Extremities: normal, atraumatic, no cyanosis Skin: normal color, texture and turgor, no rashes or lesions Lymph: normal cervical supraclavicular and inguinal nodes Neurologic: grossly normal   1. Menorrhagia with regular cycle Sonohysterogram with scarred cavity, some thickening posteriorly. Endometrial biopsy with polyp. We discussed the options of hysteroscopy, D&C. She also asked about hysterectomy. We discussed the benefits and downsides of each. She would like to schedule hysteroscopy. She also has a h/o mild genital prolapse and mixed incontinence, would need to decide on surgical repair of those issues if  she decided on hysterectomy.  Plan: hysteroscopy, polypectomy, dilation and curettage under ultrasound guidance. Reviewed risks, including: bleeding, infection, and uterine perforation She has been given a script for micronor, hasn't started it yet.    2. History of endometrial ablation Has a scarred endometrial cavity, not a candidate for a mirena IUD  3. Prediabetes  - Hemoglobin A1c  4. Other specified hypothyroidism Synthroid adjusted ~6 weeks ago.  - TSH  Over 30 minutes in total patient care.

## 2020-06-13 ENCOUNTER — Telehealth: Payer: Self-pay | Admitting: *Deleted

## 2020-06-13 ENCOUNTER — Other Ambulatory Visit: Payer: Self-pay | Admitting: Obstetrics and Gynecology

## 2020-06-13 ENCOUNTER — Other Ambulatory Visit (HOSPITAL_COMMUNITY): Payer: Self-pay | Admitting: Obstetrics and Gynecology

## 2020-06-13 DIAGNOSIS — N920 Excessive and frequent menstruation with regular cycle: Secondary | ICD-10-CM

## 2020-06-13 LAB — HEMOGLOBIN A1C
Hgb A1c MFr Bld: 5.6 % of total Hgb (ref <5.7)
Mean Plasma Glucose: 114 mg/dL
eAG (mmol/L): 6.3 mmol/L

## 2020-06-13 LAB — TSH: TSH: 0.3 mIU/L — ABNORMAL LOW

## 2020-06-13 NOTE — Telephone Encounter (Signed)
Spoke with patient. Surgery date request confirmed.  Advised surgery is scheduled for 06/26/20 at 60, [facility]. Surgery instruction sheet and hospital brochure reviewed, printed copy will be mailed. Patient advised of Covid screening and quarantine requirements and agreeable.    Routing to [employer].

## 2020-06-13 NOTE — Telephone Encounter (Signed)
Spoke with patient regarding surgery benefits. Patient acknowledges understanding of information presented. Patient is aware that benefits presented are professional benefits only. Patient is aware that once surgery is scheduled, the hospital will call with separate benefits. See account note.  Routing to Glorianne Manchester, Therapist, sports

## 2020-06-13 NOTE — Telephone Encounter (Signed)
Call to patient. Per DPR, OK to leave message on voicemail.   Left voicemail requesting a return call to [facility] to review benefits for scheduled surgery with Sumner Boast, MD.

## 2020-06-18 DIAGNOSIS — M48061 Spinal stenosis, lumbar region without neurogenic claudication: Secondary | ICD-10-CM | POA: Diagnosis not present

## 2020-06-19 ENCOUNTER — Encounter (HOSPITAL_BASED_OUTPATIENT_CLINIC_OR_DEPARTMENT_OTHER): Payer: Self-pay | Admitting: Obstetrics and Gynecology

## 2020-06-19 ENCOUNTER — Encounter: Payer: Self-pay | Admitting: Obstetrics and Gynecology

## 2020-06-19 ENCOUNTER — Other Ambulatory Visit: Payer: Self-pay

## 2020-06-19 DIAGNOSIS — G932 Benign intracranial hypertension: Secondary | ICD-10-CM | POA: Diagnosis not present

## 2020-06-19 DIAGNOSIS — Z4541 Encounter for adjustment and management of cerebrospinal fluid drainage device: Secondary | ICD-10-CM | POA: Diagnosis not present

## 2020-06-19 DIAGNOSIS — Z982 Presence of cerebrospinal fluid drainage device: Secondary | ICD-10-CM | POA: Insufficient documentation

## 2020-06-19 NOTE — Progress Notes (Addendum)
Spoke w/ via phone for pre-op interview---pt Lab needs dos----I stat, ekg urine poct               Lab results------lov neurology 01-18-2021 jennifer aldridge np care everywhere/chart COVID test ------06-22-2020 1420 Arrive at -------830 am 06-26-2020 NPO after MN NO Solid Food.  Clear liquids from MN until---730 am then npo Med rec completed Medications to take morning of surgery -----albuterol inhaler prn/bring inhaler, lamictal, synthroid, oxycodone prn, claritin Diabetic medication -----none day of surgery Patient instructed to bring photo id and insurance card day of surgery Patient aware to have Driver (ride ) / caregiver   Driver janet blakely boss/friend/caregiver mark ross friend  for 24 hours after surgery  Patient Special Instructions -----none Pre-Op special Istructions -----none Patient verbalized understanding of instructions that were given at this phone interview. Patient denies shortness of breath, chest pain, fever, cough at this phone interview.  Reviewed patient chart and history with ernest oddono mda, pt ok for [facility] barring any acute status chnages per dr Jaquelyn Bitter oddono mda.

## 2020-06-22 ENCOUNTER — Other Ambulatory Visit (HOSPITAL_COMMUNITY)
Admission: RE | Admit: 2020-06-22 | Discharge: 2020-06-22 | Disposition: A | Discharge status: Home / Self Care | Payer: PPO | Source: Ambulatory Visit | Attending: Obstetrics and Gynecology | Admitting: Obstetrics and Gynecology

## 2020-06-22 DIAGNOSIS — D259 Leiomyoma of uterus, unspecified: Secondary | ICD-10-CM | POA: Diagnosis not present

## 2020-06-22 DIAGNOSIS — Z01812 Encounter for preprocedural laboratory examination: Secondary | ICD-10-CM | POA: Diagnosis not present

## 2020-06-22 DIAGNOSIS — N83299 Other ovarian cyst, unspecified side: Secondary | ICD-10-CM | POA: Diagnosis not present

## 2020-06-22 DIAGNOSIS — E559 Vitamin D deficiency, unspecified: Secondary | ICD-10-CM | POA: Diagnosis not present

## 2020-06-22 DIAGNOSIS — Z20822 Contact with and (suspected) exposure to covid-19: Secondary | ICD-10-CM | POA: Diagnosis not present

## 2020-06-22 DIAGNOSIS — Q796 Ehlers-Danlos syndrome, unspecified: Secondary | ICD-10-CM | POA: Diagnosis not present

## 2020-06-22 DIAGNOSIS — M6281 Muscle weakness (generalized): Secondary | ICD-10-CM | POA: Diagnosis not present

## 2020-06-22 LAB — SARS CORONAVIRUS 2 (TAT 6-24 HRS): SARS Coronavirus 2: NEGATIVE

## 2020-06-26 ENCOUNTER — Ambulatory Visit (HOSPITAL_COMMUNITY)
Admission: RE | Admit: 2020-06-26 | Discharge: 2020-06-26 | Disposition: A | Discharge status: Home / Self Care | Payer: PPO | Source: Ambulatory Visit | Attending: Obstetrics and Gynecology | Admitting: Obstetrics and Gynecology

## 2020-06-26 ENCOUNTER — Other Ambulatory Visit: Payer: Self-pay

## 2020-06-26 ENCOUNTER — Ambulatory Visit (HOSPITAL_BASED_OUTPATIENT_CLINIC_OR_DEPARTMENT_OTHER)
Admission: RE | Admit: 2020-06-26 | Discharge: 2020-06-26 | Disposition: A | Discharge status: Home / Self Care | Payer: PPO | Attending: Obstetrics and Gynecology | Admitting: Obstetrics and Gynecology

## 2020-06-26 ENCOUNTER — Ambulatory Visit (HOSPITAL_BASED_OUTPATIENT_CLINIC_OR_DEPARTMENT_OTHER): Payer: PPO | Admitting: Certified Registered"

## 2020-06-26 ENCOUNTER — Encounter (HOSPITAL_BASED_OUTPATIENT_CLINIC_OR_DEPARTMENT_OTHER)
Admission: RE | Disposition: A | Discharge status: Home / Self Care | Payer: Self-pay | Source: Home / Self Care | Attending: Obstetrics and Gynecology

## 2020-06-26 ENCOUNTER — Encounter (HOSPITAL_BASED_OUTPATIENT_CLINIC_OR_DEPARTMENT_OTHER): Payer: Self-pay | Admitting: Obstetrics and Gynecology

## 2020-06-26 DIAGNOSIS — G4733 Obstructive sleep apnea (adult) (pediatric): Secondary | ICD-10-CM | POA: Insufficient documentation

## 2020-06-26 DIAGNOSIS — Z9889 Other specified postprocedural states: Secondary | ICD-10-CM

## 2020-06-26 DIAGNOSIS — Z888 Allergy status to other drugs, medicaments and biological substances status: Secondary | ICD-10-CM | POA: Diagnosis not present

## 2020-06-26 DIAGNOSIS — N920 Excessive and frequent menstruation with regular cycle: Secondary | ICD-10-CM | POA: Diagnosis not present

## 2020-06-26 DIAGNOSIS — Z833 Family history of diabetes mellitus: Secondary | ICD-10-CM | POA: Diagnosis not present

## 2020-06-26 DIAGNOSIS — Z885 Allergy status to narcotic agent status: Secondary | ICD-10-CM | POA: Insufficient documentation

## 2020-06-26 DIAGNOSIS — Z801 Family history of malignant neoplasm of trachea, bronchus and lung: Secondary | ICD-10-CM | POA: Diagnosis not present

## 2020-06-26 DIAGNOSIS — N840 Polyp of corpus uteri: Secondary | ICD-10-CM | POA: Insufficient documentation

## 2020-06-26 DIAGNOSIS — N950 Postmenopausal bleeding: Secondary | ICD-10-CM

## 2020-06-26 DIAGNOSIS — Z9049 Acquired absence of other specified parts of digestive tract: Secondary | ICD-10-CM | POA: Diagnosis not present

## 2020-06-26 DIAGNOSIS — Z79890 Hormone replacement therapy (postmenopausal): Secondary | ICD-10-CM | POA: Insufficient documentation

## 2020-06-26 DIAGNOSIS — D259 Leiomyoma of uterus, unspecified: Secondary | ICD-10-CM | POA: Diagnosis not present

## 2020-06-26 DIAGNOSIS — R7303 Prediabetes: Secondary | ICD-10-CM | POA: Diagnosis not present

## 2020-06-26 DIAGNOSIS — Z87891 Personal history of nicotine dependence: Secondary | ICD-10-CM | POA: Insufficient documentation

## 2020-06-26 DIAGNOSIS — Z803 Family history of malignant neoplasm of breast: Secondary | ICD-10-CM | POA: Diagnosis not present

## 2020-06-26 DIAGNOSIS — E038 Other specified hypothyroidism: Secondary | ICD-10-CM | POA: Insufficient documentation

## 2020-06-26 DIAGNOSIS — Z79899 Other long term (current) drug therapy: Secondary | ICD-10-CM | POA: Insufficient documentation

## 2020-06-26 DIAGNOSIS — Z7984 Long term (current) use of oral hypoglycemic drugs: Secondary | ICD-10-CM | POA: Insufficient documentation

## 2020-06-26 DIAGNOSIS — J449 Chronic obstructive pulmonary disease, unspecified: Secondary | ICD-10-CM | POA: Diagnosis not present

## 2020-06-26 DIAGNOSIS — E039 Hypothyroidism, unspecified: Secondary | ICD-10-CM | POA: Diagnosis not present

## 2020-06-26 HISTORY — DX: Other skin changes: R23.8

## 2020-06-26 HISTORY — DX: Torticollis: M43.6

## 2020-06-26 HISTORY — DX: Unspecified visual disturbance: H53.9

## 2020-06-26 HISTORY — DX: Spontaneous ecchymoses: R23.3

## 2020-06-26 HISTORY — DX: Other complications of anesthesia, initial encounter: T88.59XA

## 2020-06-26 HISTORY — DX: Other fatigue: R53.83

## 2020-06-26 HISTORY — PX: HYSTEROSCOPY WITH D & C: SHX1775

## 2020-06-26 HISTORY — DX: Tinnitus, bilateral: H93.13

## 2020-06-26 HISTORY — DX: Presence of spectacles and contact lenses: Z97.3

## 2020-06-26 HISTORY — DX: Malignant neoplasm of brain, unspecified: C71.9

## 2020-06-26 HISTORY — DX: Prediabetes: R73.03

## 2020-06-26 HISTORY — DX: Weakness: R53.1

## 2020-06-26 HISTORY — DX: Unspecified urinary incontinence: R32

## 2020-06-26 HISTORY — DX: Unspecified asthma, uncomplicated: J45.909

## 2020-06-26 HISTORY — PX: OPERATIVE ULTRASOUND: SHX5996

## 2020-06-26 HISTORY — DX: Other intervertebral disc displacement, lumbar region: M51.26

## 2020-06-26 HISTORY — DX: Personal history of other diseases of the nervous system and sense organs: Z86.69

## 2020-06-26 HISTORY — DX: Visual discomfort, unspecified: H53.149

## 2020-06-26 LAB — POCT PREGNANCY, URINE: Preg Test, Ur: NEGATIVE

## 2020-06-26 LAB — POCT I-STAT, CHEM 8
BUN: 10 mg/dL (ref 6–20)
Calcium, Ion: 1.27 mmol/L (ref 1.15–1.40)
Chloride: 102 mmol/L (ref 98–111)
Creatinine, Ser: 0.7 mg/dL (ref 0.44–1.00)
Glucose, Bld: 87 mg/dL (ref 70–99)
HCT: 36 % (ref 36.0–46.0)
Hemoglobin: 12.2 g/dL (ref 12.0–15.0)
Potassium: 3.9 mmol/L (ref 3.5–5.1)
Sodium: 143 mmol/L (ref 135–145)
TCO2: 26 mmol/L (ref 22–32)

## 2020-06-26 SURGERY — DILATATION AND CURETTAGE /HYSTEROSCOPY
Anesthesia: General | Site: Uterus

## 2020-06-26 MED ORDER — LACTATED RINGERS IV SOLN: INTRAVENOUS | Status: DC

## 2020-06-26 MED ORDER — KETOROLAC TROMETHAMINE 30 MG/ML IJ SOLN
INTRAMUSCULAR | Status: AC
  Filled 2020-06-26: qty 1

## 2020-06-26 MED ORDER — MIDAZOLAM HCL 2 MG/2ML IJ SOLN
INTRAMUSCULAR | Status: AC
  Filled 2020-06-26: qty 2

## 2020-06-26 MED ORDER — ONDANSETRON HCL 4 MG/2ML IJ SOLN
INTRAMUSCULAR | Status: DC | PRN
  Administered 2020-06-26: 4 mg via INTRAVENOUS

## 2020-06-26 MED ORDER — PROMETHAZINE HCL 25 MG/ML IJ SOLN: 6.2500 mg | INTRAMUSCULAR | Status: DC | PRN

## 2020-06-26 MED ORDER — SODIUM CHLORIDE 0.9 % IV SOLN
INTRAVENOUS | Status: AC | PRN
  Administered 2020-06-26: 3000 mL

## 2020-06-26 MED ORDER — MIDAZOLAM HCL 2 MG/2ML IJ SOLN
INTRAMUSCULAR | Status: DC | PRN
  Administered 2020-06-26: 2 mg via INTRAVENOUS

## 2020-06-26 MED ORDER — WHITE PETROLATUM EX OINT
TOPICAL_OINTMENT | CUTANEOUS | Status: AC
  Filled 2020-06-26: qty 5

## 2020-06-26 MED ORDER — OXYCODONE HCL 5 MG PO TABS: 5.0000 mg | ORAL_TABLET | Freq: Once | ORAL | Status: DC | PRN

## 2020-06-26 MED ORDER — FENTANYL CITRATE (PF) 100 MCG/2ML IJ SOLN: 25.0000 ug | INTRAMUSCULAR | Status: DC | PRN

## 2020-06-26 MED ORDER — PROPOFOL 10 MG/ML IV BOLUS
INTRAVENOUS | Status: DC | PRN
  Administered 2020-06-26: 170 mg via INTRAVENOUS

## 2020-06-26 MED ORDER — FENTANYL CITRATE (PF) 100 MCG/2ML IJ SOLN
INTRAMUSCULAR | Status: DC | PRN
  Administered 2020-06-26: 25 ug via INTRAVENOUS
  Administered 2020-06-26: 50 ug via INTRAVENOUS
  Administered 2020-06-26: 25 ug via INTRAVENOUS

## 2020-06-26 MED ORDER — ONDANSETRON HCL 4 MG/2ML IJ SOLN
INTRAMUSCULAR | Status: AC
  Filled 2020-06-26: qty 2

## 2020-06-26 MED ORDER — ACETAMINOPHEN 500 MG PO TABS
1000.0000 mg | ORAL_TABLET | ORAL | Status: AC
  Administered 2020-06-26: 1000 mg via ORAL

## 2020-06-26 MED ORDER — GLYCOPYRROLATE PF 0.2 MG/ML IJ SOSY
PREFILLED_SYRINGE | INTRAMUSCULAR | Status: AC
  Filled 2020-06-26: qty 1

## 2020-06-26 MED ORDER — KETOROLAC TROMETHAMINE 30 MG/ML IJ SOLN
INTRAMUSCULAR | Status: DC | PRN
  Administered 2020-06-26: 30 mg via INTRAVENOUS

## 2020-06-26 MED ORDER — ACETAMINOPHEN 500 MG PO TABS
ORAL_TABLET | ORAL | Status: AC
  Filled 2020-06-26: qty 2

## 2020-06-26 MED ORDER — LIDOCAINE 2% (20 MG/ML) 5 ML SYRINGE
INTRAMUSCULAR | Status: DC | PRN
  Administered 2020-06-26: 80 mg via INTRAVENOUS

## 2020-06-26 MED ORDER — FENTANYL CITRATE (PF) 100 MCG/2ML IJ SOLN
INTRAMUSCULAR | Status: AC
  Filled 2020-06-26: qty 2

## 2020-06-26 MED ORDER — LIDOCAINE 2% (20 MG/ML) 5 ML SYRINGE
INTRAMUSCULAR | Status: AC
  Filled 2020-06-26: qty 5

## 2020-06-26 MED ORDER — GLYCOPYRROLATE PF 0.2 MG/ML IJ SOSY
PREFILLED_SYRINGE | INTRAMUSCULAR | Status: DC | PRN
  Administered 2020-06-26: .2 mg via INTRAVENOUS

## 2020-06-26 MED ORDER — OXYCODONE HCL 5 MG/5ML PO SOLN
5.0000 mg | Freq: Once | ORAL | Status: DC | PRN
Start: 2020-06-26 — End: 2020-06-26

## 2020-06-26 SURGICAL SUPPLY — 21 items
CATH ROBINSON RED A/P 16FR (CATHETERS) IMPLANT
COVER WAND RF STERILE (DRAPES) ×2 IMPLANT
DEVICE MYOSURE LITE (MISCELLANEOUS) IMPLANT
DEVICE MYOSURE REACH (MISCELLANEOUS) ×1 IMPLANT
DILATOR CANAL MILEX (MISCELLANEOUS) IMPLANT
GAUZE 4X4 16PLY RFD (DISPOSABLE) ×2 IMPLANT
GLOVE SURG ENC MOIS LTX SZ6.5 (GLOVE) ×2 IMPLANT
GOWN STRL REUS W/TWL LRG LVL3 (GOWN DISPOSABLE) ×2 IMPLANT
IV NS IRRIG 3000ML ARTHROMATIC (IV SOLUTION) ×2 IMPLANT
KIT PROCEDURE FLUENT (KITS) ×2 IMPLANT
KIT TURNOVER CYSTO (KITS) ×2 IMPLANT
MYOSURE XL FIBROID (MISCELLANEOUS)
PACK VAGINAL MINOR WOMEN LF (CUSTOM PROCEDURE TRAY) ×2 IMPLANT
PAD OB MATERNITY 4.3X12.25 (PERSONAL CARE ITEMS) ×2 IMPLANT
PAD PREP 24X48 CUFFED NSTRL (MISCELLANEOUS) ×2 IMPLANT
SEAL CERVICAL OMNI LOK (ABLATOR) IMPLANT
SEAL ROD LENS SCOPE MYOSURE (ABLATOR) ×2 IMPLANT
SYR 20ML LL LF (SYRINGE) IMPLANT
SYSTEM TISS REMOVAL MYOSURE XL (MISCELLANEOUS) IMPLANT
TOWEL OR 17X26 10 PK STRL BLUE (TOWEL DISPOSABLE) ×2 IMPLANT
WATER STERILE IRR 500ML POUR (IV SOLUTION) IMPLANT

## 2020-06-26 NOTE — Anesthesia Preprocedure Evaluation (Signed)
Anesthesia Evaluation  Patient identified by MRN, date of birth, ID band Patient awake    Reviewed: Allergy & Precautions, NPO status , Patient's Chart, lab work & pertinent test results  Airway Mallampati: II  TM Distance: >3 FB Neck ROM: Limited    Dental  (+) Teeth Intact   Pulmonary asthma , COPD,  COPD inhaler, former smoker,    Pulmonary exam normal        Cardiovascular  Rhythm:Regular Rate:Normal     Neuro/Psych  Headaches, Anxiety AVM 02/20, Arnold Chiari Malformation repair (2010), shunt 10/21    GI/Hepatic GERD  Medicated,  Endo/Other  diabetes, Type 2, Oral Hypoglycemic AgentsHypothyroidism   Renal/GU   Female GU complaint Endometrial polyp, menorrhagia    Musculoskeletal  (+) Arthritis , Osteoarthritis,  Cervical fusion   Abdominal (+)  Abdomen: soft. Bowel sounds: normal.  Peds  (+) ADHD Hematology negative hematology ROS (+)   Anesthesia Other Findings   Reproductive/Obstetrics                             Anesthesia Physical Anesthesia Plan  ASA: III  Anesthesia Plan: General   Post-op Pain Management:    Induction: Intravenous  PONV Risk Score and Plan: 3 and Ondansetron, Dexamethasone, Midazolam and Treatment may vary due to age or medical condition  Airway Management Planned: Mask and LMA  Additional Equipment: None  Intra-op Plan:   Post-operative Plan: Extubation in OR  Informed Consent: I have reviewed the patients History and Physical, chart, labs and discussed the procedure including the risks, benefits and alternatives for the proposed anesthesia with the patient or authorized representative who has indicated his/her understanding and acceptance.     Dental advisory given  Plan Discussed with: CRNA  Anesthesia Plan Comments: (Lab Results      Component                Value               Date                      WBC                      6.4                  05/01/2020                HGB                      12.2                06/26/2020                HCT                      36.0                06/26/2020                MCV                      92.9                05/01/2020                PLT  220                 05/01/2020           Lab Results      Component                Value               Date                      NA                       143                 06/26/2020                K                        3.9                 06/26/2020                CO2                      25                  01/02/2019                GLUCOSE                  87                  06/26/2020                BUN                      10                  06/26/2020                CREATININE               0.70                06/26/2020                CALCIUM                  8.2 (L)             01/02/2019                GFRNONAA                 >60                 01/02/2019                GFRAA                    >60                 01/02/2019          )        Anesthesia Quick Evaluation

## 2020-06-26 NOTE — Op Note (Signed)
Preoperative Diagnosis: menorrhagia, history of endometrial ablation, endometrial polyp  Postoperative Diagnosis: menorrhagia, history of endometrial ablation, scarred uterine cavity  Procedure: Hysteroscopy, dilation and curettage under ultrasound guidance  Surgeon: Dr Sumner Boast  Assistants: None  Anesthesia: General via LMA  EBL: 5 cc  Fluids: 600 cc LR  Fluid deficit: 50 cc  Urine output: not recorded  Indications for surgery: The patient is a 55 yo female, who presented with menorrhagia and a history of an endometrial ablation. Sonohysterogram with a scarred uterine cavity with some thickening posteriorly. Endometrial biopsy returned with endometrial polyp.  The risks of the surgery were reviewed with the patient and the consent form was signed prior to her surgery.  Findings: EUA: normal sized anteverted uterus, no adnexal masses. Hysteroscopy with ultrasound guidance: scarred uterine cavity, no clear endometrial tissue, no ostia seen. Remained in the center of the cavity and what appeared to be the endometrial stripe on ultrasound.  With traction on the uterus she had a grade 2 uterine prolapse, the cervix came to within 1 cm of the introitus. Small grade 2 cystocele, rectocele not as obvious.   Specimens: endometrial curetting's.    Procedure: The patient was taken to the operating room with an IV in place. She was placed in the dorsal lithotomy position and anesthesia was administered. She was prepped and draped in the usual sterile fashion for a vaginal procedure. A speculum was placed in the vagina and a single tooth tenaculum was placed on the anterior lip of the cervix. The speculum was removed, with traction the cervix was visible at the introitus. Using ultrasound guidance, the cervix was dilated to a #7 hagar dilator. The myosure hysteroscope was inserted into the uterine cavity. With continuous infusion of normal saline, the uterine cavity was visualized with the above  findings. The myosure reach was used to sample the uterine lining while carefully observing with the ultrasound. The myosure was then removed. The cavity was then curetted with the small sharp curette. The cavity had the characteristically gritty texture at the end of the procedure. The curette and the single tooth tenaculum were removed. The patients perineum was cleansed of betadine and she was taken out of the dorsal lithotomy position.  Upon awakening the LMA was removed and the patient was transferred to the recovery room in stable and awake condition.  The sponge and instrument count were correct. There were no complications.

## 2020-06-26 NOTE — Interval H&P Note (Signed)
History and Physical Interval Note:  06/26/2020 10:43 AM  [name]  has presented today for surgery, with the diagnosis of Menorrhagia with regular cycle, endometrial polyp.  The various methods of treatment have been discussed with the patient and family. After consideration of risks, benefits and other options for treatment, the patient has consented to  Procedure(s): DILATATION AND CURETTAGE /HYSTEROSCOPY (N/A) OPERATIVE ULTRASOUND (N/A) as a surgical intervention.  The patient's history has been reviewed, patient examined, no change in status, stable for surgery.  I have reviewed the patient's chart and labs.  Questions were answered to the patient's satisfaction.     April Meyer

## 2020-06-26 NOTE — Transfer of Care (Signed)
Immediate Anesthesia Transfer of Care Note  Patient: April Meyer  Procedure(s) Performed: Procedure(s) (LRB): DILATATION AND CURETTAGE /HYSTEROSCOPY (N/A) OPERATIVE ULTRASOUND (N/A)  Patient Location: PACU  Anesthesia Type: General  Level of Consciousness: awake, oriented, sedated and patient cooperative  Airway & Oxygen Therapy: Patient Spontanous Breathing and Patient connected to face mask oxygen  Post-op Assessment: Report given to PACU RN and Post -op Vital signs reviewed and stable  Post vital signs: Reviewed and stable  Complications: No apparent anesthesia complications Last Vitals:  Vitals Value Taken Time  BP 128/76 06/26/20 1130  Temp    Pulse 56 06/26/20 1132  Resp 12 06/26/20 1132  SpO2 99 % 06/26/20 1132  Vitals shown include unvalidated device data.  Last Pain:  Vitals:   06/26/20 0859  TempSrc: Oral  PainSc: 0-No pain         Complications: No complications documented.

## 2020-06-26 NOTE — Anesthesia Procedure Notes (Signed)
Procedure Name: LMA Insertion Date/Time: 06/26/2020 10:54 AM Performed by: Suan Halter, CRNA Pre-anesthesia Checklist: Patient identified, Emergency Drugs available, Suction available and Patient being monitored Patient Re-evaluated:Patient Re-evaluated prior to induction Oxygen Delivery Method: Circle system utilized Preoxygenation: Pre-oxygenation with 100% oxygen Induction Type: IV induction Ventilation: Mask ventilation without difficulty LMA: LMA inserted LMA Size: 4.0 Number of attempts: 1 Airway Equipment and Method: Bite block Placement Confirmation: positive ETCO2 Tube secured with: Tape Dental Injury: Teeth and Oropharynx as per pre-operative assessment

## 2020-06-26 NOTE — Discharge Instructions (Signed)
  Post Anesthesia Home Care Instructions  Activity: Get plenty of rest for the remainder of the day. A responsible individual must stay with you for 24 hours following the procedure.  For the next 24 hours, DO NOT: -Drive a car -Paediatric nurse -Drink alcoholic beverages -Take any medication unless instructed by your physician -Make any legal decisions or sign important papers.  Meals: Start with liquid foods such as gelatin or soup. Progress to regular foods as tolerated. Avoid greasy, spicy, heavy foods. If nausea and/or vomiting occur, drink only clear liquids until the nausea and/or vomiting subsides. Call your physician if vomiting continues.  Special Instructions/Symptoms: Your throat may feel dry or sore from the anesthesia or the breathing tube placed in your throat during surgery. If this causes discomfort, gargle with warm salt water. The discomfort should disappear within 24 hours.  If you had a scopolamine patch placed behind your ear for the management of post- operative nausea and/or vomiting:  1. The medication in the patch is effective for 72 hours, after which it should be removed.  Wrap patch in a tissue and discard in the trash. Wash hands thoroughly with soap and water. 2. You may remove the patch earlier than 72 hours if you experience unpleasant side effects which may include dry mouth, dizziness or visual disturbances. 3. Avoid touching the patch. Wash your hands with soap and water after contact with the patch.    No ibuprofen, Advil, Aleve, Motrin, or naproxen until after 5:15 pm today if needed. No Tylenol until after 3 pm today if needed.   DISCHARGE INSTRUCTIONS: D&C / D&E The following instructions have been prepared to help you care for yourself upon your return home.   Personal hygiene: Marland Kitchen Use sanitary pads for vaginal drainage, not tampons. . Shower the day after your procedure. . NO tub baths, pools or Jacuzzis for 2-3 weeks. . Wipe front to back  after using the bathroom.  Activity and limitations: . Do NOT drive or operate any equipment for 24 hours. The effects of anesthesia are still present and drowsiness may result. . Do NOT rest in bed all day. . Walking is encouraged. . Walk up and down stairs slowly. . You may resume your normal activity in one to two days or as indicated by your physician.  Sexual activity: NO intercourse for at least 2 weeks after the procedure, or as indicated by your physician.  Diet: Eat a light meal as desired this evening. You may resume your usual diet tomorrow.  Return to work: You may resume your work activities in one to two days or as indicated by your doctor.  What to expect after your surgery: Expect to have vaginal bleeding/discharge for 2-3 days and spotting for up to 10 days. It is not unusual to have soreness for up to 1-2 weeks. You may have a slight burning sensation when you urinate for the first day. Mild cramps may continue for a couple of days. You may have a regular period in 2-6 weeks.  Call your doctor for any of the following: . Excessive vaginal bleeding, saturating and changing one pad every hour. . Inability to urinate 6 hours after discharge from hospital. . Pain not relieved by pain medication. . Fever of 100.4 F or greater. . Unusual vaginal discharge or odor.

## 2020-06-26 NOTE — Anesthesia Postprocedure Evaluation (Signed)
Anesthesia Post Note  Patient: Taneka Nathen May  Procedure(s) Performed: DILATATION AND CURETTAGE /HYSTEROSCOPY (N/A Uterus) OPERATIVE ULTRASOUND (N/A Uterus)     Patient location during evaluation: PACU Anesthesia Type: General Level of consciousness: awake and alert Pain management: pain level controlled Vital Signs Assessment: post-procedure vital signs reviewed and stable Respiratory status: spontaneous breathing, nonlabored ventilation, respiratory function stable and patient connected to nasal cannula oxygen Cardiovascular status: blood pressure returned to baseline and stable Postop Assessment: no apparent nausea or vomiting Anesthetic complications: no   No complications documented.  Last Vitals:  Vitals:   06/26/20 1155 06/26/20 1216  BP: 131/72 128/82  Pulse: (!) 50 (!) 50  Resp: 14 14  Temp: (!) 36.4 C 36.5 C  SpO2: 96% 100%    Last Pain:  Vitals:   06/26/20 1216  TempSrc:   PainSc: 0-No pain                 Belenda Cruise P Raileigh Sabater

## 2020-06-27 ENCOUNTER — Encounter (HOSPITAL_BASED_OUTPATIENT_CLINIC_OR_DEPARTMENT_OTHER): Payer: Self-pay | Admitting: Obstetrics and Gynecology

## 2020-06-27 LAB — SURGICAL PATHOLOGY

## 2020-07-03 DIAGNOSIS — Q796 Ehlers-Danlos syndrome, unspecified: Secondary | ICD-10-CM | POA: Diagnosis not present

## 2020-07-03 DIAGNOSIS — R351 Nocturia: Secondary | ICD-10-CM | POA: Diagnosis not present

## 2020-07-03 DIAGNOSIS — N3946 Mixed incontinence: Secondary | ICD-10-CM | POA: Diagnosis not present

## 2020-07-03 NOTE — Telephone Encounter (Signed)
Patient scheduled on 07/03/20 @ 9:00am

## 2020-07-09 ENCOUNTER — Other Ambulatory Visit: Payer: Self-pay

## 2020-07-09 ENCOUNTER — Ambulatory Visit: Payer: Self-pay | Admitting: Obstetrics and Gynecology

## 2020-07-09 ENCOUNTER — Encounter: Payer: Self-pay | Admitting: Obstetrics and Gynecology

## 2020-07-09 ENCOUNTER — Ambulatory Visit (INDEPENDENT_AMBULATORY_CARE_PROVIDER_SITE_OTHER): Payer: PPO | Admitting: Obstetrics and Gynecology

## 2020-07-09 VITALS — BP 110/82 | HR 74 | Wt 215.0 lb

## 2020-07-09 DIAGNOSIS — Z8742 Personal history of other diseases of the female genital tract: Secondary | ICD-10-CM

## 2020-07-09 DIAGNOSIS — Z9889 Other specified postprocedural states: Secondary | ICD-10-CM

## 2020-07-09 DIAGNOSIS — R109 Unspecified abdominal pain: Secondary | ICD-10-CM

## 2020-07-09 DIAGNOSIS — R102 Pelvic and perineal pain: Secondary | ICD-10-CM

## 2020-07-09 NOTE — Progress Notes (Signed)
GYNECOLOGY  VISIT   HPI: 55 y.o.   Divorced White or Caucasian Not Hispanic or Latino  female   [phone] with No LMP recorded. (Menstrual status: Perimenopausal).   here for 2 week post op follow D&C and Hysteroscopy.   Hysteroscopy was performed under ultrasound guidance given the patient's history of endometrial ablation. The cavity was very scared. Endometrial curettage returned with inactive endometrium and some smooth muscle.    No issues since her surgery. She is still getting intermittent pain in her RLQ, not sure if it is the cyst she was noted to have in 4/22 or from her shunt. Last episode of pain was 3 days ago.   She has a script for the mini pill, hasn't started it yet.   GYNECOLOGIC HISTORY: No LMP recorded. (Menstrual status: Perimenopausal). Contraception: surgical Menopausal hormone therapy: none         OB History    Gravida  2   Para  1   Term  1   Preterm      AB  1   Living  1     SAB      IAB      Ectopic  1   Multiple      Live Births                 Patient Active Problem List   Diagnosis Date Noted  . Attention deficit hyperactivity disorder 06/12/2020  . Chronic low back pain 06/12/2020  . Chronic obstructive pulmonary disease, unspecified ([place]) 06/12/2020  . Compression of brain ([place]) 06/12/2020  . Ehlers-Danlos syndrome, unspecified 06/12/2020  . Encounter for general adult medical examination without abnormal findings 06/12/2020  . Prediabetes   . Hypothyroid   . AVM (arteriovenous malformation) spine 04/21/2018  . Common migraine with intractable migraine 04/21/2018  . Other malaise and fatigue 03/28/2013  . History of Chiari malformation 03/28/2013  . Hypersomnia, persistent 03/28/2013  . Cerebral venous sinus thrombosis, remote, resolved 03/28/2013  . Nystagmus, positional, central type 03/28/2013  . Menorrhagia 07/31/2011    Past Medical History:  Diagnosis Date  . ADHD (attention deficit hyperactivity disorder)     no meds for  . Anxiety   . Arnold-Chiari deformity ([place])   . Asthma   . AVM (arteriovenous malformation) spine 04/21/2018  . Bruises easily   . Cerebral venous sinus thrombosis, remote, resolved 03/28/2013  . Chronic pain   . Common migraine with intractable migraine 04/21/2018  . Complication of anesthesia age 88    breast lumpectomy heart rate went way down in hospital stayed 2 days, no problems since  . Complication of anesthesia    C 1 to C 3 fusion side to side and up and down limited   . EDS (Ehlers-Danlos syndrome)   . Eustachian tube dysfunction   . Fatigue   . GERD (gastroesophageal reflux disease)   . Glioma ([place])    low graded tectal plate glioma  . Hemorrhoids   . History of sleep apnea    no cpap used last 2 years, normal sleep study 2 yrs ago  . HSV infection   . Hypothyroid   . Hypothyroidism   . IIH (idiopathic intracranial hypertension)   . Lumbar disc herniation   . Migraine   . Neck stiffness   . Nystagmus, positional, central type 03/28/2013  . Other malaise and fatigue 03/28/2013   " The patient reports feeling happy, ED, sore" on she has undergone at Chiari malformation surgery decompression in 2011.  Prior to the surgery were chief complaints were weakness numbness and neck problems. She had swallowing difficulties and dizziness;  Classic Chiari presentation. But she didn't have headaches.  . Photophobia   . Pre-diabetes   . Tinnitus of both ears   . Urinary incontinence   . Vision disturbance   . Vitamin D deficiency   . Weakness   . Wears glasses     Past Surgical History:  Procedure Laterality Date  . ARNOLD CHIARI REPAIR  2010  . Brain shunt  12/30/2019   vp shunt  . BRAIN SURGERY    . BREAST BIOPSY Left    lumpectomy age 35   . BREAST EXCISIONAL BIOPSY Right yrs ago  . CHOLECYSTECTOMY  age 89   laparoscopic  . colonscopy  2012, 2018  . DILATION AND CURETTAGE OF UTERUS  last done yrs ago   x 2 or 3  . ENDOMETRIAL ABLATION  02/09/2013    [facility]  . HYSTEROSCOPY WITH D & C N/A 06/26/2020   Procedure: DILATATION AND CURETTAGE /HYSTEROSCOPY;  Surgeon: Salvadore Dom, MD;  Location[facility];  Service: Gynecology;  Laterality: N/A;  . INTRAUTERINE DEVICE INSERTION  10/2010   Mirena  . IUD REMOVAL  11/2010   could not tolerate hormonal side effects  . metal plate removed from head  2015   screw came loose  . neck fusion c1-c3  2010  . OPERATIVE ULTRASOUND N/A 06/26/2020   Procedure: OPERATIVE ULTRASOUND;  Surgeon: Salvadore Dom, MD;  Location: [facility];  Service: Gynecology;  Laterality: N/A;  . TONSILLECTOMY AND ADENOIDECTOMY  age 75  . UPPER GI ENDOSCOPY  2012, 2018    Current Outpatient Medications  Medication Sig Dispense Refill  . albuterol (VENTOLIN HFA) 108 (90 Base) MCG/ACT inhaler Inhale 1-2 puffs into the lungs every 4 (four) hours as needed for wheezing or shortness of breath.    . cyclobenzaprine (FLEXERIL) 5 MG tablet Take 5 mg by mouth 3 (three) times daily as needed for muscle spasms.    Marland Kitchen dexlansoprazole (DEXILANT) 60 MG capsule Take 60 mg by mouth at bedtime.    . lamoTRIgine (LAMICTAL) 25 MG tablet daily.    Marland Kitchen levothyroxine (SYNTHROID) 150 MCG tablet Take 150 mcg by mouth daily before breakfast.    . loratadine (CLARITIN) 10 MG tablet Take 10 mg by mouth daily.    . metFORMIN (GLUCOPHAGE-XR) 500 MG 24 hr tablet Take 500 mg by mouth daily.    . norethindrone (MICRONOR) 0.35 MG tablet Take 1 tablet (0.35 mg total) by mouth daily. 84 tablet 0  . ondansetron (ZOFRAN) 4 MG tablet Take 1 tablet (4 mg total) by mouth every 8 (eight) hours as needed for nausea or vomiting. 30 tablet 0  . Oxycodone HCl 10 MG TABS Take 10 mg by mouth every 4 (four) hours as needed (pain.).     Marland Kitchen promethazine (PHENERGAN) 25 MG tablet Take 25 mg by mouth every 6 (six) hours as needed. Nausea    . UNABLE TO FIND Cbd gummies 1 in am Cbd sleep gummy at hs    . valACYclovir (VALTREX) 1000 MG  tablet Take 2 tabs (2 grams) po now and repeat in 12 hours prn coldsore. 20 tablet 1   No current facility-administered medications for this visit.     ALLERGIES: Other, Prednisone, and Morphine and related  Family History  Problem Relation Age of Onset  . Aortic dissection Father   . Diabetes Maternal Aunt   .  Breast cancer Maternal Aunt        40's  . Diabetes Maternal Uncle   . Breast cancer Paternal Aunt 26  . Diabetes Maternal Grandmother   . Cancer Maternal Grandmother        SKIN AND LUNG  . Diabetes Paternal Grandmother   . Breast cancer Maternal Aunt        50's  . Breast cancer Maternal Aunt        50's    Social History   Socioeconomic History  . Marital status: Divorced    Spouse name: Not on file  . Number of children: 1  . Years of education: 33  . Highest education level: Not on file  Occupational History    Employer: OTHER  Tobacco Use  . Smoking status: Former Smoker    Packs/day: 2.00    Years: 21.00    Pack years: 42.00    Types: Cigarettes    Quit date: 09/09/2007    Years since quitting: 12.8  . Smokeless tobacco: Never Used  Vaping Use  . Vaping Use: Never used  Substance and Sexual Activity  . Alcohol use: No    Comment: h/o binge drinking none in 10 years  . Drug use: No  . Sexual activity: Yes    Partners: Male    Birth control/protection: Other-see comments    Comment: Vasectomy-1st intercourse 55 yo-More than 5 partners  Other Topics Concern  . Not on file  Social History Narrative   Patient is divorced and lives at home with her one child.   Patient is disabled.   Patient is right-handed.   Patient has a college education.   Patient drinks one cup of coffee daily.   Social Determinants of Health   Financial Resource Strain: Not on file  Food Insecurity: Not on file  Transportation Needs: Not on file  Physical Activity: Not on file  Stress: Not on file  Social Connections: Not on file  Intimate Partner Violence: Not on  file    ROS  PHYSICAL EXAMINATION:    BP 110/82   Pulse 74   Wt 215 lb (97.5 kg)   SpO2 94%   BMI 33.67 kg/m     General appearance: alert, cooperative and appears stated age Abdomen: soft, tender in the RLQ, no rebound, no guarding; non distended, no masses,  no organomegaly  Pelvic: External genitalia:  no lesions              Cervix: no cervical motion tenderness              Bimanual Exam:  Uterus:  normal size, contour, position, consistency, mobility, non-tender              Adnexa: no masses, mildly tender on the right.                 1. History of hysteroscopy Recovered well  2. History of menorrhagia Has a script for micronor, hasn't started it. Will start the micronor, keep track of bleeding and f/u in 3 months  3. History of endometrial ablation Scared uterine cavity  4. Combined abdominal and pelvic pain In 4/22 she had a 3 cm complex right ovarian cyst c/w a hemorrhagic CL (only 1 cm follicle on the right in 3/22) If her pain persists will get another ultrasound.   ~20 minutes in total patient care

## 2020-07-25 DIAGNOSIS — S60221A Contusion of right hand, initial encounter: Secondary | ICD-10-CM | POA: Diagnosis not present

## 2020-07-25 DIAGNOSIS — M79641 Pain in right hand: Secondary | ICD-10-CM | POA: Diagnosis not present

## 2020-07-26 DIAGNOSIS — N3946 Mixed incontinence: Secondary | ICD-10-CM | POA: Diagnosis not present

## 2020-07-26 DIAGNOSIS — Q796 Ehlers-Danlos syndrome, unspecified: Secondary | ICD-10-CM | POA: Diagnosis not present

## 2020-07-26 DIAGNOSIS — R351 Nocturia: Secondary | ICD-10-CM | POA: Diagnosis not present

## 2020-08-01 ENCOUNTER — Encounter: Payer: PPO | Admitting: Obstetrics and Gynecology

## 2020-08-02 ENCOUNTER — Ambulatory Visit: Payer: PPO | Admitting: Obstetrics and Gynecology

## 2020-08-08 DIAGNOSIS — Z79891 Long term (current) use of opiate analgesic: Secondary | ICD-10-CM | POA: Diagnosis not present

## 2020-08-08 DIAGNOSIS — M961 Postlaminectomy syndrome, not elsewhere classified: Secondary | ICD-10-CM | POA: Diagnosis not present

## 2020-08-08 DIAGNOSIS — M62830 Muscle spasm of back: Secondary | ICD-10-CM | POA: Diagnosis not present

## 2020-08-08 DIAGNOSIS — G894 Chronic pain syndrome: Secondary | ICD-10-CM | POA: Diagnosis not present

## 2020-08-09 ENCOUNTER — Telehealth: Payer: Self-pay

## 2020-08-09 ENCOUNTER — Other Ambulatory Visit: Payer: Self-pay | Admitting: Obstetrics and Gynecology

## 2020-08-09 NOTE — Telephone Encounter (Signed)
April Meyer, [place] Triage Patient called regarding a notification in mychart about labs. She would like to know what this was for or if she needed to come in for them.    I called patient and told her back in Feb Dr. Delilah Shan recommended rechecking TSH level in 4 weeks and that is likely what she is referring to.  She said to make a note that she did check that with her PCP.

## 2020-08-23 DIAGNOSIS — N3946 Mixed incontinence: Secondary | ICD-10-CM | POA: Diagnosis not present

## 2020-08-23 DIAGNOSIS — Q796 Ehlers-Danlos syndrome, unspecified: Secondary | ICD-10-CM | POA: Diagnosis not present

## 2020-08-23 DIAGNOSIS — M6281 Muscle weakness (generalized): Secondary | ICD-10-CM | POA: Diagnosis not present

## 2020-09-05 ENCOUNTER — Encounter: Payer: Self-pay | Admitting: Obstetrics and Gynecology

## 2020-09-05 ENCOUNTER — Ambulatory Visit: Payer: PPO | Admitting: Obstetrics and Gynecology

## 2020-09-05 ENCOUNTER — Other Ambulatory Visit: Payer: Self-pay

## 2020-09-05 VITALS — BP 112/62 | HR 63 | Ht 67.0 in | Wt 208.0 lb

## 2020-09-05 DIAGNOSIS — R112 Nausea with vomiting, unspecified: Secondary | ICD-10-CM

## 2020-09-05 DIAGNOSIS — Z9889 Other specified postprocedural states: Secondary | ICD-10-CM

## 2020-09-05 DIAGNOSIS — Z8742 Personal history of other diseases of the female genital tract: Secondary | ICD-10-CM | POA: Diagnosis not present

## 2020-09-05 MED ORDER — PROMETHAZINE HCL 25 MG PO TABS: 25.0000 mg | ORAL_TABLET | Freq: Four times a day (QID) | ORAL | 1 refills | Status: DC | PRN

## 2020-09-05 NOTE — Progress Notes (Signed)
GYNECOLOGY  VISIT   HPI: 55 y.o.   Divorced White or Caucasian Not Hispanic or Latino  female   [phone] with Patient's last menstrual period was 08/10/2020.   here for  3 month follow up from [facility] She states that she vomits during her period.   The patient had an endometrial ablation in 12/14. She has had cycles since the ablation, but was seen earlier this year for menorrhagia. FSH was 36.9, Hgb was 12.7. Endometrial biopsy returned with a polyp.   She underwent a hysteroscopy, D&C under ultrasound guidance in 4/22. Her cavity was scarred. [place] returned with inactive endometrium and some smooth muscle.  She was given a script for the minipill a few months ago, never tried it. She filled the script so she has it at home.  In the last couple of months her cycles have been monthly. She gets nausea for a few days prior to her cycles (for years), in the past she has taken phenergan which has helped. Zofran doesn't help. She needs the phenergan for ~2 days a month. Without it she is vomiting repeatedly. She has mild cramping when she has the nausea. Her cycles are lasting for 5 days, lighter since the [facility], can get by with just a mini-pad.  The only problem right now is the emesis.   GYNECOLOGIC HISTORY: Patient's last menstrual period was 08/10/2020. Contraception:surgical   Menopausal hormone therapy: none         OB History     Gravida  2   Para  1   Term  1   Preterm      AB  1   Living  1      SAB      IAB      Ectopic  1   Multiple      Live Births                 Patient Active Problem List   Diagnosis Date Noted   Attention deficit hyperactivity disorder 06/12/2020   Chronic low back pain 06/12/2020   Chronic obstructive pulmonary disease, unspecified ([place]) 06/12/2020   Compression of brain ([place]) 06/12/2020   Ehlers-Danlos syndrome, unspecified 06/12/2020   Encounter for general adult medical examination without abnormal findings 06/12/2020   Prediabetes     Hypothyroid    AVM (arteriovenous malformation) spine 04/21/2018   Common migraine with intractable migraine 04/21/2018   Other malaise and fatigue 03/28/2013   History of Chiari malformation 03/28/2013   Hypersomnia, persistent 03/28/2013   Cerebral venous sinus thrombosis, remote, resolved 03/28/2013   Nystagmus, positional, central type 03/28/2013   Menorrhagia 07/31/2011    Past Medical History:  Diagnosis Date   ADHD (attention deficit hyperactivity disorder)    no meds for   Anxiety    Arnold-Chiari deformity (HCC)    Asthma    AVM (arteriovenous malformation) spine 04/21/2018   Bruises easily    Cerebral venous sinus thrombosis, remote, resolved 03/28/2013   Chronic pain    Common migraine with intractable migraine 9/93/5701   Complication of anesthesia age 75    breast lumpectomy heart rate went way down in hospital stayed 2 days, no problems since   Complication of anesthesia    C 1 to C 3 fusion side to side and up and down limited    EDS (Ehlers-Danlos syndrome)    Eustachian tube dysfunction    Fatigue    GERD (gastroesophageal reflux disease)    Glioma ([place])  low graded tectal plate glioma   Hemorrhoids    History of sleep apnea    no cpap used last 2 years, normal sleep study 2 yrs ago   HSV infection    Hypothyroid    Hypothyroidism    IIH (idiopathic intracranial hypertension)    Lumbar disc herniation    Migraine    Neck stiffness    Nystagmus, positional, central type 03/28/2013   Other malaise and fatigue 03/28/2013   " The patient reports feeling happy, ED, sore" on she has undergone at Chiari malformation surgery decompression in 2011. Prior to the surgery were chief complaints were weakness numbness and neck problems. She had swallowing difficulties and dizziness;  Classic Chiari presentation. But she didn't have headaches.   Photophobia    Pre-diabetes    Tinnitus of both ears    Urinary incontinence    Vision disturbance    Vitamin D deficiency     Weakness    Wears glasses     Past Surgical History:  Procedure Laterality Date   ARNOLD CHIARI REPAIR  2010   Brain shunt  12/30/2019   vp shunt   BRAIN SURGERY     BREAST BIOPSY Left    lumpectomy age 2    BREAST EXCISIONAL BIOPSY Right yrs ago   CHOLECYSTECTOMY  age 27   laparoscopic   colonscopy  2012, 2018   DILATION AND CURETTAGE OF UTERUS  last done yrs ago   x 2 or 3   ENDOMETRIAL ABLATION  02/09/2013   [facility]   HYSTEROSCOPY WITH D & C N/A 06/26/2020   Procedure: DILATATION AND CURETTAGE /HYSTEROSCOPY;  Surgeon: Salvadore Dom, MD;  Location: [place];  Service: Gynecology;  Laterality: N/A;   INTRAUTERINE DEVICE INSERTION  10/2010   Mirena   IUD REMOVAL  11/2010   could not tolerate hormonal side effects   metal plate removed from head  2015   screw came loose   neck fusion c1-c3  2010   OPERATIVE ULTRASOUND N/A 06/26/2020   Procedure: OPERATIVE ULTRASOUND;  Surgeon: Salvadore Dom, MD;  Location: [facility];  Service: Gynecology;  Laterality: N/A;   TONSILLECTOMY AND ADENOIDECTOMY  age 14   UPPER GI ENDOSCOPY  2012, 2018    Current Outpatient Medications  Medication Sig Dispense Refill   albuterol (VENTOLIN HFA) 108 (90 Base) MCG/ACT inhaler Inhale 1-2 puffs into the lungs every 4 (four) hours as needed for wheezing or shortness of breath.     cyclobenzaprine (FLEXERIL) 5 MG tablet Take 5 mg by mouth 3 (three) times daily as needed for muscle spasms.     dexlansoprazole (DEXILANT) 60 MG capsule Take 60 mg by mouth at bedtime.     lamoTRIgine (LAMICTAL) 25 MG tablet daily.     levothyroxine (SYNTHROID) 150 MCG tablet Take 150 mcg by mouth daily before breakfast.     loratadine (CLARITIN) 10 MG tablet Take 10 mg by mouth daily.     metFORMIN (GLUCOPHAGE-XR) 500 MG 24 hr tablet Take 500 mg by mouth daily.     ondansetron (ZOFRAN) 4 MG tablet Take 1 tablet (4 mg total) by mouth every 8 (eight) hours as needed for  nausea or vomiting. 30 tablet 0   Oxycodone HCl 10 MG TABS Take 10 mg by mouth every 4 (four) hours as needed (pain.).      promethazine (PHENERGAN) 25 MG tablet Take 25 mg by mouth every 6 (six) hours as needed. Nausea  UNABLE TO FIND Cbd gummies 1 in am Cbd sleep gummy at hs     valACYclovir (VALTREX) 1000 MG tablet Take 2 tabs (2 grams) po now and repeat in 12 hours prn coldsore. 20 tablet 1   No current facility-administered medications for this visit.     ALLERGIES: Other, Prednisone, and Morphine and related  Family History  Problem Relation Age of Onset   Aortic dissection Father    Diabetes Maternal Aunt    Breast cancer Maternal Aunt        40's   Diabetes Maternal Uncle    Breast cancer Paternal Aunt 24   Diabetes Maternal Grandmother    Cancer Maternal Grandmother        SKIN AND LUNG   Diabetes Paternal Grandmother    Breast cancer Maternal Aunt        50's   Breast cancer Maternal Aunt        50's    Social History   Socioeconomic History   Marital status: Divorced    Spouse name: Not on file   Number of children: 1   Years of education: 16   Highest education level: Not on file  Occupational History    Employer: OTHER  Tobacco Use   Smoking status: Former    Packs/day: 2.00    Years: 21.00    Pack years: 42.00    Types: Cigarettes    Quit date: 09/09/2007    Years since quitting: 13.0   Smokeless tobacco: Never  Vaping Use   Vaping Use: Never used  Substance and Sexual Activity   Alcohol use: No    Comment: h/o binge drinking none in 10 years   Drug use: No   Sexual activity: Yes    Partners: Male    Birth control/protection: Other-see comments    Comment: Vasectomy-1st intercourse 55 yo-More than 5 partners  Other Topics Concern   Not on file  Social History Narrative   Patient is divorced and lives at home with her one child.   Patient is disabled.   Patient is right-handed.   Patient has a college education.   Patient drinks one cup  of coffee daily.   Social Determinants of Health   Financial Resource Strain: Not on file  Food Insecurity: Not on file  Transportation Needs: Not on file  Physical Activity: Not on file  Stress: Not on file  Social Connections: Not on file  Intimate Partner Violence: Not on file    Review of Systems  All other systems reviewed and are negative.  PHYSICAL EXAMINATION:    BP 112/62   Pulse 63   Ht 5\' 7"  (1.702 m)   Wt 208 lb (94.3 kg)   LMP 08/10/2020   SpO2 98%   BMI 32.58 kg/m     General appearance: alert, cooperative and appears stated age  41. Non-intractable vomiting with nausea, unspecified vomiting type She has had long term issues with nausea and vomiting for 2 days prior to her cycles. Currently worse. She has Zofran, but it isn't helping. Previously helped with phenergan. - promethazine (PHENERGAN) 25 MG tablet; Take 1 tablet (25 mg total) by mouth every 6 (six) hours as needed for nausea. Nausea  Dispense: 30 tablet; Refill: 1  2. History of menorrhagia Improved since the [facility] in April, currently bleeding is light -She has a script for micronor to take if her bleeding gets heavy again -F/U for annual exam in 9/22  3. History of endometrial ablation

## 2020-09-13 DIAGNOSIS — R7301 Impaired fasting glucose: Secondary | ICD-10-CM | POA: Diagnosis not present

## 2020-09-13 DIAGNOSIS — Z0001 Encounter for general adult medical examination with abnormal findings: Secondary | ICD-10-CM | POA: Diagnosis not present

## 2020-09-13 DIAGNOSIS — E039 Hypothyroidism, unspecified: Secondary | ICD-10-CM | POA: Diagnosis not present

## 2020-09-20 ENCOUNTER — Other Ambulatory Visit: Payer: Self-pay | Admitting: Obstetrics and Gynecology

## 2020-09-20 DIAGNOSIS — F411 Generalized anxiety disorder: Secondary | ICD-10-CM | POA: Diagnosis not present

## 2020-09-20 DIAGNOSIS — N920 Excessive and frequent menstruation with regular cycle: Secondary | ICD-10-CM

## 2020-09-20 DIAGNOSIS — Q0700 Arnold-Chiari syndrome without spina bifida or hydrocephalus: Secondary | ICD-10-CM | POA: Diagnosis not present

## 2020-09-20 DIAGNOSIS — G8929 Other chronic pain: Secondary | ICD-10-CM | POA: Diagnosis not present

## 2020-09-20 DIAGNOSIS — G932 Benign intracranial hypertension: Secondary | ICD-10-CM | POA: Diagnosis not present

## 2020-09-20 DIAGNOSIS — Q796 Ehlers-Danlos syndrome, unspecified: Secondary | ICD-10-CM | POA: Diagnosis not present

## 2020-09-20 DIAGNOSIS — F39 Unspecified mood [affective] disorder: Secondary | ICD-10-CM | POA: Diagnosis not present

## 2020-09-20 DIAGNOSIS — F4312 Post-traumatic stress disorder, chronic: Secondary | ICD-10-CM | POA: Diagnosis not present

## 2020-09-21 NOTE — Telephone Encounter (Signed)
Per 09/05/20 office note "She was given a script for the minipill a few months ago, never tried it. She filled the script so she has it at home."  Rx denied.

## 2020-09-26 ENCOUNTER — Telehealth: Payer: Self-pay

## 2020-09-26 NOTE — Telephone Encounter (Signed)
Last AEX 07/28/19 with J.Kendall, MD Scheduled 12/04/20 with Dr. Talbert Nan (we r/s her once).

## 2020-09-28 MED ORDER — VALACYCLOVIR HCL 1 G PO TABS: ORAL_TABLET | ORAL | 1 refills | Status: DC

## 2020-09-28 NOTE — Telephone Encounter (Signed)
Valtrex sent to her pharmacy

## 2020-10-08 DIAGNOSIS — Z79891 Long term (current) use of opiate analgesic: Secondary | ICD-10-CM | POA: Diagnosis not present

## 2020-10-08 DIAGNOSIS — M62830 Muscle spasm of back: Secondary | ICD-10-CM | POA: Diagnosis not present

## 2020-10-08 DIAGNOSIS — M961 Postlaminectomy syndrome, not elsewhere classified: Secondary | ICD-10-CM | POA: Diagnosis not present

## 2020-10-08 DIAGNOSIS — G894 Chronic pain syndrome: Secondary | ICD-10-CM | POA: Diagnosis not present

## 2020-10-30 ENCOUNTER — Other Ambulatory Visit: Payer: Self-pay | Admitting: Obstetrics and Gynecology

## 2020-10-30 NOTE — Telephone Encounter (Signed)
Last AEX 07/28/19 Scheduled 12/04/20

## 2020-11-02 DIAGNOSIS — J3489 Other specified disorders of nose and nasal sinuses: Secondary | ICD-10-CM | POA: Diagnosis not present

## 2020-11-02 DIAGNOSIS — M419 Scoliosis, unspecified: Secondary | ICD-10-CM | POA: Insufficient documentation

## 2020-11-02 DIAGNOSIS — R0981 Nasal congestion: Secondary | ICD-10-CM | POA: Diagnosis not present

## 2020-11-02 DIAGNOSIS — M25559 Pain in unspecified hip: Secondary | ICD-10-CM | POA: Insufficient documentation

## 2020-11-02 DIAGNOSIS — I7300 Raynaud's syndrome without gangrene: Secondary | ICD-10-CM | POA: Insufficient documentation

## 2020-11-02 DIAGNOSIS — U071 COVID-19: Secondary | ICD-10-CM | POA: Diagnosis not present

## 2020-11-02 DIAGNOSIS — M12812 Other specific arthropathies, not elsewhere classified, left shoulder: Secondary | ICD-10-CM | POA: Insufficient documentation

## 2020-11-02 DIAGNOSIS — R059 Cough, unspecified: Secondary | ICD-10-CM | POA: Diagnosis not present

## 2020-11-28 NOTE — Progress Notes (Deleted)
55 y.o. G60P1011 Divorced White or Caucasian Not Hispanic or Latino female here for annual exam.      No LMP recorded. (Menstrual status: Perimenopausal).          Sexually active: {yes no:314532}  The current method of family planning is {contraception:315051}.    Exercising: {yes no:314532}  {types:19826} Smoker:  {YES P5382123  Health Maintenance: Pap:  05/08/16 WNL HR HPV Neg, 12/16/11  History of abnormal Pap:  {YES NO:22349} MMG:  03/15/19 density B Bi-rads 1 neg  BMD:   none Colonoscopy: *** YNWG:[id number]  *** Gardasil: n/a   reports that she quit smoking about 13 years ago. Her smoking use included cigarettes. She has a 42.00 pack-year smoking history. She has never used smokeless tobacco. She reports that she does not drink alcohol and does not use drugs.  Past Medical History:  Diagnosis Date   ADHD (attention deficit hyperactivity disorder)    no meds for   Anxiety    Arnold-Chiari deformity (HCC)    Asthma    AVM (arteriovenous malformation) spine 04/21/2018   Bruises easily    Cerebral venous sinus thrombosis, remote, resolved 03/28/2013   Chronic pain    Common migraine with intractable migraine 04/09/8655   Complication of anesthesia age 25    breast lumpectomy heart rate went way down in hospital stayed 2 days, no problems since   Complication of anesthesia    C 1 to C 3 fusion side to side and up and down limited    EDS (Ehlers-Danlos syndrome)    Eustachian tube dysfunction    Fatigue    GERD (gastroesophageal reflux disease)    Glioma (HCC)    low graded tectal plate glioma   Hemorrhoids    History of sleep apnea    no cpap used last 2 years, normal sleep study 2 yrs ago   HSV infection    Hypothyroid    Hypothyroidism    IIH (idiopathic intracranial hypertension)    Lumbar disc herniation    Migraine    Neck stiffness    Nystagmus, positional, central type 03/28/2013   Other malaise and fatigue 03/28/2013   " The patient reports feeling happy, ED, sore" on  she has undergone at Chiari malformation surgery decompression in 2011. Prior to the surgery were chief complaints were weakness numbness and neck problems. She had swallowing difficulties and dizziness;  Classic Chiari presentation. But she didn't have headaches.   Photophobia    Pre-diabetes    Tinnitus of both ears    Urinary incontinence    Vision disturbance    Vitamin D deficiency    Weakness    Wears glasses     Past Surgical History:  Procedure Laterality Date   ARNOLD CHIARI REPAIR  2010   Brain shunt  12/30/2019   vp shunt   BRAIN SURGERY     BREAST BIOPSY Left    lumpectomy age 14    BREAST EXCISIONAL BIOPSY Right yrs ago   CHOLECYSTECTOMY  age 37   laparoscopic   colonscopy  2012, 2018   DILATION AND CURETTAGE OF UTERUS  last done yrs ago   x 2 or 3   ENDOMETRIAL ABLATION  02/09/2013   [facility]   HYSTEROSCOPY WITH D & C N/A 06/26/2020   Procedure: DILATATION AND CURETTAGE /HYSTEROSCOPY;  Surgeon: Salvadore Dom, MD;  Location: [place];  Service: Gynecology;  Laterality: N/A;   INTRAUTERINE DEVICE INSERTION  10/2010   Mirena   IUD REMOVAL  11/2010   could not tolerate hormonal side effects   metal plate removed from head  2015   screw came loose   neck fusion c1-c3  2010   OPERATIVE ULTRASOUND N/A 06/26/2020   Procedure: OPERATIVE ULTRASOUND;  Surgeon: Salvadore Dom, MD;  Location: [facility];  Service: Gynecology;  Laterality: N/A;   TONSILLECTOMY AND ADENOIDECTOMY  age 28   UPPER GI ENDOSCOPY  2012, 2018    Current Outpatient Medications  Medication Sig Dispense Refill   albuterol (VENTOLIN HFA) 108 (90 Base) MCG/ACT inhaler Inhale 1-2 puffs into the lungs every 4 (four) hours as needed for wheezing or shortness of breath.     cyclobenzaprine (FLEXERIL) 5 MG tablet Take 5 mg by mouth 3 (three) times daily as needed for muscle spasms.     dexlansoprazole (DEXILANT) 60 MG capsule Take 60 mg by mouth at bedtime.      lamoTRIgine (LAMICTAL) 25 MG tablet daily.     levothyroxine (SYNTHROID) 150 MCG tablet Take 150 mcg by mouth daily before breakfast.     loratadine (CLARITIN) 10 MG tablet Take 10 mg by mouth daily.     metFORMIN (GLUCOPHAGE-XR) 500 MG 24 hr tablet Take 500 mg by mouth daily.     ondansetron (ZOFRAN) 4 MG tablet Take 1 tablet (4 mg total) by mouth every 8 (eight) hours as needed for nausea or vomiting. 30 tablet 0   Oxycodone HCl 10 MG TABS Take 10 mg by mouth every 4 (four) hours as needed (pain.).      promethazine (PHENERGAN) 25 MG tablet Take 1 tablet (25 mg total) by mouth every 6 (six) hours as needed for nausea. Nausea 30 tablet 1   UNABLE TO FIND Cbd gummies 1 in am Cbd sleep gummy at hs     valACYclovir (VALTREX) 1000 MG tablet TAKE 2 TABS (2 GRAMS) BY MOUTH NOW AND REPEAT IN 12 HOURS X 1, AS NEEDED FOR [place] 30 tablet 1   No current facility-administered medications for this visit.    Family History  Problem Relation Age of Onset   Aortic dissection Father    Diabetes Maternal Aunt    Breast cancer Maternal Aunt        40's   Diabetes Maternal Uncle    Breast cancer Paternal Aunt 87   Diabetes Maternal Grandmother    Cancer Maternal Grandmother        SKIN AND LUNG   Diabetes Paternal Grandmother    Breast cancer Maternal Aunt        50's   Breast cancer Maternal Aunt        50's    Review of Systems  Exam:   There were no vitals taken for this visit.  Weight change: @WEIGHTCHANGE @ Height:      Ht Readings from Last 3 Encounters:  09/05/20 5\' 7"  (1.702 m)  06/26/20 5\' 7"  (1.702 m)  06/12/20 5\' 7"  (1.702 m)    General appearance: alert, cooperative and appears stated age Head: Normocephalic, without obvious abnormality, atraumatic Neck: no adenopathy, supple, symmetrical, trachea midline and thyroid {CHL AMB PHY EX THYROID NORM DEFAULT:[phone]::"normal to inspection and palpation"} Lungs: clear to auscultation bilaterally Cardiovascular: regular rate and  rhythm Breasts: {Exam; breast:13139::"normal appearance, no masses or tenderness"} Abdomen: soft, non-tender; non distended,  no masses,  no organomegaly Extremities: extremities normal, atraumatic, no cyanosis or edema Skin: Skin color, texture, turgor normal. No rashes or lesions Lymph nodes: Cervical, supraclavicular, and axillary nodes normal. No abnormal inguinal  nodes palpated Neurologic: Grossly normal   Pelvic: External genitalia:  no lesions              Urethra:  normal appearing urethra with no masses, tenderness or lesions              Bartholins and Skenes: normal                 Vagina: normal appearing vagina with normal color and discharge, no lesions              Cervix: {CHL AMB PHY EX CERVIX NORM DEFAULT:[phone]::"no lesions"}               Bimanual Exam:  Uterus:  {CHL AMB PHY EX UTERUS NORM DEFAULT:[id number]::"normal size, contour, position, consistency, mobility, non-tender"}              Adnexa: {CHL AMB PHY EX ADNEXA NO MASS DEFAULT:[id number]::"no mass, fullness, tenderness"}               Rectovaginal: Confirms               Anus:  normal sphincter tone, no lesions  *** chaperoned for the exam.  A:  Well Woman with normal exam  P:

## 2020-12-03 DIAGNOSIS — M62830 Muscle spasm of back: Secondary | ICD-10-CM | POA: Diagnosis not present

## 2020-12-03 DIAGNOSIS — G894 Chronic pain syndrome: Secondary | ICD-10-CM | POA: Diagnosis not present

## 2020-12-03 DIAGNOSIS — M961 Postlaminectomy syndrome, not elsewhere classified: Secondary | ICD-10-CM | POA: Diagnosis not present

## 2020-12-03 DIAGNOSIS — Z79891 Long term (current) use of opiate analgesic: Secondary | ICD-10-CM | POA: Diagnosis not present

## 2020-12-04 ENCOUNTER — Ambulatory Visit: Payer: PPO | Admitting: Obstetrics and Gynecology

## 2020-12-13 DIAGNOSIS — L821 Other seborrheic keratosis: Secondary | ICD-10-CM | POA: Diagnosis not present

## 2020-12-13 DIAGNOSIS — L812 Freckles: Secondary | ICD-10-CM | POA: Diagnosis not present

## 2020-12-13 DIAGNOSIS — L820 Inflamed seborrheic keratosis: Secondary | ICD-10-CM | POA: Diagnosis not present

## 2020-12-13 DIAGNOSIS — Z85828 Personal history of other malignant neoplasm of skin: Secondary | ICD-10-CM | POA: Diagnosis not present

## 2020-12-13 DIAGNOSIS — D1801 Hemangioma of skin and subcutaneous tissue: Secondary | ICD-10-CM | POA: Diagnosis not present

## 2021-01-22 DIAGNOSIS — Z20828 Contact with and (suspected) exposure to other viral communicable diseases: Secondary | ICD-10-CM | POA: Diagnosis not present

## 2021-01-22 DIAGNOSIS — J209 Acute bronchitis, unspecified: Secondary | ICD-10-CM | POA: Diagnosis not present

## 2021-01-22 DIAGNOSIS — Z634 Disappearance and death of family member: Secondary | ICD-10-CM | POA: Diagnosis not present

## 2021-01-22 DIAGNOSIS — F4321 Adjustment disorder with depressed mood: Secondary | ICD-10-CM | POA: Diagnosis not present

## 2021-01-22 DIAGNOSIS — R062 Wheezing: Secondary | ICD-10-CM | POA: Diagnosis not present

## 2021-01-29 ENCOUNTER — Other Ambulatory Visit: Payer: Self-pay | Admitting: Family Medicine

## 2021-01-29 DIAGNOSIS — B349 Viral infection, unspecified: Secondary | ICD-10-CM | POA: Diagnosis not present

## 2021-01-29 DIAGNOSIS — E559 Vitamin D deficiency, unspecified: Secondary | ICD-10-CM | POA: Diagnosis not present

## 2021-01-29 DIAGNOSIS — R059 Cough, unspecified: Secondary | ICD-10-CM | POA: Diagnosis not present

## 2021-01-29 DIAGNOSIS — R062 Wheezing: Secondary | ICD-10-CM | POA: Diagnosis not present

## 2021-01-29 DIAGNOSIS — G8929 Other chronic pain: Secondary | ICD-10-CM | POA: Diagnosis not present

## 2021-01-29 DIAGNOSIS — E039 Hypothyroidism, unspecified: Secondary | ICD-10-CM | POA: Diagnosis not present

## 2021-01-29 DIAGNOSIS — K219 Gastro-esophageal reflux disease without esophagitis: Secondary | ICD-10-CM | POA: Diagnosis not present

## 2021-01-29 DIAGNOSIS — Q0700 Arnold-Chiari syndrome without spina bifida or hydrocephalus: Secondary | ICD-10-CM | POA: Diagnosis not present

## 2021-01-29 DIAGNOSIS — Z634 Disappearance and death of family member: Secondary | ICD-10-CM | POA: Diagnosis not present

## 2021-01-29 DIAGNOSIS — R001 Bradycardia, unspecified: Secondary | ICD-10-CM | POA: Diagnosis not present

## 2021-01-29 DIAGNOSIS — Q796 Ehlers-Danlos syndrome, unspecified: Secondary | ICD-10-CM | POA: Diagnosis not present

## 2021-01-29 DIAGNOSIS — Z0001 Encounter for general adult medical examination with abnormal findings: Secondary | ICD-10-CM | POA: Diagnosis not present

## 2021-01-30 ENCOUNTER — Inpatient Hospital Stay (HOSPITAL_COMMUNITY)
Admission: EM | Admit: 2021-01-30 | Discharge: 2021-02-05 | DRG: 917 | Disposition: A | Discharge status: Home / Self Care | Payer: PPO | Attending: Internal Medicine | Admitting: Internal Medicine

## 2021-01-30 ENCOUNTER — Encounter (HOSPITAL_COMMUNITY): Payer: Self-pay | Admitting: Internal Medicine

## 2021-01-30 ENCOUNTER — Emergency Department (HOSPITAL_COMMUNITY): Payer: PPO

## 2021-01-30 DIAGNOSIS — R059 Cough, unspecified: Secondary | ICD-10-CM

## 2021-01-30 DIAGNOSIS — X31XXXA Exposure to excessive natural cold, initial encounter: Secondary | ICD-10-CM

## 2021-01-30 DIAGNOSIS — Z85841 Personal history of malignant neoplasm of brain: Secondary | ICD-10-CM

## 2021-01-30 DIAGNOSIS — K449 Diaphragmatic hernia without obstruction or gangrene: Secondary | ICD-10-CM | POA: Diagnosis present

## 2021-01-30 DIAGNOSIS — T402X4A Poisoning by other opioids, undetermined, initial encounter: Secondary | ICD-10-CM

## 2021-01-30 DIAGNOSIS — Z981 Arthrodesis status: Secondary | ICD-10-CM

## 2021-01-30 DIAGNOSIS — Z7984 Long term (current) use of oral hypoglycemic drugs: Secondary | ICD-10-CM

## 2021-01-30 DIAGNOSIS — T380X5A Adverse effect of glucocorticoids and synthetic analogues, initial encounter: Secondary | ICD-10-CM | POA: Diagnosis present

## 2021-01-30 DIAGNOSIS — E119 Type 2 diabetes mellitus without complications: Secondary | ICD-10-CM | POA: Diagnosis present

## 2021-01-30 DIAGNOSIS — Z87891 Personal history of nicotine dependence: Secondary | ICD-10-CM | POA: Diagnosis not present

## 2021-01-30 DIAGNOSIS — F09 Unspecified mental disorder due to known physiological condition: Secondary | ICD-10-CM | POA: Diagnosis present

## 2021-01-30 DIAGNOSIS — E038 Other specified hypothyroidism: Secondary | ICD-10-CM | POA: Diagnosis not present

## 2021-01-30 DIAGNOSIS — Z885 Allergy status to narcotic agent status: Secondary | ICD-10-CM

## 2021-01-30 DIAGNOSIS — R112 Nausea with vomiting, unspecified: Secondary | ICD-10-CM | POA: Diagnosis not present

## 2021-01-30 DIAGNOSIS — Z634 Disappearance and death of family member: Secondary | ICD-10-CM

## 2021-01-30 DIAGNOSIS — G8929 Other chronic pain: Secondary | ICD-10-CM | POA: Diagnosis present

## 2021-01-30 DIAGNOSIS — J45901 Unspecified asthma with (acute) exacerbation: Secondary | ICD-10-CM | POA: Diagnosis present

## 2021-01-30 DIAGNOSIS — F32A Depression, unspecified: Secondary | ICD-10-CM | POA: Diagnosis not present

## 2021-01-30 DIAGNOSIS — Z79890 Hormone replacement therapy (postmenopausal): Secondary | ICD-10-CM

## 2021-01-30 DIAGNOSIS — K2100 Gastro-esophageal reflux disease with esophagitis, without bleeding: Secondary | ICD-10-CM | POA: Diagnosis present

## 2021-01-30 DIAGNOSIS — R1110 Vomiting, unspecified: Secondary | ICD-10-CM | POA: Diagnosis not present

## 2021-01-30 DIAGNOSIS — T40601A Poisoning by unspecified narcotics, accidental (unintentional), initial encounter: Secondary | ICD-10-CM | POA: Diagnosis not present

## 2021-01-30 DIAGNOSIS — R4182 Altered mental status, unspecified: Secondary | ICD-10-CM | POA: Diagnosis not present

## 2021-01-30 DIAGNOSIS — R55 Syncope and collapse: Secondary | ICD-10-CM | POA: Diagnosis not present

## 2021-01-30 DIAGNOSIS — Z8669 Personal history of other diseases of the nervous system and sense organs: Secondary | ICD-10-CM | POA: Diagnosis not present

## 2021-01-30 DIAGNOSIS — F419 Anxiety disorder, unspecified: Secondary | ICD-10-CM | POA: Diagnosis present

## 2021-01-30 DIAGNOSIS — Z20822 Contact with and (suspected) exposure to covid-19: Secondary | ICD-10-CM | POA: Diagnosis not present

## 2021-01-30 DIAGNOSIS — B009 Herpesviral infection, unspecified: Secondary | ICD-10-CM | POA: Diagnosis present

## 2021-01-30 DIAGNOSIS — J9601 Acute respiratory failure with hypoxia: Secondary | ICD-10-CM | POA: Diagnosis not present

## 2021-01-30 DIAGNOSIS — R0602 Shortness of breath: Secondary | ICD-10-CM

## 2021-01-30 DIAGNOSIS — R1310 Dysphagia, unspecified: Secondary | ICD-10-CM

## 2021-01-30 DIAGNOSIS — G9341 Metabolic encephalopathy: Secondary | ICD-10-CM | POA: Diagnosis present

## 2021-01-30 DIAGNOSIS — Z982 Presence of cerebrospinal fluid drainage device: Secondary | ICD-10-CM | POA: Diagnosis not present

## 2021-01-30 DIAGNOSIS — K219 Gastro-esophageal reflux disease without esophagitis: Secondary | ICD-10-CM | POA: Diagnosis present

## 2021-01-30 DIAGNOSIS — Z9049 Acquired absence of other specified parts of digestive tract: Secondary | ICD-10-CM

## 2021-01-30 DIAGNOSIS — Z79899 Other long term (current) drug therapy: Secondary | ICD-10-CM

## 2021-01-30 DIAGNOSIS — E559 Vitamin D deficiency, unspecified: Secondary | ICD-10-CM | POA: Diagnosis present

## 2021-01-30 DIAGNOSIS — J9602 Acute respiratory failure with hypercapnia: Secondary | ICD-10-CM | POA: Diagnosis not present

## 2021-01-30 DIAGNOSIS — E876 Hypokalemia: Secondary | ICD-10-CM | POA: Diagnosis present

## 2021-01-30 DIAGNOSIS — Q796 Ehlers-Danlos syndrome, unspecified: Secondary | ICD-10-CM | POA: Diagnosis not present

## 2021-01-30 DIAGNOSIS — G932 Benign intracranial hypertension: Secondary | ICD-10-CM | POA: Diagnosis present

## 2021-01-30 DIAGNOSIS — Z87828 Personal history of other (healed) physical injury and trauma: Secondary | ICD-10-CM | POA: Diagnosis not present

## 2021-01-30 DIAGNOSIS — E039 Hypothyroidism, unspecified: Secondary | ICD-10-CM | POA: Diagnosis present

## 2021-01-30 DIAGNOSIS — M954 Acquired deformity of chest and rib: Secondary | ICD-10-CM | POA: Diagnosis not present

## 2021-01-30 DIAGNOSIS — T402X1A Poisoning by other opioids, accidental (unintentional), initial encounter: Secondary | ICD-10-CM | POA: Diagnosis not present

## 2021-01-30 DIAGNOSIS — R9431 Abnormal electrocardiogram [ECG] [EKG]: Secondary | ICD-10-CM | POA: Diagnosis not present

## 2021-01-30 DIAGNOSIS — Z888 Allergy status to other drugs, medicaments and biological substances status: Secondary | ICD-10-CM

## 2021-01-30 LAB — BLOOD GAS, VENOUS
Acid-base deficit: 8.7 mmol/L — ABNORMAL HIGH (ref 0.0–2.0)
Acid-base deficit: 3.7 mmol/L — ABNORMAL HIGH (ref 0.0–2.0)
Bicarbonate: 23.9 mmol/L (ref 20.0–28.0)
Bicarbonate: 26.3 mmol/L (ref 20.0–28.0)
FIO2: 21
O2 Saturation: 43.4 %
O2 Saturation: 90.3 %
Patient temperature: 98.6
Patient temperature: 98.6
pCO2, Ven: 85.7 mmHg (ref 44.0–60.0)
pCO2, Ven: 75.3 mmHg (ref 44.0–60.0)
pH, Ven: 7.073 — CL (ref 7.250–7.430)
pH, Ven: 7.168 — CL (ref 7.250–7.430)
pO2, Ven: 31.8 mmHg — CL (ref 32.0–45.0)
pO2, Ven: 67.9 mmHg — ABNORMAL HIGH (ref 32.0–45.0)

## 2021-01-30 LAB — TSH: TSH: 16.268 u[IU]/mL — ABNORMAL HIGH (ref 0.350–4.500)

## 2021-01-30 LAB — COMPREHENSIVE METABOLIC PANEL
ALT: 20 U/L (ref 0–44)
AST: 35 U/L (ref 15–41)
Albumin: 5.1 g/dL — ABNORMAL HIGH (ref 3.5–5.0)
Alkaline Phosphatase: 61 U/L (ref 38–126)
Anion gap: 12 (ref 5–15)
BUN: 12 mg/dL (ref 6–20)
CO2: 25 mmol/L (ref 22–32)
Calcium: 9 mg/dL (ref 8.9–10.3)
Chloride: 100 mmol/L (ref 98–111)
Creatinine, Ser: 1.14 mg/dL — ABNORMAL HIGH (ref 0.44–1.00)
GFR, Estimated: 57 mL/min — ABNORMAL LOW (ref >60)
Glucose, Bld: 176 mg/dL — ABNORMAL HIGH (ref 70–99)
Potassium: 4.2 mmol/L (ref 3.5–5.1)
Sodium: 137 mmol/L (ref 135–145)
Total Bilirubin: 0.8 mg/dL (ref 0.3–1.2)
Total Protein: 8.4 g/dL — ABNORMAL HIGH (ref 6.5–8.1)

## 2021-01-30 LAB — HIV ANTIBODY (ROUTINE TESTING W REFLEX): HIV Screen 4th Generation wRfx: NONREACTIVE

## 2021-01-30 LAB — GLUCOSE, CAPILLARY
Glucose-Capillary: 91 mg/dL (ref 70–99)
Glucose-Capillary: 105 mg/dL — ABNORMAL HIGH (ref 70–99)

## 2021-01-30 LAB — CBC WITH DIFFERENTIAL/PLATELET
Abs Immature Granulocytes: 0.37 10*3/uL — ABNORMAL HIGH (ref 0.00–0.07)
Basophils Absolute: 0.1 10*3/uL (ref 0.0–0.1)
Basophils Relative: 0 %
Eosinophils Absolute: 0.1 10*3/uL (ref 0.0–0.5)
Eosinophils Relative: 0 %
HCT: 43 % (ref 36.0–46.0)
Hemoglobin: 13.9 g/dL (ref 12.0–15.0)
Immature Granulocytes: 1 %
Lymphocytes Relative: 4 %
Lymphs Abs: 1.1 10*3/uL (ref 0.7–4.0)
MCH: 29.7 pg (ref 26.0–34.0)
MCHC: 32.3 g/dL (ref 30.0–36.0)
MCV: 91.9 fL (ref 80.0–100.0)
Monocytes Absolute: 0.8 10*3/uL (ref 0.1–1.0)
Monocytes Relative: 3 %
Neutro Abs: 23.6 10*3/uL — ABNORMAL HIGH (ref 1.7–7.7)
Neutrophils Relative %: 92 %
Platelets: 268 10*3/uL (ref 150–400)
RBC: 4.68 MIL/uL (ref 3.87–5.11)
RDW: 13 % (ref 11.5–15.5)
WBC: 26.1 10*3/uL — ABNORMAL HIGH (ref 4.0–10.5)
nRBC: 0 % (ref 0.0–0.2)

## 2021-01-30 LAB — RESP PANEL BY RT-PCR (FLU A&B, COVID) ARPGX2
Influenza A by PCR: NEGATIVE
Influenza B by PCR: NEGATIVE
SARS Coronavirus 2 by RT PCR: NEGATIVE

## 2021-01-30 LAB — ETHANOL: Alcohol, Ethyl (B): <10 mg/dL (ref <10)

## 2021-01-30 LAB — CBG MONITORING, ED: Glucose-Capillary: 207 mg/dL — ABNORMAL HIGH (ref 70–99)

## 2021-01-30 LAB — I-STAT BETA HCG BLOOD, ED (MC, WL, AP ONLY): I-stat hCG, quantitative: <5 m[IU]/mL (ref <5)

## 2021-01-30 LAB — T4, FREE: Free T4: 1.13 ng/dL — ABNORMAL HIGH (ref 0.61–1.12)

## 2021-01-30 MED ORDER — BUSPIRONE HCL 5 MG PO TABS
7.5000 mg | ORAL_TABLET | Freq: Two times a day (BID) | ORAL | Status: DC
  Administered 2021-01-31 – 2021-02-05 (×10): 7.5 mg via ORAL
  Filled 2021-01-30 (×10): qty 2

## 2021-01-30 MED ORDER — LEVOTHYROXINE SODIUM 25 MCG PO TABS
137.0000 ug | ORAL_TABLET | Freq: Every morning | ORAL | Status: DC
  Administered 2021-01-31 – 2021-02-05 (×5): 137 ug via ORAL
  Filled 2021-01-30 (×6): qty 1

## 2021-01-30 MED ORDER — NALOXONE HCL 0.4 MG/ML IJ SOLN: 0.4000 mg | INTRAMUSCULAR | Status: DC | PRN

## 2021-01-30 MED ORDER — POLYETHYLENE GLYCOL 3350 17 G PO PACK: 17.0000 g | PACK | Freq: Every day | ORAL | Status: DC | PRN

## 2021-01-30 MED ORDER — INSULIN ASPART 100 UNIT/ML IJ SOLN
0.0000 [IU] | Freq: Three times a day (TID) | INTRAMUSCULAR | Status: DC
  Administered 2021-01-31: 1 [IU] via SUBCUTANEOUS

## 2021-01-30 MED ORDER — ONDANSETRON HCL 4 MG/2ML IJ SOLN: 4.0000 mg | Freq: Four times a day (QID) | INTRAMUSCULAR | Status: DC | PRN

## 2021-01-30 MED ORDER — INSULIN ASPART 100 UNIT/ML IJ SOLN: 0.0000 [IU] | Freq: Every day | INTRAMUSCULAR | Status: DC

## 2021-01-30 MED ORDER — ONDANSETRON HCL 4 MG PO TABS: 4.0000 mg | ORAL_TABLET | Freq: Four times a day (QID) | ORAL | Status: DC | PRN

## 2021-01-30 MED ORDER — ENOXAPARIN SODIUM 40 MG/0.4ML IJ SOSY
40.0000 mg | PREFILLED_SYRINGE | INTRAMUSCULAR | Status: DC
  Administered 2021-01-30 – 2021-02-04 (×5): 40 mg via SUBCUTANEOUS
  Filled 2021-01-30 (×6): qty 0.4

## 2021-01-30 MED ORDER — SODIUM CHLORIDE 0.9 % IV BOLUS
500.0000 mL | Freq: Once | INTRAVENOUS | Status: AC
  Administered 2021-01-30: 500 mL via INTRAVENOUS

## 2021-01-30 MED ORDER — ACETAMINOPHEN 325 MG PO TABS
650.0000 mg | ORAL_TABLET | Freq: Four times a day (QID) | ORAL | Status: DC | PRN
  Administered 2021-02-03: 21:00:00 650 mg via ORAL
  Filled 2021-01-30: qty 2

## 2021-01-30 MED ORDER — ACETAMINOPHEN 650 MG RE SUPP: 650.0000 mg | Freq: Four times a day (QID) | RECTAL | Status: DC | PRN

## 2021-01-30 MED ORDER — ALBUTEROL SULFATE (2.5 MG/3ML) 0.083% IN NEBU
2.5000 mg | INHALATION_SOLUTION | RESPIRATORY_TRACT | Status: DC | PRN
  Administered 2021-01-31: 2.5 mg via RESPIRATORY_TRACT
  Filled 2021-01-30 (×3): qty 3

## 2021-01-30 NOTE — ED Notes (Signed)
Sister at bedside. Informed patient is being admitted

## 2021-01-30 NOTE — H&P (Addendum)
History and Physical    April Meyer MPN:[id number] DOB: 04-24-1965 DOA: 01/30/2021  PCP: Hayden Rasmussen, MD  Patient coming from: Home via EMS  I have personally briefly reviewed patient's old medical records in [place]  Chief Complaint: Altered mental status; nonresponsive  HPI: April Meyer is a 55 y.o. female with medical history significant of Ehlers-Danlos syndrome, spinal cord AVM, idiopathic intracranial hypertension s/p VP shunt 2020, Arnold-Chari D formation s/p decompression, migraine headache, chronic pain, hypothyroidism, type 2 diabetes mellitus, GERD who presented to [facility] ED on 11/23 after being found down outside at home by neighbors.  EMS was activated, patient was noted to be hypothermic with pinpoint pupils and unresponsive and not breathing well in by EMS and was given intranasal Narcan and placed on a nonrebreather and transported to the ED for further evaluation.  Patient's sister is present at bedside who contributes to the HPI.  Sister lives approximately 3 hours away but talks with her sister about 7-8 times per day via phone call or text message.  Talk to her this morning around 7:00 with no significant concerns.  Patient states does not recall the events this morning and states she has not suicidal.  Denies taking her oxycodone and attempt to hurt herself.  Patient currently on oxycodone 10 mg tablets, filled on 01/03/2021 with 150 tablets, currently has 86.5 pills remaining.  ED Course: Temperature 91.1 F, HR 82, RR 21, BP 03/29/1973, SPO2 100% on NRB.  ABG pH 7.073, PaCO2 85.7, PaO2 31.8.  Sodium 137, potassium 4.2, chloride 100, CO2 25, glucose 176, BUN 12, creatinine 1.14, AST 35, ALT 20, total bilirubin 0.8.  WBC 26.1, hemoglobin 13.9, platelets 268.  COVID-19 PCR negative peer influenza A/B PCR negative.  TSH is 16.286.  hCG less than 5.0.  EtOH less than 10.  CT head without contrast with no acute intracranial abnormality.  Chest x-ray with no evidence of acute  cardiopulmonary disease process.  Patient was placed on a Bair hugger, started on BiPAP.  Patient's mental status improved following Narcan by EMS.  Hospital service consulted for further evaluation management of unintentional narcotic overdose.  Review of Systems: Constitutional - No Fatigue, No Weight Loss Vision - No impaired vision, decreased visual acuity Ear/Nose/Mouth/Throat - No decreased hearing, no congestion Respiratory - + shortness of breath, no exertional dyspnea, chronic cough Cardiovascular - No chest pain, no palpitations, no peripheral edema Gastrointestinal - No nausea, no diarrhea, no constipation, Genitourinary - No excessive urination, no urinary incontinence Integumentary - No rashes or concerning skin lesions Neurologic - No numbness, no tingling, no dizziness, no headaches, + confusion   Past Medical History:  Diagnosis Date   ADHD (attention deficit hyperactivity disorder)    no meds for   Anxiety    Arnold-Chiari deformity (HCC)    Asthma    AVM (arteriovenous malformation) spine 04/21/2018   Bruises easily    Cerebral venous sinus thrombosis, remote, resolved 03/28/2013   Chronic pain    Common migraine with intractable migraine 0/10/6759   Complication of anesthesia age 54    breast lumpectomy heart rate went way down in hospital stayed 2 days, no problems since   Complication of anesthesia    C 1 to C 3 fusion side to side and up and down limited    EDS (Ehlers-Danlos syndrome)    Eustachian tube dysfunction    Fatigue    GERD (gastroesophageal reflux disease)    Glioma (HCC)    low  graded tectal plate glioma   Hemorrhoids    History of sleep apnea    no cpap used last 2 years, normal sleep study 2 yrs ago   HSV infection    Hypothyroid    Hypothyroidism    IIH (idiopathic intracranial hypertension)    Lumbar disc herniation    Migraine    Neck stiffness    Nystagmus, positional, central type 03/28/2013   Other malaise and fatigue 03/28/2013    " The patient reports feeling happy, ED, sore" on she has undergone at Chiari malformation surgery decompression in 2011. Prior to the surgery were chief complaints were weakness numbness and neck problems. She had swallowing difficulties and dizziness;  Classic Chiari presentation. But she didn't have headaches.   Photophobia    Pre-diabetes    Tinnitus of both ears    Urinary incontinence    Vision disturbance    Vitamin D deficiency    Weakness    Wears glasses     Past Surgical History:  Procedure Laterality Date   ARNOLD CHIARI REPAIR  2010   Brain shunt  12/30/2019   vp shunt   BRAIN SURGERY     BREAST BIOPSY Left    lumpectomy age 11    BREAST EXCISIONAL BIOPSY Right yrs ago   CHOLECYSTECTOMY  age 69   laparoscopic   colonscopy  2012, 2018   DILATION AND CURETTAGE OF UTERUS  last done yrs ago   x 2 or 3   ENDOMETRIAL ABLATION  02/09/2013   [facility]   HYSTEROSCOPY WITH D & C N/A 06/26/2020   Procedure: DILATATION AND CURETTAGE /HYSTEROSCOPY;  Surgeon: Salvadore Dom, MD;  Location: [facility];  Service: Gynecology;  Laterality: N/A;   INTRAUTERINE DEVICE INSERTION  10/2010   Mirena   IUD REMOVAL  11/2010   could not tolerate hormonal side effects   metal plate removed from head  2015   screw came loose   neck fusion c1-c3  2010   OPERATIVE ULTRASOUND N/A 06/26/2020   Procedure: OPERATIVE ULTRASOUND;  Surgeon: Salvadore Dom, MD;  Location: [facility];  Service: Gynecology;  Laterality: N/A;   TONSILLECTOMY AND ADENOIDECTOMY  age 37   UPPER GI ENDOSCOPY  2012, 2018     reports that she quit smoking about 13 years ago. Her smoking use included cigarettes. She has a 42.00 pack-year smoking history. She has never used smokeless tobacco. She reports current drug use. Drug: Marijuana. She reports that she does not drink alcohol.  Allergies  Allergen Reactions   Other Anaphylaxis    Steroids and Zucchini    Prednisone Anaphylaxis    Morphine And Related Itching    Severe    Family History  Problem Relation Age of Onset   Aortic dissection Father    Diabetes Maternal Aunt    Breast cancer Maternal Aunt        40's   Diabetes Maternal Uncle    Breast cancer Paternal Aunt 19   Diabetes Maternal Grandmother    Cancer Maternal Grandmother        SKIN AND LUNG   Diabetes Paternal Grandmother    Breast cancer Maternal Aunt        50's   Breast cancer Maternal Aunt        50's    Family history reviewed and not pertinent   Prior to Admission medications   Medication Sig Start Date End Date Taking? Authorizing Provider  albuterol (VENTOLIN HFA) 108 (  90 Base) MCG/ACT inhaler Inhale 1-2 puffs into the lungs every 4 (four) hours as needed for wheezing or shortness of breath.    [provider]  cyclobenzaprine (FLEXERIL) 5 MG tablet Take 5 mg by mouth 3 (three) times daily as needed for muscle spasms.    [provider]  dexlansoprazole (DEXILANT) 60 MG capsule Take 60 mg by mouth at bedtime.    [provider]  lamoTRIgine (LAMICTAL) 25 MG tablet daily. 04/14/20   [provider]  levothyroxine (SYNTHROID) 150 MCG tablet Take 150 mcg by mouth daily before breakfast.    [provider]  loratadine (CLARITIN) 10 MG tablet Take 10 mg by mouth daily.    [provider]  metFORMIN (GLUCOPHAGE-XR) 500 MG 24 hr tablet Take 500 mg by mouth daily. 03/12/20   [provider]  ondansetron (ZOFRAN) 4 MG tablet Take 1 tablet (4 mg total) by mouth every 8 (eight) hours as needed for nausea or vomiting. 08/12/18   Fontaine, Belinda Block, MD  Oxycodone HCl 10 MG TABS Take 10 mg by mouth every 4 (four) hours as needed (pain.).     [provider]  promethazine (PHENERGAN) 25 MG tablet Take 1 tablet (25 mg total) by mouth every 6 (six) hours as needed for nausea. Nausea 09/05/20   Salvadore Dom, MD  UNABLE TO FIND Cbd gummies 1 in am Cbd sleep gummy at hs     [provider]  valACYclovir (VALTREX) 1000 MG tablet TAKE 2 TABS (2 GRAMS) BY MOUTH NOW AND REPEAT IN 12 HOURS X 1, AS NEEDED FOR [place] 10/30/20   Salvadore Dom, MD    Physical Exam: Vitals:   01/30/21 1445 01/30/21 1450 01/30/21 1500 01/30/21 1530  BP: 119/78  131/83 112/73  Pulse: 90 89 90 85  Resp: 12 11 11 16   Temp:  (!) 96.9 F (36.1 C)    TempSrc:  Oral    SpO2: 93% 92% 92% 95%    Constitutional: NAD, calm, comfortable, BiPAP Eyes: PERRL, lids and conjunctivae normal ENMT: Mucous membranes are moist. Posterior pharynx clear of any exudate or lesions.Normal dentition.  Neck: normal, supple, no masses, no thyromegaly Respiratory: clear to auscultation bilaterally, no wheezing, no crackles. Normal respiratory effort. No accessory muscle use.  On BiPAP Cardiovascular: Regular rate and rhythm, no murmurs / rubs / gallops. No extremity edema. 2+ pedal pulses. No carotid bruits.  Abdomen: no tenderness, no masses palpated. No hepatosplenomegaly. Bowel sounds positive.  Musculoskeletal: no clubbing / cyanosis. No joint deformity upper and lower extremities. Good ROM, no contractures. Normal muscle tone.  Skin: no rashes, lesions, ulcers. No induration Neurologic: CN 2-12 grossly intact. Sensation intact, DTR normal. Strength 5/5 in all 4.  Psychiatric: Normal judgment and insight. Alert and oriented x 3. Normal mood.    Labs on Admission: I have personally reviewed following labs and imaging studies  CBC: Recent Labs  Lab 01/30/21 1140  WBC 26.1*  NEUTROABS 23.6*  HGB 13.9  HCT 43.0  MCV 91.9  PLT 720   Basic Metabolic Panel: Recent Labs  Lab 01/30/21 1140  NA 137  K 4.2  CL 100  CO2 25  GLUCOSE 176*  BUN 12  CREATININE 1.14*  CALCIUM 9.0   GFR: CrCl cannot be calculated (Unknown ideal weight.). Liver Function Tests: Recent Labs  Lab 01/30/21 1140  AST 35  ALT 20  ALKPHOS 61  BILITOT 0.8  PROT 8.4*  ALBUMIN 5.1*   No results for  input(s): LIPASE, AMYLASE in the last 168 hours. No results for input(s): AMMONIA in the last 168 hours. Coagulation Profile: No results for input(s): INR, PROTIME in the last 168 hours. Cardiac Enzymes: No results for input(s): CKTOTAL, CKMB, CKMBINDEX, TROPONINI in the last 168 hours. BNP (last 3 results) No results for input(s): PROBNP in the last 8760 hours. HbA1C: No results for input(s): HGBA1C in the last 72 hours. CBG: Recent Labs  Lab 01/30/21 1122  GLUCAP 207*   Lipid Profile: No results for input(s): CHOL, HDL, LDLCALC, TRIG, CHOLHDL, LDLDIRECT in the last 72 hours. Thyroid Function Tests: Recent Labs    01/30/21 1140  TSH 16.268*   Anemia Panel: No results for input(s): VITAMINB12, FOLATE, FERRITIN, TIBC, IRON, RETICCTPCT in the last 72 hours. Urine analysis:    Component Value Date/Time   COLORURINE YELLOW 05/01/2020 1015   APPEARANCEUR CLEAR 05/01/2020 1015   LABSPEC 1.010 05/01/2020 1015   PHURINE 6.0 05/01/2020 1015   GLUCOSEU NEGATIVE 05/01/2020 1015   HGBUR TRACE (A) 05/01/2020 1015   BILIRUBINUR NEGATIVE 01/02/2019 2052   KETONESUR NEGATIVE 05/01/2020 1015   PROTEINUR NEGATIVE 05/01/2020 1015   UROBILINOGEN 0.2 12/17/2012 0926   NITRITE NEGATIVE 01/02/2019 2052   LEUKOCYTESUR NEGATIVE 01/02/2019 2052    Radiological Exams on Admission: CT Head Wo Contrast  Result Date: 01/30/2021 CLINICAL DATA:  Altered mental status. EXAM: CT HEAD WITHOUT CONTRAST TECHNIQUE: Contiguous axial images were obtained from the base of the skull through the vertex without intravenous contrast. COMPARISON:  June 26, 2017. FINDINGS: Brain: Right frontal ventriculostomy catheter is noted with tip in right lateral ventricle. No mass effect or midline shift is noted. Ventricular size is within normal limits. There is no evidence of mass lesion, hemorrhage or acute infarction. Vascular: No hyperdense vessel or unexpected calcification. Skull: Right frontal ventriculostomy is  noted. No definite fracture is noted. Sinuses/Orbits: No acute finding. Other: None. IMPRESSION: No acute intracranial abnormality seen. Electronically Signed   By: Marijo Conception M.D.   On: 01/30/2021 14:07   DG Chest Port 1 View  Result Date: 01/30/2021 CLINICAL DATA:  Patient found down, unresponsive with pinpoint pupils. Hypothermia. EXAM: PORTABLE CHEST 1 VIEW COMPARISON:  No prior chest radiographs available. Abdominal CT 01/03/2019. FINDINGS: 1409 hours. The heart size is at the upper limits of normal for portable semi erect technique. Ventricular peritoneal shunt overlies the right chest. The lungs appear clear. There is no pleural effusion or pneumothorax. There is a defect in the left 5th rib posteriorly which may be postsurgical. No acute osseous findings are evident. Telemetry leads overlie the chest. IMPRESSION: No evidence of active cardiopulmonary process. Left 5th rib deformity, likely postsurgical. Electronically Signed   By: Richardean Sale M.D.   On: 01/30/2021 14:14    EKG: Independently reviewed.   Assessment/Plan Principal Problem:   Narcotic overdose ([place]) Active Problems:   History of Chiari malformation   Hypothyroid   DM2 (diabetes mellitus, type 2) (HCC)   GERD (gastroesophageal reflux disease)   Acute hypoxic/hypercapnic respiratory failure, POA On EMS arrival, patient was noted to be obtunded, with poor respiratory effort.  Placed on nonrebreather etiology likely secondary to narcotic effect that improved with Narcan administration.  Initial pH 7.073, PaCO2 85.7.  COVID-19/influenza A/B PCR negative.  Chest x-ray with no acute cardiopulmonary disease process. --Continue BiPAP nightly and while sleeping --Repeat VBG in the a.m. --Continue to titrate oxygen to maintain SPO2 greater than 92%  Narcotic overdose, unintentional Patient with history of chronic pain, on  oxycodone outpatient.  Per sister, no recent changes in medications or doses.  Has apparently been  trying to decrease amount of narcotics she uses.  Patient denies intent to hurt herself.  Heaved 2 of Narcan intranasally by EMS. --Holding home narcotics --Continue monitor on telemetry, continuous pulse oximetry --Narcan as needed  Leukocytosis WBC count 26.1 on arrival.  No infectious etiology.  Chest x-ray within normal limits.  Etiology likely reactive. --Repeat CBC in a.m.  Hypothyroidism TSH elevated 16.268. --Check free T4 --Continue home levothyroxine 137 mcg p.o. daily  Depression/anxiety: Continue BuSpar.  Type 2 diabetes mellitus On metformin at baseline. --Check hemoglobin A1c --SSI for coverage --CBGs qAC/HS  Hx idiopathic intracranial hypertension Follows with neurosurgery outpatient.  S/p VP shunt.  CT head with right frontal ventriculostomy catheter noted with tip in the right lateral ventricle with no mass or midline shift and ventricular size within normal limits. --Outpatient follow-up with neurosurgery    DVT prophylaxis:   Lovenox Code Status: Full code Family Communication: Sister present at bedside Disposition Plan: Anticipate discharge home when medically stable Consults called: None Admission status: Inpatient Level of care: Progressive   Severity of Illness: The appropriate patient status for this patient is INPATIENT. Inpatient status is judged to be reasonable and necessary in order to provide the required intensity of service to ensure the patient's safety. The patient's presenting symptoms, physical exam findings, and initial radiographic and laboratory data in the context of their chronic comorbidities is felt to place them at high risk for further clinical deterioration. Furthermore, it is not anticipated that the patient will be medically stable for discharge from the hospital within 2 midnights of admission. The following factors support the patient status of inpatient.   " The patient's presenting symptoms include found down with pinpoint  pupils, hypothermic " The worrisome physical exam findings include pinpoint pupils, altered mental status, requiring BiPAP " The initial radiographic and laboratory data are worrisome because of elevated WBC count 26.1, PCO2 85.7, TSH 16.268 " The chronic co-morbidities include hypothyroidism, GERD, chronic pain, type 2 diabetes mellitus.   * I certify that at the point of admission it is my clinical judgment that the patient will require inpatient hospital care spanning beyond 2 midnights from the point of admission due to high intensity of service, high risk for further deterioration and high frequency of surveillance required.*    Adeleine Pask J [place] DO [facility] Available via Epic secure chat 7am-7pm After these hours, please refer to coverage provider listed on [email or web address] 01/30/2021, 4:19 PM

## 2021-01-30 NOTE — ED Notes (Signed)
BairHugger applied and bed alarm on.

## 2021-01-30 NOTE — ED Notes (Signed)
Patient found w/ 86.5 pills out of 150 from Oxycodone script at 1XQ5 that was filled 01/03/21. Patient states they took 3-4 earlier for pain from a rib popping out.

## 2021-01-30 NOTE — ED Notes (Signed)
Respiratory called for assistance with pt transport to the floor on bipap

## 2021-01-30 NOTE — ED Triage Notes (Signed)
BIBA. Per EMS Patient found down outside, and non-responsive with pinpoint pupils. Last seen well at 0630 this morningGiven 2mg  narcan and put on 15L NRB satting at 88%. Hypothermic.  BP: 162/98 HR: 110 SPO2: 88%  CBG: 343

## 2021-01-30 NOTE — ED Notes (Signed)
Updated rectal temp charted. Bair hugger has been removed

## 2021-01-30 NOTE — ED Notes (Signed)
Patient meds sent home with sister [name].

## 2021-01-30 NOTE — ED Provider Notes (Signed)
[facility] DEPT Provider Note   CSN: [id number] Arrival date & time: 01/30/21  1103     History Chief Complaint  Patient presents with   Drug Overdose    hy   Cold Exposure    Giovanni B Labreck is a 55 y.o. female presenting for presumed drug overdose.  Level 5 caveat due to altered mental status.  History provided by EMS.  Per EMS, neighbors found patient outside next to her lawn chair not breathing.  On fire arrival, patient was given 2 mg of Narcan IN, she became alert.  Patient was hypoxic at 50% at that time.  With EMS, patient has remained alert, although somewhat agitated and confused.  On nonrebreather, satting at 88%, but not compliant with BVM. Improving mental status dueing transport.   Patient cannot tell me anything about what happened this morning.  She states she is not suicidal, and did not take her oxycodone in an attempt to kill herself.  She is not homicidal.  Patient is on oxycodone 10 mg tablets.  Last bottle was filled on 10-27, with 150 tablets.  She has 86.5 pills left.  Additional history obtained per chart review.  History of Ehlers-Danlos, anxiety, GERD, glioma, ADHD, chronic pain, migraine, prediabetes, hypothyroidism, Arnold-Chiari deformation status post decompression   HPI     Past Medical History:  Diagnosis Date   ADHD (attention deficit hyperactivity disorder)    no meds for   Anxiety    Arnold-Chiari deformity (HCC)    Asthma    AVM (arteriovenous malformation) spine 04/21/2018   Bruises easily    Cerebral venous sinus thrombosis, remote, resolved 03/28/2013   Chronic pain    Common migraine with intractable migraine 4/40/1027   Complication of anesthesia age 53    breast lumpectomy heart rate went way down in hospital stayed 2 days, no problems since   Complication of anesthesia    C 1 to C 3 fusion side to side and up and down limited    EDS (Ehlers-Danlos syndrome)    Eustachian tube dysfunction    Fatigue     GERD (gastroesophageal reflux disease)    Glioma (HCC)    low graded tectal plate glioma   Hemorrhoids    History of sleep apnea    no cpap used last 2 years, normal sleep study 2 yrs ago   HSV infection    Hypothyroid    Hypothyroidism    IIH (idiopathic intracranial hypertension)    Lumbar disc herniation    Migraine    Neck stiffness    Nystagmus, positional, central type 03/28/2013   Other malaise and fatigue 03/28/2013   " The patient reports feeling happy, ED, sore" on she has undergone at Chiari malformation surgery decompression in 2011. Prior to the surgery were chief complaints were weakness numbness and neck problems. She had swallowing difficulties and dizziness;  Classic Chiari presentation. But she didn't have headaches.   Photophobia    Pre-diabetes    Tinnitus of both ears    Urinary incontinence    Vision disturbance    Vitamin D deficiency    Weakness    Wears glasses     Patient Active Problem List   Diagnosis Date Noted   S/P VP shunt 06/19/2020   Attention deficit hyperactivity disorder 06/12/2020   Chronic low back pain 06/12/2020   Chronic obstructive pulmonary disease, unspecified ([place]) 06/12/2020   Compression of brain ([place]) 06/12/2020   Ehlers-Danlos syndrome, unspecified 06/12/2020   Encounter  for general adult medical examination without abnormal findings 06/12/2020   Prediabetes    Hypothyroid    AVM (arteriovenous malformation) spine 04/21/2018   Common migraine with intractable migraine 04/21/2018   Other malaise and fatigue 03/28/2013   History of Chiari malformation 03/28/2013   Hypersomnia, persistent 03/28/2013   Cerebral venous sinus thrombosis, remote, resolved 03/28/2013   Nystagmus, positional, central type 03/28/2013   Menorrhagia 07/31/2011    Past Surgical History:  Procedure Laterality Date   ARNOLD CHIARI REPAIR  2010   Brain shunt  12/30/2019   vp shunt   BRAIN SURGERY     BREAST BIOPSY Left    lumpectomy age 72     BREAST EXCISIONAL BIOPSY Right yrs ago   CHOLECYSTECTOMY  age 34   laparoscopic   colonscopy  2012, 2018   DILATION AND CURETTAGE OF UTERUS  last done yrs ago   x 2 or 3   ENDOMETRIAL ABLATION  02/09/2013   [facility]   HYSTEROSCOPY WITH D & C N/A 06/26/2020   Procedure: DILATATION AND CURETTAGE /HYSTEROSCOPY;  Surgeon: Salvadore Dom, MD;  Location: [facility];  Service: Gynecology;  Laterality: N/A;   INTRAUTERINE DEVICE INSERTION  10/2010   Mirena   IUD REMOVAL  11/2010   could not tolerate hormonal side effects   metal plate removed from head  2015   screw came loose   neck fusion c1-c3  2010   OPERATIVE ULTRASOUND N/A 06/26/2020   Procedure: OPERATIVE ULTRASOUND;  Surgeon: Salvadore Dom, MD;  Location: [facility];  Service: Gynecology;  Laterality: N/A;   TONSILLECTOMY AND ADENOIDECTOMY  age 89   UPPER GI ENDOSCOPY  2012, 2018     OB History     Gravida  2   Para  1   Term  1   Preterm      AB  1   Living  1      SAB      IAB      Ectopic  1   Multiple      Live Births              Family History  Problem Relation Age of Onset   Aortic dissection Father    Diabetes Maternal Aunt    Breast cancer Maternal Aunt        40's   Diabetes Maternal Uncle    Breast cancer Paternal Aunt 51   Diabetes Maternal Grandmother    Cancer Maternal Grandmother        SKIN AND LUNG   Diabetes Paternal Grandmother    Breast cancer Maternal Aunt        50's   Breast cancer Maternal Aunt        50's    Social History   Tobacco Use   Smoking status: Former    Packs/day: 2.00    Years: 21.00    Pack years: 42.00    Types: Cigarettes    Quit date: 09/09/2007    Years since quitting: 13.4   Smokeless tobacco: Never  Vaping Use   Vaping Use: Never used  Substance Use Topics   Alcohol use: No    Comment: h/o binge drinking none in 10 years   Drug use: No    Home Medications Prior to Admission medications    Medication Sig Start Date End Date Taking? Authorizing Provider  albuterol (VENTOLIN HFA) 108 (90 Base) MCG/ACT inhaler Inhale 1-2 puffs into the lungs every 4 (  four) hours as needed for wheezing or shortness of breath.    [provider]  cyclobenzaprine (FLEXERIL) 5 MG tablet Take 5 mg by mouth 3 (three) times daily as needed for muscle spasms.    [provider]  dexlansoprazole (DEXILANT) 60 MG capsule Take 60 mg by mouth at bedtime.    [provider]  lamoTRIgine (LAMICTAL) 25 MG tablet daily. 04/14/20   [provider]  levothyroxine (SYNTHROID) 150 MCG tablet Take 150 mcg by mouth daily before breakfast.    [provider]  loratadine (CLARITIN) 10 MG tablet Take 10 mg by mouth daily.    [provider]  metFORMIN (GLUCOPHAGE-XR) 500 MG 24 hr tablet Take 500 mg by mouth daily. 03/12/20   [provider]  ondansetron (ZOFRAN) 4 MG tablet Take 1 tablet (4 mg total) by mouth every 8 (eight) hours as needed for nausea or vomiting. 08/12/18   Fontaine, Belinda Block, MD  Oxycodone HCl 10 MG TABS Take 10 mg by mouth every 4 (four) hours as needed (pain.).     [provider]  promethazine (PHENERGAN) 25 MG tablet Take 1 tablet (25 mg total) by mouth every 6 (six) hours as needed for nausea. Nausea 09/05/20   Salvadore Dom, MD  UNABLE TO FIND Cbd gummies 1 in am Cbd sleep gummy at hs    [provider]  valACYclovir (VALTREX) 1000 MG tablet TAKE 2 TABS (2 GRAMS) BY MOUTH NOW AND REPEAT IN 12 HOURS X 1, AS NEEDED FOR [place] 10/30/20   Salvadore Dom, MD    Allergies    Other, Prednisone, and Morphine and related  Review of Systems   Review of Systems  Unable to perform ROS: Mental status change   Physical Exam Updated Vital Signs BP 127/85 (BP Location: Right Arm)   Pulse 82   Temp (!) 91.1 F (32.8 C) (Rectal)   Resp (!) 21   SpO2 100%   Physical Exam Vitals and nursing note reviewed.   Constitutional:      General: She is not in acute distress.    Appearance: Normal appearance. She is obese.     Comments: Awake. Confused. Cold to the touch  HENT:     Head: Normocephalic and atraumatic.     Mouth/Throat:     Mouth: Mucous membranes are dry.  Eyes:     Extraocular Movements: Extraocular movements intact.     Conjunctiva/sclera: Conjunctivae normal.     Comments: pupils pinpoint  Cardiovascular:     Rate and Rhythm: Normal rate and regular rhythm.     Pulses: Normal pulses.  Pulmonary:     Effort: Pulmonary effort is normal. No respiratory distress.     Breath sounds: Normal breath sounds. No wheezing.     Comments: Speaking in full sentences.  Clear lung sounds in all fields. Cough noted.  Abdominal:     General: There is no distension.     Palpations: Abdomen is soft. There is no mass.     Tenderness: There is no abdominal tenderness. There is no guarding or rebound.  Musculoskeletal:        General: Normal range of motion.     Cervical back: Normal range of motion and neck supple.     Comments: Moving all extremities  Skin:    General: Skin is warm and dry.     Capillary Refill: Capillary refill takes less than 2 seconds.  Neurological:     Mental Status: She is  confused.     GCS: GCS eye subscore is 4. GCS verbal subscore is 5. GCS motor subscore is 6.     Comments: Oriented to person and place  Psychiatric:        Speech: Speech normal.        Behavior: Behavior is hyperactive.        Thought Content: Thought content does not include suicidal (denies) ideation.    ED Results / Procedures / Treatments   Labs (all labs ordered are listed, but only abnormal results are displayed) Labs Reviewed  CBG MONITORING, ED - Abnormal; Notable for the following components:      Result Value   Glucose-Capillary 207 (*)    All other components within normal limits  CBC WITH DIFFERENTIAL/PLATELET  COMPREHENSIVE METABOLIC PANEL  BLOOD GAS, VENOUS  RAPID URINE  DRUG SCREEN, HOSP PERFORMED  URINALYSIS, ROUTINE W REFLEX MICROSCOPIC  I-STAT BETA HCG BLOOD, ED ([facility], [facility], [facility] ONLY)    EKG None  Radiology No results found.  Procedures .Critical Care Performed by: Franchot Heidelberg, PA-C Authorized by: Franchot Heidelberg, PA-C   Critical care provider statement:    Critical care time (minutes):  40   Critical care time was exclusive of:  Separately billable procedures and treating other patients and teaching time   Critical care was necessary to treat or prevent imminent or life-threatening deterioration of the following conditions:  CNS failure or compromise and respiratory failure   Critical care was time spent personally by me on the following activities:  Blood draw for specimens, development of treatment plan with patient or surrogate, evaluation of patient's response to treatment, examination of patient, obtaining history from patient or surrogate, ordering and performing treatments and interventions, ordering and review of laboratory studies, ordering and review of radiographic studies, pulse oximetry, re-evaluation of patient's condition and review of old charts   I assumed direction of critical care for this patient from another provider in my specialty: no     Care discussed with: admitting provider     Medications Ordered in ED Medications - No data to display  ED Course  I have reviewed the triage vital signs and the nursing notes.  Pertinent labs & imaging results that were available during my care of the patient were reviewed by me and considered in my medical decision making (see chart for details).    MDM Rules/Calculators/A&P                           Patient presenting after being found unresponsive outside.  On arrival, patient is slightly agitated.  She is cool to the touch, rectal temperature of 91.  While her oxygenation is reassuring on nonrebreather at this time, in the setting of unknown downtime, will look for signs of  anoxic brain injury, aspiration pneumonia, acidosis.  Will look for coingestions.  Labs show elevated white count at 26, likely reactive.  VBG concerning for hypercarbia and acidosis.  As such, BiPAP started.  Creatinine minimally elevated from baseline, fluids given.  TSH elevated at 16.  However without hyponatremia and with improving mental status after Narcan, doubt myxedema coma.  Chest x-ray negative for acute findings.  CT head negative for acute findings.  On reevaluation, patient is sleepy, but easily arousable on bipap. Will continue to monitor.   Repeat rectal temperature improved at 96.  VBG to show hypercarbia and acidosis, but improved. Will keep on bipap. Pt will need to be admitted  for further evaluation.   Discussed with Dr. [place] from [facility] hospitalist service, pt to be admitted.   Final Clinical Impression(s) / ED Diagnoses Final diagnoses:  None    Rx / DC Orders ED Discharge Orders     None        Franchot Heidelberg, PA-C 01/30/21 1557    Godfrey Pick, MD 01/31/21 1329

## 2021-01-30 NOTE — ED Notes (Signed)
Rectal temp 97.7 bear hugger removed. Pt offered warm blanket

## 2021-01-31 ENCOUNTER — Other Ambulatory Visit: Payer: Self-pay

## 2021-01-31 ENCOUNTER — Inpatient Hospital Stay (HOSPITAL_COMMUNITY): Payer: PPO

## 2021-01-31 DIAGNOSIS — Z982 Presence of cerebrospinal fluid drainage device: Secondary | ICD-10-CM

## 2021-01-31 DIAGNOSIS — Q796 Ehlers-Danlos syndrome, unspecified: Secondary | ICD-10-CM

## 2021-01-31 DIAGNOSIS — T40601A Poisoning by unspecified narcotics, accidental (unintentional), initial encounter: Secondary | ICD-10-CM | POA: Diagnosis not present

## 2021-01-31 DIAGNOSIS — J9602 Acute respiratory failure with hypercapnia: Secondary | ICD-10-CM

## 2021-01-31 DIAGNOSIS — J9601 Acute respiratory failure with hypoxia: Secondary | ICD-10-CM

## 2021-01-31 LAB — URINALYSIS, ROUTINE W REFLEX MICROSCOPIC
Bilirubin Urine: NEGATIVE
Glucose, UA: NEGATIVE mg/dL
Ketones, ur: 20 mg/dL — AB
Leukocytes,Ua: NEGATIVE
Nitrite: NEGATIVE
Protein, ur: NEGATIVE mg/dL
Specific Gravity, Urine: 1.01 (ref 1.005–1.030)
pH: 5 (ref 5.0–8.0)

## 2021-01-31 LAB — BLOOD GAS, ARTERIAL
Acid-Base Excess: 0.2 mmol/L (ref 0.0–2.0)
Bicarbonate: 24 mmol/L (ref 20.0–28.0)
O2 Saturation: 95 %
Patient temperature: 98.6
pCO2 arterial: 38 mmHg (ref 32.0–48.0)
pH, Arterial: 7.417 (ref 7.350–7.450)
pO2, Arterial: 70.4 mmHg — ABNORMAL LOW (ref 83.0–108.0)

## 2021-01-31 LAB — CBC
HCT: 40.6 % (ref 36.0–46.0)
Hemoglobin: 13.3 g/dL (ref 12.0–15.0)
MCH: 30 pg (ref 26.0–34.0)
MCHC: 32.8 g/dL (ref 30.0–36.0)
MCV: 91.4 fL (ref 80.0–100.0)
Platelets: 274 10*3/uL (ref 150–400)
RBC: 4.44 MIL/uL (ref 3.87–5.11)
RDW: 13.2 % (ref 11.5–15.5)
WBC: 19.7 10*3/uL — ABNORMAL HIGH (ref 4.0–10.5)
nRBC: 0 % (ref 0.0–0.2)

## 2021-01-31 LAB — BLOOD GAS, VENOUS
Acid-base deficit: 2.9 mmol/L — ABNORMAL HIGH (ref 0.0–2.0)
Bicarbonate: 25.2 mmol/L (ref 20.0–28.0)
O2 Saturation: 86.6 %
Patient temperature: 98.6
pCO2, Ven: 61.4 mmHg — ABNORMAL HIGH (ref 44.0–60.0)
pH, Ven: 7.238 — ABNORMAL LOW (ref 7.250–7.430)
pO2, Ven: 53.5 mmHg — ABNORMAL HIGH (ref 32.0–45.0)

## 2021-01-31 LAB — COMPREHENSIVE METABOLIC PANEL
ALT: 18 U/L (ref 0–44)
AST: 24 U/L (ref 15–41)
Albumin: 4.7 g/dL (ref 3.5–5.0)
Alkaline Phosphatase: 56 U/L (ref 38–126)
Anion gap: 12 (ref 5–15)
BUN: 12 mg/dL (ref 6–20)
CO2: 24 mmol/L (ref 22–32)
Calcium: 9 mg/dL (ref 8.9–10.3)
Chloride: 102 mmol/L (ref 98–111)
Creatinine, Ser: 0.74 mg/dL (ref 0.44–1.00)
GFR, Estimated: >60 mL/min (ref >60)
Glucose, Bld: 91 mg/dL (ref 70–99)
Potassium: 4.5 mmol/L (ref 3.5–5.1)
Sodium: 138 mmol/L (ref 135–145)
Total Bilirubin: 0.9 mg/dL (ref 0.3–1.2)
Total Protein: 7.9 g/dL (ref 6.5–8.1)

## 2021-01-31 LAB — GLUCOSE, CAPILLARY
Glucose-Capillary: 105 mg/dL — ABNORMAL HIGH (ref 70–99)
Glucose-Capillary: 130 mg/dL — ABNORMAL HIGH (ref 70–99)
Glucose-Capillary: 92 mg/dL (ref 70–99)
Glucose-Capillary: 92 mg/dL (ref 70–99)

## 2021-01-31 LAB — HEMOGLOBIN A1C
Hgb A1c MFr Bld: 5.1 % (ref 4.8–5.6)
Mean Plasma Glucose: 99.67 mg/dL

## 2021-01-31 LAB — EXPECTORATED SPUTUM ASSESSMENT W GRAM STAIN, RFLX TO RESP C

## 2021-01-31 MED ORDER — HALOPERIDOL 1 MG PO TABS
1.0000 mg | ORAL_TABLET | Freq: Two times a day (BID) | ORAL | Status: DC
  Administered 2021-01-31 – 2021-02-02 (×4): 1 mg via ORAL
  Filled 2021-01-31 (×4): qty 1

## 2021-01-31 MED ORDER — DOXYCYCLINE HYCLATE 100 MG PO TABS
100.0000 mg | ORAL_TABLET | Freq: Two times a day (BID) | ORAL | Status: DC
  Administered 2021-01-31 – 2021-02-02 (×5): 100 mg via ORAL
  Filled 2021-01-31 (×5): qty 1

## 2021-01-31 MED ORDER — HALOPERIDOL LACTATE 5 MG/ML IJ SOLN: 1.0000 mg | Freq: Two times a day (BID) | INTRAMUSCULAR | Status: DC

## 2021-01-31 MED ORDER — GUAIFENESIN ER 600 MG PO TB12
600.0000 mg | ORAL_TABLET | Freq: Two times a day (BID) | ORAL | Status: DC
  Administered 2021-01-31 – 2021-02-05 (×10): 600 mg via ORAL
  Filled 2021-01-31 (×10): qty 1

## 2021-01-31 MED ORDER — LEVALBUTEROL HCL 0.63 MG/3ML IN NEBU: 0.6300 mg | INHALATION_SOLUTION | Freq: Three times a day (TID) | RESPIRATORY_TRACT | Status: DC

## 2021-01-31 MED ORDER — IPRATROPIUM BROMIDE 0.02 % IN SOLN: 0.5000 mg | Freq: Three times a day (TID) | RESPIRATORY_TRACT | Status: DC

## 2021-01-31 MED ORDER — DIPHENHYDRAMINE HCL 50 MG/ML IJ SOLN
12.5000 mg | Freq: Three times a day (TID) | INTRAMUSCULAR | Status: DC | PRN
  Administered 2021-01-31: 12.5 mg via INTRAVENOUS
  Filled 2021-01-31: qty 1

## 2021-01-31 MED ORDER — IPRATROPIUM BROMIDE 0.02 % IN SOLN
0.5000 mg | Freq: Three times a day (TID) | RESPIRATORY_TRACT | Status: DC
Start: 2021-01-31 — End: 2021-01-31

## 2021-01-31 MED ORDER — ALUM & MAG HYDROXIDE-SIMETH 200-200-20 MG/5ML PO SUSP
30.0000 mL | Freq: Four times a day (QID) | ORAL | Status: AC | PRN
  Administered 2021-02-01 (×2): 30 mL via ORAL
  Filled 2021-01-31 (×2): qty 30

## 2021-01-31 MED ORDER — IPRATROPIUM BROMIDE 0.02 % IN SOLN
0.5000 mg | Freq: Three times a day (TID) | RESPIRATORY_TRACT | Status: DC
Start: 2021-01-31 — End: 2021-02-03
  Administered 2021-01-31 – 2021-02-03 (×8): 0.5 mg via RESPIRATORY_TRACT
  Filled 2021-01-31 (×8): qty 2.5

## 2021-01-31 MED ORDER — QUETIAPINE FUMARATE 25 MG PO TABS
25.0000 mg | ORAL_TABLET | Freq: Two times a day (BID) | ORAL | Status: DC
  Filled 2021-01-31: qty 1

## 2021-01-31 MED ORDER — LEVALBUTEROL HCL 0.63 MG/3ML IN NEBU
0.6300 mg | INHALATION_SOLUTION | Freq: Three times a day (TID) | RESPIRATORY_TRACT | Status: DC
Start: 2021-01-31 — End: 2021-02-03
  Administered 2021-01-31 – 2021-02-03 (×8): 0.63 mg via RESPIRATORY_TRACT
  Filled 2021-01-31 (×8): qty 3

## 2021-01-31 MED ORDER — SODIUM CHLORIDE 0.9 % IV SOLN: INTRAVENOUS | Status: AC

## 2021-01-31 MED ORDER — IPRATROPIUM-ALBUTEROL 0.5-2.5 (3) MG/3ML IN SOLN
3.0000 mL | Freq: Four times a day (QID) | RESPIRATORY_TRACT | Status: DC
  Administered 2021-01-31: 3 mL via RESPIRATORY_TRACT
  Filled 2021-01-31: qty 3

## 2021-01-31 NOTE — Progress Notes (Signed)
During patient assessment, she showed RN two faint brown spots, nickel-sized, that she claims indicate she has a blood clot. She demands for a D. Dimer test after consulting her phone. She claims she has terrible heart burn from her water, bacon, and eggs going into her lungs when she swallows. She reports that shasta sodas make her cough up black and green substances. Will relay this information to on call provider during rounds. Of note, patient has no peripheral edema, no respiratory distress, no abnormal heart or lung sounds, and is becoming agitated whenever nurse says anything but "yes ma'am".

## 2021-01-31 NOTE — Progress Notes (Signed)
PROGRESS NOTE    LASASHA BROPHY  DUK:[id number] DOB: [birth year] DOA: 01/30/2021 PCP: Hayden Rasmussen, MD    Chief Complaint  Patient presents with   Drug Overdose    hy   Cold Exposure    Brief Narrative:   MALAJAH OCEGUERA is a 55 y.o. female with medical history significant of Ehlers-Danlos syndrome, spinal cord AVM, idiopathic intracranial hypertension s/p VP shunt 2020, Arnold-Chari D formation s/p decompression, migraine headache, chronic pain, hypothyroidism, type 2 diabetes mellitus, GERD who presented to [facility] ED on 11/23 after being found down outside at home by neighbors.  EMS was activated, patient was noted to be hypothermic with pinpoint pupils and unresponsive and not breathing well in by EMS and was given intranasal Narcan and placed on a nonrebreather and transported to the ED for further evaluation.  Patient's sister is present at bedside who contributes to the HPI.  Sister lives approximately 3 hours away but talks with her sister about 7-8 times per day via phone call or text message.  Talk to her this morning around 7:00 with no significant concerns.  Patient states does not recall the events this morning and states she has not suicidal.  Denies taking her oxycodone and attempt to hurt herself.  Patient currently on oxycodone 10 mg tablets, filled on 01/03/2021 with 150 tablets, currently has 86.5 pills remaining  Subjective:  She is off bipap, she did desat into 70's, now 95% on 3L though, very congested with productive cough  She is alert and interactive, appears to have tangential thoughts,  She is concerned about swallowing eggs and bacons into her lungs, " there is something going on" " my lungs are leaking " Reports h/o asthma, has been having productive cough for several days  Sister reports patient recent has RSV , took steroids inhalor then became delusional and hyperactive, sister reports patient has a history of steroids intolerance Sister thinks the narcotic overdose  was incidental  Assessment & Plan:   Principal Problem:   Narcotic overdose ([place]) Active Problems:   History of Chiari malformation   Ehlers-Danlos syndrome, unspecified   Hypothyroid   S/P VP shunt   DM2 (diabetes mellitus, type 2) (HCC)   GERD (gastroesophageal reflux disease)   Acute respiratory failure with hypoxia and hypercapnia (HCC)  Acute hypoxic/hypercapnic respiratory failure/acute metabolic encephalopathy Likely from narcotic overdose, she was put on BiPAP, mental status improving, however remain hypoxic requiring oxygen supplement  Asthma exacerbation? Intentional wheezing? She does has productive cough, cxr with bronchitis  Will check procalcitonin Sputum culture, start abx/nabx she is allergic to steroids  Narcotic overdose? Denies suicidal ideation Does has history depression/anxiety, sister report patient's only child passed away a month ago, patient mother passed away earlier this year Inpatient psychiatry consult order placed Needs UDS  Agitation/delusion? -her previous pcp said she could not tolerate seroquel,   -patient has tachycardia with albuterol, but tolerate xopenex -pcp states patient has rash with zithromax, but she did ok with augmentin except she needs diflucan when she takes augmentin in the past patient has a very good relationship with her previous pcp, her pcp called and talked to ms Chewning , able to get her agreeable to get labs tests and urine sample,   Leukocytosis Initial chest x-ray no acute findings, however this morning she has congested cough, suspect aspiration, sputum culture, repeat chest x-ray Check UA Blood culture Start hydration  Non-insulin-dependent type 2 diabetes Controlled, A1c 5.1 Home medication metformin 500 mg daily held  Carb modified diet  Chronic pain Present with narcotic overdose Hold chronic pain medication, patient desires to be taken off narcotics, patient already talked to her pcp and pain management As  needed Narcan for now  Hypothyroidism TSH 16 Free T4 1.13 Continue current dose of Synthroid   Hx idiopathic intracranial hypertension Follows with neurosurgery outpatient.  S/p VP shunt.  CT head with right frontal ventriculostomy catheter noted with tip in the right lateral ventricle with no mass or midline shift and ventricular size within normal limits. --Outpatient follow-up with neurosurgery  Unresulted Labs (From admission, onward)     Start     Ordered   02/06/21 0500  Creatinine, serum  (enoxaparin (LOVENOX)    CrCl >/= 30 ml/min)  Weekly,   R     Comments: while on enoxaparin therapy    01/30/21 1811   01/31/21 0845  Urinalysis, Routine w reflex microscopic  Once,   R        01/31/21 0844   01/30/21 1812  Lamotrigine level  Add-on,   AD        01/30/21 1811   01/30/21 1116  Rapid urine drug screen (hospital performed)  ONCE - STAT,   STAT        01/30/21 1116   01/30/21 1116  Urinalysis, Routine w reflex microscopic  Once,   STAT        01/30/21 1116              DVT prophylaxis: enoxaparin (LOVENOX) injection 40 mg Start: 01/30/21 2200   Code Status: Full Family Communication: Sister Disposition:   Status is: Inpatient   Dispo: The patient is from: Home              Anticipated d/c is to: Home              Anticipated d/c date is: TBD, monitor WBC , follow-up on culture result ,wean O2 ,need psychiatry clearance                Consultants:  Psychiatry  Procedures:  bipap  Antimicrobials:   Anti-infectives (From admission, onward)    Start     Dose/Rate Route Frequency Ordered Stop   01/31/21 1215  doxycycline (VIBRA-TABS) tablet 100 mg        100 mg Oral Every 12 hours 01/31/21 1121             Objective: Vitals:   01/30/21 2332 01/31/21 0204 01/31/21 0319 01/31/21 0805  BP:  118/75    Pulse: 73 95 92   Resp: 15 15 11    Temp:  98.4 F (36.9 C)    TempSrc:  Oral    SpO2: 94% (!) 84%  98%    Intake/Output Summary (Last 24  hours) at 01/31/2021 0846 Last data filed at 01/31/2021 0500 Gross per 24 hour  Intake 500 ml  Output 450 ml  Net 50 ml   There were no vitals filed for this visit.  Examination:  General exam: alert, awake, communicative, + psychomotor agitation,  Respiratory system:  diffuse bilateral wheezing, Respiratory effort normal. Cardiovascular system:  RRR.  Gastrointestinal system: Abdomen is nondistended, soft and nontender.  Normal bowel sounds heard. Central nervous system: Alert and oriented. No focal neurological deficits. Extremities:  no edema Skin: No rashes, lesions or ulcers Psychiatry: aaox3, psychomotor agitation, tangential speech pattern     Data Reviewed: I have personally reviewed following labs and imaging studies  CBC: Recent Labs  Lab 01/30/21 1140  01/31/21 0429  WBC 26.1* 19.7*  NEUTROABS 23.6*  --   HGB 13.9 13.3  HCT 43.0 40.6  MCV 91.9 91.4  PLT 268 644    Basic Metabolic Panel: Recent Labs  Lab 01/30/21 1140 01/31/21 0429  NA 137 138  K 4.2 4.5  CL 100 102  CO2 25 24  GLUCOSE 176* 91  BUN 12 12  CREATININE 1.14* 0.74  CALCIUM 9.0 9.0    GFR: CrCl cannot be calculated (Unknown ideal weight.).  Liver Function Tests: Recent Labs  Lab 01/30/21 1140 01/31/21 0429  AST 35 24  ALT 20 18  ALKPHOS 61 56  BILITOT 0.8 0.9  PROT 8.4* 7.9  ALBUMIN 5.1* 4.7    CBG: Recent Labs  Lab 01/30/21 1122 01/30/21 1818 01/30/21 2224 01/31/21 0733  GLUCAP 207* 91 105* 105*     Recent Results (from the past 240 hour(s))  Resp Panel by RT-PCR (Flu A&B, Covid) Nasopharyngeal Swab     Status: None   Collection Time: 01/30/21 12:12 PM   Specimen: Nasopharyngeal Swab; Nasopharyngeal(NP) swabs in vial transport medium  Result Value Ref Range Status   SARS Coronavirus 2 by RT PCR NEGATIVE NEGATIVE Final    Comment: (NOTE) SARS-CoV-2 target nucleic acids are NOT DETECTED.  The SARS-CoV-2 RNA is generally detectable in upper  respiratory specimens during the acute phase of infection. The lowest concentration of SARS-CoV-2 viral copies this assay can detect is 138 copies/mL. A negative result does not preclude SARS-Cov-2 infection and should not be used as the sole basis for treatment or other patient management decisions. A negative result may occur with  improper specimen collection/handling, submission of specimen other than nasopharyngeal swab, presence of viral mutation(s) within the areas targeted by this assay, and inadequate number of viral copies(<138 copies/mL). A negative result must be combined with clinical observations, patient history, and epidemiological information. The expected result is Negative.  Fact Sheet for Patients:  EntrepreneurPulse.com.au  Fact Sheet for Healthcare Providers:  IncredibleEmployment.be  This test is no t yet approved or cleared by the [place] FDA and  has been authorized for detection and/or diagnosis of SARS-CoV-2 by FDA under an Emergency Use Authorization (EUA). This EUA will remain  in effect (meaning this test can be used) for the duration of the COVID-19 declaration under Section 564(b)(1) of the Act, 21 U.S.C.section 360bbb-3(b)(1), unless the authorization is terminated  or revoked sooner.       Influenza A by PCR NEGATIVE NEGATIVE Final   Influenza B by PCR NEGATIVE NEGATIVE Final    Comment: (NOTE) The Xpert Xpress SARS-CoV-2/FLU/RSV plus assay is intended as an aid in the diagnosis of influenza from Nasopharyngeal swab specimens and should not be used as a sole basis for treatment. Nasal washings and aspirates are unacceptable for Xpert Xpress SARS-CoV-2/FLU/RSV testing.  Fact Sheet for Patients: EntrepreneurPulse.com.au  Fact Sheet for Healthcare Providers: IncredibleEmployment.be  This test is not yet approved or cleared by the [place] FDA and has been  authorized for detection and/or diagnosis of SARS-CoV-2 by FDA under an Emergency Use Authorization (EUA). This EUA will remain in effect (meaning this test can be used) for the duration of the COVID-19 declaration under Section 564(b)(1) of the Act, 21 U.S.C. section 360bbb-3(b)(1), unless the authorization is terminated or revoked.  Performed at [facility], [place] [address] [place], [place] [address]          Radiology Studies: CT Head Wo Contrast  Result Date: 01/30/2021 CLINICAL DATA:  Altered mental status. EXAM: CT HEAD WITHOUT CONTRAST TECHNIQUE: Contiguous axial images were obtained from the base of the skull through the vertex without intravenous contrast. COMPARISON:  June 26, 2017. FINDINGS: Brain: Right frontal ventriculostomy catheter is noted with tip in right lateral ventricle. No mass effect or midline shift is noted. Ventricular size is within normal limits. There is no evidence of mass lesion, hemorrhage or acute infarction. Vascular: No hyperdense vessel or unexpected calcification. Skull: Right frontal ventriculostomy is noted. No definite fracture is noted. Sinuses/Orbits: No acute finding. Other: None. IMPRESSION: No acute intracranial abnormality seen. Electronically Signed   By: Marijo Conception M.D.   On: 01/30/2021 14:07   DG Chest Port 1 View  Result Date: 01/30/2021 CLINICAL DATA:  Patient found down, unresponsive with pinpoint pupils. Hypothermia. EXAM: PORTABLE CHEST 1 VIEW COMPARISON:  No prior chest radiographs available. Abdominal CT 01/03/2019. FINDINGS: 1409 hours. The heart size is at the upper limits of normal for portable semi erect technique. Ventricular peritoneal shunt overlies the right chest. The lungs appear clear. There is no pleural effusion or pneumothorax. There is a defect in the left 5th rib posteriorly which may be postsurgical. No acute osseous findings are evident. Telemetry leads overlie the chest. IMPRESSION: No  evidence of active cardiopulmonary process. Left 5th rib deformity, likely postsurgical. Electronically Signed   By: Richardean Sale M.D.   On: 01/30/2021 14:14        Scheduled Meds:  busPIRone  7.5 mg Oral BID   enoxaparin (LOVENOX) injection  40 mg Subcutaneous Q24H   insulin aspart  0-5 Units Subcutaneous QHS   insulin aspart  0-9 Units Subcutaneous TID WC   levothyroxine  137 mcg Oral q morning   Continuous Infusions:  sodium chloride       LOS: 1 day   Time spent: 6mins Greater than 50% of this time was spent in counseling, explanation of diagnosis, planning of further management, and coordination of care.   Voice Recognition Viviann Spare dictation system was used to create this note, attempts have been made to correct errors. Please contact the author with questions and/or clarifications.   Florencia Reasons, MD PhD FACP [facility]  Available via Epic secure chat 7am-7pm for nonurgent issues Please page for urgent issues To page the attending provider between 7A-7P or the covering provider during after hours 7P-7A, please log into the web site [email or web address] and access using universal [place] password for that web site. If you do not have the password, please call the hospital operator.    01/31/2021, 8:46 AM

## 2021-01-31 NOTE — Progress Notes (Signed)
Patient is very agitated, verbalizing worries about carbon monoxide poisoning in her home, stating that she is having an allergic reaction to her gown and meds. Her behavior is very erratic and states that the breathing treatments and other meds "are going to kill" her.  She says she is allergic to the albuterol and seroquel that we were going to give her.  She is pulling off her gown stating she is allergic to it because it wasn't washed in "free and clear".  RN helped her change into a disposable gown. She also is wanting her IV out.  Tried to reassure patient.  Notified MD. Andre Lefort

## 2021-01-31 NOTE — Progress Notes (Signed)
Rt took pt off BIPAP and placed on RA. Pt is awake and alert at this time.

## 2021-01-31 NOTE — Evaluation (Signed)
Clinical/Bedside Swallow Evaluation Patient Details  Name: April Meyer MRN: [id number] Date of Birth: 08/09/1965  Today's Date: 01/31/2021 Time: SLP Start Time (ACUTE ONLY): 1219 SLP Stop Time (ACUTE ONLY): 9983 SLP Time Calculation (min) (ACUTE ONLY): 22 min  Past Medical History:  Past Medical History:  Diagnosis Date   ADHD (attention deficit hyperactivity disorder)    no meds for   Anxiety    Arnold-Chiari deformity (HCC)    Asthma    AVM (arteriovenous malformation) spine 04/21/2018   Bruises easily    Cerebral venous sinus thrombosis, remote, resolved 03/28/2013   Chronic pain    Common migraine with intractable migraine 3/82/5053   Complication of anesthesia age 43    breast lumpectomy heart rate went way down in hospital stayed 2 days, no problems since   Complication of anesthesia    C 1 to C 3 fusion side to side and up and down limited    EDS (Ehlers-Danlos syndrome)    Eustachian tube dysfunction    Fatigue    GERD (gastroesophageal reflux disease)    Glioma (HCC)    low graded tectal plate glioma   Hemorrhoids    History of sleep apnea    no cpap used last 2 years, normal sleep study 2 yrs ago   HSV infection    Hypothyroid    Hypothyroidism    IIH (idiopathic intracranial hypertension)    Lumbar disc herniation    Migraine    Neck stiffness    Nystagmus, positional, central type 03/28/2013   Other malaise and fatigue 03/28/2013   " The patient reports feeling happy, ED, sore" on she has undergone at Chiari malformation surgery decompression in 2011. Prior to the surgery were chief complaints were weakness numbness and neck problems. She had swallowing difficulties and dizziness;  Classic Chiari presentation. But she didn't have headaches.   Photophobia    Pre-diabetes    Tinnitus of both ears    Urinary incontinence    Vision disturbance    Vitamin D deficiency    Weakness    Wears glasses    Past Surgical History:  Past Surgical History:  Procedure  Laterality Date   ARNOLD CHIARI REPAIR  2010   Brain shunt  12/30/2019   vp shunt   BRAIN SURGERY     BREAST BIOPSY Left    lumpectomy age 53    BREAST EXCISIONAL BIOPSY Right yrs ago   CHOLECYSTECTOMY  age 5   laparoscopic   colonscopy  2012, 2018   DILATION AND CURETTAGE OF UTERUS  last done yrs ago   x 2 or 3   ENDOMETRIAL ABLATION  02/09/2013   [facility]   HYSTEROSCOPY WITH D & C N/A 06/26/2020   Procedure: DILATATION AND CURETTAGE /HYSTEROSCOPY;  Surgeon: Salvadore Dom, MD;  Location: [facility];  Service: Gynecology;  Laterality: N/A;   INTRAUTERINE DEVICE INSERTION  10/2010   Mirena   IUD REMOVAL  11/2010   could not tolerate hormonal side effects   metal plate removed from head  2015   screw came loose   neck fusion c1-c3  2010   OPERATIVE ULTRASOUND N/A 06/26/2020   Procedure: OPERATIVE ULTRASOUND;  Surgeon: Salvadore Dom, MD;  Location: [facility];  Service: Gynecology;  Laterality: N/A;   TONSILLECTOMY AND ADENOIDECTOMY  age 19   UPPER GI ENDOSCOPY  2012, 2018   HPI:  April Meyer is a 55 y.o. female with medical history significant of Ehlers-Danlos  syndrome, spinal cord AVM, idiopathic intracranial hypertension s/p VP shunt 2020, Arnold-Chari D formation s/p decompression, migraine headache, chronic pain, hypothyroidism, type 2 diabetes mellitus, GERD who presented 11/23 after being found down with narcotic overdose. Reported difficulty swallowing to MD.    Assessment / Plan / Recommendation  Clinical Impression  Pt demonstrates a suspected esophageal dyspahgia presently exacerbated by irriation due to frequent coughing, retching. She states she has been coughing with  and without food and coughing up phlem and sometimes food." From subjective view her oropharyngeal function appears intact while taking pills with RN, thin with straw, puree and solid textures. Frequently talks immediately after swallow. Coughing present but does  not appear related to swallow (prior or during). Esophageal component may be present that will likely calm once her respiration/cough is stable. Educated her to remain upright after meals, no verbalizing after swallow. Continue regular/thin. No further ST needed. SLP Visit Diagnosis: Dysphagia, unspecified (R13.10)    Aspiration Risk  Mild aspiration risk    Diet Recommendation Regular;Thin liquid   Liquid Administration via: Cup;Straw Medication Administration: Whole meds with liquid Supervision: Patient able to self feed Compensations:  (no talking after swallow) Postural Changes: Seated upright at 90 degrees;Remain upright for at least 30 minutes after po intake    Other  Recommendations Oral Care Recommendations: Oral care BID    Recommendations for follow up therapy are one component of a multi-disciplinary discharge planning process, led by the attending physician.  Recommendations may be updated based on patient status, additional functional criteria and insurance authorization.  Follow up Recommendations No SLP follow up      Assistance Recommended at Discharge    Functional Status Assessment    Frequency and Duration            Prognosis        Swallow Study   General Date of Onset: 01/30/21 HPI: April Meyer is a 55 y.o. female with medical history significant of Ehlers-Danlos syndrome, spinal cord AVM, idiopathic intracranial hypertension s/p VP shunt 2020, Arnold-Chari D formation s/p decompression, migraine headache, chronic pain, hypothyroidism, type 2 diabetes mellitus, GERD who presented 11/23 after being found down with narcotic overdose. Reported difficulty swallowing to MD. Type of Study: Bedside Swallow Evaluation Previous Swallow Assessment: none Diet Prior to this Study: Regular;Thin liquids Temperature Spikes Noted: No Respiratory Status: Nasal cannula History of Recent Intubation: No Behavior/Cognition: Alert;Cooperative Oral Cavity Assessment: Within  Functional Limits Oral Care Completed by SLP: No Oral Cavity - Dentition: Adequate natural dentition Vision: Functional for self-feeding Self-Feeding Abilities: Able to feed self Patient Positioning: Upright in bed Baseline Vocal Quality: Normal Volitional Cough: Strong Volitional Swallow: Able to elicit    Oral/Motor/Sensory Function Overall Oral Motor/Sensory Function: Within functional limits   Ice Chips Ice chips: Not tested   Thin Liquid Thin Liquid: Within functional limits Presentation: Cup;Straw    Nectar Thick Nectar Thick Liquid: Not tested   Honey Thick Honey Thick Liquid: Not tested   Puree Puree: Within functional limits   Solid     Solid: Within functional limits      Houston Siren 01/31/2021,1:02 PM

## 2021-01-31 NOTE — Consult Note (Signed)
[facility] Consult   Reason for Consult:  Narcotic Overdose Referring Physician:  Dr. Annamaria Boots Patient Identification: April Meyer MRN:  [id number] Principal Diagnosis: Narcotic overdose [facility]) Diagnosis:  Principal Problem:   Narcotic overdose ([place]) Active Problems:   History of Chiari malformation   Ehlers-Danlos syndrome, unspecified   Hypothyroid   S/P VP shunt   DM2 (diabetes mellitus, type 2) ([place])   GERD (gastroesophageal reflux disease)   Acute respiratory failure with hypoxia and hypercapnia ([place])   Total Time spent with patient: 45 minutes  Subjective:   Breella B Meyer is a 55 y.o. female patient admitted after being found down by her neighbors yesterday morning.  Patient states she unintentionally took more medication than supposed."  I think I had taken the medication, and threw it back up.  So I went back inside the house and took another pill, not realizing I had delivered food versus the pill.  I have not now, or ever tried to hurt myself or end my life.  This was not a suicide attempt.  Patient states she has a diagnosis of depression and anxiety, currently being managed with BuSpar and Lamictal.  Her current outpatient provider is Dr. Horald Pollen who has been managing her care for quite some time.  Patient admits to recent stressor to include the loss of her son while in her home.  However she has sought out appropriate outpatient resources to include receiving grief counseling services, attending hospice support groups, and recently joined employee assistance program at the [employer] in which she sees once a week.  She denies any previous inpatient psychiatric admission.  She denies any history of suicide attempt, suicidal thoughts, suicidal ideation, and or nonsuicidal self-injurious behavior.  See family history below.  She does endorse illicit use of THC edibles and CBD.  She states majority of her cannabidiol use is ingested.  However she does report a  recent trip to [place] in which she was able to smoke marijuana.  She denies any alcohol use disorder, and or recent alcohol-related binges.  She contributes her lack of interest in alcohol related to family history and previous bad experiences.  Patient does not appear to be imminent threat and or immediate danger to herself.  She has actively sought out outpatient psychiatric resources, that are appropriate for her current stressors.  She has a great rapport with her current outpatient provider, and attends follow-up appointments as necessary.  Patient is appropriate to continue to follow with outpatient providers at this time.   Patient is seen and assessed by this nurse practitioner.  Case is discussed with Dr. Lovette Cliche, in addition to treatment team([facility]).  On today's evaluation patient describes her mood as not good as she is observed struggling to breathe, with notable audible wheezing and rhonchi.  She endorses a not good mood as related to" swallowing food particles in my lungs."  She states she recently lost her son 2 months ago in her home, however reports managing his death well  "I know where he lives now, and he is in a good place.  "  She denies any current depressive symptoms, anxiety, paranoia, psychosis, hallucinations, delusions. She further reports not having any anger, agitation, anger or irritability during this hospitalization.  She endorses a good sleep, as well as good appetite.  She denies any current side effects, and or adverse reactions , and has been compliant with her medication Lamictal and BuSpar.   At this time she denies suicidal  ideations, homicidal ideations, and or auditory or visual hallucinations.  She is able to contract for safety while on the unit.  Will psychiatrically clear patient at this time.  Brief discussion with her Sister [name], who is in agreement with plan for outpatient follow-up.  Resources will be made available for her after visit summary.   HPI:  April Meyer is a 55 y.o. female with medical history significant of Ehlers-Danlos syndrome, spinal cord AVM, idiopathic intracranial hypertension s/p VP shunt 2020, Arnold-Chari D formation s/p decompression, migraine headache, chronic pain, hypothyroidism, type 2 diabetes mellitus, GERD who presented to [place] ED on 11/23 after being found down outside at home by neighbors.  EMS was activated, patient was noted to be hypothermic with pinpoint pupils and unresponsive and not breathing well in by EMS and was given intranasal Narcan and placed on a nonrebreather and transported to the ED for further evaluation.  Patient's sister is present at bedside who contributes to the HPI.  Sister lives approximately 3 hours away but talks with her sister about 7-8 times per day via phone call or text message.  Talk to her this morning around 7:00 with no significant concerns.  Patient states does not recall the events this morning and states she has not suicidal.  Denies taking her oxycodone and attempt to hurt herself.   Patient currently on oxycodone 10 mg tablets, filled on 01/03/2021 with 150 tablets, currently has 86.5 pills remaining.  Past Psychiatric History: Depression  Risk to Self:  Denies Risk to Others:   Denies Prior Inpatient Therapy:   Denies Prior Outpatient Therapy:   Denies  Past Medical History:  Past Medical History:  Diagnosis Date   ADHD (attention deficit hyperactivity disorder)    no meds for   Anxiety    Arnold-Chiari deformity (HCC)    Asthma    AVM (arteriovenous malformation) spine 04/21/2018   Bruises easily    Cerebral venous sinus thrombosis, remote, resolved 03/28/2013   Chronic pain    Common migraine with intractable migraine 6/76/1950   Complication of anesthesia age 51    breast lumpectomy heart rate went way down in hospital stayed 2 days, no problems since   Complication of anesthesia    C 1 to C 3 fusion side to side and up and down limited    EDS (Ehlers-Danlos  syndrome)    Eustachian tube dysfunction    Fatigue    GERD (gastroesophageal reflux disease)    Glioma (HCC)    low graded tectal plate glioma   Hemorrhoids    History of sleep apnea    no cpap used last 2 years, normal sleep study 2 yrs ago   HSV infection    Hypothyroid    Hypothyroidism    IIH (idiopathic intracranial hypertension)    Lumbar disc herniation    Migraine    Neck stiffness    Nystagmus, positional, central type 03/28/2013   Other malaise and fatigue 03/28/2013   " The patient reports feeling happy, ED, sore" on she has undergone at Chiari malformation surgery decompression in 2011. Prior to the surgery were chief complaints were weakness numbness and neck problems. She had swallowing difficulties and dizziness;  Classic Chiari presentation. But she didn't have headaches.   Photophobia    Pre-diabetes    Tinnitus of both ears    Urinary incontinence    Vision disturbance    Vitamin D deficiency    Weakness    Wears glasses  Past Surgical History:  Procedure Laterality Date   ARNOLD CHIARI REPAIR  2010   Brain shunt  12/30/2019   vp shunt   BRAIN SURGERY     BREAST BIOPSY Left    lumpectomy age 54    BREAST EXCISIONAL BIOPSY Right yrs ago   CHOLECYSTECTOMY  age 71   laparoscopic   colonscopy  2012, 2018   DILATION AND CURETTAGE OF UTERUS  last done yrs ago   x 2 or 3   ENDOMETRIAL ABLATION  02/09/2013   [facility]   HYSTEROSCOPY WITH D & C N/A 06/26/2020   Procedure: DILATATION AND CURETTAGE /HYSTEROSCOPY;  Surgeon: Salvadore Dom, MD;  Location: [facility];  Service: Gynecology;  Laterality: N/A;   INTRAUTERINE DEVICE INSERTION  10/2010   Mirena   IUD REMOVAL  11/2010   could not tolerate hormonal side effects   metal plate removed from head  2015   screw came loose   neck fusion c1-c3  2010   OPERATIVE ULTRASOUND N/A 06/26/2020   Procedure: OPERATIVE ULTRASOUND;  Surgeon: Salvadore Dom, MD;  Location: [facility];  Service: Gynecology;  Laterality: N/A;   TONSILLECTOMY AND ADENOIDECTOMY  age 73   UPPER GI ENDOSCOPY  2012, 2018   Family History:  Family History  Problem Relation Age of Onset   Aortic dissection Father    Diabetes Maternal Aunt    Breast cancer Maternal Aunt        40's   Diabetes Maternal Uncle    Breast cancer Paternal Aunt 5   Diabetes Maternal Grandmother    Cancer Maternal Grandmother        SKIN AND LUNG   Diabetes Paternal Grandmother    Breast cancer Maternal Aunt        50's   Breast cancer Maternal Aunt        50's   Family Psychiatric  History: As per patient strong family history of severe alcohol use disorder.  She reports a brother has alcohol use disorder, sister, paternal grandfather, and maternal grandfather diagnosed with alcohol use disorder. Social History:  Social History   Substance and Sexual Activity  Alcohol Use No   Comment: h/o binge drinking none in 10 years     Social History   Substance and Sexual Activity  Drug Use Yes   Types: Marijuana   Comment: daily  use of marijuana recently    Social History   Socioeconomic History   Marital status: Divorced    Spouse name: Not on file   Number of children: 1   Years of education: 16   Highest education level: Not on file  Occupational History    Employer: OTHER  Tobacco Use   Smoking status: Former    Packs/day: 2.00    Years: 21.00    Pack years: 42.00    Types: Cigarettes    Quit date: 09/09/2007    Years since quitting: 13.4   Smokeless tobacco: Never  Vaping Use   Vaping Use: Never used  Substance and Sexual Activity   Alcohol use: No    Comment: h/o binge drinking none in 10 years   Drug use: Yes    Types: Marijuana    Comment: daily  use of marijuana recently   Sexual activity: Yes    Partners: Male    Birth control/protection: Other-see comments    Comment: Vasectomy-1st intercourse 55 yo-More than 5 partners  Other Topics Concern   Not on file  Social  History Narrative   Patient is divorced and lives at home with her one child.   Patient is disabled.   Patient is right-handed.   Patient has a college education.   Patient drinks one cup of coffee daily.   Social Determinants of Health   Financial Resource Strain: Not on file  Food Insecurity: Not on file  Transportation Needs: Not on file  Physical Activity: Not on file  Stress: Not on file  Social Connections: Not on file   Additional Social History:    Allergies:   Allergies  Allergen Reactions   Other Anaphylaxis    Steroids and Zucchini    Prednisone Anaphylaxis   Morphine And Related Itching    Severe    Labs:  Results for orders placed or performed during the hospital encounter of 01/30/21 (from the past 48 hour(s))  CBG monitoring, ED     Status: Abnormal   Collection Time: 01/30/21 11:22 AM  Result Value Ref Range   Glucose-Capillary 207 (H) 70 - 99 mg/dL    Comment: Glucose reference range applies only to samples taken after fasting for at least 8 hours.  CBC with Differential     Status: Abnormal   Collection Time: 01/30/21 11:40 AM  Result Value Ref Range   WBC 26.1 (H) 4.0 - 10.5 K/uL   RBC 4.68 3.87 - 5.11 MIL/uL   Hemoglobin 13.9 12.0 - 15.0 g/dL   HCT 43.0 36.0 - 46.0 %   MCV 91.9 80.0 - 100.0 fL   MCH 29.7 26.0 - 34.0 pg   MCHC 32.3 30.0 - 36.0 g/dL   RDW 13.0 11.5 - 15.5 %   Platelets 268 150 - 400 K/uL   nRBC 0.0 0.0 - 0.2 %   Neutrophils Relative % 92 %   Neutro Abs 23.6 (H) 1.7 - 7.7 K/uL   Lymphocytes Relative 4 %   Lymphs Abs 1.1 0.7 - 4.0 K/uL   Monocytes Relative 3 %   Monocytes Absolute 0.8 0.1 - 1.0 K/uL   Eosinophils Relative 0 %   Eosinophils Absolute 0.1 0.0 - 0.5 K/uL   Basophils Relative 0 %   Basophils Absolute 0.1 0.0 - 0.1 K/uL   WBC Morphology VACUOLATED NEUTROPHILS    Immature Granulocytes 1 %   Abs Immature Granulocytes 0.37 (H) 0.00 - 0.07 K/uL    Comment: Performed at [facility], [place]  [address] [place], [place] [address]  Comprehensive metabolic panel     Status: Abnormal   Collection Time: 01/30/21 11:40 AM  Result Value Ref Range   Sodium 137 135 - 145 mmol/L   Potassium 4.2 3.5 - 5.1 mmol/L   Chloride 100 98 - 111 mmol/L   CO2 25 22 - 32 mmol/L   Glucose, Bld 176 (H) 70 - 99 mg/dL    Comment: Glucose reference range applies only to samples taken after fasting for at least 8 hours.   BUN 12 6 - 20 mg/dL   Creatinine, Ser 1.14 (H) 0.44 - 1.00 mg/dL   Calcium 9.0 8.9 - 10.3 mg/dL   Total Protein 8.4 (H) 6.5 - 8.1 g/dL   Albumin 5.1 (H) 3.5 - 5.0 g/dL   AST 35 15 - 41 U/L   ALT 20 0 - 44 U/L   Alkaline Phosphatase 61 38 - 126 U/L   Total Bilirubin 0.8 0.3 - 1.2 mg/dL   GFR, Estimated 57 (L) >60 mL/min    Comment: (NOTE) Calculated using the CKD-EPI Creatinine  Equation (2021)    Anion gap 12 5 - 15    Comment: Performed at [facility], [place] [address], [place], [place] [address]  Blood gas, venous (at [facility] and [facility], not at [facility])     Status: Abnormal   Collection Time: 01/30/21 11:40 AM  Result Value Ref Range   FIO2 21.00    pH, Ven 7.073 (LL) 7.250 - 7.430    Comment: REPEATED TO VERIFY CRITICAL RESULT CALLED TO, READ BACK BY AND VERIFIED WITH: SHAW,S. RN AT 1158 01/30/21 MULLINS,T    pCO2, Ven 85.7 (HH) 44.0 - 60.0 mmHg    Comment: REPEATED TO VERIFY CRITICAL RESULT CALLED TO, READ BACK BY AND VERIFIED WITH: SHAW,S. RN AT 1158 01/30/21 MULLINS,T    pO2, Ven 31.8 (LL) 32.0 - 45.0 mmHg    Comment: REPEATED TO VERIFY CRITICAL RESULT CALLED TO, READ BACK BY AND VERIFIED WITH: SHAW,S. RN AT 1158 01/30/21 MULLINS,T    Bicarbonate 23.9 20.0 - 28.0 mmol/L   Acid-base deficit 8.7 (H) 0.0 - 2.0 mmol/L   O2 Saturation 43.4 %   Patient temperature 98.6     Comment: Performed at [facility], [place] [address], [place], [place] [address]  Ethanol     Status: None   Collection Time: 01/30/21 11:40 AM  Result Value Ref Range    Alcohol, Ethyl (B) <10 <10 mg/dL    Comment: (NOTE) Lowest detectable limit for serum alcohol is 10 mg/dL.  For medical purposes only. Performed at [facility], [place] [address], [place], [place] [address]   TSH     Status: Abnormal   Collection Time: 01/30/21 11:40 AM  Result Value Ref Range   TSH 16.268 (H) 0.350 - 4.500 uIU/mL    Comment: Performed by a 3rd Generation assay with a functional sensitivity of <=0.01 uIU/mL. Performed at [facility], [place] [address] [place], [place] [address]   I-Stat Beta hCG blood, ED ([facility], [facility], [facility] only)     Status: None   Collection Time: 01/30/21 11:48 AM  Result Value Ref Range   I-stat hCG, quantitative <5.0 <5 mIU/mL   Comment 3            Comment:   GEST. AGE      CONC.  (mIU/mL)   <=1 WEEK        5 - 50     2 WEEKS       50 - 500     3 WEEKS       100 - 10,000     4 WEEKS     1,000 - 30,000        FEMALE AND NON-PREGNANT FEMALE:     LESS THAN 5 mIU/mL   Resp Panel by RT-PCR (Flu A&B, Covid) Nasopharyngeal Swab     Status: None   Collection Time: 01/30/21 12:12 PM   Specimen: Nasopharyngeal Swab; Nasopharyngeal(NP) swabs in vial transport medium  Result Value Ref Range   SARS Coronavirus 2 by RT PCR NEGATIVE NEGATIVE    Comment: (NOTE) SARS-CoV-2 target nucleic acids are NOT DETECTED.  The SARS-CoV-2 RNA is generally detectable in upper respiratory specimens during the acute phase of infection. The lowest concentration of SARS-CoV-2 viral copies this assay can detect is 138 copies/mL. A negative result does not preclude SARS-Cov-2 infection and should not be used as the sole basis for treatment or other patient management decisions. A negative result may occur with  improper specimen collection/handling, submission  of specimen other than nasopharyngeal swab, presence of viral mutation(s) within the areas targeted by this assay, and inadequate number of viral copies(<138 copies/mL). A negative  result must be combined with clinical observations, patient history, and epidemiological information. The expected result is Negative.  Fact Sheet for Patients:  EntrepreneurPulse.com.au  Fact Sheet for Healthcare Providers:  IncredibleEmployment.be  This test is no t yet approved or cleared by the [place] FDA and  has been authorized for detection and/or diagnosis of SARS-CoV-2 by FDA under an Emergency Use Authorization (EUA). This EUA will remain  in effect (meaning this test can be used) for the duration of the COVID-19 declaration under Section 564(b)(1) of the Act, 21 U.S.C.section 360bbb-3(b)(1), unless the authorization is terminated  or revoked sooner.       Influenza A by PCR NEGATIVE NEGATIVE   Influenza B by PCR NEGATIVE NEGATIVE    Comment: (NOTE) The Xpert Xpress SARS-CoV-2/FLU/RSV plus assay is intended as an aid in the diagnosis of influenza from Nasopharyngeal swab specimens and should not be used as a sole basis for treatment. Nasal washings and aspirates are unacceptable for Xpert Xpress SARS-CoV-2/FLU/RSV testing.  Fact Sheet for Patients: EntrepreneurPulse.com.au  Fact Sheet for Healthcare Providers: IncredibleEmployment.be  This test is not yet approved or cleared by the [place] FDA and has been authorized for detection and/or diagnosis of SARS-CoV-2 by FDA under an Emergency Use Authorization (EUA). This EUA will remain in effect (meaning this test can be used) for the duration of the COVID-19 declaration under Section 564(b)(1) of the Act, 21 U.S.C. section 360bbb-3(b)(1), unless the authorization is terminated or revoked.  Performed at [facility], [place] [address], [place], [place] [address]   Blood gas, venous (at [facility] and [facility], not at [facility])     Status: Abnormal   Collection Time: 01/30/21  2:16 PM  Result Value Ref Range   pH, Ven 7.168 (LL)  7.250 - 7.430    Comment: CRITICAL RESULT CALLED TO, READ BACK BY AND VERIFIED WITH:   pCO2, Ven 75.3 (HH) 44.0 - 60.0 mmHg   pO2, Ven 67.9 (H) 32.0 - 45.0 mmHg   Bicarbonate 26.3 20.0 - 28.0 mmol/L   Acid-base deficit 3.7 (H) 0.0 - 2.0 mmol/L   O2 Saturation 90.3 %   Patient temperature 98.6     Comment: Performed at [facility], [place] [address], [place], [place] [address]  Glucose, capillary     Status: None   Collection Time: 01/30/21  6:18 PM  Result Value Ref Range   Glucose-Capillary 91 70 - 99 mg/dL    Comment: Glucose reference range applies only to samples taken after fasting for at least 8 hours.  HIV Antibody (routine testing w rflx)     Status: None   Collection Time: 01/30/21  6:41 PM  Result Value Ref Range   HIV Screen 4th Generation wRfx Non Reactive Non Reactive    Comment: Performed at [facility], [place] [address], [place], [place] [address]  Hemoglobin A1c     Status: None   Collection Time: 01/30/21  6:41 PM  Result Value Ref Range   Hgb A1c MFr Bld 5.1 4.8 - 5.6 %    Comment: (NOTE) Pre diabetes:          5.7%-6.4%  Diabetes:              >6.4%  Glycemic control for   <7.0% adults with diabetes    Mean Plasma Glucose  99.67 mg/dL    Comment: Performed at [facility], [place] [address], [place], [place] [address]  T4, free     Status: Abnormal   Collection Time: 01/30/21  6:41 PM  Result Value Ref Range   Free T4 1.13 (H) 0.61 - 1.12 ng/dL    Comment: (NOTE) Biotin ingestion may interfere with free T4 tests. If the results are inconsistent with the TSH level, previous test results, or the clinical presentation, then consider biotin interference. If needed, order repeat testing after stopping biotin. Performed at [facility], [place] [address] [place], [place] [address]   Glucose, capillary     Status: Abnormal   Collection Time: 01/30/21 10:24 PM  Result Value Ref Range   Glucose-Capillary 105 (H) 70 - 99 mg/dL     Comment: Glucose reference range applies only to samples taken after fasting for at least 8 hours.  Comprehensive metabolic panel     Status: None   Collection Time: 01/31/21  4:29 AM  Result Value Ref Range   Sodium 138 135 - 145 mmol/L   Potassium 4.5 3.5 - 5.1 mmol/L   Chloride 102 98 - 111 mmol/L   CO2 24 22 - 32 mmol/L   Glucose, Bld 91 70 - 99 mg/dL    Comment: Glucose reference range applies only to samples taken after fasting for at least 8 hours.   BUN 12 6 - 20 mg/dL   Creatinine, Ser 0.74 0.44 - 1.00 mg/dL   Calcium 9.0 8.9 - 10.3 mg/dL   Total Protein 7.9 6.5 - 8.1 g/dL   Albumin 4.7 3.5 - 5.0 g/dL   AST 24 15 - 41 U/L   ALT 18 0 - 44 U/L   Alkaline Phosphatase 56 38 - 126 U/L   Total Bilirubin 0.9 0.3 - 1.2 mg/dL   GFR, Estimated >60 >60 mL/min    Comment: (NOTE) Calculated using the CKD-EPI Creatinine Equation (2021)    Anion gap 12 5 - 15    Comment: Performed at [facility], [place] [address] [place], [place] [address]  CBC     Status: Abnormal   Collection Time: 01/31/21  4:29 AM  Result Value Ref Range   WBC 19.7 (H) 4.0 - 10.5 K/uL   RBC 4.44 3.87 - 5.11 MIL/uL   Hemoglobin 13.3 12.0 - 15.0 g/dL   HCT 40.6 36.0 - 46.0 %   MCV 91.4 80.0 - 100.0 fL   MCH 30.0 26.0 - 34.0 pg   MCHC 32.8 30.0 - 36.0 g/dL   RDW 13.2 11.5 - 15.5 %   Platelets 274 150 - 400 K/uL   nRBC 0.0 0.0 - 0.2 %    Comment: Performed at [facility], [place] [address] [place], [place] [address]  Blood gas, venous     Status: Abnormal   Collection Time: 01/31/21  4:29 AM  Result Value Ref Range   pH, Ven 7.238 (L) 7.250 - 7.430   pCO2, Ven 61.4 (H) 44.0 - 60.0 mmHg   pO2, Ven 53.5 (H) 32.0 - 45.0 mmHg   Bicarbonate 25.2 20.0 - 28.0 mmol/L   Acid-base deficit 2.9 (H) 0.0 - 2.0 mmol/L   O2 Saturation 86.6 %   Patient temperature 98.6     Comment: Performed at [facility], [place] [address], [place], [place] [address]  Glucose,  capillary     Status: Abnormal   Collection Time: 01/31/21  7:33 AM  Result Value Ref  Range   Glucose-Capillary 105 (H) 70 - 99 mg/dL    Comment: Glucose reference range applies only to samples taken after fasting for at least 8 hours.  Expectorated Sputum Assessment w Gram Stain, Rflx to Resp Cult     Status: None   Collection Time: 01/31/21  8:50 AM   Specimen: Sputum  Result Value Ref Range   Specimen Description SPUTUM    Special Requests NONE    Sputum evaluation      THIS SPECIMEN IS ACCEPTABLE FOR SPUTUM CULTURE Performed at [facility], [place] [address] [place], [place] [address]    Report Status 01/31/2021 FINAL     Current Facility-Administered Medications  Medication Dose Route Frequency Provider Last Rate Last Admin   0.9 %  sodium chloride infusion   Intravenous Continuous Florencia Reasons, MD 75 mL/hr at 01/31/21 1041 New Bag at 01/31/21 1041   acetaminophen (TYLENOL) tablet 650 mg  650 mg Oral Q6H PRN [place] Eric J, DO       Or   acetaminophen (TYLENOL) suppository 650 mg  650 mg Rectal Q6H PRN [place] Eric J, DO       albuterol (PROVENTIL) (2.5 MG/3ML) 0.083% nebulizer solution 2.5 mg  2.5 mg Nebulization Q2H PRN [place] Donnamarie Poag, DO   2.5 mg at 01/31/21 3235   busPIRone (BUSPAR) tablet 7.5 mg  7.5 mg Oral BID [place] Eric J, DO   7.5 mg at 01/31/21 1033   doxycycline (VIBRA-TABS) tablet 100 mg  100 mg Oral Q12H Florencia Reasons, MD       enoxaparin (LOVENOX) injection 40 mg  40 mg Subcutaneous Q24H [place] Eric J, DO   40 mg at 01/30/21 2230   guaiFENesin (MUCINEX) 12 hr tablet 600 mg  600 mg Oral BID Florencia Reasons, MD       insulin aspart (novoLOG) injection 0-5 Units  0-5 Units Subcutaneous QHS [place] Eric J, DO       insulin aspart (novoLOG) injection 0-9 Units  0-9 Units Subcutaneous TID WC [place] Eric J, DO       ipratropium-albuterol (DUONEB) 0.5-2.5 (3) MG/3ML nebulizer solution 3 mL  3 mL Nebulization Q6H Florencia Reasons, MD       levothyroxine (SYNTHROID)  tablet 137 mcg  137 mcg Oral q morning [place] Eric J, DO   137 mcg at 01/31/21 5732   naloxone [facility]) injection 0.4 mg  0.4 mg Intravenous PRN [place] Donnamarie Poag, DO       ondansetron [facility]) tablet 4 mg  4 mg Oral Q6H PRN [place] Eric J, DO       Or   ondansetron [facility]) injection 4 mg  4 mg Intravenous Q6H PRN [place] Eric J, DO       polyethylene glycol (MIRALAX / GLYCOLAX) packet 17 g  17 g Oral Daily PRN [place] Eric J, DO        Musculoskeletal: Strength & Muscle Tone: within normal limits Gait & Station: normal Patient leans: N/A  Psychiatric Specialty Exam:  Presentation  General Appearance: Appropriate for Environment; Disheveled  Eye Contact:Good  Speech:Clear and Coherent; Normal Rate  Speech Volume:Normal  Handedness:Right   Mood and Affect  Mood:Anxious  Affect:Appropriate; Congruent   Thought Process  Thought Processes:Coherent; Linear  Descriptions of Associations:Tangential  Orientation:Full (Time, Place and Person)  Thought Content:Scattered; Logical  History of Schizophrenia/Schizoaffective disorder:No data recorded Duration of Psychotic Symptoms:No data recorded Hallucinations:Hallucinations: None  Ideas of Reference:None  Suicidal Thoughts:Suicidal Thoughts: No  Homicidal Thoughts:Homicidal Thoughts: No  Sensorium  Memory:Immediate Good; Recent Good; Remote Good  Judgment:Fair  Insight:Fair   Executive Functions  Concentration:Fair  Attention Span:Fair  [place]   Psychomotor Activity  Psychomotor Activity:Psychomotor Activity: Restlessness   Assets  Assets:Communication Skills; Financial Resources/Insurance; Housing; Leisure Time; Resilience; Social Support   Sleep  Sleep:Sleep: Fair   Physical Exam: Physical Exam Vitals and nursing note reviewed.  Constitutional:      Appearance: Normal appearance. She is normal weight.  Pulmonary:     Effort: Tachypnea  present.     Breath sounds: Transmitted upper airway sounds present.     Comments: Audible wheezing Neurological:     General: No focal deficit present.     Mental Status: She is alert and oriented to person, place, and time. Mental status is at baseline.  Psychiatric:        Mood and Affect: Mood normal.        Behavior: Behavior normal.        Thought Content: Thought content normal.        Judgment: Judgment normal.   Review of Systems  Psychiatric/Behavioral:  The patient is nervous/anxious.   All other systems reviewed and are negative. Blood pressure 118/75, pulse 92, temperature 98.4 F (36.9 C), temperature source Oral, resp. rate 11, SpO2 95 %. There is no height or weight on file to calculate BMI.  Treatment Plan Summary: Plan Will recommend patient continue her medications .  She reports she has an upcoming appointment with her outpatient provider on Tuesday of next week, and will advise a possible medication adjustment at that time. -She is encouraged to continue to seek outpatient therapy services to include hospice, grief counseling, and employee assistance.  Will offer resources for intensive outpatient programming through [facility] behavioral health outpatient in the event she feels therapy services need to be increased in order to help manage her anxiety and stressors.  Although she appears to be coping well with the loss of her son.  Disposition: No evidence of imminent risk to self or others at present.   Patient does not meet criteria for psychiatric inpatient admission. Supportive therapy provided about ongoing stressors. Refer to IOP. Discussed crisis plan, support from social network, calling 911, coming to the Emergency Department, and calling Suicide Hotline.  Suella Broad, FNP 01/31/2021 11:35 AM

## 2021-01-31 NOTE — Progress Notes (Signed)
Pt seen for for scheduled nebulizer treatment.  HR 88, rr20, spo2 93% on room air.  Pt is awake, alert, talking, no increased wob or respiratory distress noted.  Bipap in room on standby but not indicated at this time.

## 2021-01-31 NOTE — Progress Notes (Signed)
Notified Lab that ABG being sent for analysis. 

## 2021-01-31 NOTE — Progress Notes (Signed)
Low Ve noted with continuous low Vt. Patient snoring past positive pressures. BiPAP settings increased per flowsheet documentation.

## 2021-02-01 DIAGNOSIS — J9601 Acute respiratory failure with hypoxia: Secondary | ICD-10-CM | POA: Diagnosis not present

## 2021-02-01 DIAGNOSIS — Z982 Presence of cerebrospinal fluid drainage device: Secondary | ICD-10-CM | POA: Diagnosis not present

## 2021-02-01 DIAGNOSIS — Q796 Ehlers-Danlos syndrome, unspecified: Secondary | ICD-10-CM | POA: Diagnosis not present

## 2021-02-01 DIAGNOSIS — T40601A Poisoning by unspecified narcotics, accidental (unintentional), initial encounter: Secondary | ICD-10-CM | POA: Diagnosis not present

## 2021-02-01 LAB — CBC
HCT: 34.8 % — ABNORMAL LOW (ref 36.0–46.0)
Hemoglobin: 11.4 g/dL — ABNORMAL LOW (ref 12.0–15.0)
MCH: 29.5 pg (ref 26.0–34.0)
MCHC: 32.8 g/dL (ref 30.0–36.0)
MCV: 90.2 fL (ref 80.0–100.0)
Platelets: 205 10*3/uL (ref 150–400)
RBC: 3.86 MIL/uL — ABNORMAL LOW (ref 3.87–5.11)
RDW: 13 % (ref 11.5–15.5)
WBC: 17 10*3/uL — ABNORMAL HIGH (ref 4.0–10.5)
nRBC: 0 % (ref 0.0–0.2)

## 2021-02-01 LAB — BASIC METABOLIC PANEL
Anion gap: 9 (ref 5–15)
BUN: 8 mg/dL (ref 6–20)
CO2: 25 mmol/L (ref 22–32)
Calcium: 8.5 mg/dL — ABNORMAL LOW (ref 8.9–10.3)
Chloride: 101 mmol/L (ref 98–111)
Creatinine, Ser: 0.64 mg/dL (ref 0.44–1.00)
GFR, Estimated: >60 mL/min (ref >60)
Glucose, Bld: 83 mg/dL (ref 70–99)
Potassium: 3.7 mmol/L (ref 3.5–5.1)
Sodium: 135 mmol/L (ref 135–145)

## 2021-02-01 LAB — GLUCOSE, CAPILLARY
Glucose-Capillary: 85 mg/dL (ref 70–99)
Glucose-Capillary: 84 mg/dL (ref 70–99)
Glucose-Capillary: 103 mg/dL — ABNORMAL HIGH (ref 70–99)
Glucose-Capillary: 115 mg/dL — ABNORMAL HIGH (ref 70–99)

## 2021-02-01 LAB — MAGNESIUM: Magnesium: 2 mg/dL (ref 1.7–2.4)

## 2021-02-01 LAB — LAMOTRIGINE LEVEL: Lamotrigine Lvl: <1 ug/mL — ABNORMAL LOW (ref 2.0–20.0)

## 2021-02-01 LAB — PHOSPHORUS: Phosphorus: 2.5 mg/dL (ref 2.5–4.6)

## 2021-02-01 MED ORDER — CALCIUM CARBONATE ANTACID 500 MG PO CHEW
1.0000 | CHEWABLE_TABLET | Freq: Two times a day (BID) | ORAL | Status: DC
  Administered 2021-02-01 – 2021-02-02 (×2): 200 mg via ORAL
  Filled 2021-02-01 (×2): qty 1

## 2021-02-01 MED ORDER — PANTOPRAZOLE SODIUM 40 MG PO TBEC
40.0000 mg | DELAYED_RELEASE_TABLET | Freq: Every day | ORAL | Status: DC
  Administered 2021-02-01 – 2021-02-03 (×3): 40 mg via ORAL
  Filled 2021-02-01 (×3): qty 1

## 2021-02-01 MED ORDER — OXYCODONE HCL 5 MG PO TABS
10.0000 mg | ORAL_TABLET | ORAL | Status: DC | PRN
  Administered 2021-02-01 – 2021-02-05 (×10): 10 mg via ORAL
  Filled 2021-02-01 (×10): qty 2

## 2021-02-01 MED ORDER — LACTATED RINGERS IV SOLN: INTRAVENOUS | Status: AC

## 2021-02-01 NOTE — Consult Note (Signed)
[facility] Consult   Reason for Consult:  Narcotic Overdose Referring Physician:  Dr. Annamaria Boots Patient Identification: April Meyer MRN:  [id number] Principal Diagnosis: Narcotic overdose [facility]) Diagnosis:  Principal Problem:   Narcotic overdose ([place]) Active Problems:   History of Chiari malformation   Ehlers-Danlos syndrome, unspecified   Hypothyroid   S/P VP shunt   DM2 (diabetes mellitus, type 2) ([place])   GERD (gastroesophageal reflux disease)   Acute respiratory failure with hypoxia and hypercapnia ([place])   Total Time spent with patient: 45 minutes  Subjective:   April Meyer is a 55 y.o. female patient is seen and reassessed by the psychiatric nurse practitioner.  Patient is observed to be resting in bed, however is easily awakened and pleasantly engages with evaluation.  Patient is noted to have 3 family members at bedside (2 ) her sister April Meyer and brother April Meyer in her room and (1 ) outside her door who is identified as aunt April Meyer.  Patient does provide consent to proceed with psychiatric reevaluation while family is present.  Patient is alert and oriented, calm and cooperative.  Discussed with patient nursing and staff concerns regarding acute psychosis that developed yesterday afternoon.  Patient does state" I was advised of the behaviors, however I do not recall any of those things that happened.  Although my family did make me aware of things as stated yesterday. "  Patient is alert and oriented x5, shows no memory impairment, and or difficulty with recall.  She is able to identify name, date of birth, location, reason for presenting to the emergency room, president, and previous presidents.  She jokingly expresses her dislike for the previous president, and uses a way with words to describe her dislikes.  Patient continues to deny any recent psychiatric symptoms.  She further denies any paranoia, delusions, psychosis, mania, agitation, aggression, and or volatile behaviors.  She  denies any hallucinations.  And does not appear to be responding to internal stimuli, external stimuli, and or delusional thought disorder.  She denies any current depressive symptoms and anxiety, and is noted to be smiling and answer all questions appropriately throughout the evaluation.  Writer does speak with family at great length, in which patient is noted to doze off to sleep during questioning answers.  As Probation officer was wrapping up comments questions and concerns from her brother and sister, her aunt then entered the room and had additional questions and concerns.  Her aunt April Meyer states she is local and speaks with patient about 7-8 times a day, reports that patient began showing signs of psychosis early Friday.  As per aunt " she called on Friday and was talking about detoxing, gotta get me off of these feels they are killing me.  My body is leaving and gone beyond the clouds."  She further reports this also started after receiving medication now known to be steroid from her primary care provider, as the patient was known to be sick.  Her aunt did present with a significant amount of concerns both psychiatric and medical related, this nurse practitioner made best attempts to address all questions from a psychiatric standpoint and redirected her to speak with her medical team in regards to concerns she had.  Overall did discuss with all 3 family members that patient most likely did experience brief episodes of psychosis likely related to steroid induced psychosis and or delirium.  Further discussion was had that despite her acute presentation of psychiatric symptoms, her cognitive impairment and psychosis will  lag behind clinical lab improvement.  Her current lab values are trending downward, her cultures have not resulted, and are still pending at this time however she is receiving appropriate antibiotic treatment which appears to be responsive to her ongoing infection.  Patient continues to remain  psychiatrically cleared, and may follow up with outpatient psychiatric provider at her discretion.  Will recommend follow-up for cognitive behavioral therapy, stress management, and continue ongoing efforts for grief and loss counseling.   HPI:  April Meyer is a 55 y.o. female with medical history significant of Ehlers-Danlos syndrome, spinal cord AVM, idiopathic intracranial hypertension s/p VP shunt 2020, Arnold-Chari D formation s/p decompression, migraine headache, chronic pain, hypothyroidism, type 2 diabetes mellitus, GERD who presented to [facility] ED on 11/23 after being found down outside at home by neighbors.  EMS was activated, patient was noted to be hypothermic with pinpoint pupils and unresponsive and not breathing well in by EMS and was given intranasal Narcan and placed on a nonrebreather and transported to the ED for further evaluation.  Patient's sister is present at bedside who contributes to the HPI.  Sister lives approximately 3 hours away but talks with her sister about 7-8 times per day via phone call or text message.  Talk to her this morning around 7:00 with no significant concerns.  Patient states does not recall the events this morning and states she has not suicidal.  Denies taking her oxycodone and attempt to hurt herself.   Patient currently on oxycodone 10 mg tablets, filled on 01/03/2021 with 150 tablets, currently has 86.5 pills remaining.  Past Psychiatric History: Depression  Risk to Self:  Denies Risk to Others:   Denies Prior Inpatient Therapy:   Denies Prior Outpatient Therapy:   Denies  Past Medical History:  Past Medical History:  Diagnosis Date   ADHD (attention deficit hyperactivity disorder)    no meds for   Anxiety    Arnold-Chiari deformity (HCC)    Asthma    AVM (arteriovenous malformation) spine 04/21/2018   Bruises easily    Cerebral venous sinus thrombosis, remote, resolved 03/28/2013   Chronic pain    Common migraine with intractable migraine  6/59/9357   Complication of anesthesia age 74    breast lumpectomy heart rate went way down in hospital stayed 2 days, no problems since   Complication of anesthesia    C 1 to C 3 fusion side to side and up and down limited    EDS (Ehlers-Danlos syndrome)    Eustachian tube dysfunction    Fatigue    GERD (gastroesophageal reflux disease)    Glioma (HCC)    low graded tectal plate glioma   Hemorrhoids    History of sleep apnea    no cpap used last 2 years, normal sleep study 2 yrs ago   HSV infection    Hypothyroid    Hypothyroidism    IIH (idiopathic intracranial hypertension)    Lumbar disc herniation    Migraine    Neck stiffness    Nystagmus, positional, central type 03/28/2013   Other malaise and fatigue 03/28/2013   " The patient reports feeling happy, ED, sore" on she has undergone at Chiari malformation surgery decompression in 2011. Prior to the surgery were chief complaints were weakness numbness and neck problems. She had swallowing difficulties and dizziness;  Classic Chiari presentation. But she didn't have headaches.   Photophobia    Pre-diabetes    Tinnitus of both ears    Urinary incontinence  Vision disturbance    Vitamin D deficiency    Weakness    Wears glasses     Past Surgical History:  Procedure Laterality Date   ARNOLD CHIARI REPAIR  2010   Brain shunt  12/30/2019   vp shunt   BRAIN SURGERY     BREAST BIOPSY Left    lumpectomy age 78    BREAST EXCISIONAL BIOPSY Right yrs ago   CHOLECYSTECTOMY  age 20   laparoscopic   colonscopy  2012, 2018   DILATION AND CURETTAGE OF UTERUS  last done yrs ago   x 2 or 3   ENDOMETRIAL ABLATION  02/09/2013   [facility]   HYSTEROSCOPY WITH D & C N/A 06/26/2020   Procedure: DILATATION AND CURETTAGE /HYSTEROSCOPY;  Surgeon: Salvadore Dom, MD;  Location: [facility];  Service: Gynecology;  Laterality: N/A;   INTRAUTERINE DEVICE INSERTION  10/2010   Mirena   IUD REMOVAL  11/2010   could not  tolerate hormonal side effects   metal plate removed from head  2015   screw came loose   neck fusion c1-c3  2010   OPERATIVE ULTRASOUND N/A 06/26/2020   Procedure: OPERATIVE ULTRASOUND;  Surgeon: Salvadore Dom, MD;  [facility];  Service: Gynecology;  Laterality: N/A;   TONSILLECTOMY AND ADENOIDECTOMY  age 24   UPPER GI ENDOSCOPY  2012, 2018   Family History:  Family History  Problem Relation Age of Onset   Aortic dissection Father    Diabetes Maternal Aunt    Breast cancer Maternal Aunt        40's   Diabetes Maternal Uncle    Breast cancer Paternal Aunt 45   Diabetes Maternal Grandmother    Cancer Maternal Grandmother        SKIN AND LUNG   Diabetes Paternal Grandmother    Breast cancer Maternal Aunt        50's   Breast cancer Maternal Aunt        50's   Family Psychiatric  History: As per patient strong family history of severe alcohol use disorder.  She reports a brother has alcohol use disorder, sister, paternal grandfather, and maternal grandfather diagnosed with alcohol use disorder. Social History:  Social History   Substance and Sexual Activity  Alcohol Use No   Comment: h/o binge drinking none in 10 years     Social History   Substance and Sexual Activity  Drug Use Yes   Types: Marijuana   Comment: daily  use of marijuana recently    Social History   Socioeconomic History   Marital status: Divorced    Spouse name: Not on file   Number of children: 1   Years of education: 16   Highest education level: Not on file  Occupational History    Employer: OTHER  Tobacco Use   Smoking status: Former    Packs/day: 2.00    Years: 21.00    Pack years: 42.00    Types: Cigarettes    Quit date: 09/09/2007    Years since quitting: 13.4   Smokeless tobacco: Never  Vaping Use   Vaping Use: Never used  Substance and Sexual Activity   Alcohol use: No    Comment: h/o binge drinking none in 10 years   Drug use: Yes    Types:  Marijuana    Comment: daily  use of marijuana recently   Sexual activity: Yes    Partners: Male    Birth control/protection: Other-see  comments    Comment: Vasectomy-1st intercourse 55 yo-More than 5 partners  Other Topics Concern   Not on file  Social History Narrative   Patient is divorced and lives at home with her one child.   Patient is disabled.   Patient is right-handed.   Patient has a college education.   Patient drinks one cup of coffee daily.   Social Determinants of Health   Financial Resource Strain: Not on file  Food Insecurity: Not on file  Transportation Needs: Not on file  Physical Activity: Not on file  Stress: Not on file  Social Connections: Not on file   Additional Social History:    Allergies:   Allergies  Allergen Reactions   Other Anaphylaxis    Steroids and Zucchini    Prednisone Anaphylaxis   Morphine And Related Itching    Severe    Labs:  Results for orders placed or performed during the hospital encounter of 01/30/21 (from the past 48 hour(s))  CBG monitoring, ED     Status: Abnormal   Collection Time: 01/30/21 11:22 AM  Result Value Ref Range   Glucose-Capillary 207 (H) 70 - 99 mg/dL    Comment: Glucose reference range applies only to samples taken after fasting for at least 8 hours.  CBC with Differential     Status: Abnormal   Collection Time: 01/30/21 11:40 AM  Result Value Ref Range   WBC 26.1 (H) 4.0 - 10.5 K/uL   RBC 4.68 3.87 - 5.11 MIL/uL   Hemoglobin 13.9 12.0 - 15.0 g/dL   HCT 43.0 36.0 - 46.0 %   MCV 91.9 80.0 - 100.0 fL   MCH 29.7 26.0 - 34.0 pg   MCHC 32.3 30.0 - 36.0 g/dL   RDW 13.0 11.5 - 15.5 %   Platelets 268 150 - 400 K/uL   nRBC 0.0 0.0 - 0.2 %   Neutrophils Relative % 92 %   Neutro Abs 23.6 (H) 1.7 - 7.7 K/uL   Lymphocytes Relative 4 %   Lymphs Abs 1.1 0.7 - 4.0 K/uL   Monocytes Relative 3 %   Monocytes Absolute 0.8 0.1 - 1.0 K/uL   Eosinophils Relative 0 %   Eosinophils Absolute 0.1 0.0 - 0.5 K/uL    Basophils Relative 0 %   Basophils Absolute 0.1 0.0 - 0.1 K/uL   WBC Morphology VACUOLATED NEUTROPHILS    Immature Granulocytes 1 %   Abs Immature Granulocytes 0.37 (H) 0.00 - 0.07 K/uL    Comment: Performed at [facility], [place] [address], [place], [place] [address]  Comprehensive metabolic panel     Status: Abnormal   Collection Time: 01/30/21 11:40 AM  Result Value Ref Range   Sodium 137 135 - 145 mmol/L   Potassium 4.2 3.5 - 5.1 mmol/L   Chloride 100 98 - 111 mmol/L   CO2 25 22 - 32 mmol/L   Glucose, Bld 176 (H) 70 - 99 mg/dL    Comment: Glucose reference range applies only to samples taken after fasting for at least 8 hours.   BUN 12 6 - 20 mg/dL   Creatinine, Ser 1.14 (H) 0.44 - 1.00 mg/dL   Calcium 9.0 8.9 - 10.3 mg/dL   Total Protein 8.4 (H) 6.5 - 8.1 g/dL   Albumin 5.1 (H) 3.5 - 5.0 g/dL   AST 35 15 - 41 U/L   ALT 20 0 - 44 U/L   Alkaline Phosphatase 61 38 - 126 U/L   Total Bilirubin 0.8 0.3 -  1.2 mg/dL   GFR, Estimated 57 (L) >60 mL/min    Comment: (NOTE) Calculated using the CKD-EPI Creatinine Equation (2021)    Anion gap 12 5 - 15    Comment: Performed at [facility], [place] [address] [place], [place] [address]  Blood gas, venous (at [facility] and [facility], not at [facility])     Status: Abnormal   Collection Time: 01/30/21 11:40 AM  Result Value Ref Range   FIO2 21.00    pH, Ven 7.073 (LL) 7.250 - 7.430    Comment: REPEATED TO VERIFY CRITICAL RESULT CALLED TO, READ BACK BY AND VERIFIED WITH: SHAW,S. RN AT 1158 01/30/21 MULLINS,T    pCO2, Ven 85.7 (HH) 44.0 - 60.0 mmHg    Comment: REPEATED TO VERIFY CRITICAL RESULT CALLED TO, READ BACK BY AND VERIFIED WITH: SHAW,S. RN AT 1158 01/30/21 MULLINS,T    pO2, Ven 31.8 (LL) 32.0 - 45.0 mmHg    Comment: REPEATED TO VERIFY CRITICAL RESULT CALLED TO, READ BACK BY AND VERIFIED WITH: SHAW,S. RN AT 1158 01/30/21 MULLINS,T    Bicarbonate 23.9 20.0 - 28.0 mmol/L   Acid-base deficit 8.7 (H) 0.0 - 2.0  mmol/L   O2 Saturation 43.4 %   Patient temperature 98.6     Comment: Performed at [facility], [place] [address], [place], [place] [address]  Ethanol     Status: None   Collection Time: 01/30/21 11:40 AM  Result Value Ref Range   Alcohol, Ethyl (B) <10 <10 mg/dL    Comment: (NOTE) Lowest detectable limit for serum alcohol is 10 mg/dL.  For medical purposes only. Performed at [facility], [place] [address] [place], [place] [address]   TSH     Status: Abnormal   Collection Time: 01/30/21 11:40 AM  Result Value Ref Range   TSH 16.268 (H) 0.350 - 4.500 uIU/mL    Comment: Performed by a 3rd Generation assay with a functional sensitivity of <=0.01 uIU/mL. Performed at [facility], [place] [address] [place],  [address]   I-Stat Beta hCG blood, ED ([facility], [facility], [facility] only)     Status: None   Collection Time: 01/30/21 11:48 AM  Result Value Ref Range   I-stat hCG, quantitative <5.0 <5 mIU/mL   Comment 3            Comment:   GEST. AGE      CONC.  (mIU/mL)   <=1 WEEK        5 - 50     2 WEEKS       50 - 500     3 WEEKS       100 - 10,000     4 WEEKS     1,000 - 30,000        FEMALE AND NON-PREGNANT FEMALE:     LESS THAN 5 mIU/mL   Resp Panel by RT-PCR (Flu A&B, Covid) Nasopharyngeal Swab     Status: None   Collection Time: 01/30/21 12:12 PM   Specimen: Nasopharyngeal Swab; Nasopharyngeal(NP) swabs in vial transport medium  Result Value Ref Range   SARS Coronavirus 2 by RT PCR NEGATIVE NEGATIVE    Comment: (NOTE) SARS-CoV-2 target nucleic acids are NOT DETECTED.  The SARS-CoV-2 RNA is generally detectable in upper respiratory specimens during the acute phase of infection. The lowest concentration of SARS-CoV-2 viral copies this assay can detect is 138 copies/mL. A negative result does not preclude SARS-Cov-2 infection and should not be used as the  sole basis for treatment or other patient management decisions. A negative result  may occur with  improper specimen collection/handling, submission of specimen other than nasopharyngeal swab, presence of viral mutation(s) within the areas targeted by this assay, and inadequate number of viral copies(<138 copies/mL). A negative result must be combined with clinical observations, patient history, and epidemiological information. The expected result is Negative.  Fact Sheet for Patients:  EntrepreneurPulse.com.au  Fact Sheet for Healthcare Providers:  IncredibleEmployment.be  This test is no t yet approved or cleared by the [place] FDA and  has been authorized for detection and/or diagnosis of SARS-CoV-2 by FDA under an Emergency Use Authorization (EUA). This EUA will remain  in effect (meaning this test can be used) for the duration of the COVID-19 declaration under Section 564(b)(1) of the Act, 21 U.S.C.section 360bbb-3(b)(1), unless the authorization is terminated  or revoked sooner.       Influenza A by PCR NEGATIVE NEGATIVE   Influenza B by PCR NEGATIVE NEGATIVE    Comment: (NOTE) The Xpert Xpress SARS-CoV-2/FLU/RSV plus assay is intended as an aid in the diagnosis of influenza from Nasopharyngeal swab specimens and should not be used as a sole basis for treatment. Nasal washings and aspirates are unacceptable for Xpert Xpress SARS-CoV-2/FLU/RSV testing.  Fact Sheet for Patients: EntrepreneurPulse.com.au  Fact Sheet for Healthcare Providers: IncredibleEmployment.be  This test is not yet approved or cleared by the [place] FDA and has been authorized for detection and/or diagnosis of SARS-CoV-2 by FDA under an Emergency Use Authorization (EUA). This EUA will remain in effect (meaning this test can be used) for the duration of the COVID-19 declaration under Section 564(b)(1) of the Act, 21 U.S.C. section 360bbb-3(b)(1), unless the authorization is terminated  or revoked.  Performed at [facility], [place] [address], [place], [place] [address]   Blood gas, venous (at [facility] and [facility], not at [facility])     Status: Abnormal   Collection Time: 01/30/21  2:16 PM  Result Value Ref Range   pH, Ven 7.168 (LL) 7.250 - 7.430    Comment: CRITICAL RESULT CALLED TO, READ BACK BY AND VERIFIED WITH:   pCO2, Ven 75.3 (HH) 44.0 - 60.0 mmHg   pO2, Ven 67.9 (H) 32.0 - 45.0 mmHg   Bicarbonate 26.3 20.0 - 28.0 mmol/L   Acid-base deficit 3.7 (H) 0.0 - 2.0 mmol/L   O2 Saturation 90.3 %   Patient temperature 98.6     Comment: Performed at [facility], [place] [address] [place], [place] [address]  Glucose, capillary     Status: None   Collection Time: 01/30/21  6:18 PM  Result Value Ref Range   Glucose-Capillary 91 70 - 99 mg/dL    Comment: Glucose reference range applies only to samples taken after fasting for at least 8 hours.  HIV Antibody (routine testing w rflx)     Status: None   Collection Time: 01/30/21  6:41 PM  Result Value Ref Range   HIV Screen 4th Generation wRfx Non Reactive Non Reactive    Comment: Performed at [facility], [place] [address], [place], [place] [address]  Hemoglobin A1c     Status: None   Collection Time: 01/30/21  6:41 PM  Result Value Ref Range   Hgb A1c MFr Bld 5.1 4.8 - 5.6 %    Comment: (NOTE) Pre diabetes:          5.7%-6.4%  Diabetes:              >  6.4%  Glycemic control for   <7.0% adults with diabetes    Mean Plasma Glucose 99.67 mg/dL    Comment: Performed at [place] [address] [place], [place] [address]  T4, free     Status: Abnormal   Collection Time: 01/30/21  6:41 PM  Result Value Ref Range   Free T4 1.13 (H) 0.61 - 1.12 ng/dL    Comment: (NOTE) Biotin ingestion may interfere with free T4 tests. If the results are inconsistent with the TSH level, previous test results, or the clinical presentation, then consider biotin interference. If needed, order repeat  testing after stopping biotin. Performed at [facility], [place] [address], [place], [place] [address]   Glucose, capillary     Status: Abnormal   Collection Time: 01/30/21 10:24 PM  Result Value Ref Range   Glucose-Capillary 105 (H) 70 - 99 mg/dL    Comment: Glucose reference range applies only to samples taken after fasting for at least 8 hours.  Comprehensive metabolic panel     Status: None   Collection Time: 01/31/21  4:29 AM  Result Value Ref Range   Sodium 138 135 - 145 mmol/L   Potassium 4.5 3.5 - 5.1 mmol/L   Chloride 102 98 - 111 mmol/L   CO2 24 22 - 32 mmol/L   Glucose, Bld 91 70 - 99 mg/dL    Comment: Glucose reference range applies only to samples taken after fasting for at least 8 hours.   BUN 12 6 - 20 mg/dL   Creatinine, Ser 0.74 0.44 - 1.00 mg/dL   Calcium 9.0 8.9 - 10.3 mg/dL   Total Protein 7.9 6.5 - 8.1 g/dL   Albumin 4.7 3.5 - 5.0 g/dL   AST 24 15 - 41 U/L   ALT 18 0 - 44 U/L   Alkaline Phosphatase 56 38 - 126 U/L   Total Bilirubin 0.9 0.3 - 1.2 mg/dL   GFR, Estimated >60 >60 mL/min    Comment: (NOTE) Calculated using the CKD-EPI Creatinine Equation (2021)    Anion gap 12 5 - 15    Comment: Performed at [facility], [place] [address], [place], [place] [address]  CBC     Status: Abnormal   Collection Time: 01/31/21  4:29 AM  Result Value Ref Range   WBC 19.7 (H) 4.0 - 10.5 K/uL   RBC 4.44 3.87 - 5.11 MIL/uL   Hemoglobin 13.3 12.0 - 15.0 g/dL   HCT 40.6 36.0 - 46.0 %   MCV 91.4 80.0 - 100.0 fL   MCH 30.0 26.0 - 34.0 pg   MCHC 32.8 30.0 - 36.0 g/dL   RDW 13.2 11.5 - 15.5 %   Platelets 274 150 - 400 K/uL   nRBC 0.0 0.0 - 0.2 %    Comment: Performed at [facility], [place] [address] [place],  [address]  Blood gas, venous     Status: Abnormal   Collection Time: 01/31/21  4:29 AM  Result Value Ref Range   pH, Ven 7.238 (L) 7.250 - 7.430   pCO2, Ven 61.4 (H) 44.0 - 60.0 mmHg   pO2, Ven 53.5 (H) 32.0 - 45.0  mmHg   Bicarbonate 25.2 20.0 - 28.0 mmol/L   Acid-base deficit 2.9 (H) 0.0 - 2.0 mmol/L   O2 Saturation 86.6 %   Patient temperature 98.6     Comment: Performed at [facility], [place] [address], [place], [place] [address]  Glucose, capillary  Status: Abnormal   Collection Time: 01/31/21  7:33 AM  Result Value Ref Range   Glucose-Capillary 105 (H) 70 - 99 mg/dL    Comment: Glucose reference range applies only to samples taken after fasting for at least 8 hours.  Urinalysis, Routine w reflex microscopic Urine, Clean Catch     Status: Abnormal   Collection Time: 01/31/21  8:45 AM  Result Value Ref Range   Color, Urine YELLOW YELLOW   APPearance CLEAR CLEAR   Specific Gravity, Urine 1.010 1.005 - 1.030   pH 5.0 5.0 - 8.0   Glucose, UA NEGATIVE NEGATIVE mg/dL   Hgb urine dipstick MODERATE (A) NEGATIVE   Bilirubin Urine NEGATIVE NEGATIVE   Ketones, ur 20 (A) NEGATIVE mg/dL   Protein, ur NEGATIVE NEGATIVE mg/dL   Nitrite NEGATIVE NEGATIVE   Leukocytes,Ua NEGATIVE NEGATIVE   RBC / HPF 0-5 0 - 5 RBC/hpf   WBC, UA 0-5 0 - 5 WBC/hpf   Bacteria, UA RARE (A) NONE SEEN   Mucus PRESENT     Comment: Performed at [facility], [place] [address], [place], [place] [address]  Expectorated Sputum Assessment w Gram Stain, Rflx to Resp Cult     Status: None   Collection Time: 01/31/21  8:50 AM   Specimen: Sputum  Result Value Ref Range   Specimen Description SPUTUM    Special Requests NONE    Sputum evaluation      THIS SPECIMEN IS ACCEPTABLE FOR SPUTUM CULTURE Performed at [facility], [place] [address] [place], [place] [address]    Report Status 01/31/2021 FINAL   Culture, Respiratory w Gram Stain     Status: None (Preliminary result)   Collection Time: 01/31/21  8:50 AM   Specimen: SPU  Result Value Ref Range   Specimen Description      SPUTUM Performed at [facility], [place] [address] [place], [place] [address]     Special Requests      NONE Reflexed from [phone] Performed at [facility], [place] [address], [place], [place] [address]    Gram Stain      MODERATE WBC PRESENT,BOTH PMN AND MONONUCLEAR ABUNDANT GRAM NEGATIVE RODS FEW GRAM POSITIVE COCCI RARE GRAM POSITIVE RODS Performed at [facility], [place] [address], [place], [place] [address]    Culture PENDING    Report Status PENDING   Glucose, capillary     Status: Abnormal   Collection Time: 01/31/21 11:59 AM  Result Value Ref Range   Glucose-Capillary 130 (H) 70 - 99 mg/dL    Comment: Glucose reference range applies only to samples taken after fasting for at least 8 hours.  Glucose, capillary     Status: None   Collection Time: 01/31/21  5:51 PM  Result Value Ref Range   Glucose-Capillary 92 70 - 99 mg/dL    Comment: Glucose reference range applies only to samples taken after fasting for at least 8 hours.  Blood gas, arterial     Status: Abnormal   Collection Time: 01/31/21  6:00 PM  Result Value Ref Range   pH, Arterial 7.417 7.350 - 7.450   pCO2 arterial 38.0 32.0 - 48.0 mmHg   pO2, Arterial 70.4 (L) 83.0 - 108.0 mmHg   Bicarbonate 24.0 20.0 - 28.0 mmol/L   Acid-Base Excess 0.2 0.0 - 2.0 mmol/L   O2 Saturation 95.0 %   Patient temperature 98.6    Allens test (pass/fail) PASS PASS    Comment: Performed  at [facility], [place] [address] [place], [place] [address]  Glucose, capillary     Status: None   Collection Time: 01/31/21  8:34 PM  Result Value Ref Range   Glucose-Capillary 92 70 - 99 mg/dL    Comment: Glucose reference range applies only to samples taken after fasting for at least 8 hours.  CBC     Status: Abnormal   Collection Time: 02/01/21  3:27 AM  Result Value Ref Range   WBC 17.0 (H) 4.0 - 10.5 K/uL   RBC 3.86 (L) 3.87 - 5.11 MIL/uL   Hemoglobin 11.4 (L) 12.0 - 15.0 g/dL   HCT 34.8 (L) 36.0 - 46.0 %   MCV 90.2 80.0 - 100.0 fL   MCH 29.5 26.0 - 34.0 pg   MCHC 32.8 30.0 - 36.0  g/dL   RDW 13.0 11.5 - 15.5 %   Platelets 205 150 - 400 K/uL   nRBC 0.0 0.0 - 0.2 %    Comment: Performed at [facility], [place] [address] [place], [place] [address]  Basic metabolic panel     Status: Abnormal   Collection Time: 02/01/21  3:27 AM  Result Value Ref Range   Sodium 135 135 - 145 mmol/L   Potassium 3.7 3.5 - 5.1 mmol/L    Comment: DELTA CHECK NOTED   Chloride 101 98 - 111 mmol/L   CO2 25 22 - 32 mmol/L   Glucose, Bld 83 70 - 99 mg/dL    Comment: Glucose reference range applies only to samples taken after fasting for at least 8 hours.   BUN 8 6 - 20 mg/dL   Creatinine, Ser 0.64 0.44 - 1.00 mg/dL   Calcium 8.5 (L) 8.9 - 10.3 mg/dL   GFR, Estimated >60 >60 mL/min    Comment: (NOTE) Calculated using the CKD-EPI Creatinine Equation (2021)    Anion gap 9 5 - 15    Comment: Performed at [facility], [place] [address], [place], [place] [address]  Magnesium     Status: None   Collection Time: 02/01/21  3:27 AM  Result Value Ref Range   Magnesium 2.0 1.7 - 2.4 mg/dL    Comment: Performed at [facility], [place] [address], [place], [place] [address]  Phosphorus     Status: None   Collection Time: 02/01/21  3:27 AM  Result Value Ref Range   Phosphorus 2.5 2.5 - 4.6 mg/dL    Comment: Performed at [facility], [place] [address] [place], [place] [address]  Glucose, capillary     Status: None   Collection Time: 02/01/21  7:53 AM  Result Value Ref Range   Glucose-Capillary 85 70 - 99 mg/dL    Comment: Glucose reference range applies only to samples taken after fasting for at least 8 hours.   Comment 1 Notify RN    Comment 2 Document in Chart     Current Facility-Administered Medications  Medication Dose Route Frequency Provider Last Rate Last Admin   acetaminophen (TYLENOL) tablet 650 mg  650 mg Oral Q6H PRN [place] Eric J, DO       Or   acetaminophen (TYLENOL) suppository 650 mg  650 mg Rectal Q6H PRN  [place] J, DO       alum & mag hydroxide-simeth (MAALOX/MYLANTA) 200-200-20 MG/5ML suspension 30 mL  30 mL Oral Q6H PRN Kathryne Eriksson, NP   30 mL at 02/01/21 0017   busPIRone (BUSPAR) tablet 7.5 mg  7.5 mg Oral BID [place] Eric J, DO   7.5 mg at 02/01/21 0827   diphenhydrAMINE (BENADRYL) injection 12.5 mg  12.5 mg Intravenous Q8H PRN Florencia Reasons, MD   12.5 mg at 01/31/21 1505   doxycycline (VIBRA-TABS) tablet 100 mg  100 mg Oral Q12H Florencia Reasons, MD   100 mg at 02/01/21 0828   enoxaparin (LOVENOX) injection 40 mg  40 mg Subcutaneous Q24H [place] Eric J, DO   40 mg at 01/30/21 2230   guaiFENesin (MUCINEX) 12 hr tablet 600 mg  600 mg Oral BID Florencia Reasons, MD   600 mg at 02/01/21 7096   haloperidol (HALDOL) tablet 1 mg  1 mg Oral BID Florencia Reasons, MD   1 mg at 02/01/21 2836   Or   haloperidol lactate (HALDOL) injection 1 mg  1 mg Intramuscular BID Florencia Reasons, MD       insulin aspart (novoLOG) injection 0-5 Units  0-5 Units Subcutaneous QHS [place] Eric J, DO       insulin aspart (novoLOG) injection 0-9 Units  0-9 Units Subcutaneous TID WC [place] Donnamarie Poag, DO   1 Units at 01/31/21 1225   ipratropium (ATROVENT) nebulizer solution 0.5 mg  0.5 mg Nebulization TID Florencia Reasons, MD   0.5 mg at 02/01/21 0830   levalbuterol (XOPENEX) nebulizer solution 0.63 mg  0.63 mg Nebulization TID Florencia Reasons, MD   0.63 mg at 02/01/21 0830   levothyroxine (SYNTHROID) tablet 137 mcg  137 mcg Oral q morning [place] Eric J, DO   137 mcg at 02/01/21 0559   naloxone [facility]) injection 0.4 mg  0.4 mg Intravenous PRN [place] Donnamarie Poag, DO       ondansetron [facility]) tablet 4 mg  4 mg Oral Q6H PRN [place] Eric J, DO       Or   ondansetron [facility]) injection 4 mg  4 mg Intravenous Q6H PRN [place] Eric J, DO       oxyCODONE (Oxy IR/ROXICODONE) immediate release tablet 10 mg  10 mg Oral Q4H PRN Florencia Reasons, MD   10 mg at 02/01/21 6294   pantoprazole (PROTONIX) EC tablet 40 mg  40 mg Oral Daily Florencia Reasons, MD   40 mg at 02/01/21 7654    polyethylene glycol (MIRALAX / GLYCOLAX) packet 17 g  17 g Oral Daily PRN [place] Eric J, DO        Musculoskeletal: Strength & Muscle Tone: within normal limits Gait & Station: normal Patient leans: N/A  Psychiatric Specialty Exam:  Presentation  General Appearance: Appropriate for Environment; Casual  Eye Contact:Good  Speech:Clear and Coherent; Normal Rate  Speech Volume:Normal  Handedness:Right   Mood and Affect  Mood:Euthymic  Affect:Appropriate; Congruent   Thought Process  Thought Processes:Coherent; Linear  Descriptions of Associations:Intact  Orientation:Full (Time, Place and Person)  Thought Content:Logical  History of Schizophrenia/Schizoaffective disorder:No data recorded Duration of Psychotic Symptoms:No data recorded Hallucinations:Hallucinations: None  Ideas of Reference:None  Suicidal Thoughts:Suicidal Thoughts: No  Homicidal Thoughts:Homicidal Thoughts: No   Sensorium  Memory:Immediate Good; Recent Good; Remote Good  Judgment:Good  Insight:Good   Executive Functions  Concentration:Good  Attention Span:Good  [place] of Knowledge:Good  Language:Good   Psychomotor Activity  Psychomotor Activity:Psychomotor Activity: Normal   Assets  Assets:Housing; Leisure Time; Data processing manager; Resilience; Physical Health; Desire for Improvement; Communication Skills; Transportation   Sleep  Sleep:Sleep: Good   Physical Exam: Physical Exam Vitals and nursing note reviewed.  Constitutional:      Appearance: Normal appearance. She is normal  weight.  Pulmonary:     Effort: Tachypnea present.     Breath sounds: Transmitted upper airway sounds present.     Comments: Audible wheezing Neurological:     General: No focal deficit present.     Mental Status: She is alert and oriented to person, place, and time. Mental status is at baseline.  Psychiatric:        Mood and Affect: Mood normal.        Behavior: Behavior normal.         Thought Content: Thought content normal.        Judgment: Judgment normal.   Review of Systems  Psychiatric/Behavioral:  The patient is nervous/anxious.   All other systems reviewed and are negative. Blood pressure 137/81, pulse 89, temperature 98.7 F (37.1 C), temperature source Oral, resp. rate 16, height 5\' 7"  (1.702 m), weight 94 kg, SpO2 95 %. Body mass index is 32.46 kg/m.  Treatment Plan Summary: Plan Will continue Haldol 1 mg p.o. twice daily x3 days.  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 -Have also discussed above findings and information with her attending Dr. Annamaria Boots, recommend continuing medical work-up.  Patient's chest x-ray does show some changes from 11 23-11 24, may be worth additional studies. -Continue one-to-one Air cabin crew, and consider initiating delirium precautions. -Also discussed with nursing staff the importance of document in psychiatric behaviors and mental status, as it is important to assess for interventions and ongoing safety measures as it pertains to the patient. -Psychiatry to remain signed off at this time, no additional changes will be made.  Patient continues to not meet criteria for involuntary commitment, or inpatient psychiatric admission. -As previously noted patient is a great candidate for outpatient behavioral health, to include intensive outpatient programming if she is open to that option. -Recommend appropriate management of hypothyroidism as it can also contribute to her mental slowness, irritability, emotional lability, fatigue. TSH > 16.  Disposition: No evidence of imminent risk to self or others at present.   Patient does not meet criteria for psychiatric inpatient admission. Supportive therapy provided about ongoing stressors. Refer to IOP. Discussed crisis plan, support from social network, calling 911, coming to the Emergency  Department, and calling Suicide Hotline.  Suella Broad, FNP 02/01/2021 11:11 AM

## 2021-02-01 NOTE — Progress Notes (Signed)
Patient has been given 10 mg of oxycodone for chronic pain and is currently sleeping. Patient has no further needs at this time.  Night RN stated that patient had no signs of delusions over night. Upon assessment this morning, patient has had no signs of agitation, or delusions. Patient is calm and cooperative. Will continue to monitor.  Layla Maw, RN

## 2021-02-01 NOTE — Progress Notes (Signed)
PROGRESS NOTE    April Meyer  WFU:[id number] DOB: [birth year] DOA: 01/30/2021 PCP: April Rasmussen, MD    Chief Complaint  Patient presents with   Drug Overdose    hy   Cold Exposure    Brief Narrative:   April Meyer is a 55 y.o. female with medical history significant of Ehlers-Danlos syndrome, spinal cord AVM, idiopathic intracranial hypertension s/p VP shunt 2020, Arnold-Chari D formation s/p decompression, migraine headache, chronic pain, hypothyroidism, type 2 diabetes mellitus, GERD who presented to [place] ED on 11/23 after being found down outside at home by neighbors.  EMS was activated, patient was noted to be hypothermic with pinpoint pupils and unresponsive and not breathing well in by EMS and was given intranasal Narcan and placed on a nonrebreather and transported to the ED for further evaluation.  Patient's sister is present at bedside who contributes to the HPI.  Sister lives approximately 3 hours away but talks with her sister about 7-8 times per day via phone call or text message.  Talk to her this morning around 7:00 with no significant concerns.  Patient states does not recall the events this morning and states she has not suicidal.  Denies taking her oxycodone and attempt to hurt herself.  Patient currently on oxycodone 10 mg tablets, filled on 01/03/2021 with 150 tablets, currently has 86.5 pills remaining  Subjective:  She is on room air, no respiratory distress, no hypoxia,  She  appears is  improving, less cough, no wheezing today on exam, no agitation this morning, she appears slightly lethargic, but oriented x3,  Sitter at bedside   Sister reports patient recent has RSV , took steroids inhalor then became delusional and hyperactive, sister reports patient has a history of steroids intolerance Sister thinks the narcotic overdose was incidental  Assessment & Plan:   Principal Problem:   Narcotic overdose ([place]) Active Problems:   History of Chiari malformation    Ehlers-Danlos syndrome, unspecified   Hypothyroid   S/P VP shunt   DM2 (diabetes mellitus, type 2) ([place])   GERD (gastroesophageal reflux disease)   Acute respiratory failure with hypoxia and hypercapnia (HCC)  Acute hypoxic/hypercapnic respiratory failure/acute metabolic encephalopathy Likely from narcotic overdose, she was put on BiPAP, mental status improving, however remain hypoxic requiring oxygen supplement  Asthma exacerbation? Intentional wheezing? She does has productive cough, cxr with bronchitis Sputum culture in process,  started abx/nebs patient has tachycardia with albuterol, but tolerate xopenex pcp states patient has rash with zithromax, but she did ok with augmentin except she needs diflucan when she takes augmentin in the past  she is allergic to steroids improving  Narcotic overdose? Denies suicidal ideation Does has history depression/anxiety, sister report patient's only child passed away a month ago, patient mother passed away earlier this year Inpatient psychiatry input appreciated  Needs UDS appear collected, but I can not locate result  Agitation/delusion? -her previous pcp said she could not tolerate seroquel,   --patient has a very good relationship with her previous pcp, her pcp called and talked to ms April Meyer , able to get her agreeable to get labs tests and urine sample,  -Inpatient psychiatry input appreciated who recommended haldol, and safety sitter,  she is improving today Family at bedside request to discontinue sitter and stop haldol , will discuss with psych  Leukocytosis Repeat  chest x-ray showed bronchitis   UA with rare bacteria, urine culture in process Blood culture no growth Sputum culture in process She received hydration,  will continue for another day Wbc improving  Non-insulin-dependent type 2 diabetes Controlled, A1c 5.1 Home medication metformin 500 mg daily held Carb modified diet  Chronic pain Present with narcotic overdose   chronic pain medication held initially, patient desires to be taken off narcotics, patient already talked to her pcp and pain management Now she desires to restart back due to c/p chronic pain Will discuss with her about decreasing dose As needed Narcan for now  Hypothyroidism TSH 16 Free T4 1.13 Continue current dose of Synthroid   Hx idiopathic intracranial hypertension Follows with neurosurgery outpatient.  S/p VP shunt.  CT head with right frontal ventriculostomy catheter noted with tip in the right lateral ventricle with no mass or midline shift and ventricular size within normal limits. --Outpatient follow-up with neurosurgery  Unresulted Labs (From admission, onward)     Start     Ordered   02/06/21 0500  Creatinine, serum  (enoxaparin (LOVENOX)    CrCl >/= 30 ml/min)  Weekly,   R     Comments: while on enoxaparin therapy    01/30/21 1811   02/02/21 0500  CBC  Tomorrow morning,   R       Question:  Specimen collection method  Answer:  Lab=Lab collect   02/01/21 1859   02/02/21 2130  Basic metabolic panel  Tomorrow morning,   R       Question:  Specimen collection method  Answer:  Lab=Lab collect   02/01/21 1859   02/01/21 0700  Lamotrigine level  Once,   R       Question:  Specimen collection method  Answer:  Lab=Lab collect   02/01/21 0659   01/31/21 1739  Drug Screen 10 W/Conf, Serum  Once,   R       Question:  Specimen collection method  Answer:  Lab=Lab collect   01/31/21 1738   01/31/21 0854  Urine Culture  Once,   R       Question:  Indication  Answer:  Altered mental status (if no other cause identified)   01/31/21 0853   01/30/21 1116  Rapid urine drug screen (hospital performed)  ONCE - STAT,   STAT        01/30/21 1116   01/30/21 1116  Urinalysis, Routine w reflex microscopic Urine, Clean Catch  Once,   STAT        01/30/21 1116              DVT prophylaxis: enoxaparin (LOVENOX) injection 40 mg Start: 01/30/21 2200   Code Status: Full Family  Communication: Sister at bedside daily Disposition:   Status is: Inpatient   Dispo: The patient is from: Home              Anticipated d/c is to: Home              Anticipated d/c date is: 24-48hrs, monitor WBC , follow-up on culture result                 Consultants:  Psychiatry  Procedures:  bipap  Antimicrobials:   Anti-infectives (From admission, onward)    Start     Dose/Rate Route Frequency Ordered Stop   01/31/21 1215  doxycycline (VIBRA-TABS) tablet 100 mg        100 mg Oral Every 12 hours 01/31/21 1121             Objective: Vitals:   02/01/21 0830 02/01/21 0858 02/01/21 1205 02/01/21 1622  BP:  126/70   Pulse:   67   Resp:   20   Temp:  98.7 F (37.1 C) 98.9 F (37.2 C)   TempSrc:  Oral Oral   SpO2: 95%  (!) 82% 96%  Weight:      Height:        Intake/Output Summary (Last 24 hours) at 02/01/2021 1900 Last data filed at 02/01/2021 1528 Gross per 24 hour  Intake 1145 ml  Output 2000 ml  Net -855 ml   Filed Weights   01/31/21 1924  Weight: 94 kg    Examination:  General exam: alert, awake, communicative, slightly lethargic today, but cooperative and pleasant  Respiratory system:  diffuse bilateral wheezing has almost resolved, good aeration,  Respiratory effort normal. Cardiovascular system:  RRR.  Gastrointestinal system: Abdomen is nondistended, soft and nontender.  Normal bowel sounds heard. Central nervous system: Alert and oriented. No focal neurological deficits. Extremities:  no edema Skin: No rashes, lesions or ulcers Psychiatry: aaox3, slightly lethargic but pleasant and cooperative     Data Reviewed: I have personally reviewed following labs and imaging studies  CBC: Recent Labs  Lab 01/30/21 1140 01/31/21 0429 02/01/21 0327  WBC 26.1* 19.7* 17.0*  NEUTROABS 23.6*  --   --   HGB 13.9 13.3 11.4*  HCT 43.0 40.6 34.8*  MCV 91.9 91.4 90.2  PLT 268 274 559    Basic Metabolic Panel: Recent Labs  Lab 01/30/21 1140  01/31/21 0429 02/01/21 0327  NA 137 138 135  K 4.2 4.5 3.7  CL 100 102 101  CO2 25 24 25   GLUCOSE 176* 91 83  BUN 12 12 8   CREATININE 1.14* 0.74 0.64  CALCIUM 9.0 9.0 8.5*  MG  --   --  2.0  PHOS  --   --  2.5    GFR: Estimated Creatinine Clearance: 94.7 mL/min (by C-G formula based on SCr of 0.64 mg/dL).  Liver Function Tests: Recent Labs  Lab 01/30/21 1140 01/31/21 0429  AST 35 24  ALT 20 18  ALKPHOS 61 56  BILITOT 0.8 0.9  PROT 8.4* 7.9  ALBUMIN 5.1* 4.7    CBG: Recent Labs  Lab 01/31/21 1751 01/31/21 2034 02/01/21 0753 02/01/21 1138 02/01/21 1645  GLUCAP 92 92 85 84 103*     Recent Results (from the past 240 hour(s))  Resp Panel by RT-PCR (Flu A&B, Covid) Nasopharyngeal Swab     Status: None   Collection Time: 01/30/21 12:12 PM   Specimen: Nasopharyngeal Swab; Nasopharyngeal(NP) swabs in vial transport medium  Result Value Ref Range Status   SARS Coronavirus 2 by RT PCR NEGATIVE NEGATIVE Final    Comment: (NOTE) SARS-CoV-2 target nucleic acids are NOT DETECTED.  The SARS-CoV-2 RNA is generally detectable in upper respiratory specimens during the acute phase of infection. The lowest concentration of SARS-CoV-2 viral copies this assay can detect is 138 copies/mL. A negative result does not preclude SARS-Cov-2 infection and should not be used as the sole basis for treatment or other patient management decisions. A negative result may occur with  improper specimen collection/handling, submission of specimen other than nasopharyngeal swab, presence of viral mutation(s) within the areas targeted by this assay, and inadequate number of viral copies(<138 copies/mL). A negative result must be combined with clinical observations, patient history, and epidemiological information. The expected result is Negative.  Fact Sheet for Patients:  EntrepreneurPulse.com.au  Fact Sheet for Healthcare Providers:   IncredibleEmployment.be  This test is no t yet approved or cleared by  the [employer] and  has been authorized for detection and/or diagnosis of SARS-CoV-2 by FDA under an Emergency Use Authorization (EUA). This EUA will remain  in effect (meaning this test can be used) for the duration of the COVID-19 declaration under Section 564(b)(1) of the Act, 21 U.S.C.section 360bbb-3(b)(1), unless the authorization is terminated  or revoked sooner.       Influenza A by PCR NEGATIVE NEGATIVE Final   Influenza B by PCR NEGATIVE NEGATIVE Final    Comment: (NOTE) The Xpert Xpress SARS-CoV-2/FLU/RSV plus assay is intended as an aid in the diagnosis of influenza from Nasopharyngeal swab specimens and should not be used as a sole basis for treatment. Nasal washings and aspirates are unacceptable for Xpert Xpress SARS-CoV-2/FLU/RSV testing.  Fact Sheet for Patients: EntrepreneurPulse.com.au  Fact Sheet for Healthcare Providers: IncredibleEmployment.be  This test is not yet approved or cleared by the [place] FDA and has been authorized for detection and/or diagnosis of SARS-CoV-2 by FDA under an Emergency Use Authorization (EUA). This EUA will remain in effect (meaning this test can be used) for the duration of the COVID-19 declaration under Section 564(b)(1) of the Act, 21 U.S.C. section 360bbb-3(b)(1), unless the authorization is terminated or revoked.  Performed at [facility], [place] [address], [place], [place] [address]   Expectorated Sputum Assessment w Gram Stain, Rflx to Resp Cult     Status: None   Collection Time: 01/31/21  8:50 AM   Specimen: Sputum  Result Value Ref Range Status   Specimen Description SPUTUM  Final   Special Requests NONE  Final   Sputum evaluation   Final    THIS SPECIMEN IS ACCEPTABLE FOR SPUTUM CULTURE Performed at [facility], [place] [address],  [place], [place] [address]    Report Status 01/31/2021 FINAL  Final  Culture, Respiratory w Gram Stain     Status: None (Preliminary result)   Collection Time: 01/31/21  8:50 AM   Specimen: SPU  Result Value Ref Range Status   Specimen Description   Final    SPUTUM Performed at [place] [address], [place], [place] [address]    Special Requests   Final    NONE Reflexed from [phone] Performed at [facility], [place] [address] [place], [place] [address]    Gram Stain   Final    MODERATE WBC PRESENT,BOTH PMN AND MONONUCLEAR ABUNDANT GRAM NEGATIVE RODS FEW GRAM POSITIVE COCCI RARE GRAM POSITIVE RODS    Culture   Final    CULTURE REINCUBATED FOR BETTER GROWTH Performed at [facility], [place] [address] [place], [place] [address]    Report Status PENDING  Incomplete  Culture, blood (routine x 2)     Status: None (Preliminary result)   Collection Time: 01/31/21 11:39 AM   Specimen: Left Antecubital; Blood  Result Value Ref Range Status   Specimen Description   Final    LEFT ANTECUBITAL Performed at [place] [address] [place], [place] [address]    Special Requests   Final    BOTTLES DRAWN AEROBIC AND ANAEROBIC Blood Culture adequate volume Performed at [place] [address] [place], [place] [address]    Culture   Final    NO GROWTH 1 DAY Performed at [facility], [place] [address], [place], [place] [address]    Report Status PENDING  Incomplete  Culture, blood (routine x 2)  Status: None (Preliminary result)   Collection Time: 01/31/21 11:50 AM   Specimen: BLOOD RIGHT FOREARM  Result Value Ref Range Status   Specimen Description   Final    BLOOD RIGHT FOREARM Performed at [facility] [address] [place], [place] [address]    Special Requests   Final    BOTTLES DRAWN AEROBIC AND ANAEROBIC Blood Culture adequate volume Performed at [place] [address], [place], [place] [address]    Culture   Final    NO GROWTH 1 DAY Performed at [facility], [place] [address] [place],  [address]    Report Status PENDING  Incomplete         Radiology Studies: DG Chest 2 View  Result Date: 01/31/2021 CLINICAL DATA:  55 year old female with cough. EXAM: CHEST - 2 VIEW COMPARISON:  01/30/2021 and prior studies FINDINGS: The cardiomediastinal silhouette is unchanged. Peribronchial thickening is present. There is no evidence of focal airspace disease, pulmonary edema, suspicious pulmonary nodule/mass, pleural effusion, or pneumothorax. No acute bony abnormalities are identified. LEFT 5th rib deformity again identified. IMPRESSION: Peribronchial thickening without evidence of focal pneumonia. Electronically Signed   By: Margarette Canada M.D.   On: 01/31/2021 13:49        Scheduled Meds:  busPIRone  7.5 mg Oral BID   calcium carbonate  1 tablet Oral BID   doxycycline  100 mg Oral Q12H   enoxaparin (LOVENOX) injection  40 mg Subcutaneous Q24H   guaiFENesin  600 mg Oral BID   haloperidol  1 mg Oral BID   Or   haloperidol lactate  1 mg Intramuscular BID   insulin aspart  0-5 Units Subcutaneous QHS   insulin aspart  0-9 Units Subcutaneous TID WC   ipratropium  0.5 mg Nebulization TID   levalbuterol  0.63 mg Nebulization TID   levothyroxine  137 mcg Oral q morning   pantoprazole  40 mg Oral Daily   Continuous Infusions:  lactated ringers        LOS: 2 days   Time spent: 54mins Greater than 50% of this time was spent in counseling, explanation of diagnosis, planning of further management, and coordination of care.   Voice Recognition Viviann Spare dictation system was used to create this note, attempts have been made to correct errors. Please contact the author with questions and/or clarifications.   Florencia Reasons, MD PhD FACP [facility]  Available via Epic secure chat 7am-7pm for nonurgent  issues Please page for urgent issues To page the attending provider between 7A-7P or the covering provider during after hours 7P-7A, please log into the web site [email or web address] and access using universal [place] password for that web site. If you do not have the password, please call the hospital operator.    02/01/2021, 7:00 PM

## 2021-02-01 NOTE — Progress Notes (Signed)
Patient and sister at bedside, [name], stated that patient has pain management doctor for chronic pain associated with Ehlers-Danlos syndrome. Outside of hospital, patient states that she manages pain with oxycodone. Dr. Erlinda Hong notified and oxycodone has been ordered by MD. See new PRN orders. MD and RN are aware that patient had accidental overdose of oxycodone prior to admission. Will continue to monitor patient as directed.

## 2021-02-02 DIAGNOSIS — Q796 Ehlers-Danlos syndrome, unspecified: Secondary | ICD-10-CM | POA: Diagnosis not present

## 2021-02-02 DIAGNOSIS — Z982 Presence of cerebrospinal fluid drainage device: Secondary | ICD-10-CM | POA: Diagnosis not present

## 2021-02-02 DIAGNOSIS — J9601 Acute respiratory failure with hypoxia: Secondary | ICD-10-CM | POA: Diagnosis not present

## 2021-02-02 DIAGNOSIS — T40601A Poisoning by unspecified narcotics, accidental (unintentional), initial encounter: Secondary | ICD-10-CM | POA: Diagnosis not present

## 2021-02-02 LAB — BASIC METABOLIC PANEL
Anion gap: 8 (ref 5–15)
BUN: 8 mg/dL (ref 6–20)
CO2: 27 mmol/L (ref 22–32)
Calcium: 8.4 mg/dL — ABNORMAL LOW (ref 8.9–10.3)
Chloride: 102 mmol/L (ref 98–111)
Creatinine, Ser: 0.52 mg/dL (ref 0.44–1.00)
GFR, Estimated: >60 mL/min (ref >60)
Glucose, Bld: 83 mg/dL (ref 70–99)
Potassium: 3.4 mmol/L — ABNORMAL LOW (ref 3.5–5.1)
Sodium: 137 mmol/L (ref 135–145)

## 2021-02-02 LAB — GLUCOSE, CAPILLARY
Glucose-Capillary: 77 mg/dL (ref 70–99)
Glucose-Capillary: 104 mg/dL — ABNORMAL HIGH (ref 70–99)
Glucose-Capillary: 101 mg/dL — ABNORMAL HIGH (ref 70–99)
Glucose-Capillary: 114 mg/dL — ABNORMAL HIGH (ref 70–99)

## 2021-02-02 LAB — URINE CULTURE: Culture: <10000 — AB

## 2021-02-02 LAB — CULTURE, RESPIRATORY W GRAM STAIN

## 2021-02-02 LAB — CBC
HCT: 33.2 % — ABNORMAL LOW (ref 36.0–46.0)
Hemoglobin: 11.1 g/dL — ABNORMAL LOW (ref 12.0–15.0)
MCH: 30 pg (ref 26.0–34.0)
MCHC: 33.4 g/dL (ref 30.0–36.0)
MCV: 89.7 fL (ref 80.0–100.0)
Platelets: 188 10*3/uL (ref 150–400)
RBC: 3.7 MIL/uL — ABNORMAL LOW (ref 3.87–5.11)
RDW: 13.1 % (ref 11.5–15.5)
WBC: 10.8 10*3/uL — ABNORMAL HIGH (ref 4.0–10.5)
nRBC: 0 % (ref 0.0–0.2)

## 2021-02-02 MED ORDER — POTASSIUM CHLORIDE 20 MEQ PO PACK
40.0000 meq | PACK | Freq: Once | ORAL | Status: AC
  Administered 2021-02-02: 40 meq via ORAL
  Filled 2021-02-02: qty 2

## 2021-02-02 MED ORDER — DIPHENHYDRAMINE HCL 12.5 MG/5ML PO ELIX
12.5000 mg | ORAL_SOLUTION | Freq: Three times a day (TID) | ORAL | Status: DC | PRN
  Administered 2021-02-04: 22:00:00 12.5 mg via ORAL
  Filled 2021-02-02: qty 5

## 2021-02-02 MED ORDER — AMOXICILLIN-POT CLAVULANATE 875-125 MG PO TABS
1.0000 | ORAL_TABLET | Freq: Two times a day (BID) | ORAL | Status: DC
  Administered 2021-02-02: 1 via ORAL
  Filled 2021-02-02: qty 1

## 2021-02-02 MED ORDER — HALOPERIDOL LACTATE 5 MG/ML IJ SOLN
1.0000 mg | Freq: Two times a day (BID) | INTRAMUSCULAR | Status: DC
  Administered 2021-02-04 – 2021-02-05 (×2): 1 mg via INTRAMUSCULAR
  Filled 2021-02-02 (×2): qty 1

## 2021-02-02 MED ORDER — SODIUM CHLORIDE 0.9 % IV SOLN: INTRAVENOUS | Status: DC | PRN

## 2021-02-02 MED ORDER — HALOPERIDOL 0.5 MG PO TABS
0.5000 mg | ORAL_TABLET | Freq: Two times a day (BID) | ORAL | Status: DC
  Administered 2021-02-02 – 2021-02-04 (×4): 0.5 mg via ORAL
  Filled 2021-02-02 (×6): qty 1

## 2021-02-02 MED ORDER — CALCIUM CARBONATE ANTACID 500 MG PO CHEW
1.0000 | CHEWABLE_TABLET | Freq: Three times a day (TID) | ORAL | Status: DC | PRN
  Administered 2021-02-03: 07:00:00 200 mg via ORAL
  Filled 2021-02-02 (×2): qty 1

## 2021-02-02 MED ORDER — POTASSIUM CHLORIDE 10 MEQ/100ML IV SOLN
10.0000 meq | INTRAVENOUS | Status: AC
  Administered 2021-02-02: 10 meq via INTRAVENOUS
  Filled 2021-02-02 (×2): qty 100

## 2021-02-02 MED ORDER — SENNOSIDES-DOCUSATE SODIUM 8.6-50 MG PO TABS
1.0000 | ORAL_TABLET | Freq: Two times a day (BID) | ORAL | Status: DC
  Administered 2021-02-02 – 2021-02-05 (×6): 1 via ORAL
  Filled 2021-02-02 (×6): qty 1

## 2021-02-02 MED ORDER — SODIUM CHLORIDE 0.9 % IV SOLN
1.0000 g | Freq: Three times a day (TID) | INTRAVENOUS | Status: AC
  Administered 2021-02-02 – 2021-02-04 (×8): 1 g via INTRAVENOUS
  Filled 2021-02-02 (×8): qty 1

## 2021-02-02 MED ORDER — FAMOTIDINE 20 MG PO TABS
10.0000 mg | ORAL_TABLET | Freq: Two times a day (BID) | ORAL | Status: DC
  Administered 2021-02-02 – 2021-02-03 (×4): 10 mg via ORAL
  Filled 2021-02-02 (×4): qty 1

## 2021-02-02 NOTE — Progress Notes (Signed)
PHARMACIST - PHYSICIAN COMMUNICATION    CONCERNING: IV to Oral Route Change Policy  RECOMMENDATION: This patient is receiving benadryl by the intravenous route.  Based on criteria approved by the Pharmacy and Therapeutics Committee, the intravenous medication(s) is/are being converted to the equivalent oral dose form(s).   DESCRIPTION: These criteria include: The patient is eating (either orally or via tube) and/or has been taking other orally administered medications for a least 24 hours The patient has no evidence of active gastrointestinal bleeding or impaired GI absorption (gastrectomy, short bowel, patient on TNA or NPO).  If you have questions about this conversion, please contact the Pharmacy Department  []   [phone] )  [facility] []   [phone] )  [facility] []   [phone] )  [facility] []   [phone] )  [facility] [x]   [phone] )  [facility]

## 2021-02-02 NOTE — Progress Notes (Signed)
PROGRESS NOTE    April Meyer  XFG:[id number] DOB: 1966/03/02 DOA: 01/30/2021 PCP: Hayden Rasmussen, MD    Chief Complaint  Patient presents with   Drug Overdose    hy   Cold Exposure    Brief Narrative:   April Meyer is a 55 y.o. female with medical history significant of Ehlers-Danlos syndrome, spinal cord AVM, idiopathic intracranial hypertension s/p VP shunt 2020, Arnold-Chari D formation s/p decompression, migraine headache, chronic pain, hypothyroidism, type 2 diabetes mellitus, GERD who presented to [facility] on 11/23 after being found down outside at home by neighbors.  EMS was activated, patient was noted to be hypothermic with pinpoint pupils and unresponsive and not breathing well in by EMS and was given intranasal Narcan and placed on a nonrebreather and transported to the ED for further evaluation.  Patient's sister is present at bedside who contributes to the HPI.  Sister lives approximately 3 hours away but talks with her sister about 7-8 times per day via phone call or text message.  Talk to her this morning around 7:00 with no significant concerns.  Patient states does not recall the events this morning and states she has not suicidal.  Denies taking her oxycodone and attempt to hurt herself.  Patient currently on oxycodone 10 mg tablets, filled on 01/03/2021 with 150 tablets, currently has 86.5 pills remaining  Subjective:  She is on room air, no respiratory distress, no hypoxia,  Continue to have some productive cough but has improved Reports haldol helped but made her a little sleepy C/o heart burn  Sitter at bedside    Assessment & Plan:   Principal Problem:   Narcotic overdose ([place]) Active Problems:   History of Chiari malformation   Ehlers-Danlos syndrome, unspecified   Hypothyroid   S/P VP shunt   DM2 (diabetes mellitus, type 2) (HCC)   GERD (gastroesophageal reflux disease)   Acute respiratory failure with hypoxia and hypercapnia (HCC)  Acute  hypoxic/hypercapnic respiratory failure/acute metabolic encephalopathy Likely from narcotic overdose, she was put on BiPAP, mental status improving, however remain hypoxic requiring oxygen supplement  Asthma exacerbation? Intentional wheezing? She does has productive cough, cxr with bronchitis Sputum culture in process,  started abx/nebs patient has tachycardia with albuterol, but tolerate xopenex pcp states patient has rash with zithromax, but she did ok with augmentin except she needs diflucan when she takes augmentin in the past  she is allergic to steroids She started to c/o heart burn, will change doxy to augmentin, continue monitoring sputum culture improving  Narcotic overdose? Denies suicidal ideation Does has history depression/anxiety, sister report patient's only child passed away a month ago, patient mother passed away earlier this year Inpatient psychiatry input appreciated  Needs UDS appear collected, but I can not locate result  Agitation/delusion? -Sister reports patient recent has RSV , took steroids inhalor then became delusional and hyperactive, sister reports patient has a history of steroids intolerance Sister thinks the narcotic overdose was incidental -her previous pcp said she could not tolerate seroquel,   --patient has a very good relationship with her previous pcp, her pcp called and talked to ms Toelle , able to get her agreeable to get labs tests and urine sample,  -Inpatient psychiatry input appreciated who recommended haldol, and safety sitter,  she is improving today -sitter discontinued, decrease haldol dose  Leukocytosis Repeat  chest x-ray showed bronchitis   UA with rare bacteria, urine culture in process Blood culture no growth Sputum culture in process She  received hydration, will continue for another day Wbc improving  Hypokalemia, replace K, check mag  Heart burn, also has tenderness on chest wall, could also have musculoskelatel pain from  coughing  continue ppi, add pepcid, discontinue doxycycline Consider outaptient EGD if patient does not improve after recovered from bronchitis   Non-insulin-dependent type 2 diabetes Controlled, A1c 5.1 Home medication metformin 500 mg daily held Carb modified diet  Chronic pain Present with narcotic overdose  chronic pain medication held initially, patient desires to be taken off narcotics, patient already talked to her pcp and pain management Now she desires to restart back due to c/p chronic pain Will discuss with her about decreasing dose As needed Narcan for now  Hypothyroidism TSH 16 Free T4 1.13 Continue current dose of Synthroid   Hx idiopathic intracranial hypertension Follows with neurosurgery outpatient.  S/p VP shunt.  CT head with right frontal ventriculostomy catheter noted with tip in the right lateral ventricle with no mass or midline shift and ventricular size within normal limits. --Outpatient follow-up with neurosurgery  Unresulted Labs (From admission, onward)     Start     Ordered   02/06/21 0500  Creatinine, serum  (enoxaparin (LOVENOX)    CrCl >/= 30 ml/min)  Weekly,   R     Comments: while on enoxaparin therapy    01/30/21 1811   02/03/21 0500  CBC with Differential/Platelet  Tomorrow morning,   R       Question:  Specimen collection method  Answer:  Lab=Lab collect   02/02/21 0758   02/03/21 9417  Basic metabolic panel  Tomorrow morning,   R       Question:  Specimen collection method  Answer:  Lab=Lab collect   02/02/21 0758   02/03/21 0500  Magnesium  Tomorrow morning,   STAT       Question:  Specimen collection method  Answer:  Lab=Lab collect   02/02/21 0758   02/01/21 0700  Lamotrigine level  Once,   R       Question:  Specimen collection method  Answer:  Lab=Lab collect   02/01/21 0659   01/31/21 1739  Drug Screen 10 W/Conf, Serum  Once,   R       Question:  Specimen collection method  Answer:  Lab=Lab collect   01/31/21 1738   01/31/21  0854  Urine Culture  Once,   R       Question:  Indication  Answer:  Altered mental status (if no other cause identified)   01/31/21 0853   01/30/21 1116  Rapid urine drug screen (hospital performed)  ONCE - STAT,   STAT        01/30/21 1116   01/30/21 1116  Urinalysis, Routine w reflex microscopic Urine, Clean Catch  Once,   STAT        01/30/21 1116              DVT prophylaxis: enoxaparin (LOVENOX) injection 40 mg Start: 01/30/21 2200   Code Status: Full Family Communication: Sister at bedside daily Disposition:   Status is: Inpatient   Dispo: The patient is from: Home              Anticipated d/c is to: Home              Anticipated d/c date is: 24-48hrs, monitor WBC , follow-up on culture result                 Consultants:  Psychiatry  Procedures:  bipap  Antimicrobials:   Anti-infectives (From admission, onward)    Start     Dose/Rate Route Frequency Ordered Stop   01/31/21 1215  doxycycline (VIBRA-TABS) tablet 100 mg        100 mg Oral Every 12 hours 01/31/21 1121             Objective: Vitals:   02/01/21 1622 02/01/21 1949 02/01/21 2040 02/02/21 0515  BP:   (!) 124/58 137/77  Pulse:  76 64 (!) 56  Resp:  18 16   Temp:   99.4 F (37.4 C) 98.5 F (36.9 C)  TempSrc:   Oral Oral  SpO2: 96% 96% 93% 95%  Weight:      Height:        Intake/Output Summary (Last 24 hours) at 02/02/2021 0758 Last data filed at 02/02/2021 0600 Gross per 24 hour  Intake 1160.76 ml  Output 800 ml  Net 360.76 ml   Filed Weights   01/31/21 1924  Weight: 94 kg    Examination:  General exam: alert, awake, communicative, slightly lethargic today, but cooperative and pleasant  Respiratory system:  diffuse bilateral wheezing has almost resolved, occasional rhonchi, clears up after productive cough, good aeration,  Respiratory effort normal. Chest wall tenderness/epigastric tenderness Cardiovascular system:  RRR.  Gastrointestinal system: Abdomen is nondistended,  soft and nontender.  Normal bowel sounds heard. Central nervous system: Alert and oriented. No focal neurological deficits. Extremities:  no edema Skin: No rashes, lesions or ulcers Psychiatry: aaox3, slightly lethargic but pleasant and cooperative     Data Reviewed: I have personally reviewed following labs and imaging studies  CBC: Recent Labs  Lab 01/30/21 1140 01/31/21 0429 02/01/21 0327 02/02/21 0358  WBC 26.1* 19.7* 17.0* 10.8*  NEUTROABS 23.6*  --   --   --   HGB 13.9 13.3 11.4* 11.1*  HCT 43.0 40.6 34.8* 33.2*  MCV 91.9 91.4 90.2 89.7  PLT 268 274 205 696    Basic Metabolic Panel: Recent Labs  Lab 01/30/21 1140 01/31/21 0429 02/01/21 0327 02/02/21 0358  NA 137 138 135 137  K 4.2 4.5 3.7 3.4*  CL 100 102 101 102  CO2 25 24 25 27   GLUCOSE 176* 91 83 83  BUN 12 12 8 8   CREATININE 1.14* 0.74 0.64 0.52  CALCIUM 9.0 9.0 8.5* 8.4*  MG  --   --  2.0  --   PHOS  --   --  2.5  --     GFR: Estimated Creatinine Clearance: 94.7 mL/min (by C-G formula based on SCr of 0.52 mg/dL).  Liver Function Tests: Recent Labs  Lab 01/30/21 1140 01/31/21 0429  AST 35 24  ALT 20 18  ALKPHOS 61 56  BILITOT 0.8 0.9  PROT 8.4* 7.9  ALBUMIN 5.1* 4.7    CBG: Recent Labs  Lab 02/01/21 0753 02/01/21 1138 02/01/21 1645 02/01/21 2041 02/02/21 0727  GLUCAP 85 84 103* 115* 77     Recent Results (from the past 240 hour(s))  Resp Panel by RT-PCR (Flu A&B, Covid) Nasopharyngeal Swab     Status: None   Collection Time: 01/30/21 12:12 PM   Specimen: Nasopharyngeal Swab; Nasopharyngeal(NP) swabs in vial transport medium  Result Value Ref Range Status   SARS Coronavirus 2 by RT PCR NEGATIVE NEGATIVE Final    Comment: (NOTE) SARS-CoV-2 target nucleic acids are NOT DETECTED.  The SARS-CoV-2 RNA is generally detectable in upper respiratory specimens during the acute phase of infection. The lowest concentration of  SARS-CoV-2 viral copies this assay can detect is 138  copies/mL. A negative result does not preclude SARS-Cov-2 infection and should not be used as the sole basis for treatment or other patient management decisions. A negative result may occur with  improper specimen collection/handling, submission of specimen other than nasopharyngeal swab, presence of viral mutation(s) within the areas targeted by this assay, and inadequate number of viral copies(<138 copies/mL). A negative result must be combined with clinical observations, patient history, and epidemiological information. The expected result is Negative.  Fact Sheet for Patients:  EntrepreneurPulse.com.au  Fact Sheet for Healthcare Providers:  IncredibleEmployment.be  This test is no t yet approved or cleared by the [place] FDA and  has been authorized for detection and/or diagnosis of SARS-CoV-2 by FDA under an Emergency Use Authorization (EUA). This EUA will remain  in effect (meaning this test can be used) for the duration of the COVID-19 declaration under Section 564(b)(1) of the Act, 21 U.S.C.section 360bbb-3(b)(1), unless the authorization is terminated  or revoked sooner.       Influenza A by PCR NEGATIVE NEGATIVE Final   Influenza B by PCR NEGATIVE NEGATIVE Final    Comment: (NOTE) The Xpert Xpress SARS-CoV-2/FLU/RSV plus assay is intended as an aid in the diagnosis of influenza from Nasopharyngeal swab specimens and should not be used as a sole basis for treatment. Nasal washings and aspirates are unacceptable for Xpert Xpress SARS-CoV-2/FLU/RSV testing.  Fact Sheet for Patients: EntrepreneurPulse.com.au  Fact Sheet for Healthcare Providers: IncredibleEmployment.be  This test is not yet approved or cleared by the [place] FDA and has been authorized for detection and/or diagnosis of SARS-CoV-2 by FDA under an Emergency Use Authorization (EUA). This EUA will remain in effect (meaning  this test can be used) for the duration of the COVID-19 declaration under Section 564(b)(1) of the Act, 21 U.S.C. section 360bbb-3(b)(1), unless the authorization is terminated or revoked.  Performed at [facility], [place] [address], [place], [place] [address]   Expectorated Sputum Assessment w Gram Stain, Rflx to Resp Cult     Status: None   Collection Time: 01/31/21  8:50 AM   Specimen: Sputum  Result Value Ref Range Status   Specimen Description SPUTUM  Final   Special Requests NONE  Final   Sputum evaluation   Final    THIS SPECIMEN IS ACCEPTABLE FOR SPUTUM CULTURE Performed at [facility], [place] [address], [place], [place] [address]    Report Status 01/31/2021 FINAL  Final  Culture, Respiratory w Gram Stain     Status: None (Preliminary result)   Collection Time: 01/31/21  8:50 AM   Specimen: SPU  Result Value Ref Range Status   Specimen Description   Final    SPUTUM Performed at [facility] [address], [place], [place] [address]    Special Requests   Final    NONE Reflexed from [phone] Performed at [facility], [place] [address] [place], [place] [address]    Gram Stain   Final    MODERATE WBC PRESENT,BOTH PMN AND MONONUCLEAR ABUNDANT GRAM NEGATIVE RODS FEW GRAM POSITIVE COCCI RARE GRAM POSITIVE RODS    Culture   Final    CULTURE REINCUBATED FOR BETTER GROWTH Performed at [facility], [place] [address], [place], [place] [address]    Report Status PENDING  Incomplete  Culture, blood (routine x 2)     Status: None (Preliminary result)   Collection Time: 01/31/21 11:39 AM  Specimen: Left Antecubital; Blood  Result Value Ref Range Status   Specimen Description   Final    LEFT ANTECUBITAL Performed at [facility] [address] [place], [place] [address]    Special Requests   Final    BOTTLES DRAWN AEROBIC AND ANAEROBIC Blood Culture adequate volume Performed  at [facility] [place] [address] [place], [place] [address]    Culture   Final    NO GROWTH 1 DAY Performed at [facility], [place] [address], [place], [place] [address]    Report Status PENDING  Incomplete  Culture, blood (routine x 2)     Status: None (Preliminary result)   Collection Time: 01/31/21 11:50 AM   Specimen: BLOOD RIGHT FOREARM  Result Value Ref Range Status   Specimen Description   Final    BLOOD RIGHT FOREARM Performed at [facility] [address], [place], [place] [address]    Special Requests   Final    BOTTLES DRAWN AEROBIC AND ANAEROBIC Blood Culture adequate volume Performed at [facility] [address], [place], [place] [address]    Culture   Final    NO GROWTH 1 DAY Performed at [facility], [place] [address], [place], [place] [address]    Report Status PENDING  Incomplete         Radiology Studies: DG Chest 2 View  Result Date: 01/31/2021 CLINICAL DATA:  55 year old female with cough. EXAM: CHEST - 2 VIEW COMPARISON:  01/30/2021 and prior studies FINDINGS: The cardiomediastinal silhouette is unchanged. Peribronchial thickening is present. There is no evidence of focal airspace disease, pulmonary edema, suspicious pulmonary nodule/mass, pleural effusion, or pneumothorax. No acute bony abnormalities are identified. LEFT 5th rib deformity again identified. IMPRESSION: Peribronchial thickening without evidence of focal pneumonia. Electronically Signed   By: Margarette Canada M.D.   On: 01/31/2021 13:49        Scheduled Meds:  busPIRone  7.5 mg Oral BID   calcium carbonate  1 tablet Oral BID   doxycycline  100 mg Oral Q12H   enoxaparin (LOVENOX) injection  40 mg Subcutaneous Q24H   guaiFENesin  600 mg Oral BID   haloperidol  1 mg Oral BID   Or   haloperidol lactate  1 mg Intramuscular BID   insulin aspart  0-5 Units Subcutaneous QHS   insulin aspart  0-9 Units Subcutaneous TID WC    ipratropium  0.5 mg Nebulization TID   levalbuterol  0.63 mg Nebulization TID   levothyroxine  137 mcg Oral q morning   pantoprazole  40 mg Oral Daily   potassium chloride  40 mEq Oral Once   senna-docusate  1 tablet Oral BID   Continuous Infusions:  lactated ringers 75 mL/hr at 02/01/21 2054      LOS: 3 days   Time spent: 29mins Greater than 50% of this time was spent in counseling, explanation of diagnosis, planning of further management, and coordination of care.   Voice Recognition Viviann Spare dictation system was used to create this note, attempts have been made to correct errors. Please contact the author with questions and/or clarifications.   Florencia Reasons, MD PhD FACP [facility]  Available via Epic secure chat 7am-7pm for nonurgent issues Please page for urgent issues To page the attending provider between 7A-7P or the covering provider during after hours 7P-7A, please log into the web site [email or web address] and access using universal [place] password for that web site.  If you do not have the password, please call the hospital operator.    02/02/2021, 7:58 AM

## 2021-02-03 DIAGNOSIS — Q796 Ehlers-Danlos syndrome, unspecified: Secondary | ICD-10-CM | POA: Diagnosis not present

## 2021-02-03 DIAGNOSIS — J9601 Acute respiratory failure with hypoxia: Secondary | ICD-10-CM | POA: Diagnosis not present

## 2021-02-03 DIAGNOSIS — T40601A Poisoning by unspecified narcotics, accidental (unintentional), initial encounter: Secondary | ICD-10-CM | POA: Diagnosis not present

## 2021-02-03 DIAGNOSIS — Z982 Presence of cerebrospinal fluid drainage device: Secondary | ICD-10-CM | POA: Diagnosis not present

## 2021-02-03 LAB — CBC WITH DIFFERENTIAL/PLATELET
Abs Immature Granulocytes: 0.07 10*3/uL (ref 0.00–0.07)
Basophils Absolute: 0.1 10*3/uL (ref 0.0–0.1)
Basophils Relative: 1 %
Eosinophils Absolute: 0.2 10*3/uL (ref 0.0–0.5)
Eosinophils Relative: 2 %
HCT: 33.5 % — ABNORMAL LOW (ref 36.0–46.0)
Hemoglobin: 11.3 g/dL — ABNORMAL LOW (ref 12.0–15.0)
Immature Granulocytes: 1 %
Lymphocytes Relative: 21 %
Lymphs Abs: 2.1 10*3/uL (ref 0.7–4.0)
MCH: 30.1 pg (ref 26.0–34.0)
MCHC: 33.7 g/dL (ref 30.0–36.0)
MCV: 89.1 fL (ref 80.0–100.0)
Monocytes Absolute: 0.9 10*3/uL (ref 0.1–1.0)
Monocytes Relative: 9 %
Neutro Abs: 7 10*3/uL (ref 1.7–7.7)
Neutrophils Relative %: 66 %
Platelets: 202 10*3/uL (ref 150–400)
RBC: 3.76 MIL/uL — ABNORMAL LOW (ref 3.87–5.11)
RDW: 13 % (ref 11.5–15.5)
WBC: 10.2 10*3/uL (ref 4.0–10.5)
nRBC: 0 % (ref 0.0–0.2)

## 2021-02-03 LAB — BASIC METABOLIC PANEL
Anion gap: 7 (ref 5–15)
BUN: 8 mg/dL (ref 6–20)
CO2: 29 mmol/L (ref 22–32)
Calcium: 8.5 mg/dL — ABNORMAL LOW (ref 8.9–10.3)
Chloride: 101 mmol/L (ref 98–111)
Creatinine, Ser: 0.52 mg/dL (ref 0.44–1.00)
GFR, Estimated: >60 mL/min (ref >60)
Glucose, Bld: 101 mg/dL — ABNORMAL HIGH (ref 70–99)
Potassium: 4 mmol/L (ref 3.5–5.1)
Sodium: 137 mmol/L (ref 135–145)

## 2021-02-03 LAB — MAGNESIUM: Magnesium: 1.8 mg/dL (ref 1.7–2.4)

## 2021-02-03 LAB — GLUCOSE, CAPILLARY
Glucose-Capillary: 93 mg/dL (ref 70–99)
Glucose-Capillary: 101 mg/dL — ABNORMAL HIGH (ref 70–99)
Glucose-Capillary: 91 mg/dL (ref 70–99)
Glucose-Capillary: 108 mg/dL — ABNORMAL HIGH (ref 70–99)

## 2021-02-03 MED ORDER — NYSTATIN 100000 UNIT/ML MT SUSP
5.0000 mL | Freq: Four times a day (QID) | OROMUCOSAL | Status: DC
Start: 2021-02-03 — End: 2021-02-05
  Administered 2021-02-03 – 2021-02-05 (×6): 500000 [IU] via ORAL
  Filled 2021-02-03 (×6): qty 5

## 2021-02-03 MED ORDER — ALUM & MAG HYDROXIDE-SIMETH 200-200-20 MG/5ML PO SUSP
30.0000 mL | ORAL | Status: DC | PRN
  Administered 2021-02-03: 08:00:00 30 mL via ORAL
  Filled 2021-02-03: qty 30

## 2021-02-03 MED ORDER — MAGNESIUM SULFATE 2 GM/50ML IV SOLN
2.0000 g | Freq: Once | INTRAVENOUS | Status: AC
  Administered 2021-02-03: 09:00:00 2 g via INTRAVENOUS
  Filled 2021-02-03: qty 50

## 2021-02-03 MED ORDER — BISACODYL 10 MG RE SUPP
10.0000 mg | Freq: Every day | RECTAL | Status: AC
  Administered 2021-02-03: 13:00:00 10 mg via RECTAL
  Filled 2021-02-03: qty 1

## 2021-02-03 MED ORDER — PANTOPRAZOLE SODIUM 40 MG PO TBEC
40.0000 mg | DELAYED_RELEASE_TABLET | Freq: Two times a day (BID) | ORAL | Status: DC
  Administered 2021-02-04 – 2021-02-05 (×2): 40 mg via ORAL
  Filled 2021-02-03 (×2): qty 1

## 2021-02-03 MED ORDER — LEVALBUTEROL HCL 0.63 MG/3ML IN NEBU
0.6300 mg | INHALATION_SOLUTION | Freq: Two times a day (BID) | RESPIRATORY_TRACT | Status: DC
Start: 2021-02-03 — End: 2021-02-04
  Administered 2021-02-03 – 2021-02-04 (×2): 0.63 mg via RESPIRATORY_TRACT
  Filled 2021-02-03 (×2): qty 3

## 2021-02-03 MED ORDER — IPRATROPIUM BROMIDE 0.02 % IN SOLN
0.5000 mg | Freq: Two times a day (BID) | RESPIRATORY_TRACT | Status: DC
  Administered 2021-02-03 – 2021-02-04 (×2): 0.5 mg via RESPIRATORY_TRACT
  Filled 2021-02-03 (×2): qty 2.5

## 2021-02-03 NOTE — Progress Notes (Addendum)
PROGRESS NOTE    ARLYN BUMPUS  JKK:[id number] DOB: Dec 31, 1965 DOA: 01/30/2021 PCP: Hayden Rasmussen, MD    Chief Complaint  Patient presents with   Drug Overdose    hy   Cold Exposure    Brief Narrative:   JIANA LEMAIRE is a 55 y.o. female with medical history significant of Ehlers-Danlos syndrome, spinal cord AVM, idiopathic intracranial hypertension s/p VP shunt 2020, Arnold-Chari D formation s/p decompression, migraine headache, chronic pain, hypothyroidism, type 2 diabetes mellitus, GERD who presented to [facility] on 11/23 after being found down outside at home by neighbors.  EMS was activated, patient was noted to be hypothermic with pinpoint pupils and unresponsive and not breathing well in by EMS and was given intranasal Narcan and placed on a nonrebreather and transported to the ED for further evaluation.  Patient's sister is present at bedside who contributes to the HPI.  Sister lives approximately 3 hours away but talks with her sister about 7-8 times per day via phone call or text message.  Talk to her this morning around 7:00 with no significant concerns.  Patient states does not recall the events this morning and states she has not suicidal.  Denies taking her oxycodone and attempt to hurt herself.  Patient currently on oxycodone 10 mg tablets, filled on 01/03/2021 with 150 tablets, currently has 86.5 pills remaining  She has improved, found to have bronchitis, c/o dysphagia, significant heart burn, getting DG esophagus, need to call GI Dr Benson Norway on monday  Subjective:  She is aaox3, calm and cooperative,  She is on room air, no respiratory distress, no hypoxia, cough appear has almost resolved Now reports has been having significant heart burn, feeling food get stuck mid/lower chest region, intermittent n/v for the last two weeks, reports her GI doctor is Dr Benson Norway Reports no bm, wants suppository   Reports haldol helped , desires to stay on it while in the hospital     Assessment  & Plan:   Principal Problem:   Narcotic overdose ([place]) Active Problems:   History of Chiari malformation   Ehlers-Danlos syndrome, unspecified   Hypothyroid   S/P VP shunt   DM2 (diabetes mellitus, type 2) ([place])   GERD (gastroesophageal reflux disease)   Acute respiratory failure with hypoxia and hypercapnia (HCC)  Acute hypoxic/hypercapnic respiratory failure/acute metabolic encephalopathy, reason for admission  from unintentional narcotic overdose?, she was put on BiPAP, mental status improved, now ANO x3, hypoxic resolved  Asthma exacerbation?  -She had significant productive cough/severe wheezing initially on presentation, has significant leukocytosis on presentation -cxr with bronchitis changes  -she is allergic to steroids (reports steroid because psychosis) -She had significant improvement on doxycycline then augmentin, leukocytosis normalized, however sputum culture grew ESBL E. Coli, she did report recent nausea and vomiting  -Case discussed with infectious disease, who do not feel strongly about treating ESBL E. coli in sputum, think this is likely colonization, since she has improved on abx that did not cover ESBL -since she will be npo due to concerning for esophageal dysphagia, will keep on IV antibiotic for now, she likely will not need any antibiotic at discharge, by Monday she will be finishing 5 days of antibiotic treatment. -Wheezing resolved, hypoxia resolved, productive cough almost resolved  Narcotic overdose? Denies suicidal ideation Does has history depression/anxiety, sister reported patient's only child passed away a month ago, patient mother passed away earlier this year Inpatient psychiatry input appreciated   UDS appear collected but does not appear  to be processed  Serum drug screen in process   Delirium in the setting of acute illness versus steroid induced (family report she was given inhaled steroid for recent RSV infection prior to admission) -She was  significantly agitated with delusional thoughs initially on presentation , required sitter --h/o  seroquel intolerance in the past ---Inpatient psychiatry consulted and recommended haldol,  -She has much improved, she is pleasant, cooperative currently-sitter discontinued, decrease haldol dose,  -patient reports aldol helped calm her down and prefer to stay on while she is in the hospital  Leukocytosis Repeat  chest x-ray showed bronchitis   UA with rare bacteria, urine culture insignificant growth,  Blood culture no growth Sputum culture ESBL E. Coli, possible colonization after discussion with ID WBC normalized  Hypokalemia/hypomagnesemia, replace and normalized  Heart burn, esophageal dysphagia, n/v for the last two weeks On ppi,  Will keep npo after midnight, get DG esophagus , will need to call Gi Dr Benson Norway in am  11/27 addendum, later in the day patient report dark stool, FOBT ordered, monitor hemoglobin   Non-insulin-dependent type 2 diabetes Controlled, A1c 5.1 Home medication metformin 500 mg daily held, plan to resume at discharge Carb modified diet  Chronic pain Present with narcotic overdose  chronic pain medication held initially, patient desires to be taken off narcotics, patient already talked to her pcp and pain management Now she desires to restart back due to c/p chronic pain As needed Narcan for now  Hypothyroidism TSH 16 Free T4 1.13 Continue current dose of Synthroid   Hx idiopathic intracranial hypertension Follows with neurosurgery outpatient.  S/p VP shunt.  CT head with right frontal ventriculostomy catheter noted with tip in the right lateral ventricle with no mass or midline shift and ventricular size within normal limits. --Outpatient follow-up with neurosurgery  Unresulted Labs (From admission, onward)     Start     Ordered   02/06/21 0500  Creatinine, serum  (enoxaparin (LOVENOX)    CrCl >/= 30 ml/min)  Weekly,   R     Comments: while on  enoxaparin therapy    01/30/21 1811   02/04/21 6333  Basic metabolic panel  Tomorrow morning,   R       Question:  Specimen collection method  Answer:  Lab=Lab collect   02/03/21 1150   02/04/21 0500  Magnesium  Tomorrow morning,   STAT       Question:  Specimen collection method  Answer:  Lab=Lab collect   02/03/21 1150   02/01/21 0700  Lamotrigine level  Once,   R       Question:  Specimen collection method  Answer:  Lab=Lab collect   02/01/21 0659   01/31/21 1739  Drug Screen 10 W/Conf, Serum  Once,   R       Question:  Specimen collection method  Answer:  Lab=Lab collect   01/31/21 1738   01/30/21 1116  Rapid urine drug screen (hospital performed)  ONCE - STAT,   STAT        01/30/21 1116   01/30/21 1116  Urinalysis, Routine w reflex microscopic Urine, Clean Catch  Once,   STAT        01/30/21 1116              DVT prophylaxis: enoxaparin (LOVENOX) injection 40 mg Start: 01/30/21 2200   Code Status: Full Family Communication: Sister at bedside daily Disposition:   Status is: Inpatient   Dispo: The patient is from: Home  Anticipated d/c is to: Home              Anticipated d/c date is: Follow-up on DG esophagus ,needs GI Dr Benson Norway input for esophageal dysphagia                 Consultants:  Psychiatry Phone conversation with ID  Procedures:  bipap  Antimicrobials:   Anti-infectives (From admission, onward)    Start     Dose/Rate Route Frequency Ordered Stop   02/02/21 1600  meropenem (MERREM) 1 g in sodium chloride 0.9 % 100 mL IVPB        1 g 200 mL/hr over 30 Minutes Intravenous Every 8 hours 02/02/21 1448     02/02/21 1245  amoxicillin-clavulanate (AUGMENTIN) 875-125 MG per tablet 1 tablet  Status:  Discontinued        1 tablet Oral Every 12 hours 02/02/21 1154 02/02/21 1448   01/31/21 1215  doxycycline (VIBRA-TABS) tablet 100 mg  Status:  Discontinued        100 mg Oral Every 12 hours 01/31/21 1121 02/02/21 1154            Objective: Vitals:   02/02/21 1507 02/02/21 2007 02/02/21 2112 02/03/21 0434  BP: 121/72  132/75 122/66  Pulse: (!) 56  (!) 57 (!) 53  Resp: 16  16 16   Temp: 98.6 F (37 C)  98.9 F (37.2 C) 98.5 F (36.9 C)  TempSrc: Oral  Oral Oral  SpO2: 98% 93% 95% 97%  Weight:      Height:        Intake/Output Summary (Last 24 hours) at 02/03/2021 1205 Last data filed at 02/03/2021 0045 Gross per 24 hour  Intake 943.85 ml  Output --  Net 943.85 ml   Filed Weights   01/31/21 1924  Weight: 94 kg    Examination:  General exam: alert, awake, communicative, NAD, much improved  Respiratory system:  diffuse bilateral wheezing has almost resolved,  rhonchi has almost resolved, much improved aeration,  Respiratory effort normal.  Cardiovascular system:  RRR.  Gastrointestinal system: Abdomen is nondistended, soft and nontender.  Normal bowel sounds heard. Central nervous system: Alert and oriented. No focal neurological deficits. Extremities:  no edema Skin: No rashes, lesions or ulcers Psychiatry: aaox3, pleasant and cooperative     Data Reviewed: I have personally reviewed following labs and imaging studies  CBC: Recent Labs  Lab 01/30/21 1140 01/31/21 0429 02/01/21 0327 02/02/21 0358 02/03/21 0409  WBC 26.1* 19.7* 17.0* 10.8* 10.2  NEUTROABS 23.6*  --   --   --  7.0  HGB 13.9 13.3 11.4* 11.1* 11.3*  HCT 43.0 40.6 34.8* 33.2* 33.5*  MCV 91.9 91.4 90.2 89.7 89.1  PLT 268 274 205 188 599    Basic Metabolic Panel: Recent Labs  Lab 01/30/21 1140 01/31/21 0429 02/01/21 0327 02/02/21 0358 02/03/21 0409  NA 137 138 135 137 137  K 4.2 4.5 3.7 3.4* 4.0  CL 100 102 101 102 101  CO2 25 24 25 27 29   GLUCOSE 176* 91 83 83 101*  BUN 12 12 8 8 8   CREATININE 1.14* 0.74 0.64 0.52 0.52  CALCIUM 9.0 9.0 8.5* 8.4* 8.5*  MG  --   --  2.0  --  1.8  PHOS  --   --  2.5  --   --     GFR: Estimated Creatinine Clearance: 94.7 mL/min (by C-G formula based on SCr of 0.52  mg/dL).  Liver Function Tests: Recent Labs  Lab 01/30/21 1140 01/31/21 0429  AST 35 24  ALT 20 18  ALKPHOS 61 56  BILITOT 0.8 0.9  PROT 8.4* 7.9  ALBUMIN 5.1* 4.7    CBG: Recent Labs  Lab 02/02/21 1136 02/02/21 1642 02/02/21 2114 02/03/21 0724 02/03/21 1115  GLUCAP 104* 101* 114* 93 101*     Recent Results (from the past 240 hour(s))  Resp Panel by RT-PCR (Flu A&B, Covid) Nasopharyngeal Swab     Status: None   Collection Time: 01/30/21 12:12 PM   Specimen: Nasopharyngeal Swab; Nasopharyngeal(NP) swabs in vial transport medium  Result Value Ref Range Status   SARS Coronavirus 2 by RT PCR NEGATIVE NEGATIVE Final    Comment: (NOTE) SARS-CoV-2 target nucleic acids are NOT DETECTED.  The SARS-CoV-2 RNA is generally detectable in upper respiratory specimens during the acute phase of infection. The lowest concentration of SARS-CoV-2 viral copies this assay can detect is 138 copies/mL. A negative result does not preclude SARS-Cov-2 infection and should not be used as the sole basis for treatment or other patient management decisions. A negative result may occur with  improper specimen collection/handling, submission of specimen other than nasopharyngeal swab, presence of viral mutation(s) within the areas targeted by this assay, and inadequate number of viral copies(<138 copies/mL). A negative result must be combined with clinical observations, patient history, and epidemiological information. The expected result is Negative.  Fact Sheet for Patients:  EntrepreneurPulse.com.au  Fact Sheet for Healthcare Providers:  IncredibleEmployment.be  This test is no t yet approved or cleared by the [place] FDA and  has been authorized for detection and/or diagnosis of SARS-CoV-2 by FDA under an Emergency Use Authorization (EUA). This EUA will remain  in effect (meaning this test can be used) for the duration of the COVID-19 declaration  under Section 564(b)(1) of the Act, 21 U.S.C.section 360bbb-3(b)(1), unless the authorization is terminated  or revoked sooner.       Influenza A by PCR NEGATIVE NEGATIVE Final   Influenza B by PCR NEGATIVE NEGATIVE Final    Comment: (NOTE) The Xpert Xpress SARS-CoV-2/FLU/RSV plus assay is intended as an aid in the diagnosis of influenza from Nasopharyngeal swab specimens and should not be used as a sole basis for treatment. Nasal washings and aspirates are unacceptable for Xpert Xpress SARS-CoV-2/FLU/RSV testing.  Fact Sheet for Patients: EntrepreneurPulse.com.au  Fact Sheet for Healthcare Providers: IncredibleEmployment.be  This test is not yet approved or cleared by the [place] FDA and has been authorized for detection and/or diagnosis of SARS-CoV-2 by FDA under an Emergency Use Authorization (EUA). This EUA will remain in effect (meaning this test can be used) for the duration of the COVID-19 declaration under Section 564(b)(1) of the Act, 21 U.S.C. section 360bbb-3(b)(1), unless the authorization is terminated or revoked.  Performed at [facility], [place] [address], [place], [place] [address]   Expectorated Sputum Assessment w Gram Stain, Rflx to Resp Cult     Status: None   Collection Time: 01/31/21  8:50 AM   Specimen: Sputum  Result Value Ref Range Status   Specimen Description SPUTUM  Final   Special Requests NONE  Final   Sputum evaluation   Final    THIS SPECIMEN IS ACCEPTABLE FOR SPUTUM CULTURE Performed at [facility], [place] [address], [place], [place] [address]    Report Status 01/31/2021 FINAL  Final  Culture, Respiratory w Gram Stain     Status: None   Collection Time: 01/31/21  8:50 AM  Specimen: SPU  Result Value Ref Range Status   Specimen Description   Final    SPUTUM Performed at [facility] [address], [place], [place] [address]    Special  Requests   Final    NONE Reflexed from [phone] Performed at [facility], [place] [address] [place], [place] [address]    Gram Stain   Final    MODERATE WBC PRESENT,BOTH PMN AND MONONUCLEAR ABUNDANT GRAM NEGATIVE RODS FEW GRAM POSITIVE COCCI RARE GRAM POSITIVE RODS Performed at [facility], [place] [address] [place], [place] [address]    Culture   Final    ABUNDANT ESCHERICHIA COLI Confirmed Extended Spectrum Beta-Lactamase [name] (ESBL).  In bloodstream infections from ESBL organisms, carbapenems are preferred over piperacillin/tazobactam. They are shown to have a lower risk of mortality.    Report Status 02/02/2021 FINAL  Final   Organism ID, Bacteria ESCHERICHIA COLI  Final      Susceptibility   Escherichia coli - MIC*    AMPICILLIN >=32 RESISTANT Resistant     CEFAZOLIN >=64 RESISTANT Resistant     CEFEPIME 8 INTERMEDIATE Intermediate     CEFTAZIDIME RESISTANT Resistant     CEFTRIAXONE >=64 RESISTANT Resistant     CIPROFLOXACIN >=4 RESISTANT Resistant     GENTAMICIN <=1 SENSITIVE Sensitive     IMIPENEM <=0.25 SENSITIVE Sensitive     TRIMETH/SULFA >=320 RESISTANT Resistant     AMPICILLIN/SULBACTAM >=32 RESISTANT Resistant     PIP/TAZO 8 SENSITIVE Sensitive     * ABUNDANT ESCHERICHIA COLI  Urine Culture     Status: Abnormal   Collection Time: 01/31/21  8:54 AM   Specimen: Urine, Clean Catch  Result Value Ref Range Status   Specimen Description   Final    URINE, CLEAN CATCH Performed at [facility], [place] [address], [place], [place] [address]    Special Requests   Final    NONE Performed at [facility], [place] [address], [place], [place] [address]    Culture (A)  Final    <10,000 COLONIES/mL INSIGNIFICANT GROWTH Performed at [place] [address], [place], [place] [address]    Report Status 02/02/2021 FINAL  Final  Culture, blood (routine x 2)     Status: None (Preliminary result)   Collection  Time: 01/31/21 11:39 AM   Specimen: Left Antecubital; Blood  Result Value Ref Range Status   Specimen Description   Final    LEFT ANTECUBITAL Performed at [facility] [address], [place], [place] [address]    Special Requests   Final    BOTTLES DRAWN AEROBIC AND ANAEROBIC Blood Culture adequate volume Performed at [place] [address] [place], [place] [address]    Culture   Final    NO GROWTH 3 DAYS Performed at [facility], [place] [address] [place], [place] [address]    Report Status PENDING  Incomplete  Culture, blood (routine x 2)     Status: None (Preliminary result)   Collection Time: 01/31/21 11:50 AM   Specimen: BLOOD RIGHT FOREARM  Result Value Ref Range Status   Specimen Description   Final    BLOOD RIGHT FOREARM Performed at [facility] [address] [place], [place] [address]    Special Requests   Final    BOTTLES DRAWN AEROBIC AND ANAEROBIC Blood Culture adequate volume Performed at [facility] [address]  [place], [place] [address]    Culture   Final    NO GROWTH 3 DAYS Performed at [facility], [place] [address] [place], [place] [address]    Report Status PENDING  Incomplete         Radiology Studies: No results found.      Scheduled Meds:  bisacodyl  10 mg Rectal Daily   busPIRone  7.5 mg Oral BID   enoxaparin (LOVENOX) injection  40 mg Subcutaneous Q24H   famotidine  10 mg Oral BID   guaiFENesin  600 mg Oral BID   haloperidol  0.5 mg Oral BID   Or   haloperidol lactate  1 mg Intramuscular BID   insulin aspart  0-5 Units Subcutaneous QHS   insulin aspart  0-9 Units Subcutaneous TID WC   ipratropium  0.5 mg Nebulization BID   levalbuterol  0.63 mg Nebulization BID   levothyroxine  137 mcg Oral q morning   nystatin  5 mL Oral QID   pantoprazole  40 mg Oral Daily   senna-docusate  1 tablet Oral BID   Continuous Infusions:  sodium  chloride Stopped (02/03/21 0047)   meropenem (MERREM) IV 1 g (02/03/21 0812)      LOS: 4 days   Time spent: 57mins Greater than 50% of this time was spent in counseling, explanation of diagnosis, planning of further management, and coordination of care.   Voice Recognition Viviann Spare dictation system was used to create this note, attempts have been made to correct errors. Please contact the author with questions and/or clarifications.   Florencia Reasons, MD PhD FACP [facility]  Available via Epic secure chat 7am-7pm for nonurgent issues Please page for urgent issues To page the attending provider between 7A-7P or the covering provider during after hours 7P-7A, please log into the web site [email or web address] and access using universal [place] password for that web site. If you do not have the password, please call the hospital operator.    02/03/2021, 12:05 PM

## 2021-02-04 ENCOUNTER — Inpatient Hospital Stay (HOSPITAL_COMMUNITY): Payer: PPO

## 2021-02-04 DIAGNOSIS — T40601A Poisoning by unspecified narcotics, accidental (unintentional), initial encounter: Secondary | ICD-10-CM | POA: Diagnosis not present

## 2021-02-04 LAB — GLUCOSE, CAPILLARY
Glucose-Capillary: 90 mg/dL (ref 70–99)
Glucose-Capillary: 81 mg/dL (ref 70–99)
Glucose-Capillary: 86 mg/dL (ref 70–99)
Glucose-Capillary: 107 mg/dL — ABNORMAL HIGH (ref 70–99)

## 2021-02-04 LAB — BASIC METABOLIC PANEL
Anion gap: 5 (ref 5–15)
BUN: 7 mg/dL (ref 6–20)
CO2: 30 mmol/L (ref 22–32)
Calcium: 8.7 mg/dL — ABNORMAL LOW (ref 8.9–10.3)
Chloride: 101 mmol/L (ref 98–111)
Creatinine, Ser: 0.7 mg/dL (ref 0.44–1.00)
GFR, Estimated: >60 mL/min (ref >60)
Glucose, Bld: 100 mg/dL — ABNORMAL HIGH (ref 70–99)
Potassium: 3.9 mmol/L (ref 3.5–5.1)
Sodium: 136 mmol/L (ref 135–145)

## 2021-02-04 LAB — MAGNESIUM: Magnesium: 2.2 mg/dL (ref 1.7–2.4)

## 2021-02-04 LAB — LAMOTRIGINE LEVEL: Lamotrigine Lvl: <1 ug/mL — ABNORMAL LOW (ref 2.0–20.0)

## 2021-02-04 LAB — MRSA NEXT GEN BY PCR, NASAL: MRSA by PCR Next Gen: NOT DETECTED

## 2021-02-04 MED ORDER — IPRATROPIUM BROMIDE 0.02 % IN SOLN: 0.5000 mg | Freq: Four times a day (QID) | RESPIRATORY_TRACT | Status: DC | PRN

## 2021-02-04 MED ORDER — LEVALBUTEROL HCL 0.63 MG/3ML IN NEBU: 0.6300 mg | INHALATION_SOLUTION | Freq: Four times a day (QID) | RESPIRATORY_TRACT | Status: DC | PRN

## 2021-02-04 NOTE — Progress Notes (Signed)
PROGRESS NOTE    April Meyer  ZOX:[id number] DOB: [birth year] DOA: 01/30/2021 PCP: Hayden Rasmussen, MD   Chief Complain: Drug overdose  Brief Narrative:  Patient is a 55 y.o. female with medical history significant of Ehlers-Danlos syndrome, spinal cord AVM, idiopathic intracranial hypertension s/p VP shunt 2020, Arnold-Chari D formation s/p decompression, migraine headache, chronic pain, hypothyroidism, type 2 diabetes mellitus, GERD who presented to [facility] on 11/23 after being found down outside at home by neighbors.  EMS was activated, patient was noted to be hypothermic with pinpoint pupils and unresponsive and not breathing well in by EMS and was given intranasal Narcan and placed on a nonrebreather and transported to the ED for further evaluation.  There was complaint of intermittent nausea and vomiting for last 2 weeks.  GI consulted today for need of possible EGD.   Assessment & Plan:   Principal Problem:   Narcotic overdose ([place]) Active Problems:   History of Chiari malformation   Ehlers-Danlos syndrome, unspecified   Hypothyroid   S/P VP shunt   DM2 (diabetes mellitus, type 2) (HCC)   GERD (gastroesophageal reflux disease)   Acute respiratory failure with hypoxia and hypercapnia (HCC)   Acute hypoxic/hypercapnic respiratory failure/acute metabolic encephalopathy Main  reason for admission  from unintentional narcotic overdose?, she was put on BiPAP, mental status improved, now ANO x3, hypoxic resolved   Asthma exacerbation  -She had significant productive cough/severe wheezing initially on presentation, has significant leukocytosis on presentation -cxr with bronchitis changes  -she is allergic to steroids (reports steroid because psychosis) -She had significant improvement on doxycycline then augmentin, leukocytosis normalized, however sputum culture grew ESBL E. Coli, she did report recent nausea and vomiting  -Case discussed with infectious disease, who do not feel  strongly about treating ESBL E. coli in sputum, think this is likely colonization, since she has improved on abx that did not cover ESBL -since she will be npo due to concerning for esophageal dysphagia, will keep on IV antibiotic for now, she likely will not need any antibiotic at discharge, day 5 of abx,day 3 of meropenem -Wheezing resolved, hypoxia resolved, productive cough almost resolved   Narcotic overdose Denies suicidal ideation Does has history depression/anxiety, sister reported patient's only child passed away a month ago, patient mother passed away earlier this year Inpatient psychiatry input appreciated    Delirium in the setting of acute illness versus steroid induced  -She was significantly agitated with delusional thoughs initially on presentation , required sitter --h/o  seroquel intolerance in the past ---Inpatient psychiatry consulted and recommended haldol for 3 days  -She has much improved, she is pleasant, cooperative currently-sitter discontinued, decrease haldol dose, -patient reports aldol helped calm her down and prefer to stay on while she is in the hospital   Leukocytosis Repeat  chest x-ray showed bronchitis   UA with rare bacteria, urine culture insignificant growth,  Blood culture no growth Sputum culture ESBL E. Coli, possible colonization after discussion with ID WBC normalized   Heart burn, esophageal dysphagia, n/v for the last two weeks On ppi,   DG esophagus did not show any acute pathology GI consulted, Dr Collene Mares will see her    Non-insulin-dependent type 2 diabetes Controlled, A1c 5.1 Home medication metformin 500 mg daily held, plan to resume at discharge Carb modified diet   Chronic pain Present with narcotic overdose  chronic pain medication held initially, patient desires to be taken off narcotics, patient already talked to her pcp and pain management  Now she desires to restart back due to c/p chronic pain As needed Narcan for now    Hypothyroidism TSH 16 Free T4 1.13 Continue current dose of Synthroid    Hx idiopathic intracranial hypertension Follows with neurosurgery outpatient.  S/p VP shunt.  CT head with right frontal ventriculostomy catheter noted with tip in the right lateral ventricle with no mass or midline shift and ventricular size within normal limits. --Outpatient follow-up with neurosurgery         DVT prophylaxis:Lovenox Code Status: Full Family Communication: None at bedside Patient status:  Dispo: The patient is from: Home              Anticipated d/c is to: Home              Anticipated d/c date is: tomorrow after EGD  Consultants: GI  Procedures:None  Antimicrobials:  Anti-infectives (From admission, onward)    Start     Dose/Rate Route Frequency Ordered Stop   02/02/21 1600  meropenem (MERREM) 1 g in sodium chloride 0.9 % 100 mL IVPB        1 g 200 mL/hr over 30 Minutes Intravenous Every 8 hours 02/02/21 1448 02/05/21 0759   02/02/21 1245  amoxicillin-clavulanate (AUGMENTIN) 875-125 MG per tablet 1 tablet  Status:  Discontinued        1 tablet Oral Every 12 hours 02/02/21 1154 02/02/21 1448   01/31/21 1215  doxycycline (VIBRA-TABS) tablet 100 mg  Status:  Discontinued        100 mg Oral Every 12 hours 01/31/21 1121 02/02/21 1154       Subjective: Patient seen and examined at the bedside this morning.  Hemodynamically stable.   Still complaining of odynophagia and has nausea.  Denies any shortness of breath or cough  Objective: Vitals:   02/03/21 2039 02/04/21 0528 02/04/21 0845 02/04/21 1137  BP:  (!) 143/91  139/84  Pulse:  67  63  Resp:  18  18  Temp:  98.3 F (36.8 C)  98.4 F (36.9 C)  TempSrc:  Oral    SpO2: 97% 100% 99% 100%  Weight:      Height:        Intake/Output Summary (Last 24 hours) at 02/04/2021 1258 Last data filed at 02/04/2021 0600 Gross per 24 hour  Intake 560 ml  Output --  Net 560 ml   Filed Weights   01/31/21 1924  Weight: 94 kg     Examination:  General exam: Overall comfortable, not in distress HEENT: PERRL Respiratory system:  no wheezes or crackles  Cardiovascular system: S1 & S2 heard, RRR.  Gastrointestinal system: Abdomen is nondistended, soft and nontender. Central nervous system: Alert and oriented Extremities: No edema, no clubbing ,no cyanosis Skin: No rashes, no ulcers,no icterus      Data Reviewed: I have personally reviewed following labs and imaging studies  CBC: Recent Labs  Lab 01/30/21 1140 01/31/21 0429 02/01/21 0327 02/02/21 0358 02/03/21 0409  WBC 26.1* 19.7* 17.0* 10.8* 10.2  NEUTROABS 23.6*  --   --   --  7.0  HGB 13.9 13.3 11.4* 11.1* 11.3*  HCT 43.0 40.6 34.8* 33.2* 33.5*  MCV 91.9 91.4 90.2 89.7 89.1  PLT 268 274 205 188 440   Basic Metabolic Panel: Recent Labs  Lab 01/31/21 0429 02/01/21 0327 02/02/21 0358 02/03/21 0409 02/04/21 0420  NA 138 135 137 137 136  K 4.5 3.7 3.4* 4.0 3.9  CL 102 101 102 101 101  CO2 24  25 27 29 30   GLUCOSE 91 83 83 101* 100*  BUN 12 8 8 8 7   CREATININE 0.74 0.64 0.52 0.52 0.70  CALCIUM 9.0 8.5* 8.4* 8.5* 8.7*  MG  --  2.0  --  1.8 2.2  PHOS  --  2.5  --   --   --    GFR: Estimated Creatinine Clearance: 94.7 mL/min (by C-G formula based on SCr of 0.7 mg/dL). Liver Function Tests: Recent Labs  Lab 01/30/21 1140 01/31/21 0429  AST 35 24  ALT 20 18  ALKPHOS 61 56  BILITOT 0.8 0.9  PROT 8.4* 7.9  ALBUMIN 5.1* 4.7   No results for input(s): LIPASE, AMYLASE in the last 168 hours. No results for input(s): AMMONIA in the last 168 hours. Coagulation Profile: No results for input(s): INR, PROTIME in the last 168 hours. Cardiac Enzymes: No results for input(s): CKTOTAL, CKMB, CKMBINDEX, TROPONINI in the last 168 hours. BNP (last 3 results) No results for input(s): PROBNP in the last 8760 hours. HbA1C: No results for input(s): HGBA1C in the last 72 hours. CBG: Recent Labs  Lab 02/03/21 1115 02/03/21 1612 02/03/21 2033  02/04/21 0712 02/04/21 1138  GLUCAP 101* 91 108* 90 81   Lipid Profile: No results for input(s): CHOL, HDL, LDLCALC, TRIG, CHOLHDL, LDLDIRECT in the last 72 hours. Thyroid Function Tests: No results for input(s): TSH, T4TOTAL, FREET4, T3FREE, THYROIDAB in the last 72 hours. Anemia Panel: No results for input(s): VITAMINB12, FOLATE, FERRITIN, TIBC, IRON, RETICCTPCT in the last 72 hours. Sepsis Labs: No results for input(s): PROCALCITON, LATICACIDVEN in the last 168 hours.  Recent Results (from the past 240 hour(s))  Resp Panel by RT-PCR (Flu A&B, Covid) Nasopharyngeal Swab     Status: None   Collection Time: 01/30/21 12:12 PM   Specimen: Nasopharyngeal Swab; Nasopharyngeal(NP) swabs in vial transport medium  Result Value Ref Range Status   SARS Coronavirus 2 by RT PCR NEGATIVE NEGATIVE Final    Comment: (NOTE) SARS-CoV-2 target nucleic acids are NOT DETECTED.  The SARS-CoV-2 RNA is generally detectable in upper respiratory specimens during the acute phase of infection. The lowest concentration of SARS-CoV-2 viral copies this assay can detect is 138 copies/mL. A negative result does not preclude SARS-Cov-2 infection and should not be used as the sole basis for treatment or other patient management decisions. A negative result may occur with  improper specimen collection/handling, submission of specimen other than nasopharyngeal swab, presence of viral mutation(s) within the areas targeted by this assay, and inadequate number of viral copies(<138 copies/mL). A negative result must be combined with clinical observations, patient history, and epidemiological information. The expected result is Negative.  Fact Sheet for Patients:  EntrepreneurPulse.com.au  Fact Sheet for Healthcare Providers:  IncredibleEmployment.be  This test is no t yet approved or cleared by the [place] FDA and  has been authorized for detection and/or diagnosis of  SARS-CoV-2 by FDA under an Emergency Use Authorization (EUA). This EUA will remain  in effect (meaning this test can be used) for the duration of the COVID-19 declaration under Section 564(b)(1) of the Act, 21 U.S.C.section 360bbb-3(b)(1), unless the authorization is terminated  or revoked sooner.       Influenza A by PCR NEGATIVE NEGATIVE Final   Influenza B by PCR NEGATIVE NEGATIVE Final    Comment: (NOTE) The Xpert Xpress SARS-CoV-2/FLU/RSV plus assay is intended as an aid in the diagnosis of influenza from Nasopharyngeal swab specimens and should not be used as a sole basis for  treatment. Nasal washings and aspirates are unacceptable for Xpert Xpress SARS-CoV-2/FLU/RSV testing.  Fact Sheet for Patients: EntrepreneurPulse.com.au  Fact Sheet for Healthcare Providers: IncredibleEmployment.be  This test is not yet approved or cleared by the [place] FDA and has been authorized for detection and/or diagnosis of SARS-CoV-2 by FDA under an Emergency Use Authorization (EUA). This EUA will remain in effect (meaning this test can be used) for the duration of the COVID-19 declaration under Section 564(b)(1) of the Act, 21 U.S.C. section 360bbb-3(b)(1), unless the authorization is terminated or revoked.  Performed at [facility], [place] [address] [place], [place] [address]   Expectorated Sputum Assessment w Gram Stain, Rflx to Resp Cult     Status: None   Collection Time: 01/31/21  8:50 AM   Specimen: Sputum  Result Value Ref Range Status   Specimen Description SPUTUM  Final   Special Requests NONE  Final   Sputum evaluation   Final    THIS SPECIMEN IS ACCEPTABLE FOR SPUTUM CULTURE Performed at [facility], [place] [address] [place], [place] [address]    Report Status 01/31/2021 FINAL  Final  Culture, Respiratory w Gram Stain     Status: None   Collection Time: 01/31/21  8:50 AM   Specimen: SPU   Result Value Ref Range Status   Specimen Description   Final    SPUTUM Performed at [facility] [address], [place], [place] [address]    Special Requests   Final    NONE Reflexed from [id number] Performed at [facility], [place] [address], [place], [place] [address]    Gram Stain   Final    MODERATE WBC PRESENT,BOTH PMN AND MONONUCLEAR ABUNDANT GRAM NEGATIVE RODS FEW GRAM POSITIVE COCCI RARE GRAM POSITIVE RODS Performed at [facility], [place] [address] [place], [place] [address]    Culture   Final    ABUNDANT ESCHERICHIA COLI Confirmed Extended Spectrum Beta-Lactamase [name] (ESBL).  In bloodstream infections from ESBL organisms, carbapenems are preferred over piperacillin/tazobactam. They are shown to have a lower risk of mortality.    Report Status 02/02/2021 FINAL  Final   Organism ID, Bacteria ESCHERICHIA COLI  Final      Susceptibility   Escherichia coli - MIC*    AMPICILLIN >=32 RESISTANT Resistant     CEFAZOLIN >=64 RESISTANT Resistant     CEFEPIME 8 INTERMEDIATE Intermediate     CEFTAZIDIME RESISTANT Resistant     CEFTRIAXONE >=64 RESISTANT Resistant     CIPROFLOXACIN >=4 RESISTANT Resistant     GENTAMICIN <=1 SENSITIVE Sensitive     IMIPENEM <=0.25 SENSITIVE Sensitive     TRIMETH/SULFA >=320 RESISTANT Resistant     AMPICILLIN/SULBACTAM >=32 RESISTANT Resistant     PIP/TAZO 8 SENSITIVE Sensitive     * ABUNDANT ESCHERICHIA COLI  Urine Culture     Status: Abnormal   Collection Time: 01/31/21  8:54 AM   Specimen: Urine, Clean Catch  Result Value Ref Range Status   Specimen Description   Final    URINE, CLEAN CATCH Performed at [facility], [place] [address] [place], [place] [address]    Special Requests   Final    NONE Performed at [facility], [place] [address], [place], [place] [address]    Culture (A)  Final    <10,000 COLONIES/mL INSIGNIFICANT GROWTH Performed at [facility] [address], [place],  [address]    Report Status 02/02/2021  FINAL  Final  Culture, blood (routine x 2)     Status: None (Preliminary result)   Collection Time: 01/31/21 11:39 AM   Specimen: Left Antecubital; Blood  Result Value Ref Range Status   Specimen Description   Final    LEFT ANTECUBITAL Performed at [facility] [address], [place], [place] [address]    Special Requests   Final    BOTTLES DRAWN AEROBIC AND ANAEROBIC Blood Culture adequate volume Performed at [place] [address], [place], [place] [address]    Culture   Final    NO GROWTH 4 DAYS Performed at [facility], [place] [address] [place], [place] [address]    Report Status PENDING  Incomplete  Culture, blood (routine x 2)     Status: None (Preliminary result)   Collection Time: 01/31/21 11:50 AM   Specimen: BLOOD RIGHT FOREARM  Result Value Ref Range Status   Specimen Description   Final    BLOOD RIGHT FOREARM Performed at [place] [facility] [address], [place], [place] [address]    Special Requests   Final    BOTTLES DRAWN AEROBIC AND ANAEROBIC Blood Culture adequate volume Performed at [facility] [address], [place], [place] [address]    Culture   Final    NO GROWTH 4 DAYS Performed at [facility], [place] [address], [place], [place] [address]    Report Status PENDING  Incomplete         Radiology Studies: DG ESOPHAGUS W DOUBLE CM (HD)  Result Date: 02/04/2021 CLINICAL DATA:  Vomiting wall eating/drinking. Painful swallowing. Food sticking. EXAM: ESOPHOGRAM/BARIUM SWALLOW TECHNIQUE: Combined double contrast and single contrast examination performed using effervescent crystals, thick barium liquid, and thin barium liquid. FLUOROSCOPY TIME:  Fluoroscopy Time:  1 minute 12 seconds Radiation Exposure Index (if provided by the fluoroscopic device): 9.2 mGy Number of Acquired Spot  Images: 0 COMPARISON:  None. FINDINGS: Normal swallowing mechanism. No esophageal fold thickening, stricture or obstruction. Normal esophageal motility. A 13 mm barium tablet passed into the stomach without difficulty. IMPRESSION: No findings to explain the patient's clinical history. Electronically Signed   By: Lorin Picket M.D.   On: 02/04/2021 11:55        Scheduled Meds:  bisacodyl  10 mg Rectal Daily   busPIRone  7.5 mg Oral BID   enoxaparin (LOVENOX) injection  40 mg Subcutaneous Q24H   guaiFENesin  600 mg Oral BID   haloperidol  0.5 mg Oral BID   Or   haloperidol lactate  1 mg Intramuscular BID   insulin aspart  0-5 Units Subcutaneous QHS   insulin aspart  0-9 Units Subcutaneous TID WC   levothyroxine  137 mcg Oral q morning   nystatin  5 mL Oral QID   pantoprazole  40 mg Oral BID   senna-docusate  1 tablet Oral BID   Continuous Infusions:  sodium chloride Stopped (02/03/21 0047)   meropenem (MERREM) IV 1 g (02/04/21 1007)     LOS: 5 days    Time spent: 25 mins.More than 50% of that time was spent in counseling and/or coordination of care.      Shelly Coss, MD [facility] P11/28/2022, 12:58 PM

## 2021-02-04 NOTE — Plan of Care (Signed)

## 2021-02-04 NOTE — Consult Note (Signed)
Reason for Consult: Difficulty swallowing nausea and vomiting. Referring Physician: [facility] hospitalist.  April Meyer is an 55 y.o. female.  HPI: Lakasha is a 55 year old white female with multiple medical problems listed below, admitted to the hospital 01/30/2021 after he was found down by her neighbors and EMS was called as she was unresponsive. She was given Narcan and placed on a nonrebreather mask and brought to the emergency room for further evaluation. She thinks she may have overdosed on oxycodone which she takes 10 mg 5 times a day and is trying to decrease to 10 mg 3 times a day.  She claims she had some nausea and vomiting after she had taken Oxycodone and she felt that she had vomited her medication up and therefore took another dose of Oxycodone and was feeling queasy and nauseated and went outside to get some fresh air apparently this is when she collapsed and was found by her neighbor several hours later. The patient tells me she thinks this was late in the afternoon but her domestic partner tells me it was Wednesday morning 01/30/2021 when she was found outside her home. Patient claims she has been on narcotics for 13 years because of Erler's Danlos syndrome and chronic body aches.  She gives a history of acute retrosternal pain when swallowing along with nausea and vomiting predominantly after severe cough that she developed 3 weeks ago.  She claims she tested negative for COVID.  She been taking Dexilant at home for her reflux but and feels the pantoprazole given to her in the hospital does not help as much.  She believes her nausea and vomiting may be mechanical due to her severe coughing but the retrosternal pain and dysphagia is recent problem that seems to have started about 7 days ago.   Past Medical History:  Diagnosis Date   ADHD (attention deficit hyperactivity disorder)    no meds for   Anxiety    Arnold-Chiari deformity (HCC)    Asthma    AVM (arteriovenous malformation) spine  04/21/2018   Bruises easily    Cerebral venous sinus thrombosis, remote, resolved 03/28/2013   Chronic pain    Common migraine with intractable migraine 05/23/1759   Complication of anesthesia age 63    breast lumpectomy heart rate went way down in hospital stayed 2 days, no problems since   Complication of anesthesia    C 1 to C 3 fusion side to side and up and down limited    EDS (Ehlers-Danlos syndrome)    Eustachian tube dysfunction    Fatigue    GERD (gastroesophageal reflux disease)    Glioma (HCC)    low graded tectal plate glioma   Hemorrhoids    History of sleep apnea    no cpap used last 2 years, normal sleep study 2 yrs ago   HSV infection    Hypothyroid    Hypothyroidism    IIH (idiopathic intracranial hypertension)    Lumbar disc herniation    Migraine    Neck stiffness    Nystagmus, positional, central type 03/28/2013   Other malaise and fatigue 03/28/2013   " The patient reports feeling happy, ED, sore" on she has undergone at Chiari malformation surgery decompression in 2011. Prior to the surgery were chief complaints were weakness numbness and neck problems. She had swallowing difficulties and dizziness;  Classic Chiari presentation. But she didn't have headaches.   Photophobia    Pre-diabetes    Tinnitus of both ears    Urinary  incontinence    Vision disturbance    Vitamin D deficiency    Weakness    Wears glasses    Past Surgical History:  Procedure Laterality Date   ARNOLD CHIARI REPAIR  2010   Brain shunt  12/30/2019   vp shunt   BRAIN SURGERY     BREAST BIOPSY Left    lumpectomy age 49    BREAST EXCISIONAL BIOPSY Right yrs ago   CHOLECYSTECTOMY  age 77   laparoscopic   colonscopy  2012, 2018   DILATION AND CURETTAGE OF UTERUS  last done yrs ago   x 2 or 3   ENDOMETRIAL ABLATION  02/09/2013   [facility]   HYSTEROSCOPY WITH D & C N/A 06/26/2020   Procedure: DILATATION AND CURETTAGE /HYSTEROSCOPY;  Surgeon: Salvadore Dom, MD;  Location: [facility];  Service: Gynecology;  Laterality: N/A;   INTRAUTERINE DEVICE INSERTION  10/2010   Mirena   IUD REMOVAL  11/2010   could not tolerate hormonal side effects   metal plate removed from head  2015   screw came loose   neck fusion c1-c3  2010   OPERATIVE ULTRASOUND N/A 06/26/2020   Procedure: OPERATIVE ULTRASOUND;  Surgeon: Salvadore Dom, MD;  Location: [facility];  Service: Gynecology;  Laterality: N/A;   TONSILLECTOMY AND ADENOIDECTOMY  age 65   UPPER GI ENDOSCOPY  2012, 2018    Family History  Problem Relation Age of Onset   Aortic dissection Father    Diabetes Maternal Aunt    Breast cancer Maternal Aunt        40's   Diabetes Maternal Uncle    Breast cancer Paternal Aunt 21   Diabetes Maternal Grandmother    Cancer Maternal Grandmother        SKIN AND LUNG   Diabetes Paternal Grandmother    Breast cancer Maternal Aunt        50's   Breast cancer Maternal Aunt        50's   Social History:  reports that she quit smoking about 13 years ago. Her smoking use included cigarettes. She has a 42.00 pack-year smoking history. She has never used smokeless tobacco. She reports current drug use. Drug: Marijuana. She reports that she does not drink alcohol.  Allergies:  Allergies  Allergen Reactions   Other Anaphylaxis    Steroids and Zucchini    Prednisone Anaphylaxis   Morphine And Related Itching    Severe   Medications: I have reviewed the patient's current medications. Prior to Admission:  Medications Prior to Admission  Medication Sig Dispense Refill Last Dose   albuterol (VENTOLIN HFA) 108 (90 Base) MCG/ACT inhaler Inhale 1-2 puffs into the lungs every 4 (four) hours as needed for wheezing or shortness of breath.      benzonatate (TESSALON) 200 MG capsule Take 200 mg by mouth 3 (three) times daily as needed for cough.   01/28/2021   busPIRone (BUSPAR) 7.5 MG tablet Take 7.5 mg by mouth 2 (two) times daily.   01/29/2021    dexlansoprazole (DEXILANT) 60 MG capsule Take 60 mg by mouth daily as needed (acid reflux).   Past Week   ergocalciferol (VITAMIN D2) 1.25 MG (50000 UT) capsule Take 50,000 Units by mouth once a week.   Past Week   metFORMIN (GLUCOPHAGE-XR) 500 MG 24 hr tablet Take 500 mg by mouth daily.   01/30/2021   ondansetron (ZOFRAN) 4 MG tablet Take 1 tablet (4 mg total) by mouth  every 8 (eight) hours as needed for nausea or vomiting. 30 tablet 0 01/29/2021   Oxycodone HCl 10 MG TABS Take 10 mg by mouth every 4 (four) hours as needed (pain.).    01/30/2021   SYNTHROID 137 MCG tablet Take 137 mcg by mouth every morning.   01/30/2021   UNABLE TO FIND Take 1 tablet by mouth See admin instructions. Cbd gummies 1 in am Cbd sleep gummy at hs   01/29/2021   ADVAIR DISKUS 500-50 MCG/ACT AEPB Inhale 1 puff into the lungs 2 (two) times daily.   01/25/2021   azithromycin (ZITHROMAX) 250 MG tablet Take 250 mg by mouth See admin instructions. Zpack   01/27/2021   fluticasone (FLONASE) 50 MCG/ACT nasal spray Place into both nostrils. (Patient not taking: Reported on 01/30/2021)   Not Taking   promethazine (PHENERGAN) 25 MG tablet Take 1 tablet (25 mg total) by mouth every 6 (six) hours as needed for nausea. Nausea (Patient not taking: Reported on 01/30/2021) 30 tablet 1 Not Taking   valACYclovir (VALTREX) 1000 MG tablet TAKE 2 TABS (2 GRAMS) BY MOUTH NOW AND REPEAT IN 12 HOURS X 1, AS NEEDED FOR [place] (Patient not taking: Reported on 01/30/2021) 30 tablet 1 Not Taking    Results for orders placed or performed during the hospital encounter of 01/30/21 (from the past 48 hour(s))  Glucose, capillary     Status: Abnormal   Collection Time: 02/02/21  4:42 PM  Result Value Ref Range   Glucose-Capillary 101 (H) 70 - 99 mg/dL    Comment: Glucose reference range applies only to samples taken after fasting for at least 8 hours.  Glucose, capillary     Status: Abnormal   Collection Time: 02/02/21  9:14 PM  Result Value Ref  Range   Glucose-Capillary 114 (H) 70 - 99 mg/dL    Comment: Glucose reference range applies only to samples taken after fasting for at least 8 hours.  CBC with Differential/Platelet     Status: Abnormal   Collection Time: 02/03/21  4:09 AM  Result Value Ref Range   WBC 10.2 4.0 - 10.5 K/uL   RBC 3.76 (L) 3.87 - 5.11 MIL/uL   Hemoglobin 11.3 (L) 12.0 - 15.0 g/dL   HCT 33.5 (L) 36.0 - 46.0 %   MCV 89.1 80.0 - 100.0 fL   MCH 30.1 26.0 - 34.0 pg   MCHC 33.7 30.0 - 36.0 g/dL   RDW 13.0 11.5 - 15.5 %   Platelets 202 150 - 400 K/uL   nRBC 0.0 0.0 - 0.2 %   Neutrophils Relative % 66 %   Neutro Abs 7.0 1.7 - 7.7 K/uL   Lymphocytes Relative 21 %   Lymphs Abs 2.1 0.7 - 4.0 K/uL   Monocytes Relative 9 %   Monocytes Absolute 0.9 0.1 - 1.0 K/uL   Eosinophils Relative 2 %   Eosinophils Absolute 0.2 0.0 - 0.5 K/uL   Basophils Relative 1 %   Basophils Absolute 0.1 0.0 - 0.1 K/uL   Immature Granulocytes 1 %   Abs Immature Granulocytes 0.07 0.00 - 0.07 K/uL    Comment: Performed at [facility], [place] [address], [place], [place] [address]  Basic metabolic panel     Status: Abnormal   Collection Time: 02/03/21  4:09 AM  Result Value Ref Range   Sodium 137 135 - 145 mmol/L   Potassium 4.0 3.5 - 5.1 mmol/L   Chloride 101 98 - 111 mmol/L   CO2 29 22 -  32 mmol/L   Glucose, Bld 101 (H) 70 - 99 mg/dL    Comment: Glucose reference range applies only to samples taken after fasting for at least 8 hours.   BUN 8 6 - 20 mg/dL   Creatinine, Ser 0.52 0.44 - 1.00 mg/dL   Calcium 8.5 (L) 8.9 - 10.3 mg/dL   GFR, Estimated >60 >60 mL/min    Comment: (NOTE) Calculated using the CKD-EPI Creatinine Equation (2021)    Anion gap 7 5 - 15    Comment: Performed at [facility], [place] [address] [place], [place] [address]  Magnesium     Status: None   Collection Time: 02/03/21  4:09 AM  Result Value Ref Range   Magnesium 1.8 1.7 - 2.4 mg/dL    Comment: Performed at [facility], [place] [address], [place], [place] [address]  Glucose, capillary     Status: None   Collection Time: 02/03/21  7:24 AM  Result Value Ref Range   Glucose-Capillary 93 70 - 99 mg/dL    Comment: Glucose reference range applies only to samples taken after fasting for at least 8 hours.  Glucose, capillary     Status: Abnormal   Collection Time: 02/03/21 11:15 AM  Result Value Ref Range   Glucose-Capillary 101 (H) 70 - 99 mg/dL    Comment: Glucose reference range applies only to samples taken after fasting for at least 8 hours.  Glucose, capillary     Status: None   Collection Time: 02/03/21  4:12 PM  Result Value Ref Range   Glucose-Capillary 91 70 - 99 mg/dL    Comment: Glucose reference range applies only to samples taken after fasting for at least 8 hours.  Glucose, capillary     Status: Abnormal   Collection Time: 02/03/21  8:33 PM  Result Value Ref Range   Glucose-Capillary 108 (H) 70 - 99 mg/dL    Comment: Glucose reference range applies only to samples taken after fasting for at least 8 hours.  Basic metabolic panel     Status: Abnormal   Collection Time: 02/04/21  4:20 AM  Result Value Ref Range   Sodium 136 135 - 145 mmol/L   Potassium 3.9 3.5 - 5.1 mmol/L   Chloride 101 98 - 111 mmol/L   CO2 30 22 - 32 mmol/L   Glucose, Bld 100 (H) 70 - 99 mg/dL    Comment: Glucose reference range applies only to samples taken after fasting for at least 8 hours.   BUN 7 6 - 20 mg/dL   Creatinine, Ser 0.70 0.44 - 1.00 mg/dL   Calcium 8.7 (L) 8.9 - 10.3 mg/dL   GFR, Estimated >60 >60 mL/min    Comment: (NOTE) Calculated using the CKD-EPI Creatinine Equation (2021)    Anion gap 5 5 - 15    Comment: Performed at [facility], [place] [address], [place], [place] [address]  Magnesium     Status: None   Collection Time: 02/04/21  4:20 AM  Result Value Ref Range   Magnesium 2.2 1.7 - 2.4 mg/dL    Comment: Performed at [facility], [place] [address] [place],  [address]  Glucose, capillary     Status: None   Collection Time: 02/04/21  7:12 AM  Result Value Ref Range   Glucose-Capillary 90 70 - 99 mg/dL    Comment: Glucose reference range applies only to samples taken after fasting for at least 8 hours.  Glucose, capillary  Status: None   Collection Time: 02/04/21 11:38 AM  Result Value Ref Range   Glucose-Capillary 81 70 - 99 mg/dL    Comment: Glucose reference range applies only to samples taken after fasting for at least 8 hours.    DG ESOPHAGUS W DOUBLE CM (HD)  Result Date: 02/04/2021 CLINICAL DATA:  Vomiting wall eating/drinking. Painful swallowing. Food sticking. EXAM: ESOPHOGRAM/BARIUM SWALLOW TECHNIQUE: Combined double contrast and single contrast examination performed using effervescent crystals, thick barium liquid, and thin barium liquid. FLUOROSCOPY TIME:  Fluoroscopy Time:  1 minute 12 seconds Radiation Exposure Index (if provided by the fluoroscopic device): 9.2 mGy Number of Acquired Spot Images: 0 COMPARISON:  None. FINDINGS: Normal swallowing mechanism. No esophageal fold thickening, stricture or obstruction. Normal esophageal motility. A 13 mm barium tablet passed into the stomach without difficulty. IMPRESSION: No findings to explain the patient's clinical history. Electronically Signed   By: Lorin Picket M.D.   On: 02/04/2021 11:55    Review of Systems  Constitutional:  Positive for fatigue. Negative for activity change, appetite change, chills, diaphoresis, fever and unexpected weight change.  HENT:  Positive for trouble swallowing. Negative for mouth sores, nosebleeds and voice change.   Eyes:  Negative for photophobia, pain, discharge, redness, itching and visual disturbance.  Respiratory:  Positive for chest tightness. Negative for apnea, cough, choking, shortness of breath and stridor.   Cardiovascular: Negative.   Gastrointestinal:  Positive for nausea, rectal pain and vomiting. Negative  for abdominal distention, abdominal pain, anal bleeding, blood in stool, constipation and diarrhea.  Endocrine: Negative.   Genitourinary: Negative.   Musculoskeletal:  Positive for arthralgias and back pain. Negative for joint swelling.  Blood pressure 139/84, pulse 63, temperature 98.4 F (36.9 C), resp. rate 18, height 5\' 7"  (1.702 m), weight 94 kg, SpO2 100 %. Physical Exam Constitutional:      General: She is not in acute distress.    Appearance: She is obese. She is not toxic-appearing.  HENT:     Head: Normocephalic and atraumatic.     Mouth/Throat:     Mouth: Mucous membranes are moist.  Eyes:     Extraocular Movements: Extraocular movements intact.     Pupils: Pupils are equal, round, and reactive to light.  Cardiovascular:     Rate and Rhythm: Normal rate and regular rhythm.  Pulmonary:     Breath sounds: Wheezing present.  Abdominal:     General: Bowel sounds are normal.     Palpations: Abdomen is soft.  Musculoskeletal:     Cervical back: Normal range of motion and neck supple.  Neurological:     Mental Status: She is alert.   Assessment/Plan: 1) Nausea, vomiting, dysphagia/GERD-on Dexilant at home-will schedule patient for an EGD tomorrow.  2) acute hypoxic/hyper hypercapnic respiratory failure/metabolic encephalopathy secondary to  unintentional narcotic overdose-put on BiPAP mental status improved. 3) NIDDM. 4) Ehlers-Danlos syndrome. 5) Chronic pain on narcotics for 13 years. 6) Hypothyroidism. 7) History of idiopathic intracranial hypertension status post VP shunt placement. 8) Cough for 3 weeks with bronchitis on Chest x-ray-E.  9) ?Steroid allergy. 10) E. Coli--ESBL-sputum-likely colonization. Juanita Craver 02/04/2021, 12:46 PM

## 2021-02-04 NOTE — H&P (View-Only) (Signed)
Reason for Consult: Difficulty swallowing nausea and vomiting. Referring Physician: [facility] hospitalist.  April Meyer is an 55 y.o. female.  HPI: April Meyer is a 55 year old white female with multiple medical problems listed below, admitted to the hospital 01/30/2021 after he was found down by her neighbors and EMS was called as she was unresponsive. She was given Narcan and placed on a nonrebreather mask and brought to the emergency room for further evaluation. She thinks she may have overdosed on oxycodone which she takes 10 mg 5 times a day and is trying to decrease to 10 mg 3 times a day.  She claims she had some nausea and vomiting after she had taken Oxycodone and she felt that she had vomited her medication up and therefore took another dose of Oxycodone and was feeling queasy and nauseated and went outside to get some fresh air apparently this is when she collapsed and was found by her neighbor several hours later. The patient tells me she thinks this was late in the afternoon but her domestic partner tells me it was Wednesday morning 01/30/2021 when she was found outside her home. Patient claims she has been on narcotics for 13 years because of Erler's Danlos syndrome and chronic body aches.  She gives a history of acute retrosternal pain when swallowing along with nausea and vomiting predominantly after severe cough that she developed 3 weeks ago.  She claims she tested negative for COVID.  She been taking Dexilant at home for her reflux but and feels the pantoprazole given to her in the hospital does not help as much.  She believes her nausea and vomiting may be mechanical due to her severe coughing but the retrosternal pain and dysphagia is recent problem that seems to have started about 7 days ago.   Past Medical History:  Diagnosis Date   ADHD (attention deficit hyperactivity disorder)    no meds for   Anxiety    Arnold-Chiari deformity (HCC)    Asthma    AVM (arteriovenous malformation) spine  04/21/2018   Bruises easily    Cerebral venous sinus thrombosis, remote, resolved 03/28/2013   Chronic pain    Common migraine with intractable migraine 3/87/5643   Complication of anesthesia age 74    breast lumpectomy heart rate went way down in hospital stayed 2 days, no problems since   Complication of anesthesia    C 1 to C 3 fusion side to side and up and down limited    EDS (Ehlers-Danlos syndrome)    Eustachian tube dysfunction    Fatigue    GERD (gastroesophageal reflux disease)    Glioma (HCC)    low graded tectal plate glioma   Hemorrhoids    History of sleep apnea    no cpap used last 2 years, normal sleep study 2 yrs ago   HSV infection    Hypothyroid    Hypothyroidism    IIH (idiopathic intracranial hypertension)    Lumbar disc herniation    Migraine    Neck stiffness    Nystagmus, positional, central type 03/28/2013   Other malaise and fatigue 03/28/2013   " The patient reports feeling happy, ED, sore" on she has undergone at Chiari malformation surgery decompression in 2011. Prior to the surgery were chief complaints were weakness numbness and neck problems. She had swallowing difficulties and dizziness;  Classic Chiari presentation. But she didn't have headaches.   Photophobia    Pre-diabetes    Tinnitus of both ears    Urinary  incontinence    Vision disturbance    Vitamin D deficiency    Weakness    Wears glasses    Past Surgical History:  Procedure Laterality Date   ARNOLD CHIARI REPAIR  2010   Brain shunt  12/30/2019   vp shunt   BRAIN SURGERY     BREAST BIOPSY Left    lumpectomy age 35    BREAST EXCISIONAL BIOPSY Right yrs ago   CHOLECYSTECTOMY  age 60   laparoscopic   colonscopy  2012, 2018   DILATION AND CURETTAGE OF UTERUS  last done yrs ago   x 2 or 3   ENDOMETRIAL ABLATION  02/09/2013   [facility]   HYSTEROSCOPY WITH D & C N/A 06/26/2020   Procedure: DILATATION AND CURETTAGE /HYSTEROSCOPY;  Surgeon: Salvadore Dom, MD;  Location: [facility];  Service: Gynecology;  Laterality: N/A;   INTRAUTERINE DEVICE INSERTION  10/2010   Mirena   IUD REMOVAL  11/2010   could not tolerate hormonal side effects   metal plate removed from head  2015   screw came loose   neck fusion c1-c3  2010   OPERATIVE ULTRASOUND N/A 06/26/2020   Procedure: OPERATIVE ULTRASOUND;  Surgeon: Salvadore Dom, MD;  Location[facility];  Service: Gynecology;  Laterality: N/A;   TONSILLECTOMY AND ADENOIDECTOMY  age 89   UPPER GI ENDOSCOPY  2012, 2018    Family History  Problem Relation Age of Onset   Aortic dissection Father    Diabetes Maternal Aunt    Breast cancer Maternal Aunt        40's   Diabetes Maternal Uncle    Breast cancer Paternal Aunt 55   Diabetes Maternal Grandmother    Cancer Maternal Grandmother        SKIN AND LUNG   Diabetes Paternal Grandmother    Breast cancer Maternal Aunt        50's   Breast cancer Maternal Aunt        50's   Social History:  reports that she quit smoking about 13 years ago. Her smoking use included cigarettes. She has a 42.00 pack-year smoking history. She has never used smokeless tobacco. She reports current drug use. Drug: Marijuana. She reports that she does not drink alcohol.  Allergies:  Allergies  Allergen Reactions   Other Anaphylaxis    Steroids and Zucchini    Prednisone Anaphylaxis   Morphine And Related Itching    Severe   Medications: I have reviewed the patient's current medications. Prior to Admission:  Medications Prior to Admission  Medication Sig Dispense Refill Last Dose   albuterol (VENTOLIN HFA) 108 (90 Base) MCG/ACT inhaler Inhale 1-2 puffs into the lungs every 4 (four) hours as needed for wheezing or shortness of breath.      benzonatate (TESSALON) 200 MG capsule Take 200 mg by mouth 3 (three) times daily as needed for cough.   01/28/2021   busPIRone (BUSPAR) 7.5 MG tablet Take 7.5 mg by mouth 2 (two) times daily.   01/29/2021    dexlansoprazole (DEXILANT) 60 MG capsule Take 60 mg by mouth daily as needed (acid reflux).   Past Week   ergocalciferol (VITAMIN D2) 1.25 MG (50000 UT) capsule Take 50,000 Units by mouth once a week.   Past Week   metFORMIN (GLUCOPHAGE-XR) 500 MG 24 hr tablet Take 500 mg by mouth daily.   01/30/2021   ondansetron (ZOFRAN) 4 MG tablet Take 1 tablet (4 mg total) by mouth  every 8 (eight) hours as needed for nausea or vomiting. 30 tablet 0 01/29/2021   Oxycodone HCl 10 MG TABS Take 10 mg by mouth every 4 (four) hours as needed (pain.).    01/30/2021   SYNTHROID 137 MCG tablet Take 137 mcg by mouth every morning.   01/30/2021   UNABLE TO FIND Take 1 tablet by mouth See admin instructions. Cbd gummies 1 in am Cbd sleep gummy at hs   01/29/2021   ADVAIR DISKUS 500-50 MCG/ACT AEPB Inhale 1 puff into the lungs 2 (two) times daily.   01/25/2021   azithromycin (ZITHROMAX) 250 MG tablet Take 250 mg by mouth See admin instructions. Zpack   01/27/2021   fluticasone (FLONASE) 50 MCG/ACT nasal spray Place into both nostrils. (Patient not taking: Reported on 01/30/2021)   Not Taking   promethazine (PHENERGAN) 25 MG tablet Take 1 tablet (25 mg total) by mouth every 6 (six) hours as needed for nausea. Nausea (Patient not taking: Reported on 01/30/2021) 30 tablet 1 Not Taking   valACYclovir (VALTREX) 1000 MG tablet TAKE 2 TABS (2 GRAMS) BY MOUTH NOW AND REPEAT IN 12 HOURS X 1, AS NEEDED FOR [place] (Patient not taking: Reported on 01/30/2021) 30 tablet 1 Not Taking    Results for orders placed or performed during the hospital encounter of 01/30/21 (from the past 48 hour(s))  Glucose, capillary     Status: Abnormal   Collection Time: 02/02/21  4:42 PM  Result Value Ref Range   Glucose-Capillary 101 (H) 70 - 99 mg/dL    Comment: Glucose reference range applies only to samples taken after fasting for at least 8 hours.  Glucose, capillary     Status: Abnormal   Collection Time: 02/02/21  9:14 PM  Result Value Ref  Range   Glucose-Capillary 114 (H) 70 - 99 mg/dL    Comment: Glucose reference range applies only to samples taken after fasting for at least 8 hours.  CBC with Differential/Platelet     Status: Abnormal   Collection Time: 02/03/21  4:09 AM  Result Value Ref Range   WBC 10.2 4.0 - 10.5 K/uL   RBC 3.76 (L) 3.87 - 5.11 MIL/uL   Hemoglobin 11.3 (L) 12.0 - 15.0 g/dL   HCT 33.5 (L) 36.0 - 46.0 %   MCV 89.1 80.0 - 100.0 fL   MCH 30.1 26.0 - 34.0 pg   MCHC 33.7 30.0 - 36.0 g/dL   RDW 13.0 11.5 - 15.5 %   Platelets 202 150 - 400 K/uL   nRBC 0.0 0.0 - 0.2 %   Neutrophils Relative % 66 %   Neutro Abs 7.0 1.7 - 7.7 K/uL   Lymphocytes Relative 21 %   Lymphs Abs 2.1 0.7 - 4.0 K/uL   Monocytes Relative 9 %   Monocytes Absolute 0.9 0.1 - 1.0 K/uL   Eosinophils Relative 2 %   Eosinophils Absolute 0.2 0.0 - 0.5 K/uL   Basophils Relative 1 %   Basophils Absolute 0.1 0.0 - 0.1 K/uL   Immature Granulocytes 1 %   Abs Immature Granulocytes 0.07 0.00 - 0.07 K/uL    Comment: Performed at [facility], [place] [address] [place], [place] [address]  Basic metabolic panel     Status: Abnormal   Collection Time: 02/03/21  4:09 AM  Result Value Ref Range   Sodium 137 135 - 145 mmol/L   Potassium 4.0 3.5 - 5.1 mmol/L   Chloride 101 98 - 111 mmol/L   CO2 29 22 -  32 mmol/L   Glucose, Bld 101 (H) 70 - 99 mg/dL    Comment: Glucose reference range applies only to samples taken after fasting for at least 8 hours.   BUN 8 6 - 20 mg/dL   Creatinine, Ser 0.52 0.44 - 1.00 mg/dL   Calcium 8.5 (L) 8.9 - 10.3 mg/dL   GFR, Estimated >60 >60 mL/min    Comment: (NOTE) Calculated using the CKD-EPI Creatinine Equation (2021)    Anion gap 7 5 - 15    Comment: Performed at [facility], [place] [address] [place], [place] [address]  Magnesium     Status: None   Collection Time: 02/03/21  4:09 AM  Result Value Ref Range   Magnesium 1.8 1.7 - 2.4 mg/dL    Comment: Performed at [facility], [place] [address], [place], [place] [address]  Glucose, capillary     Status: None   Collection Time: 02/03/21  7:24 AM  Result Value Ref Range   Glucose-Capillary 93 70 - 99 mg/dL    Comment: Glucose reference range applies only to samples taken after fasting for at least 8 hours.  Glucose, capillary     Status: Abnormal   Collection Time: 02/03/21 11:15 AM  Result Value Ref Range   Glucose-Capillary 101 (H) 70 - 99 mg/dL    Comment: Glucose reference range applies only to samples taken after fasting for at least 8 hours.  Glucose, capillary     Status: None   Collection Time: 02/03/21  4:12 PM  Result Value Ref Range   Glucose-Capillary 91 70 - 99 mg/dL    Comment: Glucose reference range applies only to samples taken after fasting for at least 8 hours.  Glucose, capillary     Status: Abnormal   Collection Time: 02/03/21  8:33 PM  Result Value Ref Range   Glucose-Capillary 108 (H) 70 - 99 mg/dL    Comment: Glucose reference range applies only to samples taken after fasting for at least 8 hours.  Basic metabolic panel     Status: Abnormal   Collection Time: 02/04/21  4:20 AM  Result Value Ref Range   Sodium 136 135 - 145 mmol/L   Potassium 3.9 3.5 - 5.1 mmol/L   Chloride 101 98 - 111 mmol/L   CO2 30 22 - 32 mmol/L   Glucose, Bld 100 (H) 70 - 99 mg/dL    Comment: Glucose reference range applies only to samples taken after fasting for at least 8 hours.   BUN 7 6 - 20 mg/dL   Creatinine, Ser 0.70 0.44 - 1.00 mg/dL   Calcium 8.7 (L) 8.9 - 10.3 mg/dL   GFR, Estimated >60 >60 mL/min    Comment: (NOTE) Calculated using the CKD-EPI Creatinine Equation (2021)    Anion gap 5 5 - 15    Comment: Performed at [facility], [place] [address] [place], [place] [address]  Magnesium     Status: None   Collection Time: 02/04/21  4:20 AM  Result Value Ref Range   Magnesium 2.2 1.7 - 2.4 mg/dL    Comment: Performed at [facility], [place] [address] [place], [place] [address]  Glucose, capillary     Status: None   Collection Time: 02/04/21  7:12 AM  Result Value Ref Range   Glucose-Capillary 90 70 - 99 mg/dL    Comment: Glucose reference range applies only to samples taken after fasting for at least 8 hours.  Glucose, capillary  Status: None   Collection Time: 02/04/21 11:38 AM  Result Value Ref Range   Glucose-Capillary 81 70 - 99 mg/dL    Comment: Glucose reference range applies only to samples taken after fasting for at least 8 hours.    DG ESOPHAGUS W DOUBLE CM (HD)  Result Date: 02/04/2021 CLINICAL DATA:  Vomiting wall eating/drinking. Painful swallowing. Food sticking. EXAM: ESOPHOGRAM/BARIUM SWALLOW TECHNIQUE: Combined double contrast and single contrast examination performed using effervescent crystals, thick barium liquid, and thin barium liquid. FLUOROSCOPY TIME:  Fluoroscopy Time:  1 minute 12 seconds Radiation Exposure Index (if provided by the fluoroscopic device): 9.2 mGy Number of Acquired Spot Images: 0 COMPARISON:  None. FINDINGS: Normal swallowing mechanism. No esophageal fold thickening, stricture or obstruction. Normal esophageal motility. A 13 mm barium tablet passed into the stomach without difficulty. IMPRESSION: No findings to explain the patient's clinical history. Electronically Signed   By: Lorin Picket M.D.   On: 02/04/2021 11:55    Review of Systems  Constitutional:  Positive for fatigue. Negative for activity change, appetite change, chills, diaphoresis, fever and unexpected weight change.  HENT:  Positive for trouble swallowing. Negative for mouth sores, nosebleeds and voice change.   Eyes:  Negative for photophobia, pain, discharge, redness, itching and visual disturbance.  Respiratory:  Positive for chest tightness. Negative for apnea, cough, choking, shortness of breath and stridor.   Cardiovascular: Negative.   Gastrointestinal:  Positive for nausea, rectal pain and vomiting. Negative  for abdominal distention, abdominal pain, anal bleeding, blood in stool, constipation and diarrhea.  Endocrine: Negative.   Genitourinary: Negative.   Musculoskeletal:  Positive for arthralgias and back pain. Negative for joint swelling.  Blood pressure 139/84, pulse 63, temperature 98.4 F (36.9 C), resp. rate 18, height 5\' 7"  (1.702 m), weight 94 kg, SpO2 100 %. Physical Exam Constitutional:      General: She is not in acute distress.    Appearance: She is obese. She is not toxic-appearing.  HENT:     Head: Normocephalic and atraumatic.     Mouth/Throat:     Mouth: Mucous membranes are moist.  Eyes:     Extraocular Movements: Extraocular movements intact.     Pupils: Pupils are equal, round, and reactive to light.  Cardiovascular:     Rate and Rhythm: Normal rate and regular rhythm.  Pulmonary:     Breath sounds: Wheezing present.  Abdominal:     General: Bowel sounds are normal.     Palpations: Abdomen is soft.  Musculoskeletal:     Cervical back: Normal range of motion and neck supple.  Neurological:     Mental Status: She is alert.   Assessment/Plan: 1) Nausea, vomiting, dysphagia/GERD-on Dexilant at home-will schedule patient for an EGD tomorrow.  2) acute hypoxic/hyper hypercapnic respiratory failure/metabolic encephalopathy secondary to  unintentional narcotic overdose-put on BiPAP mental status improved. 3) NIDDM. 4) Ehlers-Danlos syndrome. 5) Chronic pain on narcotics for 13 years. 6) Hypothyroidism. 7) History of idiopathic intracranial hypertension status post VP shunt placement. 8) Cough for 3 weeks with bronchitis on Chest x-ray-E.  9) ?Steroid allergy. 10) E. Coli--ESBL-sputum-likely colonization. Juanita Craver 02/04/2021, 12:46 PM

## 2021-02-04 NOTE — Care Management Important Message (Signed)
Important Message  Patient Details IM Letter placed in Patients room. Name: April Meyer MRN: [id number] Date of Birth: 1965/09/09   Medicare Important Message Given:  Yes     Kerin Salen 02/04/2021, 11:50 AM

## 2021-02-05 ENCOUNTER — Encounter (HOSPITAL_COMMUNITY)
Admission: EM | Disposition: A | Discharge status: Home / Self Care | Payer: Self-pay | Source: Home / Self Care | Attending: Internal Medicine

## 2021-02-05 ENCOUNTER — Inpatient Hospital Stay (HOSPITAL_COMMUNITY): Payer: PPO | Admitting: Certified Registered"

## 2021-02-05 ENCOUNTER — Encounter (HOSPITAL_COMMUNITY): Payer: Self-pay | Admitting: Internal Medicine

## 2021-02-05 DIAGNOSIS — T40601A Poisoning by unspecified narcotics, accidental (unintentional), initial encounter: Secondary | ICD-10-CM | POA: Diagnosis not present

## 2021-02-05 HISTORY — PX: ESOPHAGOGASTRODUODENOSCOPY: SHX5428

## 2021-02-05 LAB — CULTURE, BLOOD (ROUTINE X 2)
Culture: NO GROWTH
Culture: NO GROWTH
Special Requests: ADEQUATE
Special Requests: ADEQUATE

## 2021-02-05 LAB — GLUCOSE, CAPILLARY
Glucose-Capillary: 92 mg/dL (ref 70–99)
Glucose-Capillary: 87 mg/dL (ref 70–99)

## 2021-02-05 SURGERY — EGD (ESOPHAGOGASTRODUODENOSCOPY)
Anesthesia: Monitor Anesthesia Care

## 2021-02-05 SURGERY — ESOPHAGOGASTRODUODENOSCOPY (EGD) WITH PROPOFOL
Anesthesia: Monitor Anesthesia Care

## 2021-02-05 MED ORDER — SODIUM CHLORIDE 0.9 % IV SOLN: INTRAVENOUS | Status: DC

## 2021-02-05 MED ORDER — PANTOPRAZOLE SODIUM 40 MG PO TBEC: 40.0000 mg | DELAYED_RELEASE_TABLET | Freq: Two times a day (BID) | ORAL | 1 refills | Status: DC

## 2021-02-05 MED ORDER — LIDOCAINE HCL (CARDIAC) PF 100 MG/5ML IV SOSY
PREFILLED_SYRINGE | INTRAVENOUS | Status: DC | PRN
  Administered 2021-02-05: 100 mg via INTRAVENOUS

## 2021-02-05 MED ORDER — PROPOFOL 500 MG/50ML IV EMUL
INTRAVENOUS | Status: DC | PRN
Start: 2021-02-05 — End: 2021-02-05
  Administered 2021-02-05: 150 ug/kg/min via INTRAVENOUS

## 2021-02-05 MED ORDER — PROPOFOL 10 MG/ML IV BOLUS
INTRAVENOUS | Status: DC | PRN
  Administered 2021-02-05: 40 mg via INTRAVENOUS

## 2021-02-05 MED ORDER — LACTATED RINGERS IV SOLN: INTRAVENOUS | Status: DC

## 2021-02-05 NOTE — Op Note (Signed)
[facility] Patient Name: April Meyer Procedure Date: 02/05/2021 MRN: [id number] Attending MD: Carol Ada , MD Date of Birth: 1965/07/13 CSN: [id number] Age: 55 Admit Type: Inpatient Procedure:                Upper GI endoscopy Indications:              Dysphagia, Nausea with vomiting Providers:                Carol Ada, MD, Particia Nearing, RN, Luan Moore, Technician, Stephanie [place] CRNA Referring MD:              Medicines:                Propofol per Anesthesia Complications:            No immediate complications. Estimated Blood Loss:     Estimated blood loss: none. Procedure:                Pre-Anesthesia Assessment:                           - Prior to the procedure, a History and Physical                            was performed, and patient medications and                            allergies were reviewed. The patient's tolerance of                            previous anesthesia was also reviewed. The risks                            and benefits of the procedure and the sedation                            options and risks were discussed with the patient.                            All questions were answered, and informed consent                            was obtained. Prior Anticoagulants: The patient has                            taken no previous anticoagulant or antiplatelet                            agents. ASA Grade Assessment: III - A patient with                            severe systemic disease. After reviewing the risks  and benefits, the patient was deemed in                            satisfactory condition to undergo the procedure.                           - Sedation was administered by an anesthesia                            professional. Deep sedation was attained.                           After obtaining informed consent, the endoscope was                            passed  under direct vision. Throughout the                            procedure, the patient's blood pressure, pulse, and                            oxygen saturations were monitored continuously. The                            [id number]-[id number] ([id number]) Olympus endoscope was introduced                            through the mouth, and advanced to the second part                            of duodenum. The upper GI endoscopy was                            accomplished without difficulty. The patient                            tolerated the procedure well. Scope In: Scope Out: Findings:      LA Grade D (one or more mucosal breaks involving at least 75% of       esophageal circumference) esophagitis with no bleeding was found in the       middle and lower third of the esophagus.      A 3 cm hiatal hernia was present.      The stomach was normal.      The examined duodenum was normal. Impression:               - LA Grade D reflux esophagitis with no bleeding.                           - 3 cm hiatal hernia.                           - Normal stomach.                           - Normal  examined duodenum.                           - No specimens collected. Moderate Sedation:      Not Applicable - Patient had care per Anesthesia. Recommendation:           - Patient has a contact number available for                            emergencies. The signs and symptoms of potential                            delayed complications were discussed with the                            patient. Return to normal activities tomorrow.                            Written discharge instructions were provided to the                            patient.                           - Resume previous diet.                           - Continue present medications.                           - Continue with pantoprazole BID. Procedure Code(s):        --- Professional ---                           [phone], Esophagogastroduodenoscopy,  flexible,                            transoral; diagnostic, including collection of                            specimen(s) by brushing or washing, when performed                            (separate procedure) Diagnosis Code(s):        --- Professional ---                           K21.00, Gastro-esophageal reflux disease with                            esophagitis, without bleeding                           K44.9, Diaphragmatic hernia without obstruction or                            gangrene  R13.10, Dysphagia, unspecified                           R11.2, Nausea with vomiting, unspecified CPT copyright 2019 American Medical Association. All rights reserved. The codes documented in this report are preliminary and upon coder review may  be revised to meet current compliance requirements. Carol Ada, MD Carol Ada, MD 02/05/2021 2:24:02 PM This report has been signed electronically. Number of Addenda: 0

## 2021-02-05 NOTE — Transfer of Care (Signed)
Immediate Anesthesia Transfer of Care Note  Patient: Christne B Quiles  Procedure(s) Performed: ESOPHAGOGASTRODUODENOSCOPY (EGD)  Patient Location: PACU  Anesthesia Type:MAC  Level of Consciousness: awake, alert  and oriented  Airway & Oxygen Therapy: Patient Spontanous Breathing and Patient connected to face mask oxygen  Post-op Assessment: Report given to RN and Post -op Vital signs reviewed and stable  Post vital signs: Reviewed and stable  Last Vitals:  Vitals Value Taken Time  BP 123/83 02/05/21 1422  Temp    Pulse 66 02/05/21 1424  Resp 14 02/05/21 1424  SpO2 98 % 02/05/21 1424  Vitals shown include unvalidated device data.  Last Pain:  Vitals:   02/05/21 1422  TempSrc:   PainSc: 0-No pain      Patients Stated Pain Goal: 3 (15/05/69 7948)  Complications: No notable events documented.

## 2021-02-05 NOTE — Interval H&P Note (Signed)
History and Physical Interval Note:  02/05/2021 1:29 PM  April Meyer  has presented today for surgery, with the diagnosis of n/v/gerd/dysphagia.  The various methods of treatment have been discussed with the patient and family. After consideration of risks, benefits and other options for treatment, the patient has consented to  Procedure(s): ESOPHAGOGASTRODUODENOSCOPY (EGD) (N/A) as a surgical intervention.  The patient's history has been reviewed, patient examined, no change in status, stable for surgery.  I have reviewed the patient's chart and labs.  Questions were answered to the patient's satisfaction.     Kaden Dunkel D

## 2021-02-05 NOTE — Anesthesia Postprocedure Evaluation (Signed)
Anesthesia Post Note  Patient: April Meyer  Procedure(s) Performed: ESOPHAGOGASTRODUODENOSCOPY (EGD)     Patient location during evaluation: Endoscopy Anesthesia Type: MAC Level of consciousness: awake Pain management: pain level controlled Vital Signs Assessment: post-procedure vital signs reviewed and stable Respiratory status: spontaneous breathing Postop Assessment: no apparent nausea or vomiting Anesthetic complications: no   No notable events documented.  Last Vitals:  Vitals:   02/05/21 1311 02/05/21 1422  BP: 136/75 123/83  Pulse: 61 95  Resp: 17 (!) 23  Temp: 37.1 C 36.6 C  SpO2: 99% 98%    Last Pain:  Vitals:   02/05/21 1422  TempSrc: Oral  PainSc: 0-No pain                 Rylea Selway

## 2021-02-05 NOTE — Anesthesia Preprocedure Evaluation (Addendum)
Anesthesia Evaluation  Patient identified by MRN, date of birth, ID band Patient awake    Reviewed: Allergy & Precautions, NPO status , Patient's Chart, lab work & pertinent test results  Airway Mallampati: II  TM Distance: >3 FB     Dental   Pulmonary asthma , COPD, former smoker,    breath sounds clear to auscultation       Cardiovascular  Rhythm:Regular Rate:Normal     Neuro/Psych  Headaches, Anxiety    GI/Hepatic Neg liver ROS, GERD  ,  Endo/Other  diabetesHypothyroidism   Renal/GU      Musculoskeletal   Abdominal   Peds  Hematology   Anesthesia Other Findings   Reproductive/Obstetrics                            Anesthesia Physical Anesthesia Plan  ASA: 3  Anesthesia Plan: MAC   Post-op Pain Management:    Induction:   PONV Risk Score and Plan: 2 and Ondansetron, Propofol infusion and Midazolam  Airway Management Planned:   Additional Equipment:   Intra-op Plan:   Post-operative Plan:   Informed Consent: I have reviewed the patients History and Physical, chart, labs and discussed the procedure including the risks, benefits and alternatives for the proposed anesthesia with the patient or authorized representative who has indicated his/her understanding and acceptance.     Dental advisory given  Plan Discussed with: CRNA and Anesthesiologist  Anesthesia Plan Comments:         Anesthesia Quick Evaluation

## 2021-02-05 NOTE — Discharge Summary (Signed)
Physician Discharge Summary  April Meyer JJK:[id number] DOB: [birth year] DOA: 01/30/2021  PCP: Hayden Rasmussen, MD  Admit date: 01/30/2021 Discharge date: 02/05/2021  Admitted From: Home Disposition:  Home  Discharge Condition:Stable CODE STATUS:FULL Diet recommendation:  Regular   Brief/Interim Summary:  Patient is a 55 y.o. female with medical history significant of Ehlers-Danlos syndrome, spinal cord AVM, idiopathic intracranial hypertension s/p VP shunt 2020, Arnold-Chari D formation s/p decompression, migraine headache, chronic pain, hypothyroidism, type 2 diabetes mellitus, GERD who presented to [facility] on 11/23 after being found down outside at home by neighbors.  EMS was activated, patient was noted to be hypothermic with pinpoint pupils and unresponsive and not breathing well in by EMS and was given intranasal Narcan and placed on a nonrebreather and transported to the ED for further evaluation.  On presentation she had to be put on BiPAP.  Hypoxia gradually improved and her respiratory status normalized.  Hospital course also remarkable for persistent nausea and vomiting.  Underwent EGD today with finding of esophagitis.  GI recommended PPI twice daily.  She is medically stable for discharge today.  Following problems were addressed during her hospitalization:  Acute hypoxic/hypercapnic respiratory failure/acute metabolic encephalopathy Main  reason for admission  from unintentional narcotic overdose?, she was put on BiPAP, mental status improved, now ANO x3, hypoxic resolved   Asthma exacerbation  -She had significant productive cough/severe wheezing initially on presentation, has significant leukocytosis on presentation -cxr with bronchitis changes  -she is allergic to steroids (reports steroid because psychosis) -She had significant improvement on doxycycline then augmentin, leukocytosis normalized, however sputum culture grew ESBL E. Coli, she did report recent nausea and  vomiting  -Case discussed with infectious disease, who do not feel strongly about treating ESBL E. coli in sputum, think this is likely colonization,however she was treated with meropenem. -Wheezing resolved, hypoxia resolved, productive cough  resolved   Narcotic overdose Denies suicidal ideation Does has history depression/anxiety, sister reported patient's only child passed away a month ago, patient mother passed away earlier this year Inpatient psychiatry done, recommend outpatient follow-up   Delirium in the setting of acute illness versus steroid induced  -She was significantly agitated with delusional thoughs initially on presentation , required sitter --h/o  seroquel intolerance in the past ---Inpatient psychiatry consulted and recommended haldol for 3 days  -She has much improved, she is pleasant, cooperative currently-sitter discontinued   Leukocytosis Repeat  chest x-ray showed bronchitis   UA with rare bacteria, urine culture insignificant growth,  Blood culture no growth Sputum culture ESBL E. Coli, possible colonization after discussion with ID WBC normalized   Heart burn, esophageal dysphagia, n/v for the last two weeks  DG esophagus did not show any acute pathology Underwent EGD with finding of esophagitis,GI recommended PPI twice daily    Non-insulin-dependent type 2 diabetes Controlled, A1c 5.1 Home medication metformin 500 mg daily to be resumed on DC   Chronic pain Present with narcotic overdose  chronic pain medication held initially, patient desires to be taken off narcotics, patient already talked to her pcp and pain management Now she desires to restart back due to c/p chronic pain   Hypothyroidism TSH 16 Free T4 1.13 Continue current dose of Synthroid    Hx idiopathic intracranial hypertension Follows with neurosurgery outpatient.  S/p VP shunt.  CT head with right frontal ventriculostomy catheter noted with tip in the right lateral ventricle with no  mass or midline shift and ventricular size within normal limits. --Outpatient follow-up  with neurosurgery  Discharge Diagnoses:  Principal Problem:   Narcotic overdose ([place]) Active Problems:   History of Chiari malformation   Ehlers-Danlos syndrome, unspecified   Hypothyroid   S/P VP shunt   DM2 (diabetes mellitus, type 2) (HCC)   GERD (gastroesophageal reflux disease)   Acute respiratory failure with hypoxia and hypercapnia [facility])    Discharge Instructions  Discharge Instructions     Diet general   Complete by: As directed    Discharge instructions   Complete by: As directed    1)Please take prescribed medication as instructed. 2)Follow up with psychiatry as an outpatient. 3)Follow up with your PCP in a week   Increase activity slowly   Complete by: As directed       Allergies as of 02/05/2021       Reactions   Other Anaphylaxis   Steroids and Zucchini    Prednisone Anaphylaxis   Morphine And Related Itching   Severe        Medication List     STOP taking these medications    azithromycin 250 MG tablet Commonly known as: ZITHROMAX   Dexilant 60 MG capsule Generic drug: dexlansoprazole Replaced by: pantoprazole 40 MG tablet       TAKE these medications    Advair Diskus 500-50 MCG/ACT Aepb Generic drug: fluticasone-salmeterol Inhale 1 puff into the lungs 2 (two) times daily.   albuterol 108 (90 Base) MCG/ACT inhaler Commonly known as: VENTOLIN HFA Inhale 1-2 puffs into the lungs every 4 (four) hours as needed for wheezing or shortness of breath.   benzonatate 200 MG capsule Commonly known as: TESSALON Take 200 mg by mouth 3 (three) times daily as needed for cough.   busPIRone 7.5 MG tablet Commonly known as: BUSPAR Take 7.5 mg by mouth 2 (two) times daily.   ergocalciferol 1.25 MG (50000 UT) capsule Commonly known as: VITAMIN D2 Take 50,000 Units by mouth once a week.   fluticasone 50 MCG/ACT nasal spray Commonly known as:  FLONASE Place into both nostrils.   metFORMIN 500 MG 24 hr tablet Commonly known as: GLUCOPHAGE-XR Take 500 mg by mouth daily.   ondansetron 4 MG tablet Commonly known as: Zofran Take 1 tablet (4 mg total) by mouth every 8 (eight) hours as needed for nausea or vomiting.   Oxycodone HCl 10 MG Tabs Take 10 mg by mouth every 4 (four) hours as needed (pain.).   pantoprazole 40 MG tablet Commonly known as: PROTONIX Take 1 tablet (40 mg total) by mouth 2 (two) times daily. Replaces: Dexilant 60 MG capsule   promethazine 25 MG tablet Commonly known as: PHENERGAN Take 1 tablet (25 mg total) by mouth every 6 (six) hours as needed for nausea. Nausea   Synthroid 137 MCG tablet Generic drug: levothyroxine Take 137 mcg by mouth every morning.   UNABLE TO FIND Take 1 tablet by mouth See admin instructions. Cbd gummies 1 in am Cbd sleep gummy at hs   valACYclovir 1000 MG tablet Commonly known as: VALTREX TAKE 2 TABS (2 GRAMS) BY MOUTH NOW AND REPEAT IN 12 HOURS X 1, AS NEEDED FOR [place]        Follow-up Information     Hayden Rasmussen, MD Follow up on 02/05/2021.   Specialty: Family Medicine Why: hospital discharge follow up, consider refer to a pulmonologist/allergist for asthma , she has h/o steroids allergy Contact information: [address] [place] [address] [phone]         BEHAVIORAL HEALTH  INTENSIVE PSYCH. Call in 1 day(s).   Specialty: Behavioral Health Why: Medications and Intensive outpatient programming (IOP). IOP is increase in therapy service, more comphrensive and includes sheeing a psychiatric provider. Contact information: [place] [phone] [place] [phone]        Group, [facility] up.   Specialty: Behavioral Health Why: Cognitive behavioral therapy Contact information: [place] [address] [phone]                 Allergies  Allergen Reactions   Other Anaphylaxis    Steroids and Zucchini    Prednisone Anaphylaxis   Morphine And Related Itching    Severe    Consultations: Psychiatry   Procedures/Studies: DG Chest 2 View  Result Date: 01/31/2021 CLINICAL DATA:  55 year old female with cough. EXAM: CHEST - 2 VIEW COMPARISON:  01/30/2021 and prior studies FINDINGS: The cardiomediastinal silhouette is unchanged. Peribronchial thickening is present. There is no evidence of focal airspace disease, pulmonary edema, suspicious pulmonary nodule/mass, pleural effusion, or pneumothorax. No acute bony abnormalities are identified. LEFT 5th rib deformity again identified. IMPRESSION: Peribronchial thickening without evidence of focal pneumonia. Electronically Signed   By: Margarette Canada M.D.   On: 01/31/2021 13:49   CT Head Wo Contrast  Result Date: 01/30/2021 CLINICAL DATA:  Altered mental status. EXAM: CT HEAD WITHOUT CONTRAST TECHNIQUE: Contiguous axial images were obtained from the base of the skull through the vertex without intravenous contrast. COMPARISON:  June 26, 2017. FINDINGS: Brain: Right frontal ventriculostomy catheter is noted with tip in right lateral ventricle. No mass effect or midline shift is noted. Ventricular size is within normal limits. There is no evidence of mass lesion, hemorrhage or acute infarction. Vascular: No hyperdense vessel or unexpected calcification. Skull: Right frontal ventriculostomy is noted. No definite fracture is noted. Sinuses/Orbits: No acute finding. Other: None. IMPRESSION: No acute intracranial abnormality seen. Electronically Signed   By: Marijo Conception M.D.   On: 01/30/2021 14:07   DG Chest Port 1 View  Result Date: 01/30/2021 CLINICAL DATA:  Patient found down, unresponsive with pinpoint pupils. Hypothermia. EXAM: PORTABLE CHEST 1 VIEW COMPARISON:  No prior chest radiographs available. Abdominal CT 01/03/2019. FINDINGS: 1409 hours. The heart size is at the  upper limits of normal for portable semi erect technique. Ventricular peritoneal shunt overlies the right chest. The lungs appear clear. There is no pleural effusion or pneumothorax. There is a defect in the left 5th rib posteriorly which may be postsurgical. No acute osseous findings are evident. Telemetry leads overlie the chest. IMPRESSION: No evidence of active cardiopulmonary process. Left 5th rib deformity, likely postsurgical. Electronically Signed   By: Richardean Sale M.D.   On: 01/30/2021 14:14   DG ESOPHAGUS W DOUBLE CM (HD)  Result Date: 02/04/2021 CLINICAL DATA:  Vomiting wall eating/drinking. Painful swallowing. Food sticking. EXAM: ESOPHOGRAM/BARIUM SWALLOW TECHNIQUE: Combined double contrast and single contrast examination performed using effervescent crystals, thick barium liquid, and thin barium liquid. FLUOROSCOPY TIME:  Fluoroscopy Time:  1 minute 12 seconds Radiation Exposure Index (if provided by the fluoroscopic device): 9.2 mGy Number of Acquired Spot Images: 0 COMPARISON:  None. FINDINGS: Normal swallowing mechanism. No esophageal fold thickening, stricture or obstruction. Normal esophageal motility. A 13 mm barium tablet passed into the stomach without difficulty. IMPRESSION: No findings to explain the patient's clinical history. Electronically Signed   By: Lorin Picket M.D.   On: 02/04/2021 11:55  Subjective: Patient seen and examined at the bedside this morning.  Hemodynamically stable for discharge  Discharge Exam: Vitals:   02/05/21 1432 02/05/21 1441  BP: (!) 119/58 (!) 142/72  Pulse: 70 (!) 53  Resp: 19 13  Temp:    SpO2: 94% 100%   Vitals:   02/05/21 1311 02/05/21 1422 02/05/21 1432 02/05/21 1441  BP: 136/75 123/83 (!) 119/58 (!) 142/72  Pulse: 61 95 70 (!) 53  Resp: 17 (!) 23 19 13   Temp: 98.7 F (37.1 C) 97.9 F (36.6 C)    TempSrc: Oral Oral    SpO2: 99% 98% 94% 100%  Weight:      Height:        General: Pt is alert, awake, not in acute  distress Cardiovascular: RRR, S1/S2 +, no rubs, no gallops Respiratory: CTA bilaterally, no wheezing, no rhonchi Abdominal: Soft, NT, ND, bowel sounds + Extremities: no edema, no cyanosis    The results of significant diagnostics from this hospitalization (including imaging, microbiology, ancillary and laboratory) are listed below for reference.     Microbiology: Recent Results (from the past 240 hour(s))  Resp Panel by RT-PCR (Flu A&B, Covid) Nasopharyngeal Swab     Status: None   Collection Time: 01/30/21 12:12 PM   Specimen: Nasopharyngeal Swab; Nasopharyngeal(NP) swabs in vial transport medium  Result Value Ref Range Status   SARS Coronavirus 2 by RT PCR NEGATIVE NEGATIVE Final    Comment: (NOTE) SARS-CoV-2 target nucleic acids are NOT DETECTED.  The SARS-CoV-2 RNA is generally detectable in upper respiratory specimens during the acute phase of infection. The lowest concentration of SARS-CoV-2 viral copies this assay can detect is 138 copies/mL. A negative result does not preclude SARS-Cov-2 infection and should not be used as the sole basis for treatment or other patient management decisions. A negative result may occur with  improper specimen collection/handling, submission of specimen other than nasopharyngeal swab, presence of viral mutation(s) within the areas targeted by this assay, and inadequate number of viral copies(<138 copies/mL). A negative result must be combined with clinical observations, patient history, and epidemiological information. The expected result is Negative.  Fact Sheet for Patients:  EntrepreneurPulse.com.au  Fact Sheet for Healthcare Providers:  IncredibleEmployment.be  This test is no t yet approved or cleared by the [place] FDA and  has been authorized for detection and/or diagnosis of SARS-CoV-2 by FDA under an Emergency Use Authorization (EUA). This EUA will remain  in effect (meaning this test  can be used) for the duration of the COVID-19 declaration under Section 564(b)(1) of the Act, 21 U.S.C.section 360bbb-3(b)(1), unless the authorization is terminated  or revoked sooner.       Influenza A by PCR NEGATIVE NEGATIVE Final   Influenza B by PCR NEGATIVE NEGATIVE Final    Comment: (NOTE) The Xpert Xpress SARS-CoV-2/FLU/RSV plus assay is intended as an aid in the diagnosis of influenza from Nasopharyngeal swab specimens and should not be used as a sole basis for treatment. Nasal washings and aspirates are unacceptable for Xpert Xpress SARS-CoV-2/FLU/RSV testing.  Fact Sheet for Patients: EntrepreneurPulse.com.au  Fact Sheet for Healthcare Providers: IncredibleEmployment.be  This test is not yet approved or cleared by the [place] FDA and has been authorized for detection and/or diagnosis of SARS-CoV-2 by FDA under an Emergency Use Authorization (EUA). This EUA will remain in effect (meaning this test can be used) for the duration of the COVID-19 declaration under Section 564(b)(1) of the Act, 21 U.S.C. section 360bbb-3(b)(1), unless the authorization is  terminated or revoked.  Performed at [facility], [place] [address] [place], [place] [address]   Expectorated Sputum Assessment w Gram Stain, Rflx to Resp Cult     Status: None   Collection Time: 01/31/21  8:50 AM   Specimen: Sputum  Result Value Ref Range Status   Specimen Description SPUTUM  Final   Special Requests NONE  Final   Sputum evaluation   Final    THIS SPECIMEN IS ACCEPTABLE FOR SPUTUM CULTURE Performed at [facility], [place] [address], [place], [place] [address]    Report Status 01/31/2021 FINAL  Final  Culture, Respiratory w Gram Stain     Status: None   Collection Time: 01/31/21  8:50 AM   Specimen: SPU  Result Value Ref Range Status   Specimen Description   Final    SPUTUM Performed at [facility] [address] [place], [place] [address]    Special Requests   Final    NONE Reflexed from [phone] [id number] Performed at [facility], [place] [address], [place], [place] [address]    Gram Stain   Final    MODERATE WBC PRESENT,BOTH PMN AND MONONUCLEAR ABUNDANT GRAM NEGATIVE RODS FEW GRAM POSITIVE COCCI RARE GRAM POSITIVE RODS Performed at [facility], [place] [address] [place], [place] [address]    Culture   Final    ABUNDANT ESCHERICHIA COLI Confirmed Extended Spectrum Beta-Lactamase [name] (ESBL).  In bloodstream infections from ESBL organisms, carbapenems are preferred over piperacillin/tazobactam. They are shown to have a lower risk of mortality.    Report Status 02/02/2021 FINAL  Final   Organism ID, Bacteria ESCHERICHIA COLI  Final      Susceptibility   Escherichia coli - MIC*    AMPICILLIN >=32 RESISTANT Resistant     CEFAZOLIN >=64 RESISTANT Resistant     CEFEPIME 8 INTERMEDIATE Intermediate     CEFTAZIDIME RESISTANT Resistant     CEFTRIAXONE >=64 RESISTANT Resistant     CIPROFLOXACIN >=4 RESISTANT Resistant     GENTAMICIN <=1 SENSITIVE Sensitive     IMIPENEM <=0.25 SENSITIVE Sensitive     TRIMETH/SULFA >=320 RESISTANT Resistant     AMPICILLIN/SULBACTAM >=32 RESISTANT Resistant     PIP/TAZO 8 SENSITIVE Sensitive     * ABUNDANT ESCHERICHIA COLI  Urine Culture     Status: Abnormal   Collection Time: 01/31/21  8:54 AM   Specimen: Urine, Clean Catch  Result Value Ref Range Status   Specimen Description   Final    URINE, CLEAN CATCH Performed at [facility], [place] [address] [place], [place] [address]    Special Requests   Final    NONE Performed at [facility], [place] [address], [place], [place] [address]    Culture (A)  Final    <10,000 COLONIES/mL INSIGNIFICANT GROWTH Performed at [facility] [address] [place], [place] [address]    Report Status 02/02/2021 FINAL  Final  Culture, blood (routine x  2)     Status: None   Collection Time: 01/31/21 11:39 AM   Specimen: Left Antecubital; Blood  Result Value Ref Range Status   Specimen Description   Final    LEFT ANTECUBITAL Performed at [place] [address], [place],  [address]    Special Requests   Final    BOTTLES DRAWN AEROBIC AND ANAEROBIC Blood Culture adequate volume Performed at [place] [address] [place],  [place] [address]    Culture   Final    NO GROWTH 5 DAYS Performed at [facility], [place] [address], [place], [place] [address]    Report Status 02/05/2021 FINAL  Final  Culture, blood (routine x 2)     Status: None   Collection Time: 01/31/21 11:50 AM   Specimen: BLOOD RIGHT FOREARM  Result Value Ref Range Status   Specimen Description   Final    BLOOD RIGHT FOREARM Performed at [facility] [address], [place], [place] [address]    Special Requests   Final    BOTTLES DRAWN AEROBIC AND ANAEROBIC Blood Culture adequate volume Performed at [facility] [address] [place], [place] [address]    Culture   Final    NO GROWTH 5 DAYS Performed at [facility], [place] [address] [place], [place] [address]    Report Status 02/05/2021 FINAL  Final  MRSA Next Gen by PCR, Nasal     Status: None   Collection Time: 02/04/21  4:36 PM   Specimen: Nasal Mucosa; Nasal Swab  Result Value Ref Range Status   MRSA by PCR Next Gen NOT DETECTED NOT DETECTED Final    Comment: (NOTE) The GeneXpert MRSA Assay (FDA approved for NASAL specimens only), is one component of a comprehensive MRSA colonization surveillance program. It is not intended to diagnose MRSA infection nor to guide or monitor treatment for MRSA infections. Test performance is not FDA approved in patients less than 49 years old. Performed at [facility], [place] [address] [place],  [address]      Labs: BNP (last 3  results) No results for input(s): BNP in the last 8760 hours. Basic Metabolic Panel: Recent Labs  Lab 01/31/21 0429 02/01/21 0327 02/02/21 0358 02/03/21 0409 02/04/21 0420  NA 138 135 137 137 136  K 4.5 3.7 3.4* 4.0 3.9  CL 102 101 102 101 101  CO2 24 25 27 29 30   GLUCOSE 91 83 83 101* 100*  BUN 12 8 8 8 7   CREATININE 0.74 0.64 0.52 0.52 0.70  CALCIUM 9.0 8.5* 8.4* 8.5* 8.7*  MG  --  2.0  --  1.8 2.2  PHOS  --  2.5  --   --   --    Liver Function Tests: Recent Labs  Lab 01/30/21 1140 01/31/21 0429  AST 35 24  ALT 20 18  ALKPHOS 61 56  BILITOT 0.8 0.9  PROT 8.4* 7.9  ALBUMIN 5.1* 4.7   No results for input(s): LIPASE, AMYLASE in the last 168 hours. No results for input(s): AMMONIA in the last 168 hours. CBC: Recent Labs  Lab 01/30/21 1140 01/31/21 0429 02/01/21 0327 02/02/21 0358 02/03/21 0409  WBC 26.1* 19.7* 17.0* 10.8* 10.2  NEUTROABS 23.6*  --   --   --  7.0  HGB 13.9 13.3 11.4* 11.1* 11.3*  HCT 43.0 40.6 34.8* 33.2* 33.5*  MCV 91.9 91.4 90.2 89.7 89.1  PLT 268 274 205 188 202   Cardiac Enzymes: No results for input(s): CKTOTAL, CKMB, CKMBINDEX, TROPONINI in the last 168 hours. BNP: Invalid input(s): POCBNP CBG: Recent Labs  Lab 02/04/21 1138 02/04/21 1720 02/04/21 2029 02/05/21 0726 02/05/21 1103  GLUCAP 81 86 107* 92 87   D-Dimer No results for input(s): DDIMER in the last 72 hours. Hgb A1c No results for input(s): HGBA1C in the last 72 hours. Lipid Profile No results for input(s): CHOL, HDL, LDLCALC, TRIG, CHOLHDL,  LDLDIRECT in the last 72 hours. Thyroid function studies No results for input(s): TSH, T4TOTAL, T3FREE, THYROIDAB in the last 72 hours.  Invalid input(s): FREET3 Anemia work up No results for input(s): VITAMINB12, FOLATE, FERRITIN, TIBC, IRON, RETICCTPCT in the last 72 hours. Urinalysis    Component Value Date/Time   COLORURINE YELLOW 01/31/2021 0845   APPEARANCEUR CLEAR 01/31/2021 0845   LABSPEC 1.010 01/31/2021 0845    PHURINE 5.0 01/31/2021 0845   GLUCOSEU NEGATIVE 01/31/2021 0845   HGBUR MODERATE (A) 01/31/2021 0845   BILIRUBINUR NEGATIVE 01/31/2021 0845   KETONESUR 20 (A) 01/31/2021 0845   PROTEINUR NEGATIVE 01/31/2021 0845   UROBILINOGEN 0.2 12/17/2012 0926   NITRITE NEGATIVE 01/31/2021 0845   LEUKOCYTESUR NEGATIVE 01/31/2021 0845   Sepsis Labs Invalid input(s): PROCALCITONIN,  WBC,  LACTICIDVEN Microbiology Recent Results (from the past 240 hour(s))  Resp Panel by RT-PCR (Flu A&B, Covid) Nasopharyngeal Swab     Status: None   Collection Time: 01/30/21 12:12 PM   Specimen: Nasopharyngeal Swab; Nasopharyngeal(NP) swabs in vial transport medium  Result Value Ref Range Status   SARS Coronavirus 2 by RT PCR NEGATIVE NEGATIVE Final    Comment: (NOTE) SARS-CoV-2 target nucleic acids are NOT DETECTED.  The SARS-CoV-2 RNA is generally detectable in upper respiratory specimens during the acute phase of infection. The lowest concentration of SARS-CoV-2 viral copies this assay can detect is 138 copies/mL. A negative result does not preclude SARS-Cov-2 infection and should not be used as the sole basis for treatment or other patient management decisions. A negative result may occur with  improper specimen collection/handling, submission of specimen other than nasopharyngeal swab, presence of viral mutation(s) within the areas targeted by this assay, and inadequate number of viral copies(<138 copies/mL). A negative result must be combined with clinical observations, patient history, and epidemiological information. The expected result is Negative.  Fact Sheet for Patients:  EntrepreneurPulse.com.au  Fact Sheet for Healthcare Providers:  IncredibleEmployment.be  This test is no t yet approved or cleared by the [place] FDA and  has been authorized for detection and/or diagnosis of SARS-CoV-2 by FDA under an Emergency Use Authorization (EUA). This EUA will  remain  in effect (meaning this test can be used) for the duration of the COVID-19 declaration under Section 564(b)(1) of the Act, 21 U.S.C.section 360bbb-3(b)(1), unless the authorization is terminated  or revoked sooner.       Influenza A by PCR NEGATIVE NEGATIVE Final   Influenza B by PCR NEGATIVE NEGATIVE Final    Comment: (NOTE) The Xpert Xpress SARS-CoV-2/FLU/RSV plus assay is intended as an aid in the diagnosis of influenza from Nasopharyngeal swab specimens and should not be used as a sole basis for treatment. Nasal washings and aspirates are unacceptable for Xpert Xpress SARS-CoV-2/FLU/RSV testing.  Fact Sheet for Patients: EntrepreneurPulse.com.au  Fact Sheet for Healthcare Providers: IncredibleEmployment.be  This test is not yet approved or cleared by the [place] FDA and has been authorized for detection and/or diagnosis of SARS-CoV-2 by FDA under an Emergency Use Authorization (EUA). This EUA will remain in effect (meaning this test can be used) for the duration of the COVID-19 declaration under Section 564(b)(1) of the Act, 21 U.S.C. section 360bbb-3(b)(1), unless the authorization is terminated or revoked.  Performed at [facility], [place] [address], [place], [place] [address]   Expectorated Sputum Assessment w Gram Stain, Rflx to Resp Cult     Status: None   Collection Time: 01/31/21  8:50 AM   Specimen: Sputum  Result Value Ref Range Status   Specimen Description SPUTUM  Final   Special Requests NONE  Final   Sputum evaluation   Final    THIS SPECIMEN IS ACCEPTABLE FOR SPUTUM CULTURE Performed at [facility], [place] [address], [place], [place] [address]    Report Status 01/31/2021 FINAL  Final  Culture, Respiratory w Gram Stain     Status: None   Collection Time: 01/31/21  8:50 AM   Specimen: SPU  Result Value Ref Range Status   Specimen Description   Final     SPUTUM Performed at [place] [address], [place], [place] [address]    Special Requests   Final    NONE Reflexed from [id number] Performed at [facility], [place] [address], [place], [place] [address]    Gram Stain   Final    MODERATE WBC PRESENT,BOTH PMN AND MONONUCLEAR ABUNDANT GRAM NEGATIVE RODS FEW GRAM POSITIVE COCCI RARE GRAM POSITIVE RODS Performed at [facility], [place] [address], [place], [place] [address]    Culture   Final    ABUNDANT ESCHERICHIA COLI Confirmed Extended Spectrum Beta-Lactamase [name] (ESBL).  In bloodstream infections from ESBL organisms, carbapenems are preferred over piperacillin/tazobactam. They are shown to have a lower risk of mortality.    Report Status 02/02/2021 FINAL  Final   Organism ID, Bacteria ESCHERICHIA COLI  Final      Susceptibility   Escherichia coli - MIC*    AMPICILLIN >=32 RESISTANT Resistant     CEFAZOLIN >=64 RESISTANT Resistant     CEFEPIME 8 INTERMEDIATE Intermediate     CEFTAZIDIME RESISTANT Resistant     CEFTRIAXONE >=64 RESISTANT Resistant     CIPROFLOXACIN >=4 RESISTANT Resistant     GENTAMICIN <=1 SENSITIVE Sensitive     IMIPENEM <=0.25 SENSITIVE Sensitive     TRIMETH/SULFA >=320 RESISTANT Resistant     AMPICILLIN/SULBACTAM >=32 RESISTANT Resistant     PIP/TAZO 8 SENSITIVE Sensitive     * ABUNDANT ESCHERICHIA COLI  Urine Culture     Status: Abnormal   Collection Time: 01/31/21  8:54 AM   Specimen: Urine, Clean Catch  Result Value Ref Range Status   Specimen Description   Final    URINE, CLEAN CATCH Performed at [facility], [place] [address], [place], [place] [address]    Special Requests   Final    NONE Performed at [facility], [place] [address] [place], [place] [address]    Culture (A)  Final    <10,000 COLONIES/mL INSIGNIFICANT GROWTH Performed at [place] [address], [place], [place] [address]    Report  Status 02/02/2021 FINAL  Final  Culture, blood (routine x 2)     Status: None   Collection Time: 01/31/21 11:39 AM   Specimen: Left Antecubital; Blood  Result Value Ref Range Status   Specimen Description   Final    LEFT ANTECUBITAL Performed at [place] [address] [place], [place] [address]    Special Requests   Final    BOTTLES DRAWN AEROBIC AND ANAEROBIC Blood Culture adequate volume Performed at [place] [address], [place], [place] [address]    Culture   Final    NO GROWTH 5 DAYS Performed at [facility], [place] [address], [place], [place] [address]    Report Status 02/05/2021 FINAL  Final  Culture, blood (routine x 2)  Status: None   Collection Time: 01/31/21 11:50 AM   Specimen: BLOOD RIGHT FOREARM  Result Value Ref Range Status   Specimen Description   Final    BLOOD RIGHT FOREARM Performed at [place] [address], [place], [place] [address]    Special Requests   Final    BOTTLES DRAWN AEROBIC AND ANAEROBIC Blood Culture adequate volume Performed at [place] [address] [place], [place] [address]    Culture   Final    NO GROWTH 5 DAYS Performed at [facility], [place] [address], [place], [place] [address]    Report Status 02/05/2021 FINAL  Final  MRSA Next Gen by PCR, Nasal     Status: None   Collection Time: 02/04/21  4:36 PM   Specimen: Nasal Mucosa; Nasal Swab  Result Value Ref Range Status   MRSA by PCR Next Gen NOT DETECTED NOT DETECTED Final    Comment: (NOTE) The GeneXpert MRSA Assay (FDA approved for NASAL specimens only), is one component of a comprehensive MRSA colonization surveillance program. It is not intended to diagnose MRSA infection nor to guide or monitor treatment for MRSA infections. Test performance is not FDA approved in patients less than 29 years old. Performed at [facility], [place] [address], [place], [place] [address]     Please note: You were cared for by a hospitalist during your hospital stay. Once you are discharged, your primary care physician will handle any further medical issues. Please note that NO REFILLS for any discharge medications will be authorized once you are discharged, as it is imperative that you return to your primary care physician (or establish a relationship with a primary care physician if you do not have one) for your post hospital discharge needs so that they can reassess your need for medications and monitor your lab values.    Time coordinating discharge: 40 minutes  SIGNED:   Shelly Coss, MD  [facility] 02/05/2021, 2:45 PM Pager [phone]  If 7PM-7AM, please contact night-coverage [email or web address] Password [email or web address]

## 2021-02-07 ENCOUNTER — Encounter (HOSPITAL_COMMUNITY): Payer: Self-pay | Admitting: Gastroenterology

## 2021-02-08 DIAGNOSIS — R296 Repeated falls: Secondary | ICD-10-CM | POA: Diagnosis not present

## 2021-02-08 DIAGNOSIS — Z634 Disappearance and death of family member: Secondary | ICD-10-CM | POA: Diagnosis not present

## 2021-02-08 DIAGNOSIS — F4321 Adjustment disorder with depressed mood: Secondary | ICD-10-CM | POA: Diagnosis not present

## 2021-02-08 DIAGNOSIS — E039 Hypothyroidism, unspecified: Secondary | ICD-10-CM | POA: Diagnosis not present

## 2021-02-15 LAB — OPIATES,MS,WB/SP RFX
6-Acetylmorphine: NEGATIVE
Codeine: NEGATIVE ng/mL
Dihydrocodeine: NEGATIVE ng/mL
Hydrocodone: NEGATIVE ng/mL
Hydromorphone: NEGATIVE ng/mL
Morphine: 15.8 ng/mL
Opiate Confirmation: POSITIVE

## 2021-02-15 LAB — OXYCODONES,MS,WB/SP RFX
Oxycocone: 1.5 ng/mL
Oxycodones Confirmation: POSITIVE
Oxymorphone: NEGATIVE ng/mL

## 2021-02-18 ENCOUNTER — Ambulatory Visit (INDEPENDENT_AMBULATORY_CARE_PROVIDER_SITE_OTHER): Payer: Self-pay | Admitting: Clinical

## 2021-02-18 ENCOUNTER — Other Ambulatory Visit: Payer: Self-pay

## 2021-02-18 DIAGNOSIS — F4321 Adjustment disorder with depressed mood: Secondary | ICD-10-CM

## 2021-02-18 DIAGNOSIS — R1310 Dysphagia, unspecified: Secondary | ICD-10-CM | POA: Diagnosis not present

## 2021-02-18 DIAGNOSIS — K2100 Gastro-esophageal reflux disease with esophagitis, without bleeding: Secondary | ICD-10-CM | POA: Diagnosis not present

## 2021-02-18 NOTE — Plan of Care (Signed)
Pt will develop and implement healthy coping methods to manage grief and loss as evidenced by engaging in physical exercise 4 out of 7 days per week for 20 minutes; staying connected to her support group and reading a book on a daily basis. Pt participated in completion of treatment plan.
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
## 2021-02-25 DIAGNOSIS — M62830 Muscle spasm of back: Secondary | ICD-10-CM | POA: Diagnosis not present

## 2021-02-25 DIAGNOSIS — Z79891 Long term (current) use of opiate analgesic: Secondary | ICD-10-CM | POA: Diagnosis not present

## 2021-02-25 DIAGNOSIS — M961 Postlaminectomy syndrome, not elsewhere classified: Secondary | ICD-10-CM | POA: Diagnosis not present

## 2021-02-25 DIAGNOSIS — G894 Chronic pain syndrome: Secondary | ICD-10-CM | POA: Diagnosis not present

## 2021-02-26 ENCOUNTER — Ambulatory Visit (INDEPENDENT_AMBULATORY_CARE_PROVIDER_SITE_OTHER): Payer: Self-pay | Admitting: Clinical

## 2021-02-26 ENCOUNTER — Other Ambulatory Visit: Payer: Self-pay

## 2021-02-26 DIAGNOSIS — F4321 Adjustment disorder with depressed mood: Secondary | ICD-10-CM

## 2021-02-26 NOTE — Progress Notes (Signed)
° °  THERAPIST PROGRESS NOTE  Session Time: 11am  Participation Level: Active  Behavioral Response: CasualAlertpleasant tearful at times  Type of Therapy: Individual Therapy  Treatment Goals addressed: Coping  Interventions: Supportive Virtual Visit via Video Note  I connected with April Meyer on 02/26/21 at 11:00 AM EST by a video enabled telemedicine application and verified that I am speaking with the correct person using two identifiers.  Location: Patient: home Provider: office   I discussed the limitations of evaluation and management by telemedicine and the availability of in person appointments. The patient expressed understanding and agreed to proceed.   I discussed the assessment and treatment plan with the patient. The patient was provided an opportunity to ask questions and all were answered. The patient agreed with the plan and demonstrated an understanding of the instructions.   The patient was advised to call back or seek an in-person evaluation if the symptoms worsen or if the condition fails to improve as anticipated.  I provided 40 minutes of non-face-to-face time during this encounter.  Summary: Pt presents in a pleasant but tearful mood.  Pt grieving the loss of her mother and son. Pt reports in September 2022 her son overdosed on prescription medications. Pt discussed feeling responsible for the loss stating "I should've been more aware of what was going on so I could have stopped it" Pt  says the holiday season is difficult for her but reports having a strong support system. Pt reports having a shunt in her head that is impacted by the weather. Pt endorses low mood, unmotivated but reports improved sleep pattern.per pt report she is scheduled for psychiatry appt with Dr. Gibson Ramp on 1/17.   Suicidal/Homicidal: Pt denies SI/HI no plan, intent or attempt to harm self or others reported. Therapist Response: Assessed for changes in mood, daily functioning and behavior.  Reviewed and provided information on cycle of depression. Discussed grieving process and assisted in identifying healthy coping skills such as lighting candles, reading a book for leisure, exercising, practicing self compassion(being kind and gentle with self). Pt identifies accountability partners who she plans to use to help her with staying on task. Provided examples of reframing negative cognitions; hw assignment=practice reframing negative thinking, further discuss next session.   Plan: Return again in 2 weeks.  Diagnosis: Axis I: Adjustment disorder with depressed mood    Grief    Axis II: No diagnosis    Yvette Rack, LCSW 02/26/2021

## 2021-03-05 LAB — DRUG SCREEN 10 W/CONF, SERUM
Amphetamines, IA: NEGATIVE ng/mL
Barbiturates, IA: NEGATIVE ug/mL
Benzodiazepines, IA: NEGATIVE ng/mL
Cocaine & Metabolite, IA: NEGATIVE ng/mL
Methadone, IA: NEGATIVE ng/mL
Opiates, IA: POSITIVE ng/mL — AB
Oxycodones, IA: POSITIVE ng/mL — AB
Phencyclidine, IA: NEGATIVE ng/mL
Propoxyphene, IA: NEGATIVE ng/mL
THC(Marijuana) Metabolite, IA: POSITIVE ng/mL — AB

## 2021-03-05 LAB — THC,MS,WB/SP RFX
Cannabidiol: NEGATIVE ng/mL
Cannabinoid Confirmation: POSITIVE
Cannabinol: NEGATIVE ng/mL
Carboxy-THC: 83.7 ng/mL
Hydroxy-THC: 1.6 ng/mL
Tetrahydrocannabinol(THC): 1.5 ng/mL

## 2021-03-18 ENCOUNTER — Other Ambulatory Visit: Payer: Self-pay | Admitting: Obstetrics and Gynecology

## 2021-03-18 ENCOUNTER — Ambulatory Visit (INDEPENDENT_AMBULATORY_CARE_PROVIDER_SITE_OTHER): Payer: PPO | Admitting: Clinical

## 2021-03-18 ENCOUNTER — Other Ambulatory Visit: Payer: Self-pay

## 2021-03-18 DIAGNOSIS — F4321 Adjustment disorder with depressed mood: Secondary | ICD-10-CM | POA: Diagnosis not present

## 2021-03-18 DIAGNOSIS — N920 Excessive and frequent menstruation with regular cycle: Secondary | ICD-10-CM

## 2021-03-18 NOTE — Progress Notes (Signed)
° °  THERAPIST PROGRESS NOTE  Session Time: 11am  Participation Level: Active  Behavioral Response: CasualAlertpleasant  Type of Therapy: Individual Therapy  Treatment Goals addressed: Coping  Interventions: Supportive Virtual Visit via Video Note  I connected with April Meyer on 03/18/21 at 11:00 AM EST by a video enabled telemedicine application and verified that I am speaking with the correct person using two identifiers.  Location: Patient: home Provider: office   I discussed the limitations of evaluation and management by telemedicine and the availability of in person appointments. The patient expressed understanding and agreed to proceed.   I discussed the assessment and treatment plan with the patient. The patient was provided an opportunity to ask questions and all were answered. The patient agreed with the plan and demonstrated an understanding of the instructions.   The patient was advised to call back or seek an in-person evaluation if the symptoms worsen or if the condition fails to improve as anticipated.  I provided 25 minutes of non-face-to-face time during this encounter.  Summary: Pt presents in a pleasant mood. Pt reports mood is "much better" and rates 6/10(10=exceptional). Pt identifies physical health complications as barrier. Pt reports decrease in sadness crying spells and an improved sleep pattern. Per pt report she is scheduled for medication management with Dr. Gibson Ramp on 1/17. Pt states reading for leisure, engaging in physical activity and participating in grief support group is helping with manage life stressors.  Suicidal/Homicidal: Pt denies SI/HI no plan, intent or attempt to harm self or others reported. Pt encouraged to call 911 or go to closest Ed in the event of an emergency.  Therapist Response: Assessed for changes in mood, behavior and daily functioning. Assisted pt in identifying coping methods that may assist in managing life stressors and  discussed importance of implementing on a routine basis.  Plan: Return again in 2 weeks.  Diagnosis: Axis I:  Adjustment disorder with depressed mood                                     Grief    Axis II: No diagnosis    Yvette Rack, LCSW 03/18/2021

## 2021-03-25 NOTE — Progress Notes (Signed)
56 y.o. G3P1011 Divorced White or Caucasian Not Hispanic or Latino female here for annual exam. Patient states that she has not bled since she had her D&C in 4/22. Prior h/o endometrial ablation. She is having tolerable vasomotor symptoms.   Her 40 year old son died in his sleep 4 months ago. Still waiting the investigation.   She lives with her boyfriend. He is very supportive, her son's friends are around a lot. She is in counseling, started on Zoloft.  Sexually active, no pain.   H/O prolapse, feels she has prolapsed more. She was hospitalized in 11/22 with a terrible cough. She feels like the cough worsened her prolapse. She notices it off and on throughout the day. Tolerable.   BM are normal.   H/O mixed incontinence, has seen Urogynecology.  Urogynecology notes from [facility] on 09/07/17 (care everywhere): She was noted to have mixed incontinence, stage 2 uterovaginal prolapse and defecatory dysfunction. She did have urodynamic testing with [facility] noted. If surgery was desired they recommended retropubic midurethral sling.  No LMP recorded. (Menstrual status: Perimenopausal).          Sexually active: Yes.    The current method of family planning is none  Exercising: Yes   Bike 20 min a day  Smoker:  no  Health Maintenance Pap:  05/08/16 WNL  Hr Hpv Neg, 12/16/11 WNL Hr HPV Neg  History of abnormal Pap:  no MMG:  03/15/19 Density B Bi-rads 1 neg  BMD:   none  Colonoscopy: 2018 normal f/u 10 years  TDaP:  unsure  Gardasil: n/a   reports that she quit smoking about 13 years ago. Her smoking use included cigarettes. She has a 42.00 pack-year smoking history. She has never used smokeless tobacco. She reports current drug use. Drug: Marijuana. She reports that she does not drink alcohol. On disability.   Past Medical History:  Diagnosis Date   ADHD (attention deficit hyperactivity disorder)    no meds for   Anxiety    Arnold-Chiari deformity (HCC)    Asthma    AVM (arteriovenous  malformation) spine 04/21/2018   Bruises easily    Cerebral venous sinus thrombosis, remote, resolved 03/28/2013   Chronic pain    Common migraine with intractable migraine 0/26/3785   Complication of anesthesia age 51    breast lumpectomy heart rate went way down in hospital stayed 2 days, no problems since   Complication of anesthesia    C 1 to C 3 fusion side to side and up and down limited    EDS (Ehlers-Danlos syndrome)    Eustachian tube dysfunction    Fatigue    GERD (gastroesophageal reflux disease)    Glioma (HCC)    low graded tectal plate glioma   Hemorrhoids    History of sleep apnea    no cpap used last 2 years, normal sleep study 2 yrs ago   HSV infection    Hypothyroid    Hypothyroidism    IIH (idiopathic intracranial hypertension)    Lumbar disc herniation    Migraine    Neck stiffness    Nystagmus, positional, central type 03/28/2013   Other malaise and fatigue 03/28/2013   " The patient reports feeling happy, ED, sore" on she has undergone at Chiari malformation surgery decompression in 2011. Prior to the surgery were chief complaints were weakness numbness and neck problems. She had swallowing difficulties and dizziness;  Classic Chiari presentation. But she didn't have headaches.   Photophobia    Pre-diabetes  Tinnitus of both ears    Urinary incontinence    Vision disturbance    Vitamin D deficiency    Weakness    Wears glasses     Past Surgical History:  Procedure Laterality Date   ARNOLD CHIARI REPAIR  2010   Brain shunt  12/30/2019   vp shunt   BRAIN SURGERY     BREAST BIOPSY Left    lumpectomy age 89    BREAST EXCISIONAL BIOPSY Right yrs ago   CHOLECYSTECTOMY  age 13   laparoscopic   colonscopy  2012, 2018   DILATION AND CURETTAGE OF UTERUS  last done yrs ago   x 2 or 3   ENDOMETRIAL ABLATION  02/09/2013   [facility]   ESOPHAGOGASTRODUODENOSCOPY N/A 02/05/2021   Procedure: ESOPHAGOGASTRODUODENOSCOPY (EGD);  Surgeon: Carol Ada, MD;   Location: [facility];  Service: Endoscopy;  Laterality: N/A;   HYSTEROSCOPY WITH D & C N/A 06/26/2020   Procedure: DILATATION AND CURETTAGE /HYSTEROSCOPY;  Surgeon: Salvadore Dom, MD;  Location: [facility];  Service: Gynecology;  Laterality: N/A;   INTRAUTERINE DEVICE INSERTION  10/2010   Mirena   IUD REMOVAL  11/2010   could not tolerate hormonal side effects   metal plate removed from head  2015   screw came loose   neck fusion c1-c3  2010   OPERATIVE ULTRASOUND N/A 06/26/2020   Procedure: OPERATIVE ULTRASOUND;  Surgeon: Salvadore Dom, MD;  Location: [facility];  Service: Gynecology;  Laterality: N/A;   TONSILLECTOMY AND ADENOIDECTOMY  age 54   UPPER GI ENDOSCOPY  2012, 2018    Current Outpatient Medications  Medication Sig Dispense Refill   ADVAIR DISKUS 500-50 MCG/ACT AEPB Inhale 1 puff into the lungs 2 (two) times daily.     albuterol (VENTOLIN HFA) 108 (90 Base) MCG/ACT inhaler Inhale 1-2 puffs into the lungs every 4 (four) hours as needed for wheezing or shortness of breath.     benzonatate (TESSALON) 200 MG capsule Take 200 mg by mouth 3 (three) times daily as needed for cough.     busPIRone (BUSPAR) 7.5 MG tablet Take 1 tablet (7.5 mg total) by mouth 2 (two) times daily. 60 tablet 5   ergocalciferol (VITAMIN D2) 1.25 MG (50000 UT) capsule Take 50,000 Units by mouth once a week.     fluticasone (FLONASE) 50 MCG/ACT nasal spray Place into both nostrils. (Patient not taking: Reported on 01/30/2021)     metFORMIN (GLUCOPHAGE-XR) 500 MG 24 hr tablet Take 500 mg by mouth daily.     ondansetron (ZOFRAN) 4 MG tablet Take 1 tablet (4 mg total) by mouth every 8 (eight) hours as needed for nausea or vomiting. 30 tablet 0   Oxycodone HCl 10 MG TABS Take 10 mg by mouth every 4 (four) hours as needed (pain.).      pantoprazole (PROTONIX) 40 MG tablet Take 1 tablet (40 mg total) by mouth 2 (two) times daily. 60 tablet 1   promethazine (PHENERGAN) 25  MG tablet Take 1 tablet (25 mg total) by mouth every 6 (six) hours as needed for nausea. Nausea (Patient not taking: Reported on 01/30/2021) 30 tablet 1   sertraline (ZOLOFT) 50 MG tablet Take 1 tablet (50 mg total) by mouth daily. 30 tablet 5   Suvorexant (BELSOMRA) 10 MG TABS Take 10 mg by mouth at bedtime as needed. 30 tablet 4   SYNTHROID 137 MCG tablet Take 137 mcg by mouth every morning.     UNABLE TO FIND  Take 1 tablet by mouth See admin instructions. Cbd gummies 1 in am Cbd sleep gummy at hs     valACYclovir (VALTREX) 1000 MG tablet TAKE 2 TABS (2 GRAMS) BY MOUTH NOW AND REPEAT IN 12 HOURS X 1, AS NEEDED FOR COLDSORE (Patient not taking: Reported on 01/30/2021) 30 tablet 1   No current facility-administered medications for this visit.    Family History  Problem Relation Age of Onset   Aortic dissection Father    Diabetes Maternal Aunt    Breast cancer Maternal Aunt        40's   Diabetes Maternal Uncle    Breast cancer Paternal Aunt 29   Diabetes Maternal Grandmother    Cancer Maternal Grandmother        SKIN AND LUNG   Diabetes Paternal Grandmother    Breast cancer Maternal Aunt        50's   Breast cancer Maternal Aunt        50's    Review of Systems  All other systems reviewed and are negative.  Exam:   BP 118/62    Pulse 65    Ht 5\' 7"  (1.702 m)    Wt 197 lb (89.4 kg)    SpO2 99%    BMI 30.85 kg/m   Weight change: @WEIGHTCHANGE @ Height:   Height: 5\' 7"  (170.2 cm)  Ht Readings from Last 3 Encounters:  03/27/21 5\' 7"  (1.702 m)  01/31/21 5\' 7"  (1.702 m)  09/05/20 5\' 7"  (1.702 m)    General appearance: alert, cooperative and appears stated age Head: Normocephalic, without obvious abnormality, atraumatic Neck: no adenopathy, supple, symmetrical, trachea midline and thyroid normal to inspection and palpation Lungs: clear to auscultation bilaterally Cardiovascular: regular rate and rhythm Breasts: normal appearance, no masses or tenderness Abdomen: soft,  non-tender; non distended,  no masses,  no organomegaly Extremities: extremities normal, atraumatic, no cyanosis or edema Skin: Skin color, texture, turgor normal. No rashes or lesions Lymph nodes: Cervical, supraclavicular, and axillary nodes normal. No abnormal inguinal nodes palpated Neurologic: Grossly normal   Pelvic: External genitalia:  no lesions              Urethra:  normal appearing urethra with no masses, tenderness or lesions              Bartholins and Skenes: normal                 Vagina: normal appearing vagina with normal color and discharge, no lesions. With valsalva grade 1-2 cystocele and rectocele (only examined supine). Previously with grade 2 uterine prolapse.              Cervix: no lesions               Bimanual Exam:  Uterus:   no masses or tenderness              Adnexa: no mass, fullness, tenderness               Rectovaginal: Confirms               Anus:  normal sphincter tone, no lesions  Gae Dry chaperoned for the exam.  1. Well woman exam Discussed breast self exam Discussed calcium and vit D intake Mammogram overdue, she will schedule Colonoscopy UTD Call with any bleeding  2. Screening for cervical cancer - Cytology - PAP  3. Mixed incontinence She has seen Urogynecology  4. Female genital prolapse, unspecified type Tolerable, she  may f/u with Urogyn

## 2021-03-26 ENCOUNTER — Other Ambulatory Visit: Payer: Self-pay

## 2021-03-26 ENCOUNTER — Ambulatory Visit (HOSPITAL_BASED_OUTPATIENT_CLINIC_OR_DEPARTMENT_OTHER): Payer: PPO | Admitting: Psychiatry

## 2021-03-26 DIAGNOSIS — F324 Major depressive disorder, single episode, in partial remission: Secondary | ICD-10-CM | POA: Diagnosis not present

## 2021-03-26 MED ORDER — BELSOMRA 10 MG PO TABS: 10.0000 mg | ORAL_TABLET | Freq: Every evening | ORAL | 4 refills | Status: DC | PRN

## 2021-03-26 MED ORDER — BUSPIRONE HCL 7.5 MG PO TABS: 7.5000 mg | ORAL_TABLET | Freq: Two times a day (BID) | ORAL | 5 refills | Status: AC

## 2021-03-26 MED ORDER — SERTRALINE HCL 50 MG PO TABS: 50.0000 mg | ORAL_TABLET | Freq: Every day | ORAL | 5 refills | Status: DC

## 2021-03-26 NOTE — Progress Notes (Signed)
Psychiatric Initial Adult Assessment   Patient Identification: April Meyer MRN:  [id number] Date of Evaluation:  03/26/2021 Referral Source: Chief Complaint:   Visit Diagnosis: Major depression  History of Present Illness:    This patient is a 56 year old white divorced female who is being evaluated for her depression.  The patient's mother died approximately 1 year ago after a month of illness.  4 months ago her son died from an overdose unclear if it was due to an accidental overdose or suicide.  Her son was just about ready to start in treatment for depression and had not been experiencing depression symptoms.  Her son was also using a lot of different drugs.  The patient says that her depression over the last 4 months has actually slowly gotten better.  She had a lot of grief initially and actually started to seeing a number of hospice counselors.  She now experiences depression a few days a week but it is not persistent.  Her sleep is disturbed although she has a hard time describing how distinctly it has been different over the last 4 months.  Her appetite is increased.  Her energy level was normal.  She denied really having problems thinking or concentrating and she denies a sense of worthlessness.  She is not suicidal now and never has been.  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  The patient is still able to enjoy things.  She is reading watching TV loves basketball watches a lot of it.  She even goes to basketball games.  The patient also has pets 1 dog and 2 cats.  Presently the patient is in therapy with virtual audiovisual through [facility].  She also was seeing a therapist in the Center as well.  At this time the patient denies use of alcohol.  A decade ago however she drank water regular at a persistent basis.  Patient at this time smokes marijuana on a daily basis for pain.  She actually does not smoke it but she  takes edible products.  She denies the use of any other types of drugs.  The patient has never been psychotic.  In 2013 she had a distinct episode of major depression and did see a therapist for a short period of time.  Patient denies any symptoms of mania.  She denies any symptoms consistent with generalized anxiety disorder or panic disorder or OCD.  Patient's past psychiatric history is significant for never being in a psychiatric hospital and never truly being evaluated by a psychiatric MD.  Patient has been in and out of therapy.  At this time she actually sees to hospice counselors and to regular psychotherapist.  Her medical history is very significant for having a brain malformation and produces headaches.  She also has an intracranial hypertension and has a shunt in place.  The patient also has a low-dose patient also has a significant genetic joint disorder.  As a result of these physical problems she has chronic headaches albeit there is somewhat less at this time.  Patient also describes significant fatigue.  The patient's anxiety is relatively well controlled with a low-dose of BuSpar.  At this time she has been prescribed a month ago 25 mg of Zoloft which she doubts has any effect.  Associated Signs/Symptoms: Depression Symptoms:  depressed mood, (Hypo) Manic Symptoms:     Anxiety Symptoms:     Psychotic Symptoms:  PTSD Symptoms: Negative  Past Psychiatric History: Zoloft  Previous Psychotropic Medications: Yes   Substance Abuse History in the last 12 months:  Yes.    Consequences of Substance Abuse: NA  Past Medical History:  Past Medical History:  Diagnosis Date   ADHD (attention deficit hyperactivity disorder)    no meds for   Anxiety    Arnold-Chiari deformity (HCC)    Asthma    AVM (arteriovenous malformation) spine 04/21/2018   Bruises easily    Cerebral venous sinus thrombosis, remote, resolved 03/28/2013   Chronic pain    Common migraine with intractable migraine  7/61/9509   Complication of anesthesia age 42    breast lumpectomy heart rate went way down in hospital stayed 2 days, no problems since   Complication of anesthesia    C 1 to C 3 fusion side to side and up and down limited    EDS (Ehlers-Danlos syndrome)    Eustachian tube dysfunction    Fatigue    GERD (gastroesophageal reflux disease)    Glioma (HCC)    low graded tectal plate glioma   Hemorrhoids    History of sleep apnea    no cpap used last 2 years, normal sleep study 2 yrs ago   HSV infection    Hypothyroid    Hypothyroidism    IIH (idiopathic intracranial hypertension)    Lumbar disc herniation    Migraine    Neck stiffness    Nystagmus, positional, central type 03/28/2013   Other malaise and fatigue 03/28/2013   " The patient reports feeling happy, ED, sore" on she has undergone at Chiari malformation surgery decompression in 2011. Prior to the surgery were chief complaints were weakness numbness and neck problems. She had swallowing difficulties and dizziness;  Classic Chiari presentation. But she didn't have headaches.   Photophobia    Pre-diabetes    Tinnitus of both ears    Urinary incontinence    Vision disturbance    Vitamin D deficiency    Weakness    Wears glasses     Past Surgical History:  Procedure Laterality Date   ARNOLD CHIARI REPAIR  2010   Brain shunt  12/30/2019   vp shunt   BRAIN SURGERY     BREAST BIOPSY Left    lumpectomy age 75    BREAST EXCISIONAL BIOPSY Right yrs ago   CHOLECYSTECTOMY  age 25   laparoscopic   colonscopy  2012, 2018   DILATION AND CURETTAGE OF UTERUS  last done yrs ago   x 2 or 3   ENDOMETRIAL ABLATION  02/09/2013   [facility]   ESOPHAGOGASTRODUODENOSCOPY N/A 02/05/2021   Procedure: ESOPHAGOGASTRODUODENOSCOPY (EGD);  Surgeon: Carol Ada, MD;  Location: [facility];  Service: Endoscopy;  Laterality: N/A;   HYSTEROSCOPY WITH D & C N/A 06/26/2020   Procedure: DILATATION AND CURETTAGE /HYSTEROSCOPY;  Surgeon: Salvadore Dom, MD;  Location: [facility];  Service: Gynecology;  Laterality: N/A;   INTRAUTERINE DEVICE INSERTION  10/2010   Mirena   IUD REMOVAL  11/2010   could not tolerate hormonal side effects   metal plate removed from head  2015   screw came loose   neck fusion c1-c3  2010   OPERATIVE ULTRASOUND N/A 06/26/2020   Procedure: OPERATIVE ULTRASOUND;  Surgeon: Salvadore Dom, MD;  Location: [facility];  Service: Gynecology;  Laterality: N/A;   TONSILLECTOMY AND ADENOIDECTOMY  age 4   UPPER GI ENDOSCOPY  2012, 2018  Family Psychiatric History:   Family History:  Family History  Problem Relation Age of Onset   Aortic dissection Father    Diabetes Maternal Aunt    Breast cancer Maternal Aunt        40's   Diabetes Maternal Uncle    Breast cancer Paternal Aunt 57   Diabetes Maternal Grandmother    Cancer Maternal Grandmother        SKIN AND LUNG   Diabetes Paternal Grandmother    Breast cancer Maternal Aunt        50's   Breast cancer Maternal Aunt        50's    Social History:   Social History   Socioeconomic History   Marital status: Divorced    Spouse name: Not on file   Number of children: 1   Years of education: 16   Highest education level: Not on file  Occupational History    Employer: OTHER  Tobacco Use   Smoking status: Former    Packs/day: 2.00    Years: 21.00    Pack years: 42.00    Types: Cigarettes    Quit date: 09/09/2007    Years since quitting: 13.5   Smokeless tobacco: Never  Vaping Use   Vaping Use: Never used  Substance and Sexual Activity   Alcohol use: No    Comment: h/o binge drinking none in 10 years   Drug use: Yes    Types: Marijuana    Comment: daily  use of marijuana recently   Sexual activity: Yes    Partners: Male    Birth control/protection: Other-see comments    Comment: Vasectomy-1st intercourse 56 yo-More than 5 partners  Other Topics Concern   Not on file  Social History Narrative    Patient is divorced and lives at home with her one child.   Patient is disabled.   Patient is right-handed.   Patient has a college education.   Patient drinks one cup of coffee daily.   Social Determinants of Health   Financial Resource Strain: Not on file  Food Insecurity: Not on file  Transportation Needs: Not on file  Physical Activity: Not on file  Stress: Not on file  Social Connections: Not on file    Additional Social History:   Allergies:   Allergies  Allergen Reactions   Other Anaphylaxis    Steroids and Zucchini    Prednisone Anaphylaxis   Morphine And Related Itching    Severe    Metabolic Disorder Labs: Lab Results  Component Value Date   HGBA1C 5.1 01/30/2021   MPG 99.67 01/30/2021   MPG 114 06/12/2020   No results found for: PROLACTIN No results found for: CHOL, TRIG, HDL, CHOLHDL, VLDL, LDLCALC Lab Results  Component Value Date   TSH 16.268 (H) 01/30/2021    Therapeutic Level Labs: No results found for: LITHIUM No results found for: CBMZ No results found for: VALPROATE  Current Medications: Current Outpatient Medications  Medication Sig Dispense Refill   sertraline (ZOLOFT) 50 MG tablet Take 1 tablet (50 mg total) by mouth daily. 30 tablet 5   Suvorexant (BELSOMRA) 10 MG TABS Take 10 mg by mouth at bedtime as needed. 30 tablet 4   ADVAIR DISKUS 500-50 MCG/ACT AEPB Inhale 1 puff into the lungs 2 (two) times daily.     albuterol (VENTOLIN HFA) 108 (90 Base) MCG/ACT inhaler Inhale 1-2 puffs into the lungs every 4 (four) hours as needed for wheezing or shortness of breath.  benzonatate (TESSALON) 200 MG capsule Take 200 mg by mouth 3 (three) times daily as needed for cough.     busPIRone (BUSPAR) 7.5 MG tablet Take 1 tablet (7.5 mg total) by mouth 2 (two) times daily. 60 tablet 5   ergocalciferol (VITAMIN D2) 1.25 MG (50000 UT) capsule Take 50,000 Units by mouth once a week.     fluticasone (FLONASE) 50 MCG/ACT nasal spray Place into both  nostrils. (Patient not taking: Reported on 01/30/2021)     metFORMIN (GLUCOPHAGE-XR) 500 MG 24 hr tablet Take 500 mg by mouth daily.     ondansetron (ZOFRAN) 4 MG tablet Take 1 tablet (4 mg total) by mouth every 8 (eight) hours as needed for nausea or vomiting. 30 tablet 0   Oxycodone HCl 10 MG TABS Take 10 mg by mouth every 4 (four) hours as needed (pain.).      pantoprazole (PROTONIX) 40 MG tablet Take 1 tablet (40 mg total) by mouth 2 (two) times daily. 60 tablet 1   promethazine (PHENERGAN) 25 MG tablet Take 1 tablet (25 mg total) by mouth every 6 (six) hours as needed for nausea. Nausea (Patient not taking: Reported on 01/30/2021) 30 tablet 1   SYNTHROID 137 MCG tablet Take 137 mcg by mouth every morning.     UNABLE TO FIND Take 1 tablet by mouth See admin instructions. Cbd gummies 1 in am Cbd sleep gummy at hs     valACYclovir (VALTREX) 1000 MG tablet TAKE 2 TABS (2 GRAMS) BY MOUTH NOW AND REPEAT IN 12 HOURS X 1, AS NEEDED FOR COL[place]ORE (Patient not taking: Reported on 01/30/2021) 30 tablet 1   No current facility-administered medications for this visit.    Musculoskeletal: Strength & Muscle Tone: within normal limits Gait & Station: normal Patient leans: Right  Psychiatric Specialty Exam: Review of Systems  There were no vitals taken for this visit.There is no height or weight on file to calculate BMI.  General Appearance: Negative  Eye Contact:  Good  Speech:  Negative  Volume:  Normal  Mood:  Negative  Affect:  Congruent  Thought Process:  Goal Directed  Orientation:  Full (Time, Place, and Person)  Thought Content:  Logical  Suicidal Thoughts:  No  Homicidal Thoughts:  No  Memory:  NA  Judgement:  Good  Insight:  Good  Psychomotor Activity:  Normal  Concentration:  Concentration: Good  Recall:  Good  Fund of Knowledge:Good  Language: Good  Akathisia:  No  Handed:  Right  AIMS (if indicated):  not done  Assets:  Desire for Improvement  ADL's:  Intact  Cognition:  WNL  Sleep:  Good   Screenings: PHQ2-9    Flowsheet Row Counselor from 02/18/2021 in [facility]  PHQ-2 Total Score 3  PHQ-9 Total Score 11      Flowsheet Row Counselor from 02/18/2021 in [facility] ED to Hosp-Admission (Discharged) from 01/30/2021 in [facility] Admission (Discharged) from 06/26/2020 in [facility] No Risk Error: Question 1 not populated No Risk       Assessment and Plan:   At this time this patient is relatively stable.  Her diagnosis is major depression residual.  She would also have a difficult uncomplicated bereavement.  Overall her depression state is slowly getting better consistent with the natural course of grieving.  Nonetheless in 2013 she had a distinct episode of major depression and I think some of the  symptoms have returned for understandable reasons.  At this time her interventions will be to adjust her Zoloft to be more appropriate dose of 50 mg.  The patient will continue in a variety of different talking therapies.  She is speaking erratically to some hospice counselors but importantly she is seeing her therapist here on a regular basis.  She should continue all these interventions.  Her second problem is insomnia.  At this time we will begin her on Belsomra 10 mg.  In the past she tried to take Ambien and Lunesta and have problems.  The patient certainly is not suicidal.  She is actually functioning quite well.  She will return to see me in approximately 2 months.   Jerral Ralph, MD 1/17/20233:51 PM

## 2021-03-27 ENCOUNTER — Other Ambulatory Visit (HOSPITAL_COMMUNITY)
Admission: RE | Admit: 2021-03-27 | Discharge: 2021-03-27 | Disposition: A | Discharge status: Home / Self Care | Payer: PPO | Source: Ambulatory Visit | Attending: Obstetrics and Gynecology | Admitting: Obstetrics and Gynecology

## 2021-03-27 ENCOUNTER — Ambulatory Visit (INDEPENDENT_AMBULATORY_CARE_PROVIDER_SITE_OTHER): Payer: PPO | Admitting: Obstetrics and Gynecology

## 2021-03-27 ENCOUNTER — Encounter: Payer: Self-pay | Admitting: Obstetrics and Gynecology

## 2021-03-27 VITALS — BP 118/62 | HR 65 | Ht 67.0 in | Wt 197.0 lb

## 2021-03-27 DIAGNOSIS — N3946 Mixed incontinence: Secondary | ICD-10-CM

## 2021-03-27 DIAGNOSIS — Z124 Encounter for screening for malignant neoplasm of cervix: Secondary | ICD-10-CM | POA: Diagnosis not present

## 2021-03-27 DIAGNOSIS — Z1151 Encounter for screening for human papillomavirus (HPV): Secondary | ICD-10-CM | POA: Diagnosis not present

## 2021-03-27 DIAGNOSIS — N819 Female genital prolapse, unspecified: Secondary | ICD-10-CM

## 2021-03-27 DIAGNOSIS — Z01419 Encounter for gynecological examination (general) (routine) without abnormal findings: Secondary | ICD-10-CM | POA: Diagnosis not present

## 2021-03-27 NOTE — Patient Instructions (Addendum)
Call with any bleeding  EXERCISE   We recommended that you start or continue a regular exercise program for good health. Physical activity is anything that gets your body moving, some is better than none. The CDC recommends 150 minutes per week of Moderate-Intensity Aerobic Activity and 2 or more days of Muscle Strengthening Activity.  Benefits of exercise are limitless: helps weight loss/weight maintenance, improves mood and energy, helps with depression and anxiety, improves sleep, tones and strengthens muscles, improves balance, improves bone density, protects from chronic conditions such as heart disease, high blood pressure and diabetes and so much more. To learn more visit: WhyNotPoker.uy  DIET: Good nutrition starts with a healthy diet of fruits, vegetables, whole grains, and lean protein sources. Drink plenty of water for hydration. Minimize empty calories, sodium, sweets. For more information about dietary recommendations visit: GeekRegister.com.ee and http://schaefer-mitchell.com/  ALCOHOL:  Women should limit their alcohol intake to no more than 7 drinks/beers/glasses of wine (combined, not each!) per week. Moderation of alcohol intake to this level decreases your risk of breast cancer and liver damage.  If you are concerned that you may have a problem, or your friends have told you they are concerned about your drinking, there are many resources to help. A well-known program that is free, effective, and available to all people all over the nation is Alcoholics Anonymous.  Check out this site to learn more: BlockTaxes.se   CALCIUM AND VITAMIN D:  Adequate intake of calcium and Vitamin D are recommended for bone health.  You should be getting between 1000-1200 mg of calcium and 800 units of Vitamin D daily between diet and supplements  PAP SMEARS:  Pap smears, to check for cervical cancer or precancers,   have traditionally been done yearly, scientific advances have shown that most women can have pap smears less often.  However, every woman still should have a physical exam from her gynecologist every year. It will include a breast check, inspection of the vulva and vagina to check for abnormal growths or skin changes, a visual exam of the cervix, and then an exam to evaluate the size and shape of the uterus and ovaries. We will also provide age appropriate advice regarding health maintenance, like when you should have certain vaccines, screening for sexually transmitted diseases, bone density testing, colonoscopy, mammograms, etc.   MAMMOGRAMS:  All women over 20 years old should have a routine mammogram.   COLON CANCER SCREENING: Now recommend starting at age 84. At this time colonoscopy is not covered for routine screening until 50. There are take home tests that can be done between 45-49.   COLONOSCOPY:  Colonoscopy to screen for colon cancer is recommended for all women at age 67.  We know, you hate the idea of the prep.  We agree, BUT, having colon cancer and not knowing it is worse!!  Colon cancer so often starts as a polyp that can be seen and removed at colonscopy, which can quite literally save your life!  And if your first colonoscopy is normal and you have no family history of colon cancer, most women don't have to have it again for 10 years.  Once every ten years, you can do something that may end up saving your life, right?  We will be happy to help you get it scheduled when you are ready.  Be sure to check your insurance coverage so you understand how much it will cost.  It may be covered as a preventative service at no  cost, but you should check your particular policy.      Breast Self-Awareness Breast self-awareness means being familiar with how your breasts look and feel. It involves checking your breasts regularly and reporting any changes to your health care provider. Practicing breast  self-awareness is important. A change in your breasts can be a sign of a serious medical problem. Being familiar with how your breasts look and feel allows you to find any problems early, when treatment is more likely to be successful. All women should practice breast self-awareness, including women who have had breast implants. How to do a breast self-exam One way to learn what is normal for your breasts and whether your breasts are changing is to do a breast self-exam. To do a breast self-exam: Look for Changes  Remove all the clothing above your waist. Stand in front of a mirror in a room with good lighting. Put your hands on your hips. Push your hands firmly downward. Compare your breasts in the mirror. Look for differences between them (asymmetry), such as: Differences in shape. Differences in size. Puckers, dips, and bumps in one breast and not the other. Look at each breast for changes in your skin, such as: Redness. Scaly areas. Look for changes in your nipples, such as: Discharge. Bleeding. Dimpling. Redness. A change in position. Feel for Changes Carefully feel your breasts for lumps and changes. It is best to do this while lying on your back on the floor and again while sitting or standing in the shower or tub with soapy water on your skin. Feel each breast in the following way: Place the arm on the side of the breast you are examining above your head. Feel your breast with the other hand. Start in the nipple area and make  inch (2 cm) overlapping circles to feel your breast. Use the pads of your three middle fingers to do this. Apply light pressure, then medium pressure, then firm pressure. The light pressure will allow you to feel the tissue closest to the skin. The medium pressure will allow you to feel the tissue that is a little deeper. The firm pressure will allow you to feel the tissue close to the ribs. Continue the overlapping circles, moving downward over the breast  until you feel your ribs below your breast. Move one finger-width toward the center of the body. Continue to use the  inch (2 cm) overlapping circles to feel your breast as you move slowly up toward your collarbone. Continue the up and down exam using all three pressures until you reach your armpit.  Write Down What You Find  Write down what is normal for each breast and any changes that you find. Keep a written record with breast changes or normal findings for each breast. By writing this information down, you do not need to depend only on memory for size, tenderness, or location. Write down where you are in your menstrual cycle, if you are still menstruating. If you are having trouble noticing differences in your breasts, do not get discouraged. With time you will become more familiar with the variations in your breasts and more comfortable with the exam. How often should I examine my breasts? Examine your breasts every month. If you are breastfeeding, the best time to examine your breasts is after a feeding or after using a breast pump. If you menstruate, the best time to examine your breasts is 5-7 days after your period is over. During your period, your breasts are lumpier,  and it may be more difficult to notice changes. When should I see my health care provider? See your health care provider if you notice: A change in shape or size of your breasts or nipples. A change in the skin of your breast or nipples, such as a reddened or scaly area. Unusual discharge from your nipples. A lump or thick area that was not there before. Pain in your breasts. Anything that concerns you.  

## 2021-03-28 LAB — CYTOLOGY - PAP
Comment: NEGATIVE
Diagnosis: NEGATIVE
High risk HPV: NEGATIVE

## 2021-04-02 ENCOUNTER — Ambulatory Visit (INDEPENDENT_AMBULATORY_CARE_PROVIDER_SITE_OTHER): Payer: PPO | Admitting: Clinical

## 2021-04-02 ENCOUNTER — Other Ambulatory Visit: Payer: Self-pay

## 2021-04-02 DIAGNOSIS — F4321 Adjustment disorder with depressed mood: Secondary | ICD-10-CM

## 2021-04-02 NOTE — Progress Notes (Signed)
° °  THERAPIST PROGRESS NOTE  Session Time: 9am  Participation Level: Active  Behavioral Response: Casual and NeatAlertpleasant, tearful at times  Type of Therapy: Individual Therapy  Treatment Goals addressed: Coping  Interventions: Reframing Virtual Visit via Video Note  I connected with April Meyer on 04/02/21 at  9:00 AM EST by a video enabled telemedicine application and verified that I am speaking with the correct person using two identifiers.  Location: Patient: home Provider: office   I discussed the limitations of evaluation and management by telemedicine and the availability of in person appointments. The patient expressed understanding and agreed to proceed.   I discussed the assessment and treatment plan with the patient. The patient was provided an opportunity to ask questions and all were answered. The patient agreed with the plan and demonstrated an understanding of the instructions.   The patient was advised to call back or seek an in-person evaluation if the symptoms worsen or if the condition fails to improve as anticipated.  I provided 40 minutes of non-face-to-face time during this encounter.  Summary: Pt tearful at times as she discussed grieving the loss of her deceased son. Pt says it has been comforting having her son's friends help her redo his bedroom and spend time with her. Also, pt says she has three pets that have been an emotional support during this time. Pt reports she is a part of an online chat support group but says she is planning to attend an in person grief support this evening. Additionally pt states on 1/30 she plans to attend the grief group her church is hosting.  Suicidal/Homicidal: Pt denies SI/HI no plan, intent or attempt to harm self or others reported. Pt encouraged to call 911 or go to closest Ed in the event of an emergency.  Therapist Response: Actively listened as pt discussed how she has practiced reframing negative thinking. Pt says  it has been challenging to do so but agreeable to continue practicing skill. Processed practicing good self care to manage life stressors. Discussed benefits of participating in group therapy and having a strong support network.  Plan: Return again in 2 weeks.  Diagnosis: Axis I: Adjustment Disorder with Depressed Mood    Grief    Axis II: No diagnosis    Yvette Rack, LCSW 04/02/2021

## 2021-04-16 ENCOUNTER — Ambulatory Visit (INDEPENDENT_AMBULATORY_CARE_PROVIDER_SITE_OTHER): Payer: PPO | Admitting: Clinical

## 2021-04-16 ENCOUNTER — Other Ambulatory Visit: Payer: Self-pay

## 2021-04-16 DIAGNOSIS — F4321 Adjustment disorder with depressed mood: Secondary | ICD-10-CM

## 2021-04-16 NOTE — Progress Notes (Signed)
° °  THERAPIST PROGRESS NOTE  Session Time: 9am  Participation Level: Active  Behavioral Response: Casual and NeatAlerttearful  Type of Therapy: Individual Therapy  Treatment Goals addressed: Coping  Interventions: Supportive Virtual Visit via Video Note  I connected with April Meyer on 04/16/21 at  9:00 AM EST by a video enabled telemedicine application and verified that I am speaking with the correct person using two identifiers.  Location: Patient: home Provider: office   I discussed the limitations of evaluation and management by telemedicine and the availability of in person appointments. The patient expressed understanding and agreed to proceed.   I discussed the assessment and treatment plan with the patient. The patient was provided an opportunity to ask questions and all were answered. The patient agreed with the plan and demonstrated an understanding of the instructions.   The patient was advised to call back or seek an in-person evaluation if the symptoms worsen or if the condition fails to improve as anticipated.  I provided 32 minutes of non-face-to-face time during this encounter.   Summary: Pt reports she has an upper respiratory infection. Pt reports she is planning to return to the grief support group tonight and found the initial meeting beneficial. Pt discussed her son transitioning five months ago from a drug overdose and mother transitioning one year ago. Additionally, pt states her aunt(mother figure) is having surgery this week and raises concern about her health. Pt says she is looking forward to pampering herself tomorrow as she discussed getting a massage and manicure.  Suicidal/Homicidal: Pt denies SI/HI no plan, intent or attempt to harm self or others reported. Pt encouraged to call 911 or go to closest Ed in the event of an emergency.  Therapist Response: validated and supported pt as she discussed loss of her son. Highlighted pt strengths that may go  unnoticed to assist her in grieving process. Assisted pt in identifying things in her control during times that feel chaotic and out of control. Encouraged her to utilize support system to assist in managing challenging situations(church family, friends, support group members).  Plan: Return again in 2 weeks.  Diagnosis: Axis I:  Adjustment Disorder with Depressed Mood                                     Grief    Axis II: No diagnosis    Yvette Rack, LCSW 04/16/2021

## 2021-04-17 ENCOUNTER — Other Ambulatory Visit: Payer: Self-pay | Admitting: Obstetrics and Gynecology

## 2021-04-17 DIAGNOSIS — Z1231 Encounter for screening mammogram for malignant neoplasm of breast: Secondary | ICD-10-CM

## 2021-04-19 ENCOUNTER — Other Ambulatory Visit (HOSPITAL_COMMUNITY): Payer: Self-pay | Admitting: Psychiatry

## 2021-04-26 ENCOUNTER — Ambulatory Visit: Payer: PPO | Admitting: Podiatry

## 2021-04-26 ENCOUNTER — Encounter: Payer: Self-pay | Admitting: Podiatry

## 2021-04-26 ENCOUNTER — Other Ambulatory Visit: Payer: Self-pay

## 2021-04-26 DIAGNOSIS — B070 Plantar wart: Secondary | ICD-10-CM | POA: Diagnosis not present

## 2021-04-26 DIAGNOSIS — L600 Ingrowing nail: Secondary | ICD-10-CM

## 2021-04-26 NOTE — Patient Instructions (Signed)

## 2021-04-27 NOTE — Progress Notes (Signed)
Subjective:   Patient ID: April Meyer, female   DOB: 56 y.o.   MRN: [id number]   HPI Patient presents stating she has a lesion on the bottom of her right foot that is been sore and she has had chronic ingrown toenails of her big toes that the pedicurist tries to work on that it increasingly difficult.  Patient states that the nails get sore not infected.  Patient does not currently smoke and likes to be active   Review of Systems  All other systems reviewed and are negative.      Objective:  Physical Exam Vitals and nursing note reviewed.  Constitutional:      Appearance: She is well-developed.  Pulmonary:     Effort: Pulmonary effort is normal.  Musculoskeletal:        General: Normal range of motion.  Skin:    General: Skin is warm.  Neurological:     Mental Status: She is alert.    Neurovascular status intact muscle strength found to be adequate range of motion adequate.  Patient does have a lesion plantar aspect right foot in the mid arch that measures about 5 x 5 mm that shows slight pinpoint bleeding upon debridement and on each hallux there is incurvation of the borders medial over lateral borders with the medial being painful.  Patient is found to have good digital perfusion well oriented x3     Assessment:  Acute ingrown toenail deformity with chronic elements of hallux bilateral with pain along with probable plantar verruca right     Plan:  H&P educated on all conditions recommended correction of nails explained procedure risk and allowed her to read and signed consent form for permanent correction.  I infiltrated each hallux 60 mg like Marcaine mixture sterile prep done using sterile instrumentation remove the medial borders exposed matrix applied phenol 3 applications 30 seconds followed by alcohol lavage and sterile dressing gave instructions on soaks.  She will leave dressings on 24 hours take them off earlier if throbbing were to occur and for the lesion I applied a  small amount of chemical agent after sterile debridement advised her on what to do if any blistering were to occur and will be seen back if that were to become symptomatic again

## 2021-04-30 ENCOUNTER — Other Ambulatory Visit: Payer: Self-pay

## 2021-04-30 ENCOUNTER — Ambulatory Visit
Admission: RE | Admit: 2021-04-30 | Discharge: 2021-04-30 | Disposition: A | Discharge status: Home / Self Care | Payer: PPO | Source: Ambulatory Visit | Attending: Obstetrics and Gynecology | Admitting: Obstetrics and Gynecology

## 2021-04-30 DIAGNOSIS — Z1231 Encounter for screening mammogram for malignant neoplasm of breast: Secondary | ICD-10-CM

## 2021-05-07 ENCOUNTER — Other Ambulatory Visit: Payer: Self-pay | Admitting: Obstetrics and Gynecology

## 2021-05-07 DIAGNOSIS — N920 Excessive and frequent menstruation with regular cycle: Secondary | ICD-10-CM

## 2021-05-09 ENCOUNTER — Other Ambulatory Visit: Payer: Self-pay

## 2021-05-09 ENCOUNTER — Telehealth (HOSPITAL_COMMUNITY): Payer: Self-pay | Admitting: Clinical

## 2021-05-09 ENCOUNTER — Ambulatory Visit (HOSPITAL_COMMUNITY): Payer: PPO | Admitting: Clinical

## 2021-05-13 ENCOUNTER — Encounter: Payer: Self-pay | Admitting: Obstetrics and Gynecology

## 2021-05-27 ENCOUNTER — Telehealth: Payer: Self-pay

## 2021-05-27 ENCOUNTER — Other Ambulatory Visit: Payer: Self-pay

## 2021-05-27 ENCOUNTER — Encounter: Payer: Self-pay | Admitting: Obstetrics and Gynecology

## 2021-05-27 ENCOUNTER — Ambulatory Visit: Payer: PPO | Admitting: Obstetrics and Gynecology

## 2021-05-27 VITALS — BP 110/72 | HR 88 | Ht 66.5 in | Wt 201.0 lb

## 2021-05-27 DIAGNOSIS — N39498 Other specified urinary incontinence: Secondary | ICD-10-CM

## 2021-05-27 DIAGNOSIS — N3946 Mixed incontinence: Secondary | ICD-10-CM | POA: Diagnosis not present

## 2021-05-27 DIAGNOSIS — N8111 Cystocele, midline: Secondary | ICD-10-CM

## 2021-05-27 DIAGNOSIS — N814 Uterovaginal prolapse, unspecified: Secondary | ICD-10-CM

## 2021-05-27 DIAGNOSIS — N819 Female genital prolapse, unspecified: Secondary | ICD-10-CM

## 2021-05-27 NOTE — Telephone Encounter (Signed)
Referral order placed in Epic. 

## 2021-05-27 NOTE — Addendum Note (Signed)
Addended by: Ramond Craver on: 05/27/2021 02:50 PM ? ? Modules accepted: Orders ? ?

## 2021-05-27 NOTE — Telephone Encounter (Signed)
Salvadore Dom, MD  [facility] Triage ?Please refer to Dr Wannetta Sender for a consultation for genital prolapse and urinary incontinence.  ?

## 2021-05-27 NOTE — Progress Notes (Signed)
GYNECOLOGY  VISIT ?  ?HPI: ?56 y.o.   Divorced White or Caucasian Not Hispanic or Latino  female   ?[id number] with Patient's last menstrual period was 08/10/2020.   ?here for for worsening bladder and uterine prolapse. She feels her bulge has gotten worse and it's bothering her.   ? ?At the time of hysteroscopy, D&C in 4/22 she was noted to have:  ?a grade 2 uterine prolapse with traction from the tenaculum. The cervix came to within 1 cm of the introitus. She had a small grade 2 cystocele, rectocele not as obvious.  ? ?H/O mixed incontinence, saw Urogynecology in 2019.  ?Urogynecology notes from [facility] on 09/07/17 (care everywhere): ?She was noted to have mixed incontinence, stage 2 uterovaginal prolapse and defecatory dysfunction. She did have urodynamic testing with SUI noted. If surgery was desired they recommended retropubic midurethral sling. ? ?She was seen by a Urologist last week and they recommended hysterectomy and prolapse repair via the robot. Details were not discussed. They felt she needed to get this done.  ?She has a h/o Ehlers-Danlos syndrome and is wondering if she needs to go to [facility] for her surgery.  ? ? ?GYNECOLOGIC HISTORY: ?Patient's last menstrual period was 08/10/2020. ?Contraception:PMP  ?Menopausal hormone therapy: none  ?       ?OB History   ? ? Gravida  ?2  ? Para  ?1  ? Term  ?1  ? Preterm  ?   ? AB  ?1  ? Living  ?1  ?  ? ? SAB  ?   ? IAB  ?   ? Ectopic  ?1  ? Multiple  ?   ? Live Births  ?   ?   ?  ?  ?    ? ?Patient Active Problem List  ? Diagnosis Date Noted  ? Narcotic overdose ([place]) 01/30/2021  ? DM2 (diabetes mellitus, type 2) ([place]) 01/30/2021  ? GERD (gastroesophageal reflux disease) 01/30/2021  ? Acute respiratory failure with hypoxia and hypercapnia ([place]) 01/30/2021  ? Raynaud's phenomenon 11/02/2020  ? Rotator cuff arthropathy of left shoulder 11/02/2020  ? Scoliosis 11/02/2020  ? Pain in joint involving pelvic region and thigh 11/02/2020  ? S/P VP shunt 06/19/2020  ?  Attention deficit hyperactivity disorder 06/12/2020  ? Chronic low back pain 06/12/2020  ? Chronic obstructive pulmonary disease, unspecified ([place]) 06/12/2020  ? Compression of brain ([place]) 06/12/2020  ? Ehlers-Danlos syndrome, unspecified 06/12/2020  ? Encounter for general adult medical examination without abnormal findings 06/12/2020  ? Prediabetes   ? Hypothyroid   ? AVM (arteriovenous malformation) spine 04/21/2018  ? Common migraine with intractable migraine 04/21/2018  ? Other malaise and fatigue 03/28/2013  ? History of Chiari malformation 03/28/2013  ? Hypersomnia, persistent 03/28/2013  ? Cerebral venous sinus thrombosis, remote, resolved 03/28/2013  ? Nystagmus, positional, central type 03/28/2013  ? Menorrhagia 07/31/2011  ? ? ?Past Medical History:  ?Diagnosis Date  ? ADHD (attention deficit hyperactivity disorder)   ? no meds for  ? Anxiety   ? Arnold-Chiari deformity (HCC)   ? Asthma   ? AVM (arteriovenous malformation) spine 04/21/2018  ? Bruises easily   ? Cerebral venous sinus thrombosis, remote, resolved 03/28/2013  ? Chronic pain   ? Common migraine with intractable migraine 04/21/2018  ? Complication of anesthesia age 25   ? breast lumpectomy heart rate went way down in hospital stayed 2 days, no problems since  ? Complication of anesthesia   ? C 1 to  C 3 fusion side to side and up and down limited   ? EDS (Ehlers-Danlos syndrome)   ? Eustachian tube dysfunction   ? Fatigue   ? GERD (gastroesophageal reflux disease)   ? Glioma ([place])   ? low graded tectal plate glioma  ? Hemorrhoids   ? History of sleep apnea   ? no cpap used last 2 years, normal sleep study 2 yrs ago  ? HSV infection   ? Hypothyroid   ? Hypothyroidism   ? IIH (idiopathic intracranial hypertension)   ? Lumbar disc herniation   ? Migraine   ? Neck stiffness   ? Nystagmus, positional, central type 03/28/2013  ? Other malaise and fatigue 03/28/2013  ? " The patient reports feeling happy, ED, sore" on she has undergone at Chiari  malformation surgery decompression in 2011. Prior to the surgery were chief complaints were weakness numbness and neck problems. She had swallowing difficulties and dizziness;  Classic Chiari presentation. But she didn't have headaches.  ? Photophobia   ? Pre-diabetes   ? Tinnitus of both ears   ? Urinary incontinence   ? Vision disturbance   ? Vitamin D deficiency   ? Weakness   ? Wears glasses   ? ? ?Past Surgical History:  ?Procedure Laterality Date  ? ARNOLD CHIARI REPAIR  2010  ? Brain shunt  12/30/2019  ? vp shunt  ? BRAIN SURGERY    ? BREAST BIOPSY Left   ? lumpectomy age 56   ? BREAST EXCISIONAL BIOPSY Right yrs ago  ? CHOLECYSTECTOMY  age 45  ? laparoscopic  ? colonscopy  2012, 2018  ? DILATION AND CURETTAGE OF UTERUS  last done yrs ago  ? x 2 or 3  ? ENDOMETRIAL ABLATION  02/09/2013  ? [facility]  ? ESOPHAGOGASTRODUODENOSCOPY N/A 02/05/2021  ? Procedure: ESOPHAGOGASTRODUODENOSCOPY (EGD);  Surgeon: Carol Ada, MD;  Location: [facility];  Service: Endoscopy;  Laterality: N/A;  ? HYSTEROSCOPY WITH D & C N/A 06/26/2020  ? Procedure: DILATATION AND CURETTAGE /HYSTEROSCOPY;  Surgeon: Salvadore Dom, MD;  Location: [facility];  Service: Gynecology;  Laterality: N/A;  ? INTRAUTERINE DEVICE INSERTION  10/2010  ? Mirena  ? IUD REMOVAL  11/2010  ? could not tolerate hormonal side effects  ? metal plate removed from head  2015  ? screw came loose  ? neck fusion c1-c3  2010  ? OPERATIVE ULTRASOUND N/A 06/26/2020  ? Procedure: OPERATIVE ULTRASOUND;  Surgeon: Salvadore Dom, MD;  Location: [facility];  Service: Gynecology;  Laterality: N/A;  ? TONSILLECTOMY AND ADENOIDECTOMY  age 37  ? UPPER GI ENDOSCOPY  2012, 2018  ? ? ?Current Outpatient Medications  ?Medication Sig Dispense Refill  ? acyclovir (ZOVIRAX) 400 MG tablet Take by mouth.    ? ADVAIR DISKUS 500-50 MCG/ACT AEPB Inhale 1 puff into the lungs 2 (two) times daily.    ? albuterol (VENTOLIN HFA) 108 (90 Base) MCG/ACT  inhaler Inhale 1-2 puffs into the lungs every 4 (four) hours as needed for wheezing or shortness of breath.    ? ALPRAZolam (XANAX) 0.5 MG tablet Take by mouth.    ? azithromycin (ZITHROMAX) 250 MG tablet TAKE 2 TABLETS BY MOUTH TODAY, THEN TAKE 1 TABLET DAILY FOR 4 DAYS    ? busPIRone (BUSPAR) 5 MG tablet     ? busPIRone (BUSPAR) 7.5 MG tablet Take 1 tablet (7.5 mg total) by mouth 2 (two) times daily. 60 tablet 5  ? chlorhexidine (PERIDEX) 0.12 %  solution SMARTSIG:By Mouth    ? cyclobenzaprine (FLEXERIL) 10 MG tablet SMARTSIG:1 By Mouth    ? fluticasone (FLONASE) 50 MCG/ACT nasal spray Place into both nostrils.    ? gabapentin (NEURONTIN) 300 MG capsule Take by mouth.    ? lamoTRIgine (LAMICTAL) 25 MG tablet Take 1 tablet by mouth daily.    ? metFORMIN (GLUCOPHAGE-XR) 500 MG 24 hr tablet Take 500 mg by mouth daily.    ? ondansetron (ZOFRAN) 4 MG tablet Take 1 tablet (4 mg total) by mouth every 8 (eight) hours as needed for nausea or vomiting. 30 tablet 0  ? Oxycodone HCl 10 MG TABS Take 10 mg by mouth every 4 (four) hours as needed (pain.).     ? pantoprazole (PROTONIX) 40 MG tablet Take 1 tablet (40 mg total) by mouth 2 (two) times daily. 60 tablet 1  ? promethazine (PHENERGAN) 25 MG tablet Take 1 tablet (25 mg total) by mouth every 6 (six) hours as needed for nausea. Nausea 30 tablet 1  ? rizatriptan (MAXALT-MLT) 10 MG disintegrating tablet PLEASE SEE ATTACHED FOR DETAILED DIRECTIONS    ? sertraline (ZOLOFT) 25 MG tablet SMARTSIG:1 Tablet(s) By Mouth    ? SYNTHROID 137 MCG tablet Take 137 mcg by mouth every morning.    ? UNABLE TO FIND Take 1 tablet by mouth See admin instructions. Cbd gummies 1 in am ?Cbd sleep gummy at hs    ? valACYclovir (VALTREX) 1000 MG tablet TAKE 2 TABS (2 GRAMS) BY MOUTH NOW AND REPEAT IN 12 HOURS X 1, AS NEEDED FOR [place] 30 tablet 1  ? ?No current facility-administered medications for this visit.  ?  ? ?ALLERGIES: Other, Prednisone, and Morphine and related ? ?Family History   ?Problem Relation Age of Onset  ? Aortic dissection Father   ? Diabetes Maternal Aunt   ? Breast cancer Maternal Aunt   ?     40's  ? Diabetes Maternal Uncle   ? Breast cancer Paternal Aunt 70  ? Diabetes Maternal Grandmother

## 2021-05-31 ENCOUNTER — Other Ambulatory Visit: Payer: Self-pay

## 2021-05-31 ENCOUNTER — Ambulatory Visit (INDEPENDENT_AMBULATORY_CARE_PROVIDER_SITE_OTHER): Payer: PPO | Admitting: Clinical

## 2021-05-31 DIAGNOSIS — F4321 Adjustment disorder with depressed mood: Secondary | ICD-10-CM | POA: Diagnosis not present

## 2021-05-31 NOTE — Progress Notes (Signed)
? ?  THERAPIST PROGRESS NOTE ? ?Session Time: 8am ? ?Participation Level: Active ? ?Behavioral Response: NeatAlert"up and down" ? ?Type of Therapy: Individual Therapy ? ?Treatment Goals addressed: Healthy coping methods ? ?ProgressTowards Goals: Progressing ? ?Interventions: Supportive ?Virtual Visit via Video Note ? ?I connected with April Meyer on 05/31/21 at  8:00 AM EDT by a video enabled telemedicine application and verified that I am speaking with the correct person using two identifiers. ? ?Location: ?Patient: home ?Provider: office ?  ?I discussed the limitations of evaluation and management by telemedicine and the availability of in person appointments. The patient expressed understanding and agreed to proceed. ?  ?I discussed the assessment and treatment plan with the patient. The patient was provided an opportunity to ask questions and all were answered. The patient agreed with the plan and demonstrated an understanding of the instructions. ?  ?The patient was advised to call back or seek an in-person evaluation if the symptoms worsen or if the condition fails to improve as anticipated. ? ?I provided 40 minutes of non-face-to-face time during this encounter.  ?Summary: Pt says she has been attending attending support group of and is planning to raise awareness about youth and fentanyl overdose. Pt says her deceased son was battling drug addiction. Pt states she will be interviewed by the news to raise awareness about mental health and fentanyl overdose. Pt describes her mood as "up and down" at times but says she has a strong support system and they go on an outing 1x per month. Next month pt says she is looking forward to going to the beach. CSW assessed for changes in mood, behavior and daily functioning. Assisted pt in identifying  diversion to manage stressor in healthy manner such as listening to uplifting music and/or podcast. Encouraged pt to continue staying connected to her support system and riding  her bike daily for 20 minutes. ? ?Suicidal/Homicidal: Pt denies SI/HI no plan, intent or attempt to harm self or others reported. Pt encouraged to call 911 or go to closest ED in an emergency. ? ?Plan: Return again in 2 weeks. ? ?Diagnosis: Adjustment disorder with depressed mood ?  Grief ? ?Collaboration of Care: Other none requested ? ?Patient/Guardian was advised Release of Information must be obtained prior to any record release in order to collaborate their care with an outside provider. Patient/Guardian was advised if they have not already done so to contact the registration department to sign all necessary forms in order for [place] to release information regarding their care.  ? ?Consent: Patient/Guardian gives verbal consent for treatment and assignment of benefits for services provided during this visit. Patient/Guardian expressed understanding and agreed to proceed.  ? ?Falecia Vannatter Lynelle Smoke, LCSW ?05/31/2021 ? ?

## 2021-06-03 NOTE — Telephone Encounter (Signed)
Patient is scheduled 09/18/21 with Dr. Wannetta Sender. ?

## 2021-06-04 ENCOUNTER — Ambulatory Visit (HOSPITAL_COMMUNITY): Payer: PPO | Admitting: Psychiatry

## 2021-06-04 ENCOUNTER — Other Ambulatory Visit: Payer: Self-pay

## 2021-06-04 DIAGNOSIS — F325 Major depressive disorder, single episode, in full remission: Secondary | ICD-10-CM | POA: Diagnosis not present

## 2021-06-04 MED ORDER — SERTRALINE HCL 25 MG PO TABS: 50.0000 mg | ORAL_TABLET | Freq: Every morning | ORAL | 4 refills | Status: DC

## 2021-06-04 NOTE — Progress Notes (Signed)
Psychiatric Initial Adult Assessment  ? ?Patient Identification: April Meyer ?MRN:  [id number] ?Date of Evaluation:  06/04/2021 ?Referral Source: ?Chief Complaint:   ?Visit Diagnosis: Major depression ? ?History of Present Illne ? ?Today the patient is doing very well.  She is very resilient.  She is smiling and pleasant tells me that she has a lot of medical problems that she is going to deal with.  These are mainly involving her bladder in her vaginal area.  That she is going to see a specialist right after this session.  The patient shares she has has other medical problems as well.  She has had what sounds like blood clots in the past.  She also says her left shoulder comes out of socket often.  Ironically she says the best part of herself is her brain.  She believes the Zoloft has been very helpful for her.  The increase from 25-50 has made a difference.  She could not tolerate the Belsomra but actually she is sleeping pretty well with or without any sleeping medicine.  She has hospice support therapist that talks to her and she does pretty well.  The patient feels very stable.  She denies abuse of alcohol or drugs. ? ?Associated Signs/Symptoms: ?Depression Symptoms:  depressed mood, ?(Hypo) Manic Symptoms:     ?Anxiety Symptoms:     ?Psychotic Symptoms:     ?PTSD Symptoms: ?Negative ? ?Past Psychiatric History: Zoloft ? ?Previous Psychotropic Medications: Yes  ? ?Substance Abuse History in the last 12 months:  Yes.   ? ?Consequences of Substance Abuse: ?NA ? ?Past Medical History:  ?Past Medical History:  ?Diagnosis Date  ? ADHD (attention deficit hyperactivity disorder)   ? no meds for  ? Anxiety   ? Arnold-Chiari deformity (HCC)   ? Asthma   ? AVM (arteriovenous malformation) spine 04/21/2018  ? Bruises easily   ? Cerebral venous sinus thrombosis, remote, resolved 03/28/2013  ? Chronic pain   ? Common migraine with intractable migraine 04/21/2018  ? Complication of anesthesia age 32   ? breast lumpectomy heart  rate went way down in hospital stayed 2 days, no problems since  ? Complication of anesthesia   ? C 1 to C 3 fusion side to side and up and down limited   ? EDS (Ehlers-Danlos syndrome)   ? Eustachian tube dysfunction   ? Fatigue   ? GERD (gastroesophageal reflux disease)   ? Glioma ([place])   ? low graded tectal plate glioma  ? Hemorrhoids   ? History of sleep apnea   ? no cpap used last 2 years, normal sleep study 2 yrs ago  ? HSV infection   ? Hypothyroid   ? Hypothyroidism   ? IIH (idiopathic intracranial hypertension)   ? Lumbar disc herniation   ? Migraine   ? Neck stiffness   ? Nystagmus, positional, central type 03/28/2013  ? Other malaise and fatigue 03/28/2013  ? " The patient reports feeling happy, ED, sore" on she has undergone at Chiari malformation surgery decompression in 2011. Prior to the surgery were chief complaints were weakness numbness and neck problems. She had swallowing difficulties and dizziness;  Classic Chiari presentation. But she didn't have headaches.  ? Photophobia   ? Pre-diabetes   ? Tinnitus of both ears   ? Urinary incontinence   ? Vision disturbance   ? Vitamin D deficiency   ? Weakness   ? Wears glasses   ?  ?Past Surgical History:  ?Procedure Laterality Date  ?  ARNOLD CHIARI REPAIR  2010  ? Brain shunt  12/30/2019  ? vp shunt  ? BRAIN SURGERY    ? BREAST BIOPSY Left   ? lumpectomy age 58   ? BREAST EXCISIONAL BIOPSY Right yrs ago  ? CHOLECYSTECTOMY  age 56  ? laparoscopic  ? colonscopy  2012, 2018  ? DILATION AND CURETTAGE OF UTERUS  last done yrs ago  ? x 2 or 3  ? ENDOMETRIAL ABLATION  02/09/2013  ? [facility]  ? ESOPHAGOGASTRODUODENOSCOPY N/A 02/05/2021  ? Procedure: ESOPHAGOGASTRODUODENOSCOPY (EGD);  Surgeon: Carol Ada, MD;  Location: [facility];  Service: Endoscopy;  Laterality: N/A;  ? HYSTEROSCOPY WITH D & C N/A 06/26/2020  ? Procedure: DILATATION AND CURETTAGE /HYSTEROSCOPY;  Surgeon: Salvadore Dom, MD;  Location: [facility];  Service: Gynecology;   Laterality: N/A;  ? INTRAUTERINE DEVICE INSERTION  10/2010  ? Mirena  ? IUD REMOVAL  11/2010  ? could not tolerate hormonal side effects  ? metal plate removed from head  2015  ? screw came loose  ? neck fusion c1-c3  2010  ? OPERATIVE ULTRASOUND N/A 06/26/2020  ? Procedure: OPERATIVE ULTRASOUND;  Surgeon: Salvadore Dom, MD;  Location: [facility];  Service: Gynecology;  Laterality: N/A;  ? TONSILLECTOMY AND ADENOIDECTOMY  age 62  ? UPPER GI ENDOSCOPY  2012, 2018  ? ? ?Family Psychiatric History:  ? ?Family History:  ?Family History  ?Problem Relation Age of Onset  ? Aortic dissection Father   ? Diabetes Maternal Aunt   ? Breast cancer Maternal Aunt   ?     40's  ? Diabetes Maternal Uncle   ? Breast cancer Paternal Aunt 105  ? Diabetes Maternal Grandmother   ? Cancer Maternal Grandmother   ?     SKIN AND LUNG  ? Diabetes Paternal Grandmother   ? Breast cancer Maternal Aunt   ?     23's  ? Breast cancer Maternal Aunt   ?     44's  ? ? ?Social History:   ?Social History  ? ?Socioeconomic History  ? Marital status: Divorced  ?  Spouse name: Not on file  ? Number of children: 1  ? Years of education: 15  ? Highest education level: Not on file  ?Occupational History  ?  Employer: OTHER  ?Tobacco Use  ? Smoking status: Former  ?  Packs/day: 2.00  ?  Years: 21.00  ?  Pack years: 42.00  ?  Types: Cigarettes  ?  Quit date: 09/09/2007  ?  Years since quitting: 13.7  ? Smokeless tobacco: Never  ?Vaping Use  ? Vaping Use: Never used  ?Substance and Sexual Activity  ? Alcohol use: No  ?  Comment: h/o binge drinking none in 10 years  ? Drug use: Yes  ?  Types: Marijuana  ?  Comment: daily  use of marijuana recently  ? Sexual activity: Yes  ?  Partners: Male  ?  Birth control/protection: Other-see comments  ?  Comment: Vasectomy-1st intercourse 56 yo-More than 5 partners  ?Other Topics Concern  ? Not on file  ?Social History Narrative  ? Patient is divorced and lives at home with her one child.  ? Patient is  disabled.  ? Patient is right-handed.  ? Patient has a college education.  ? Patient drinks one cup of coffee daily.  ? ?Social Determinants of Health  ? ?Financial Resource Strain: Not on file  ?Food Insecurity: Not on file  ?Transportation  Needs: Not on file  ?Physical Activity: Not on file  ?Stress: Not on file  ?Social Connections: Not on file  ? ? ?Additional Social History:  ? ?Allergies:   ?Allergies  ?Allergen Reactions  ? Other Anaphylaxis  ?  Steroids and Zucchini   ? Prednisone Anaphylaxis  ? Morphine And Related Itching  ?  Severe  ? ? ?Metabolic Disorder Labs: ?Lab Results  ?Component Value Date  ? HGBA1C 5.1 01/30/2021  ? MPG 99.67 01/30/2021  ? MPG 114 06/12/2020  ? ?No results found for: PROLACTIN ?No results found for: CHOL, TRIG, HDL, CHOLHDL, VLDL, LDLCALC ?Lab Results  ?Component Value Date  ? TSH 16.268 (H) 01/30/2021  ? ? ?Therapeutic Level Labs: ?No results found for: LITHIUM ?No results found for: CBMZ ?No results found for: VALPROATE ? ?Current Medications: ?Current Outpatient Medications  ?Medication Sig Dispense Refill  ? acyclovir (ZOVIRAX) 400 MG tablet Take by mouth.    ? ADVAIR DISKUS 500-50 MCG/ACT AEPB Inhale 1 puff into the lungs 2 (two) times daily.    ? albuterol (VENTOLIN HFA) 108 (90 Base) MCG/ACT inhaler Inhale 1-2 puffs into the lungs every 4 (four) hours as needed for wheezing or shortness of breath.    ? ALPRAZolam (XANAX) 0.5 MG tablet Take by mouth.    ? azithromycin (ZITHROMAX) 250 MG tablet TAKE 2 TABLETS BY MOUTH TODAY, THEN TAKE 1 TABLET DAILY FOR 4 DAYS    ? busPIRone (BUSPAR) 5 MG tablet     ? busPIRone (BUSPAR) 7.5 MG tablet Take 1 tablet (7.5 mg total) by mouth 2 (two) times daily. 60 tablet 5  ? chlorhexidine (PERIDEX) 0.12 % solution SMARTSIG:By Mouth    ? cyclobenzaprine (FLEXERIL) 10 MG tablet SMARTSIG:1 By Mouth    ? fluticasone (FLONASE) 50 MCG/ACT nasal spray Place into both nostrils.    ? gabapentin (NEURONTIN) 300 MG capsule Take by mouth.    ? lamoTRIgine  (LAMICTAL) 25 MG tablet Take 1 tablet by mouth daily.    ? metFORMIN (GLUCOPHAGE-XR) 500 MG 24 hr tablet Take 500 mg by mouth daily.    ? ondansetron (ZOFRAN) 4 MG tablet Take 1 tablet (4 mg total) by mouth

## 2021-06-10 DIAGNOSIS — I83813 Varicose veins of bilateral lower extremities with pain: Secondary | ICD-10-CM | POA: Insufficient documentation

## 2021-06-18 ENCOUNTER — Ambulatory Visit (INDEPENDENT_AMBULATORY_CARE_PROVIDER_SITE_OTHER): Payer: PPO | Admitting: Clinical

## 2021-06-18 DIAGNOSIS — F4321 Adjustment disorder with depressed mood: Secondary | ICD-10-CM | POA: Diagnosis not present

## 2021-06-18 NOTE — Progress Notes (Signed)
? ?  THERAPIST PROGRESS NOTE ? ?Session Time: 1pm ? ?Participation Level: Active ? ?Behavioral Response: CasualAlert ? ?Type of Therapy: Individual Therapy ? ?Treatment Goals addressed: healthy coping ? ?ProgressTowards Goals: Progressing ? ?Interventions: Supportive ? ?Summary: final session today. Pt states she is not feeling well, has a stomach virus. Pt has been referred to [employer] clinician) to resume treatment. [name], television/film/video pt list of community mental health providers and encouraged to contact her insurance carrier or psychology today for additional resources. Pt says she is agreeable to do so and in agreement with referral to [employer]. Pt is actively participating in grief support group and has supportive family members and friends. ? ?Suicidal/Homicidal: No psychotic sxs reported. Pt denies SI/HI no plan intent or attempt to harm self or others reported. ? ?Diagnosis: adjustment disorder with depressed mood ?  Grief  ? ?Collaboration of Care: Other referred to [name] for individual therapy. ? ?Patient/Guardian was advised Release of Information must be obtained prior to any record release in order to collaborate their care with an outside provider. Patient/Guardian was advised if they have not already done so to contact the registration department to sign all necessary forms in order for [place] to release information regarding their care.  ? ?Consent: Patient/Guardian gives verbal consent for treatment and assignment of benefits for services provided during this visit. Patient/Guardian expressed understanding and agreed to proceed.  ? ?April Cumbo Lynelle Smoke, LCSW ?06/18/2021 ? ?

## 2021-06-20 ENCOUNTER — Other Ambulatory Visit (HOSPITAL_COMMUNITY): Payer: Self-pay | Admitting: Psychiatry

## 2021-07-15 DIAGNOSIS — I872 Venous insufficiency (chronic) (peripheral): Secondary | ICD-10-CM | POA: Insufficient documentation

## 2021-08-06 ENCOUNTER — Ambulatory Visit (HOSPITAL_COMMUNITY): Payer: PPO | Admitting: Psychiatry

## 2021-08-06 DIAGNOSIS — F325 Major depressive disorder, single episode, in full remission: Secondary | ICD-10-CM

## 2021-08-06 MED ORDER — SERTRALINE HCL 25 MG PO TABS: 50.0000 mg | ORAL_TABLET | Freq: Every morning | ORAL | 2 refills | Status: DC

## 2021-08-06 NOTE — Progress Notes (Signed)
Psychiatric Initial Adult Assessment   Patient Identification: April Meyer MRN:  [id number] Date of Evaluation:  08/06/2021 Referral Source: Chief Complaint:   Visit Diagnosis: Major depression  History of Present Illne  Today the patient is doing very well.  She is doing the same as she was doing last time which is that she is feeling good.  Her physical symptoms are less of a problem.  It is noted that she has significant migraine headaches which are helped by a shunt and some medicines.  This is a patient who is going through a grieving since the death of her only son 8 years old.  He died of Fentanyl overdose.  Presently the patient goes to hospice in a grieving process.  She also has a number of groups.  She is doing everything she can to deal with things.  Her husband is also in a rehab process and comes home just 1 daily.  Despite all these stresses the patient says her mood is stable.  She is sleeping and eating well.  She herself never uses alcohol or drugs.  She tries to read a little.  She watches TV and likes music.  Overall she is medically stable and psychiatrically she is coping actually quite well.  She believes the Zoloft 50 mg is helpful.  Associated Signs/Symptoms: Depression Symptoms:  depressed mood, (Hypo) Manic Symptoms:     Anxiety Symptoms:     Psychotic Symptoms:     PTSD Symptoms: Negative  Past Psychiatric History: Zoloft  Previous Psychotropic Medications: Yes   Substance Abuse History in the last 12 months:  Yes.    Consequences of Substance Abuse: NA  Past Medical History:  Past Medical History:  Diagnosis Date   ADHD (attention deficit hyperactivity disorder)    no meds for   Anxiety    Arnold-Chiari deformity (HCC)    Asthma    AVM (arteriovenous malformation) spine 04/21/2018   Bruises easily    Cerebral venous sinus thrombosis, remote, resolved 03/28/2013   Chronic pain    Common migraine with intractable migraine 06/16/8117   Complication of  anesthesia age 10    breast lumpectomy heart rate went way down in hospital stayed 2 days, no problems since   Complication of anesthesia    C 1 to C 3 fusion side to side and up and down limited    EDS (Ehlers-Danlos syndrome)    Eustachian tube dysfunction    Fatigue    GERD (gastroesophageal reflux disease)    Glioma (HCC)    low graded tectal plate glioma   Hemorrhoids    History of sleep apnea    no cpap used last 2 years, normal sleep study 2 yrs ago   HSV infection    Hypothyroid    Hypothyroidism    IIH (idiopathic intracranial hypertension)    Lumbar disc herniation    Migraine    Neck stiffness    Nystagmus, positional, central type 03/28/2013   Other malaise and fatigue 03/28/2013   " The patient reports feeling happy, ED, sore" on she has undergone at Chiari malformation surgery decompression in 2011. Prior to the surgery were chief complaints were weakness numbness and neck problems. She had swallowing difficulties and dizziness;  Classic Chiari presentation. But she didn't have headaches.   Photophobia    Pre-diabetes    Tinnitus of both ears    Urinary incontinence    Vision disturbance    Vitamin D deficiency    Weakness  Wears glasses     Past Surgical History:  Procedure Laterality Date   ARNOLD CHIARI REPAIR  2010   Brain shunt  12/30/2019   vp shunt   BRAIN SURGERY     BREAST BIOPSY Left    lumpectomy age 36    BREAST EXCISIONAL BIOPSY Right yrs ago   CHOLECYSTECTOMY  age 55   laparoscopic   colonscopy  2012, 2018   DILATION AND CURETTAGE OF UTERUS  last done yrs ago   x 2 or 3   ENDOMETRIAL ABLATION  02/09/2013   [facility]   ESOPHAGOGASTRODUODENOSCOPY N/A 02/05/2021   Procedure: ESOPHAGOGASTRODUODENOSCOPY (EGD);  Surgeon: Carol Ada, MD;  Location: [facility];  Service: Endoscopy;  Laterality: N/A;   HYSTEROSCOPY WITH D & C N/A 06/26/2020   Procedure: DILATATION AND CURETTAGE /HYSTEROSCOPY;  Surgeon: Salvadore Dom, MD;  Location:  [place];  Service: Gynecology;  Laterality: N/A;   INTRAUTERINE DEVICE INSERTION  10/2010   Mirena   IUD REMOVAL  11/2010   could not tolerate hormonal side effects   metal plate removed from head  2015   screw came loose   neck fusion c1-c3  2010   OPERATIVE ULTRASOUND N/A 06/26/2020   Procedure: OPERATIVE ULTRASOUND;  Surgeon: Salvadore Dom, MD;  Location: [facility];  Service: Gynecology;  Laterality: N/A;   TONSILLECTOMY AND ADENOIDECTOMY  age 41   UPPER GI ENDOSCOPY  2012, 2018    Family Psychiatric History:   Family History:  Family History  Problem Relation Age of Onset   Aortic dissection Father    Diabetes Maternal Aunt    Breast cancer Maternal Aunt        40's   Diabetes Maternal Uncle    Breast cancer Paternal Aunt 24   Diabetes Maternal Grandmother    Cancer Maternal Grandmother        SKIN AND LUNG   Diabetes Paternal Grandmother    Breast cancer Maternal Aunt        50's   Breast cancer Maternal Aunt        50's    Social History:   Social History   Socioeconomic History   Marital status: Divorced    Spouse name: Not on file   Number of children: 1   Years of education: 16   Highest education level: Not on file  Occupational History    Employer: OTHER  Tobacco Use   Smoking status: Former    Packs/day: 2.00    Years: 21.00    Pack years: 42.00    Types: Cigarettes    Quit date: 09/09/2007    Years since quitting: 13.9   Smokeless tobacco: Never  Vaping Use   Vaping Use: Never used  Substance and Sexual Activity   Alcohol use: No    Comment: h/o binge drinking none in 10 years   Drug use: Yes    Types: Marijuana    Comment: daily  use of marijuana recently   Sexual activity: Yes    Partners: Male    Birth control/protection: Other-see comments    Comment: Vasectomy-1st intercourse 56 yo-More than 5 partners  Other Topics Concern   Not on file  Social History Narrative   Patient is divorced and  lives at home with her one child.   Patient is disabled.   Patient is right-handed.   Patient has a college education.   Patient drinks one cup of coffee daily.   Social Determinants of Health  Financial Resource Strain: Not on file  Food Insecurity: Not on file  Transportation Needs: Not on file  Physical Activity: Not on file  Stress: Not on file  Social Connections: Not on file    Additional Social History:   Allergies:   Allergies  Allergen Reactions   Other Anaphylaxis    Steroids and Zucchini    Prednisone Anaphylaxis   Morphine And Related Itching    Severe    Metabolic Disorder Labs: Lab Results  Component Value Date   HGBA1C 5.1 01/30/2021   MPG 99.67 01/30/2021   MPG 114 06/12/2020   No results found for: PROLACTIN No results found for: CHOL, TRIG, HDL, CHOLHDL, VLDL, LDLCALC Lab Results  Component Value Date   TSH 16.268 (H) 01/30/2021    Therapeutic Level Labs: No results found for: LITHIUM No results found for: CBMZ No results found for: VALPROATE  Current Medications: Current Outpatient Medications  Medication Sig Dispense Refill   acyclovir (ZOVIRAX) 400 MG tablet Take by mouth.     ADVAIR DISKUS 500-50 MCG/ACT AEPB Inhale 1 puff into the lungs 2 (two) times daily.     albuterol (VENTOLIN HFA) 108 (90 Base) MCG/ACT inhaler Inhale 1-2 puffs into the lungs every 4 (four) hours as needed for wheezing or shortness of breath.     ALPRAZolam (XANAX) 0.5 MG tablet Take by mouth.     azithromycin (ZITHROMAX) 250 MG tablet TAKE 2 TABLETS BY MOUTH TODAY, THEN TAKE 1 TABLET DAILY FOR 4 DAYS     busPIRone (BUSPAR) 5 MG tablet      busPIRone (BUSPAR) 7.5 MG tablet Take 1 tablet (7.5 mg total) by mouth 2 (two) times daily. 60 tablet 5   chlorhexidine (PERIDEX) 0.12 % solution SMARTSIG:By Mouth     cyclobenzaprine (FLEXERIL) 10 MG tablet SMARTSIG:1 By Mouth     fluticasone (FLONASE) 50 MCG/ACT nasal spray Place into both nostrils.     gabapentin  (NEURONTIN) 300 MG capsule Take by mouth.     lamoTRIgine (LAMICTAL) 25 MG tablet Take 1 tablet by mouth daily.     metFORMIN (GLUCOPHAGE-XR) 500 MG 24 hr tablet Take 500 mg by mouth daily.     ondansetron (ZOFRAN) 4 MG tablet Take 1 tablet (4 mg total) by mouth every 8 (eight) hours as needed for nausea or vomiting. 30 tablet 0   Oxycodone HCl 10 MG TABS Take 10 mg by mouth every 4 (four) hours as needed (pain.).      pantoprazole (PROTONIX) 40 MG tablet Take 1 tablet (40 mg total) by mouth 2 (two) times daily. 60 tablet 1   promethazine (PHENERGAN) 25 MG tablet Take 1 tablet (25 mg total) by mouth every 6 (six) hours as needed for nausea. Nausea 30 tablet 1   rizatriptan (MAXALT-MLT) 10 MG disintegrating tablet PLEASE SEE ATTACHED FOR DETAILED DIRECTIONS     sertraline (ZOLOFT) 25 MG tablet Take 2 tablets (50 mg total) by mouth AC breakfast. 90 tablet 2   SYNTHROID 137 MCG tablet Take 137 mcg by mouth every morning.     UNABLE TO FIND Take 1 tablet by mouth See admin instructions. Cbd gummies 1 in am Cbd sleep gummy at hs     valACYclovir (VALTREX) 1000 MG tablet TAKE 2 TABS (2 GRAMS) BY MOUTH NOW AND REPEAT IN 12 HOURS X 1, AS NEEDED FOR [place] 30 tablet 1   No current facility-administered medications for this visit.    Musculoskeletal: Strength & Muscle Tone: within normal limits Gait & Station:  normal Patient leans: Right  Psychiatric Specialty Exam: Review of Systems  Last menstrual period 08/10/2020.There is no height or weight on file to calculate BMI.  General Appearance: Negative  Eye Contact:  Good  Speech:  Negative  Volume:  Normal  Mood:  Negative  Affect:  Congruent  Thought Process:  Goal Directed  Orientation:  Full (Time, Place, and Person)  Thought Content:  Logical  Suicidal Thoughts:  No  Homicidal Thoughts:  No  Memory:  NA  Judgement:  Good  Insight:  Good  Psychomotor Activity:  Normal  Concentration:  Concentration: Good  Recall:  Good  Fund of  Knowledge:Good  Language: Good  Akathisia:  No  Handed:  Right  AIMS (if indicated):  not done  Assets:  Desire for Improvement  ADL's:  Intact  Cognition: WNL  Sleep:  Good   Screenings: PHQ2-9    Flowsheet Row Counselor from 02/18/2021 in [place]  PHQ-2 Total Score 3  PHQ-9 Total Score 11      Flowsheet Row Counselor from 02/18/2021 in [place] ED to Hosp-Admission (Discharged) from 01/30/2021 in [place] Admission (Discharged) from 06/26/2020 in [place] No Risk Error: Question 1 not populated No Risk       Assessment and Plan:  Major depression in remission.  The patient is doing well.  She is dealing with grief with one-to-one therapy as well as groups.  She takes 50 mg of Zoloft and has no side effects but clearly feels much better.  She is functioning well and will return to see me in 5 months.   Jerral Ralph, MD 5/30/20234:38 PM

## 2021-08-15 ENCOUNTER — Ambulatory Visit (HOSPITAL_COMMUNITY): Payer: PPO | Admitting: Licensed Clinical Social Worker

## 2021-08-22 ENCOUNTER — Other Ambulatory Visit (HOSPITAL_BASED_OUTPATIENT_CLINIC_OR_DEPARTMENT_OTHER): Payer: Self-pay

## 2021-08-22 MED ORDER — OXYCODONE HCL 10 MG PO TABS
ORAL_TABLET | ORAL | 0 refills | Status: DC
  Filled 2021-10-03: qty 120, 30d supply, fill #0

## 2021-08-22 MED ORDER — OXYCODONE HCL 10 MG PO TABS
ORAL_TABLET | ORAL | 0 refills | Status: DC
  Filled 2021-08-30: qty 120, 30d supply, fill #0

## 2021-08-30 ENCOUNTER — Other Ambulatory Visit (HOSPITAL_BASED_OUTPATIENT_CLINIC_OR_DEPARTMENT_OTHER): Payer: Self-pay

## 2021-09-02 ENCOUNTER — Other Ambulatory Visit (HOSPITAL_BASED_OUTPATIENT_CLINIC_OR_DEPARTMENT_OTHER): Payer: Self-pay

## 2021-09-03 ENCOUNTER — Other Ambulatory Visit (HOSPITAL_BASED_OUTPATIENT_CLINIC_OR_DEPARTMENT_OTHER): Payer: Self-pay

## 2021-09-18 ENCOUNTER — Encounter: Payer: Self-pay | Admitting: Obstetrics and Gynecology

## 2021-09-18 ENCOUNTER — Ambulatory Visit: Payer: PPO | Admitting: Obstetrics and Gynecology

## 2021-09-18 VITALS — BP 131/85 | HR 79 | Ht 66.0 in | Wt 223.0 lb

## 2021-09-18 DIAGNOSIS — N812 Incomplete uterovaginal prolapse: Secondary | ICD-10-CM | POA: Diagnosis not present

## 2021-09-18 DIAGNOSIS — R350 Frequency of micturition: Secondary | ICD-10-CM | POA: Diagnosis not present

## 2021-09-18 DIAGNOSIS — N8110 Cystocele, unspecified: Secondary | ICD-10-CM | POA: Diagnosis not present

## 2021-09-18 DIAGNOSIS — N393 Stress incontinence (female) (male): Secondary | ICD-10-CM | POA: Diagnosis not present

## 2021-09-18 LAB — POCT URINALYSIS DIPSTICK
Bilirubin, UA: NEGATIVE
Blood, UA: NEGATIVE
Glucose, UA: NEGATIVE
Ketones, UA: NEGATIVE
Leukocytes, UA: NEGATIVE
Nitrite, UA: NEGATIVE
Protein, UA: NEGATIVE
Spec Grav, UA: >=1.03 — AB (ref 1.010–1.025)
Urobilinogen, UA: 0.2 E.U./dL
pH, UA: 5.5 (ref 5.0–8.0)
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
## 2021-09-18 NOTE — Patient Instructions (Signed)

## 2021-09-19 ENCOUNTER — Encounter: Payer: Self-pay | Admitting: Obstetrics and Gynecology

## 2021-09-19 LAB — GLUCOSE, POCT (MANUAL RESULT ENTRY): POC Glucose: 98 mg/dl (ref 70–99)

## 2021-09-19 LAB — POCT GLYCOSYLATED HEMOGLOBIN (HGB A1C): Hemoglobin-A1c: 5.9

## 2021-09-24 ENCOUNTER — Other Ambulatory Visit (HOSPITAL_COMMUNITY): Payer: Self-pay | Admitting: Psychiatry

## 2021-09-30 ENCOUNTER — Other Ambulatory Visit (HOSPITAL_BASED_OUTPATIENT_CLINIC_OR_DEPARTMENT_OTHER): Payer: Self-pay

## 2021-10-02 ENCOUNTER — Ambulatory Visit (INDEPENDENT_AMBULATORY_CARE_PROVIDER_SITE_OTHER): Payer: PPO | Admitting: Obstetrics and Gynecology

## 2021-10-02 VITALS — BP 120/77 | HR 95

## 2021-10-02 DIAGNOSIS — R350 Frequency of micturition: Secondary | ICD-10-CM

## 2021-10-02 DIAGNOSIS — N393 Stress incontinence (female) (male): Secondary | ICD-10-CM

## 2021-10-02 LAB — POCT URINALYSIS DIPSTICK
Appearance: NORMAL
Bilirubin, UA: NEGATIVE
Glucose, UA: NEGATIVE
Leukocytes, UA: NEGATIVE
Nitrite, UA: NEGATIVE
Protein, UA: POSITIVE — AB
Spec Grav, UA: 1.025 (ref 1.010–1.025)
Urobilinogen, UA: 0.2 E.U./dL
pH, UA: 5.5 (ref 5.0–8.0)

## 2021-10-02 NOTE — Patient Instructions (Signed)

## 2021-10-03 ENCOUNTER — Other Ambulatory Visit (HOSPITAL_BASED_OUTPATIENT_CLINIC_OR_DEPARTMENT_OTHER): Payer: Self-pay

## 2021-10-10 NOTE — Progress Notes (Signed)
[facility] Urodynamics Procedure  Referring Physician: Hayden Rasmussen, MD Date of Procedure: 10/02/2021  April Meyer is a 56 y.o. female who presents for urodynamic evaluation. Indication(s) for study: mixed incontinence  Vital Signs: BP 120/77   Pulse 95   LMP 08/10/2020   Laboratory Results: A catheterized urine specimen revealed:  POC urine: positive protein, otherwise negative  Voiding Diary: Not performed  Procedure Timeout:  The correct patient was verified and the correct procedure was verified. The patient was in the correct position and safety precautions were reviewed based on at the patient's history.  Urodynamic Procedure A 416F dual lumen urodynamics catheter was placed under sterile conditions into the patient's bladder. A 416F catheter was placed into the rectum in order to measure abdominal pressure. EMG patches were placed in the appropriate position.  All connections were confirmed and calibrations/adjusted made. Saline was instilled into the bladder through the dual lumen catheters.  Cough/valsalva pressures were measured periodically during filling.  Patient was allowed to void.  The bladder was then emptied of its residual.  UROFLOW: Revealed a Qmax of 8 mL/sec.  She voided 64 mL and had a residual of 2 mL.  It was a normal pattern and represented normal habits though interpretation limited due to low voided volume.  CMG: This was performed with sterile water in the sitting position at a fill rate of 30 mL/min.    First sensation of fullness was 162 mLs,  First urge was 179 mLs,  Strong urge was 264 mLs and  Capacity was 315 mLs  Stress incontinence was demonstrated Lowest positive Barrier CLPP was 93 cmH20 at 302 ml. Lowest positive Barrier VLPP was 92 cmH20 at 302 ml.  Detrusor function was normal, with no phasic contractions seen.    Compliance:  normal. End fill detrusor pressure was -9cmH20.   UPP: MUCP with barrier reduction was 72  cm of water.    MICTURITION STUDY: Voiding was performed with reduction using scopettes in the sitting position.  Pdet at Qmax was 4 cm of water.  Qmax was 11.7 mL/sec.  It was a interrupted pattern- two separate flow patterns.  She voided 314 mL and had a residual of 1 mL.  It was a volitional void, sustained detrusor contraction was present (two separate contractions) and abdominal straining was present  EMG: This was performed with patches.  She had voluntary contractions, recruitment with fill was not present and urethral sphincter was relaxed with void.  The details of the procedure with the study tracings have been scanned into EPIC.   Urodynamic Impression:  1. Sensation was normal; capacity was normal 2. Stress Incontinence was demonstrated at normal pressures; 3. Detrusor Overactivity was not demonstrated. 4. Emptying was dysfunctional with a normal PVR, a sustained detrusor contraction present,  abdominal straining present, normal urethral sphincter activity on EMG.  Plan: - The patient will follow up  to discuss the findings and treatment options.

## 2021-10-11 ENCOUNTER — Encounter: Payer: Self-pay | Admitting: Obstetrics and Gynecology

## 2021-10-11 ENCOUNTER — Ambulatory Visit (INDEPENDENT_AMBULATORY_CARE_PROVIDER_SITE_OTHER): Payer: PPO | Admitting: Obstetrics and Gynecology

## 2021-10-11 VITALS — BP 110/75 | HR 69

## 2021-10-11 DIAGNOSIS — N393 Stress incontinence (female) (male): Secondary | ICD-10-CM | POA: Diagnosis not present

## 2021-10-11 DIAGNOSIS — N812 Incomplete uterovaginal prolapse: Secondary | ICD-10-CM | POA: Diagnosis not present

## 2021-10-11 DIAGNOSIS — N8110 Cystocele, unspecified: Secondary | ICD-10-CM | POA: Diagnosis not present

## 2021-10-11 NOTE — Progress Notes (Signed)
[facility] Return Visit  SUBJECTIVE  History of Present Illness: April Meyer is a 56 y.o. female seen in follow-up after urodynamics.    Urodynamic Impression:  1. Sensation was normal; capacity was normal 2. Stress Incontinence was demonstrated at normal pressures; 3. Detrusor Overactivity was not demonstrated. 4. Emptying was dysfunctional with a normal PVR, a sustained detrusor contraction present, abdominal straining present, normal urethral sphincter activity on EMG.    Past Medical History: Patient  has a past medical history of ADHD (attention deficit hyperactivity disorder), Anxiety, Arnold-Chiari deformity ([place]), Asthma, AVM (arteriovenous malformation) spine (04/21/2018), Bruises easily, Cerebral venous sinus thrombosis, remote, resolved (03/28/2013), Chronic pain, Common migraine with intractable migraine (1/47/8295), Complication of anesthesia (age 66 ), Complication of anesthesia, EDS (Ehlers-Danlos syndrome), Eustachian tube dysfunction, Fatigue, GERD (gastroesophageal reflux disease), Glioma ([place]), Hemorrhoids, History of sleep apnea, HSV infection, Hypothyroid, Hypothyroidism, IIH (idiopathic intracranial hypertension), Lumbar disc herniation, Migraine, Neck stiffness, Nystagmus, positional, central type (03/28/2013), Other malaise and fatigue (03/28/2013), Photophobia, Pre-diabetes, Tinnitus of both ears, Urinary incontinence, Vision disturbance, Vitamin D deficiency, Weakness, and Wears glasses.   Past Surgical History: She  has a past surgical history that includes [place] (2010); Intrauterine device insertion (10/2010); IUD removal (11/2010); neck fusion c1-c3 (2010); Endometrial ablation (02/09/2013); Breast excisional biopsy (Right, yrs ago); Brain shunt (12/30/2019); Breast biopsy (Left); Tonsillectomy and adenoidectomy (age 58); Dilation and curettage of uterus (last done yrs ago); Brain surgery; metal plate removed from head (2015); Cholecystectomy (age  51); colonscopy (2012, 2018); Upper gi endoscopy (2012, 2018); Hysteroscopy with D & C (N/A, 06/26/2020); Operative ultrasound (N/A, 06/26/2020); and Esophagogastroduodenoscopy (N/A, 02/05/2021).   Medications: She has a current medication list which includes the following prescription(s): acyclovir, albuterol, buspirone, buspirone, cyclobenzaprine, fluticasone, gabapentin, lamotrigine, metformin, ondansetron, oxycodone hcl, pantoprazole, promethazine, rizatriptan, sertraline, synthroid, UNABLE TO FIND, and valacyclovir.   Allergies: Patient is allergic to advair hfa [fluticasone-salmeterol], other, prednisone, and morphine and related.   Social History: Patient  reports that she quit smoking about 14 years ago. Her smoking use included cigarettes. She has a 42.00 pack-year smoking history. She has never used smokeless tobacco. She reports current drug use. Drug: Marijuana. She reports that she does not drink alcohol.      OBJECTIVE     Physical Exam: Vitals:   10/11/21 1447  BP: 110/75  Pulse: 69   Gen: No apparent distress, A&O x 3.  Detailed Urogynecologic Evaluation:  Deferred. Prior exam showed:  POP-Q (09/18/21):    POP-Q   0.5                                            Aa   0.5                                           Ba   -5.5                                              C    4  Gh   4.5                                            Pb   10                                            tvl    -2                                            Ap   -2                                            Bp   -8                                              D      ASSESSMENT AND PLAN    April Meyer is a 56 y.o. with:  1. Prolapse of anterior vaginal wall   2. Uterovaginal prolapse, incomplete   3. SUI (stress urinary incontinence, female)      Plan for surgery: Exam under anesthesia, robotic assisted total laparoscopic hysterectomy,  bilateral salpingectomy, sacrocolpopexy, single incision sling, cystoscopy  - We reviewed the patient's specific anatomic and functional findings, with the assistance of diagrams, and together finalized the above procedure. The planned surgical procedures were discussed along with the surgical risks outlined below, which were also provided on a detailed handout. Additional treatment options including expectant management, conservative management, medical management were discussed where appropriate.  We reviewed the benefits and risks of each treatment option.  - We reviewed the results of the urodynamic testing. Due to straining with voiding, would recommend single incision sling to reduce the risk of voiding dysfunction.   - For preop Visit:  She is required to have a visit within 30 days of her surgery.    - Medical clearance: required from Neurosurgery Dr Bridgette Habermann due to intracranial hypertention and sent. Letter sent today requesting risk stratification and medical optimization.  - Anticoagulant use: No - Medicaid Hysterectomy form: n/a - Accepts blood transfusion: Yes - Expected length of stay: outpatient  Request sent for surgery scheduling.   Jaquita Folds, MD

## 2021-10-22 ENCOUNTER — Other Ambulatory Visit (HOSPITAL_BASED_OUTPATIENT_CLINIC_OR_DEPARTMENT_OTHER): Payer: Self-pay

## 2021-10-22 MED ORDER — OXYCODONE HCL 10 MG PO TABS
ORAL_TABLET | ORAL | 0 refills | Status: DC
  Filled 2021-11-04: qty 120, 30d supply, fill #0

## 2021-10-29 NOTE — Progress Notes (Signed)
Sent message, via epic in basket, requesting orders in epic from surgeon.  

## 2021-11-01 ENCOUNTER — Ambulatory Visit (INDEPENDENT_AMBULATORY_CARE_PROVIDER_SITE_OTHER): Payer: PPO | Admitting: Obstetrics and Gynecology

## 2021-11-01 ENCOUNTER — Encounter: Payer: Self-pay | Admitting: Obstetrics and Gynecology

## 2021-11-01 VITALS — BP 112/75 | HR 64 | Ht 67.0 in | Wt 223.0 lb

## 2021-11-01 DIAGNOSIS — N812 Incomplete uterovaginal prolapse: Secondary | ICD-10-CM | POA: Diagnosis not present

## 2021-11-01 DIAGNOSIS — Z01818 Encounter for other preprocedural examination: Secondary | ICD-10-CM

## 2021-11-01 DIAGNOSIS — N8110 Cystocele, unspecified: Secondary | ICD-10-CM | POA: Diagnosis not present

## 2021-11-01 DIAGNOSIS — N393 Stress incontinence (female) (male): Secondary | ICD-10-CM | POA: Diagnosis not present

## 2021-11-01 MED ORDER — IBUPROFEN 600 MG PO TABS: 600.0000 mg | ORAL_TABLET | Freq: Four times a day (QID) | ORAL | 0 refills | Status: AC | PRN

## 2021-11-01 MED ORDER — POLYETHYLENE GLYCOL 3350 17 GM/SCOOP PO POWD: 17.0000 g | Freq: Every day | ORAL | 0 refills | Status: AC

## 2021-11-01 MED ORDER — ACETAMINOPHEN 500 MG PO TABS: 500.0000 mg | ORAL_TABLET | Freq: Four times a day (QID) | ORAL | 0 refills | Status: AC | PRN

## 2021-11-01 NOTE — Progress Notes (Signed)
[facility] Pre-Operative visit  Subjective Chief Complaint: April Meyer presents for a preoperative encounter.   History of Present Illness: April Meyer is a 56 y.o. female who presents for preoperative visit.  She is scheduled to undergo Exam under anesthesia, robotic assisted total laparoscopic hysterectomy, bilateral salpingectomy, sacrocolpopexy, single incision sling, cystoscopy on 11/21/21.  Her symptoms include vaginal bulge, and she was was found to have Stage II anterior, Stage I posterior, Stage I apical prolapse.   Urodynamics showed: 1. Sensation was normal; capacity was normal 2. Stress Incontinence was demonstrated at normal pressures; 3. Detrusor Overactivity was not demonstrated. 4. Emptying was dysfunctional with a normal PVR, a sustained detrusor contraction present, abdominal straining present, normal urethral sphincter activity on EMG.  Past Medical History:  Diagnosis Date   ADHD (attention deficit hyperactivity disorder)    no meds for   Anxiety    Arnold-Chiari deformity (HCC)    Asthma    AVM (arteriovenous malformation) spine 04/21/2018   Bruises easily    Cerebral venous sinus thrombosis, remote, resolved 03/28/2013   Chronic pain    Common migraine with intractable migraine 9/98/3382   Complication of anesthesia age 48    breast lumpectomy heart rate went way down in hospital stayed 2 days, no problems since   Complication of anesthesia    C 1 to C 3 fusion side to side and up and down limited    EDS (Ehlers-Danlos syndrome)    Eustachian tube dysfunction    Fatigue    GERD (gastroesophageal reflux disease)    Glioma (HCC)    low graded tectal plate glioma   Hemorrhoids    History of sleep apnea    no cpap used last 2 years, normal sleep study 2 yrs ago   HSV infection    Hypothyroid    Hypothyroidism    IIH (idiopathic intracranial hypertension)    Lumbar disc herniation    Migraine    Neck stiffness    Nystagmus, positional,  central type 03/28/2013   Other malaise and fatigue 03/28/2013   " The patient reports feeling happy, ED, sore" on she has undergone at Chiari malformation surgery decompression in 2011. Prior to the surgery were chief complaints were weakness numbness and neck problems. She had swallowing difficulties and dizziness;  Classic Chiari presentation. But she didn't have headaches.   Photophobia    Pre-diabetes    Tinnitus of both ears    Urinary incontinence    Vision disturbance    Vitamin D deficiency    Weakness    Wears glasses      Past Surgical History:  Procedure Laterality Date   ARNOLD CHIARI REPAIR  2010   Brain shunt  12/30/2019   vp shunt   BRAIN SURGERY     BREAST BIOPSY Left    lumpectomy age 10    BREAST EXCISIONAL BIOPSY Right yrs ago   CHOLECYSTECTOMY  age 61   laparoscopic   colonscopy  2012, 2018   DILATION AND CURETTAGE OF UTERUS  last done yrs ago   x 2 or 3   ENDOMETRIAL ABLATION  02/09/2013   [facility]   ESOPHAGOGASTRODUODENOSCOPY N/A 02/05/2021   Procedure: ESOPHAGOGASTRODUODENOSCOPY (EGD);  Surgeon: Carol Ada, MD;  Location: [facility];  Service: Endoscopy;  Laterality: N/A;   HYSTEROSCOPY WITH D & C N/A 06/26/2020   Procedure: DILATATION AND CURETTAGE /HYSTEROSCOPY;  Surgeon: Salvadore Dom, MD;  Location: [facility];  Service: Gynecology;  Laterality: N/A;  INTRAUTERINE DEVICE INSERTION  10/2010   Mirena   IUD REMOVAL  11/2010   could not tolerate hormonal side effects   metal plate removed from head  2015   screw came loose   neck fusion c1-c3  2010   OPERATIVE ULTRASOUND N/A 06/26/2020   Procedure: OPERATIVE ULTRASOUND;  Surgeon: Salvadore Dom, MD;  Location: [facility];  Service: Gynecology;  Laterality: N/A;   TONSILLECTOMY AND ADENOIDECTOMY  age 80   UPPER GI ENDOSCOPY  2012, 2018    is allergic to advair hfa [fluticasone-salmeterol], other, prednisone, and morphine and related.   Family History   Problem Relation Age of Onset   Aortic dissection Father    Diabetes Maternal Aunt    Breast cancer Maternal Aunt        40's   Diabetes Maternal Uncle    Breast cancer Paternal Aunt 47   Diabetes Maternal Grandmother    Cancer Maternal Grandmother        SKIN AND LUNG   Diabetes Paternal Grandmother    Breast cancer Maternal Aunt        50's   Breast cancer Maternal Aunt        50's    Social History   Tobacco Use   Smoking status: Former    Packs/day: 2.00    Years: 21.00    Total pack years: 42.00    Types: Cigarettes    Quit date: 09/09/2007    Years since quitting: 14.1   Smokeless tobacco: Never  Vaping Use   Vaping Use: Never used  Substance Use Topics   Alcohol use: No    Comment: h/o binge drinking none in 10 years   Drug use: Yes    Types: Marijuana    Comment: daily  use of marijuana recently     Review of Systems was negative for a full 10 system review except as noted in the History of Present Illness.   Current Outpatient Medications:    albuterol (VENTOLIN HFA) 108 (90 Base) MCG/ACT inhaler, Inhale 1-2 puffs into the lungs every 4 (four) hours as needed for wheezing or shortness of breath., Disp: , Rfl:    busPIRone (BUSPAR) 7.5 MG tablet, Take 1 tablet (7.5 mg total) by mouth 2 (two) times daily. (Patient taking differently: Take 7.5 mg by mouth daily.), Disp: 60 tablet, Rfl: 5   cyclobenzaprine (FLEXERIL) 5 MG tablet, Take 5 mg by mouth 3 (three) times daily as needed for muscle spasms., Disp: , Rfl:    dexlansoprazole (DEXILANT) 60 MG capsule, Take 60 mg by mouth at bedtime., Disp: , Rfl:    ibuprofen (ADVIL) 200 MG tablet, Take 800 mg by mouth every 6 (six) hours as needed for moderate pain., Disp: , Rfl:    lamoTRIgine (LAMICTAL) 25 MG tablet, Take 1 tablet by mouth daily., Disp: , Rfl:    loratadine (CLARITIN) 10 MG tablet, Take 10 mg by mouth daily., Disp: , Rfl:    Menthol, Topical Analgesic, (BIOFREEZE EX), Apply 1 Application topically daily  as needed (pain)., Disp: , Rfl:    metFORMIN (GLUCOPHAGE-XR) 500 MG 24 hr tablet, Take 500 mg by mouth daily., Disp: , Rfl:    ondansetron (ZOFRAN) 8 MG tablet, Take 8 mg by mouth every 8 (eight) hours as needed for nausea or vomiting., Disp: , Rfl:    Oxycodone HCl 10 MG TABS, Take 1 tablet by mouth four times a day as needed for pain, Disp: 120 tablet, Rfl: 0  promethazine (PHENERGAN) 25 MG tablet, Take 1 tablet (25 mg total) by mouth every 6 (six) hours as needed for nausea. Nausea, Disp: 30 tablet, Rfl: 1   rizatriptan (MAXALT-MLT) 10 MG disintegrating tablet, Take 10 mg by mouth as needed for migraine., Disp: , Rfl:    sertraline (ZOLOFT) 25 MG tablet, Take 2 tablets (50 mg total) by mouth AC breakfast., Disp: 90 tablet, Rfl: 2   SYNTHROID 137 MCG tablet, Take 137 mcg by mouth every morning., Disp: , Rfl:    UNABLE TO FIND, Take 1-2 tablets by mouth 2 (two) times daily as needed (sleep/pain). Cbd gummies, Disp: , Rfl:    valACYclovir (VALTREX) 1000 MG tablet, TAKE 2 TABS (2 GRAMS) BY MOUTH NOW AND REPEAT IN 12 HOURS X 1, AS NEEDED FOR COLDSORE, Disp: 30 tablet, Rfl: 1   Oxycodone HCl 10 MG TABS, Take 1 tablet by mouth four times a day as needed for pain (Patient not taking: Reported on 11/01/2021), Disp: 120 tablet, Rfl: 0   Objective Vitals:   11/01/21 1043  BP: 112/75  Pulse: 64    Gen: NAD CV: S1 S2 RRR Lungs: Clear to auscultation bilaterally Abd: soft, nontender   Previous Pelvic Exam showed: POP-Q (09/18/21):    POP-Q   0.5                                            Aa   0.5                                           Ba   -5.5                                              C    4                                            Gh   4.5                                            Pb   10                                            tvl    -2                                            Ap   -2                                            Bp   -8  D       Assessment/ Plan  Assessment: The patient is a 56 y.o. year old scheduled to undergo Exam under anesthesia, robotic assisted total laparoscopic hysterectomy, bilateral salpingectomy, sacrocolpopexy, single incision sling, cystoscopy. Verbal consent was obtained for these procedures.  Plan: General Surgical Consent: The patient has previously been counseled on alternative treatments, and the decision by the patient and provider was to proceed with the procedure listed above.  For all procedures, there are risks of bleeding, infection, damage to surrounding organs including but not limited to bowel, bladder, blood vessels, ureters and nerves, and need for further surgery if an injury were to occur. These risks are all low with minimally invasive surgery.   There are risks of numbness and weakness at any body site or buttock/rectal pain.  It is possible that baseline pain can be worsened by surgery, either with or without mesh. If surgery is vaginal, there is also a low risk of possible conversion to laparoscopy or open abdominal incision where indicated. Very rare risks include blood transfusion, blood clot, heart attack, pneumonia, or death.   There is also a risk of short-term postoperative urinary retention with need to use a catheter. About half of patients need to go home from surgery with a catheter, which is then later removed in the office. The risk of long-term need for a catheter is very low. There is also a risk of worsening of overactive bladder.   Sling: The effectiveness of a midurethral vaginal mesh sling is approximately 85%, and thus, there will be times when you may leak urine after surgery, especially if your bladder is full or if you have a strong cough. There is a balance between making the sling tight enough to treat your leakage but not too tight so that you have long-term difficulty emptying your bladder. A mesh sling will not directly treat  overactive bladder/urge incontinence and may worsen it.  There is an FDA safety notification on vaginal mesh procedures for prolapse but NOT mesh slings. We have extensive experience and training with mesh placement and we have close postoperative follow up to identify any potential complications from mesh. It is important to realize that this mesh is a permanent implant that cannot be easily removed. There are rare risks of mesh exposure (2-4%), pain with intercourse (0-7%), and infection (<1%). The risk of mesh exposure if more likely in a woman with risks for poor healing (prior radiation, poorly controlled diabetes, or immunocompromised). The risk of new or worsened chronic pain after mesh implant is more common in women with baseline chronic pain and/or poorly controlled anxiety or depression. Approximately 2-4% of patients will experience longer-term post-operative voiding dysfunction that may require surgical revision of the sling. We also reviewed that postoperatively, her stream may not be as strong as before surgery.    Prolapse (with or without mesh): Risk factors for surgical failure  include things that put pressure on your pelvis and the surgical repair, including obesity, chronic cough, and heavy lifting or straining (including lifting children or adults, straining on the toilet, or lifting heavy objects such as furniture or anything weighing >25 lbs. Risks of recurrence is 20-30% with vaginal native tissue repair and a less than 10% with sacrocolpopexy with mesh.    Sacrocolpopexy: Mesh implants may provide more prolapse support, but do have some unique risks to consider. It is important to understand that mesh is permanent and cannot be easily removed. Risks of abdominal sacrocolpopexy mesh include mesh exposure (~3-6%),  painful intercourse (recent studies show lower rates after surgery compared to before, with ~5-8% risk of new onset), and very rare risks of bowel or bladder injury or  infection (<1%). The risk of mesh exposure is more likely in a woman with risks for poor healing (prior radiation, poorly controlled diabetes, or immunocompromised). The risk of new or worsened chronic pain after mesh implant is more common in women with baseline chronic pain and/or poorly controlled anxiety or depression. There is an FDA safety notification on vaginal mesh procedures for prolapse but NOT abdominal mesh procedures and therefore does not apply to your surgery. We have extensive experience and training with mesh placement and we have close postoperative follow up to identify any potential complications from mesh.    We discussed consent for blood products. Risks for blood transfusion include allergic reactions, other reactions that can affect different body organs and managed accordingly, transmission of infectious diseases such as HIV or Hepatitis. However, the blood is screened. Patient consents for blood products.  Pre-operative instructions:  She was instructed to not take Aspirin/NSAIDs x 7days prior to surgery. Antibiotic prophylaxis was ordered as indicated.  Cathter use: Patient will go home with foley if needed after post-operative voiding trial.  Post-operative instructions:  She was provided with specific post-operative instructions, including precautions and signs/symptoms for which we would recommend contacting us, in addition to daytime and after-hours contact phone numbers. This was provided on a handout.   Post-operative medications: Prescriptions for motrin, tylenol, miralax were sent to the pharmacy. She has a pain contract with [facility] and requested that she have a letter sent to our office stating ok to prescribe narcotics. Currently takes '10mg'$  oxycodone.  Discussed using ibuprofen and tylenol on a schedule to limit use of narcotics.   Laboratory testing:  We will check labs: Type and screen, BMP   Preoperative clearance:  She does require surgical  clearance.  Requested clearance from Neurosurgeon [facility])- request sent on 10/11/21 and have not heard back yet.   Post-operative follow-up:  A post-operative appointment will be made for 6 weeks from the date of surgery. If she needs a post-operative nurse visit for a voiding trial, that will be set up after she leaves the hospital.    Patient will call the clinic or use MyChart should anything change or any new issues arise.   Jaquita Folds, MD  Time spent: I spent 30 minutes dedicated to the care of this patient on the date of this encounter to include pre-visit review of records, face-to-face time with the patient  and post visit documentation and ordering medication/ testing.

## 2021-11-04 ENCOUNTER — Other Ambulatory Visit (HOSPITAL_BASED_OUTPATIENT_CLINIC_OR_DEPARTMENT_OTHER): Payer: Self-pay

## 2021-11-04 NOTE — H&P (Signed)
[facility] Pre-Operative H&P  Subjective Chief Complaint: April Meyer presents for a preoperative encounter.   History of Present Illness: April Meyer is a 56 y.o. female who presents for preoperative visit.  She is scheduled to undergo Exam under anesthesia, robotic assisted total laparoscopic hysterectomy, bilateral salpingectomy, sacrocolpopexy, single incision sling, cystoscopy on 11/21/21.  Her symptoms include vaginal bulge, and she was was found to have Stage II anterior, Stage I posterior, Stage I apical prolapse.   Urodynamics showed: 1. Sensation was normal; capacity was normal 2. Stress Incontinence was demonstrated at normal pressures; 3. Detrusor Overactivity was not demonstrated. 4. Emptying was dysfunctional with a normal PVR, a sustained detrusor contraction present, abdominal straining present, normal urethral sphincter activity on EMG.  Past Medical History:  Diagnosis Date   ADHD (attention deficit hyperactivity disorder)    no meds for   Anxiety    Arnold-Chiari deformity (HCC)    Asthma    AVM (arteriovenous malformation) spine 04/21/2018   Bruises easily    Cerebral venous sinus thrombosis, remote, resolved 03/28/2013   Chronic pain    Common migraine with intractable migraine 6/50/3546   Complication of anesthesia age 71    breast lumpectomy heart rate went way down in hospital stayed 2 days, no problems since   Complication of anesthesia    C 1 to C 3 fusion side to side and up and down limited    EDS (Ehlers-Danlos syndrome)    Eustachian tube dysfunction    Fatigue    GERD (gastroesophageal reflux disease)    Glioma (HCC)    low graded tectal plate glioma   Hemorrhoids    History of sleep apnea    no cpap used last 2 years, normal sleep study 2 yrs ago   HSV infection    Hypothyroid    Hypothyroidism    IIH (idiopathic intracranial hypertension)    Lumbar disc herniation    Migraine    Neck stiffness    Nystagmus, positional, central  type 03/28/2013   Other malaise and fatigue 03/28/2013   " The patient reports feeling happy, ED, sore" on she has undergone at Chiari malformation surgery decompression in 2011. Prior to the surgery were chief complaints were weakness numbness and neck problems. She had swallowing difficulties and dizziness;  Classic Chiari presentation. But she didn't have headaches.   Photophobia    Pre-diabetes    Tinnitus of both ears    Urinary incontinence    Vision disturbance    Vitamin D deficiency    Weakness    Wears glasses      Past Surgical History:  Procedure Laterality Date   ARNOLD CHIARI REPAIR  2010   Brain shunt  12/30/2019   vp shunt   BRAIN SURGERY     BREAST BIOPSY Left    lumpectomy age 59    BREAST EXCISIONAL BIOPSY Right yrs ago   CHOLECYSTECTOMY  age 27   laparoscopic   colonscopy  2012, 2018   DILATION AND CURETTAGE OF UTERUS  last done yrs ago   x 2 or 3   ENDOMETRIAL ABLATION  02/09/2013   [facility]   ESOPHAGOGASTRODUODENOSCOPY N/A 02/05/2021   Procedure: ESOPHAGOGASTRODUODENOSCOPY (EGD);  Surgeon: Carol Ada, MD;  Location: [facility];  Service: Endoscopy;  Laterality: N/A;   HYSTEROSCOPY WITH D & C N/A 06/26/2020   Procedure: DILATATION AND CURETTAGE /HYSTEROSCOPY;  Surgeon: Salvadore Dom, MD;  Location: [facility];  Service: Gynecology;  Laterality: N/A;  INTRAUTERINE DEVICE INSERTION  10/2010   Mirena   IUD REMOVAL  11/2010   could not tolerate hormonal side effects   metal plate removed from head  2015   screw came loose   neck fusion c1-c3  2010   OPERATIVE ULTRASOUND N/A 06/26/2020   Procedure: OPERATIVE ULTRASOUND;  Surgeon: Salvadore Dom, MD;  Location: [facility];  Service: Gynecology;  Laterality: N/A;   TONSILLECTOMY AND ADENOIDECTOMY  age 5   UPPER GI ENDOSCOPY  2012, 2018    is allergic to advair hfa [fluticasone-salmeterol], other, prednisone, and morphine and related.   Family History   Problem Relation Age of Onset   Aortic dissection Father    Diabetes Maternal Aunt    Breast cancer Maternal Aunt        40's   Diabetes Maternal Uncle    Breast cancer Paternal Aunt 22   Diabetes Maternal Grandmother    Cancer Maternal Grandmother        SKIN AND LUNG   Diabetes Paternal Grandmother    Breast cancer Maternal Aunt        50's   Breast cancer Maternal Aunt        50's    Social History   Tobacco Use   Smoking status: Former    Packs/day: 2.00    Years: 21.00    Total pack years: 42.00    Types: Cigarettes    Quit date: 09/09/2007    Years since quitting: 14.1   Smokeless tobacco: Never  Vaping Use   Vaping Use: Never used  Substance Use Topics   Alcohol use: No    Comment: h/o binge drinking none in 10 years   Drug use: Yes    Types: Marijuana    Comment: daily  use of marijuana recently     Review of Systems was negative for a full 10 system review except as noted in the History of Present Illness.  No current facility-administered medications for this encounter.  Current Outpatient Medications:    albuterol (VENTOLIN HFA) 108 (90 Base) MCG/ACT inhaler, Inhale 1-2 puffs into the lungs every 4 (four) hours as needed for wheezing or shortness of breath., Disp: , Rfl:    busPIRone (BUSPAR) 7.5 MG tablet, Take 1 tablet (7.5 mg total) by mouth 2 (two) times daily. (Patient taking differently: Take 7.5 mg by mouth daily.), Disp: 60 tablet, Rfl: 5   cyclobenzaprine (FLEXERIL) 5 MG tablet, Take 5 mg by mouth 3 (three) times daily as needed for muscle spasms., Disp: , Rfl:    dexlansoprazole (DEXILANT) 60 MG capsule, Take 60 mg by mouth at bedtime., Disp: , Rfl:    ibuprofen (ADVIL) 200 MG tablet, Take 800 mg by mouth every 6 (six) hours as needed for moderate pain., Disp: , Rfl:    lamoTRIgine (LAMICTAL) 25 MG tablet, Take 1 tablet by mouth daily., Disp: , Rfl:    loratadine (CLARITIN) 10 MG tablet, Take 10 mg by mouth daily., Disp: , Rfl:    Menthol,  Topical Analgesic, (BIOFREEZE EX), Apply 1 Application topically daily as needed (pain)., Disp: , Rfl:    metFORMIN (GLUCOPHAGE-XR) 500 MG 24 hr tablet, Take 500 mg by mouth daily., Disp: , Rfl:    ondansetron (ZOFRAN) 8 MG tablet, Take 8 mg by mouth every 8 (eight) hours as needed for nausea or vomiting., Disp: , Rfl:    Oxycodone HCl 10 MG TABS, Take 1 tablet by mouth four times a day as needed for pain,  Disp: 120 tablet, Rfl: 0   promethazine (PHENERGAN) 25 MG tablet, Take 1 tablet (25 mg total) by mouth every 6 (six) hours as needed for nausea. Nausea, Disp: 30 tablet, Rfl: 1   rizatriptan (MAXALT-MLT) 10 MG disintegrating tablet, Take 10 mg by mouth as needed for migraine., Disp: , Rfl:    sertraline (ZOLOFT) 25 MG tablet, Take 2 tablets (50 mg total) by mouth AC breakfast., Disp: 90 tablet, Rfl: 2   SYNTHROID 137 MCG tablet, Take 137 mcg by mouth every morning., Disp: , Rfl:    UNABLE TO FIND, Take 1-2 tablets by mouth 2 (two) times daily as needed (sleep/pain). Cbd gummies, Disp: , Rfl:    valACYclovir (VALTREX) 1000 MG tablet, TAKE 2 TABS (2 GRAMS) BY MOUTH NOW AND REPEAT IN 12 HOURS X 1, AS NEEDED FOR COLDSORE, Disp: 30 tablet, Rfl: 1   acetaminophen (TYLENOL) 500 MG tablet, Take 1 tablet (500 mg total) by mouth every 6 (six) hours as needed (pain)., Disp: 30 tablet, Rfl: 0   ibuprofen (ADVIL) 600 MG tablet, Take 1 tablet (600 mg total) by mouth every 6 (six) hours as needed., Disp: 30 tablet, Rfl: 0   Oxycodone HCl 10 MG TABS, Take 1 tablet by mouth four times a day as needed for pain (Patient not taking: Reported on 11/01/2021), Disp: 120 tablet, Rfl: 0   polyethylene glycol powder (GLYCOLAX/MIRALAX) 17 GM/SCOOP powder, Take 17 g by mouth daily. Drink 17g (1 scoop) dissolved in water per day., Disp: 255 g, Rfl: 0   Objective There were no vitals filed for this visit.   Gen: NAD CV: S1 S2 RRR Lungs: Clear to auscultation bilaterally Abd: soft, nontender   Previous Pelvic Exam  showed: POP-Q (09/18/21):    POP-Q   0.5                                            Aa   0.5                                           Ba   -5.5                                              C    4                                            Gh   4.5                                            Pb   10                                            tvl    -2  Ap   -2                                            Bp   -8                                              D       Assessment/ Plan  Assessment: The patient is a 56 y.o. year old with stage II POP scheduled to undergo Exam under anesthesia, robotic assisted total laparoscopic hysterectomy, bilateral salpingectomy, sacrocolpopexy, single incision sling, cystoscopy.   Jaquita Folds, MD

## 2021-11-05 ENCOUNTER — Encounter (HOSPITAL_COMMUNITY)
Admission: RE | Admit: 2021-11-05 | Discharge: 2021-11-05 | Disposition: A | Discharge status: Home / Self Care | Payer: PPO | Source: Ambulatory Visit | Attending: Family Medicine | Admitting: Family Medicine

## 2021-11-13 NOTE — Patient Instructions (Signed)
DUE TO SPACE LIMITATIONS, ONLY TWO VISITORS  (aged 55 and older) ARE ALLOWED TO COME WITH YOU AND STAY IN THE WAITING ROOM DURING YOUR PRE OP AND PROCEDURE.   **NO VISITORS ARE ALLOWED IN THE SHORT STAY AREA OR RECOVERY ROOM!!**  IF YOU WILL BE ADMITTED INTO THE HOSPITAL YOU ARE ALLOWED ONLY FOUR SUPPORT PEOPLE DURING VISITATION HOURS (7 AM -8PM)   The support person(s) must pass our screening, and use Hand sanitizing gel. Visitors GUEST BADGE MUST BE WORN VISIBLY  One adult visitor may remain with you overnight and MUST be in the room by 8 P.M.   You are not required to quarantine at this time prior to your surgery. However, you must do this: Hand Hygiene often Do NOT share personal items Notify your provider if you are in close contact with someone who has COVID or you develop fever 100.4 or greater, new onset of sneezing, cough, sore throat, shortness of breath or body aches.       Your procedure is scheduled on:  Thursday   November 21, 2021  Report to [facility] Main Entrance.  Report to admitting at: 05:15    AM  +++++Call this number if you have any questions or problems the morning of surgery [phone]  Do not eat food :After Midnight the night prior to your surgery/procedure.  After Midnight you may have the following liquids until 04:30  AM  DAY OF SURGERY  Clear Liquid Diet Water Black Coffee (sugar ok, NO MILK/CREAM OR CREAMERS)  Tea (sugar ok, NO MILK/CREAM OR CREAMERS) regular and decaf                             Plain Jell-O (NO RED)                                           Fruit ices (not with fruit pulp, NO RED)                                     Popsicles (NO RED)                                                                  Juice: apple, WHITE grape, WHITE cranberry Sports drinks like Gatorade (NO RED)                 FOLLOW BOWEL PREP AND ANY ADDITIONAL PRE OP INSTRUCTIONS YOU RECEIVED FROM YOUR SURGEON'S OFFICE!!!   Oral Hygiene is also  important to reduce your risk of infection.        Remember - BRUSH YOUR TEETH THE MORNING OF SURGERY WITH YOUR REGULAR TOOTHPASTE  METFORMIN- Do not take the day of your surgery.   Take ONLY these medicines the morning of surgery with A SIP OF WATER: Buspirone (Buspar), Lamotrigine (Lamictal), Sertaline (Zoloft), Levothyroxine (Synthroid), If needed you may take Oxycodone, Tylenol, rizatriptan (Maxalt)   Bring CPAP mask and tubing day of surgery.  You may not have any metal on your body including hair pins, jewelry, and body piercing  Do not wear make-up, lotions, powders, perfumes or deodorant  Do not wear nail polish including gel and S&S, artificial / acrylic nails, or any other type of covering on natural nails including finger and toenails. If you have artificial nails, gel coating, etc., that needs to be removed by a nail salon, Please have this removed prior to surgery. Not doing so may mean that your surgery could be cancelled or delayed if the Surgeon or anesthesia staff feels like they are unable to monitor you safely.   Do not shave 48 hours prior to surgery to avoid nicks in your skin which may contribute to postoperative infections.   Contacts, Hearing Aids, dentures or bridgework may not be worn into surgery.   You may bring a small overnight bag with you on the day of surgery, only pack items that are not valuable .[facility] IS NOT RESPONSIBLE   FOR VALUABLES THAT ARE LOST OR STOLEN.   DO NOT [place]. PHARMACY WILL DISPENSE MEDICATIONS LISTED ON YOUR MEDICATION LIST TO YOU DURING YOUR ADMISSION [facility]!   Special Instructions: Bring a copy of your healthcare power of attorney and living will documents the day of surgery, if you wish to have them scanned into your [facility] Records- EPIC  Please read over the following fact sheets you were given: IF YOU HAVE QUESTIONS ABOUT YOUR PRE-OP INSTRUCTIONS,  PLEASE CALL [phone]  ([place])   [facility] - Preparing for Surgery Before surgery, you can play an important role.  Because skin is not sterile, your skin needs to be as free of germs as possible.  You can reduce the number of germs on your skin by washing with CHG (chlorahexidine gluconate) soap before surgery.  CHG is an antiseptic cleaner which kills germs and bonds with the skin to continue killing germs even after washing. Please DO NOT use if you have an allergy to CHG or antibacterial soaps.  If your skin becomes reddened/irritated stop using the CHG and inform your nurse when you arrive at Short Stay. Do not shave (including legs and underarms) for at least 48 hours prior to the first CHG shower.  You may shave your face/neck.  Please follow these instructions carefully:  1.  Shower with CHG Soap the night before surgery and the  morning of surgery.  2.  If you choose to wash your hair, wash your hair first as usual with your normal  shampoo.  3.  After you shampoo, rinse your hair and body thoroughly to remove the shampoo.                             4.  Use CHG as you would any other liquid soap.  You can apply chg directly to the skin and wash.  Gently with a scrungie or clean washcloth.  5.  Apply the CHG Soap to your body ONLY FROM THE NECK DOWN.   Do not use on face/ open                           Wound or open sores. Avoid contact with eyes, ears mouth and genitals (private parts).  Wash face,  Genitals (private parts) with your normal soap.             6.  Wash thoroughly, paying special attention to the area where your  surgery  will be performed.  7.  Thoroughly rinse your body with warm water from the neck down.  8.  DO NOT shower/wash with your normal soap after using and rinsing off the CHG Soap.            9.  Pat yourself dry with a clean towel.            10.  Wear clean pajamas.            11.  Place clean sheets on your bed the night of your  first shower and do not  sleep with pets.  ON THE DAY OF SURGERY : Do not apply any lotions/deodorants the morning of surgery.  Please wear clean clothes to the hospital/surgery center.    FAILURE TO FOLLOW THESE INSTRUCTIONS MAY RESULT IN THE CANCELLATION OF YOUR SURGERY  PATIENT SIGNATURE_________________________________  NURSE SIGNATURE__________________________________  ________________________________________________________________________

## 2021-11-13 NOTE — Progress Notes (Addendum)
COVID Vaccine received:  '[]'$  No '[x]'$  Yes Date of any COVID positive Test in last 90 days: none  PCP - Horald Pollen, MD Cardiologist - none Pain Medicine - Dr. Nicholaus Bloom Neurosurgery- Dr. Marylyn Ishihara [facility] [place], [place] [address]   [phone] (Work)   [phone] (Fax)    Chest x-ray - 01-31-2021  Epic EKG -  01-30-2021  Epic Stress Test n/a  ECHO - n/a Cardiac Cath - n/a  Pacemaker/ICD device      N/A Spinal Cord Stimulator:'[x]'$  '[]'$ No '[]'$  Yes      (Remind patient to bring remote DOS) Other Implants:   Bowel Prep - none per patient  History of Sleep Apnea? '[]'$  No '[x]'$  Yes   Sleep Study Date:  2015  mild OSA CPAP used?- '[x]'$  No '[]'$  Yes  (Instruct to bring their mask & Tubing)  Does the patient monitor blood sugar? '[x]'$  No '[]'$  Yes  '[]'$  N/A Does patient have a [employer] or Dexacom? '[x]'$  No '[]'$  Yes   Fasting Blood Sugar Ranges- ?  Doesn't check Checks Blood Sugar __0___ times a day  Blood Thinner Instructions: none Aspirin Instructions: None Last Dose:  ERAS Protocol Ordered: '[]'$  No  '[x]'$  Yes  no drink ordered PRE-SURGERY '[]'$  ENSURE  '[]'$  G2   Comments: Patient had T&S drawn at PST however the correct Lab paperwork was not sent with the bloodwork. Unfortunately, a T&S will need to be drawn DOS per Harlon Flor, RN   Activity level: Patient can / can not climb a flight of stairs without difficulty;  '[]'$  No CP  '[]'$  No SOB,  but would have ______   Anesthesia review: COPD, VP shunt for IIH (12-30-18), Arnold-Chiari Repair of AVM, Neck fusion (C1-C3) Ehlers-Danlos Syndrome, Pre-DM   Patient denies shortness of breath, fever, cough and chest pain at PAT appointment.  Patient verbalized understanding and agreement to the Pre-Surgical Instructions that were given to them at this PAT appointment. Patient was also educated of the need to review these PAT instructions again prior to his/her surgery.I reviewed the appropriate phone numbers to call if they have any and  questions or concerns.

## 2021-11-15 ENCOUNTER — Encounter (HOSPITAL_COMMUNITY)
Admission: RE | Admit: 2021-11-15 | Discharge: 2021-11-15 | Disposition: A | Discharge status: Home / Self Care | Payer: PPO | Source: Ambulatory Visit | Attending: Obstetrics and Gynecology | Admitting: Obstetrics and Gynecology

## 2021-11-15 ENCOUNTER — Other Ambulatory Visit: Payer: Self-pay

## 2021-11-15 ENCOUNTER — Encounter (HOSPITAL_COMMUNITY): Payer: Self-pay

## 2021-11-15 VITALS — BP 130/86 | HR 70 | Temp 98.8°F | Resp 18 | Ht 67.0 in | Wt 225.0 lb

## 2021-11-15 DIAGNOSIS — Z01818 Encounter for other preprocedural examination: Secondary | ICD-10-CM

## 2021-11-15 DIAGNOSIS — Z01812 Encounter for preprocedural laboratory examination: Secondary | ICD-10-CM | POA: Diagnosis present

## 2021-11-15 DIAGNOSIS — R7303 Prediabetes: Secondary | ICD-10-CM | POA: Insufficient documentation

## 2021-11-15 HISTORY — DX: Pneumonia, unspecified organism: J18.9

## 2021-11-15 HISTORY — DX: Myoneural disorder, unspecified: G70.9

## 2021-11-15 LAB — TYPE AND SCREEN
ABO/RH(D): A POS
Antibody Screen: NEGATIVE

## 2021-11-15 LAB — BASIC METABOLIC PANEL
Anion gap: 6 (ref 5–15)
BUN: 13 mg/dL (ref 6–20)
CO2: 22 mmol/L (ref 22–32)
Calcium: 8.6 mg/dL — ABNORMAL LOW (ref 8.9–10.3)
Chloride: 109 mmol/L (ref 98–111)
Creatinine, Ser: 0.89 mg/dL (ref 0.44–1.00)
GFR, Estimated: >60 mL/min (ref >60)
Glucose, Bld: 130 mg/dL — ABNORMAL HIGH (ref 70–99)
Potassium: 3.7 mmol/L (ref 3.5–5.1)
Sodium: 137 mmol/L (ref 135–145)

## 2021-11-15 LAB — CBC
HCT: 37.1 % (ref 36.0–46.0)
Hemoglobin: 12.1 g/dL (ref 12.0–15.0)
MCH: 29.2 pg (ref 26.0–34.0)
MCHC: 32.6 g/dL (ref 30.0–36.0)
MCV: 89.4 fL (ref 80.0–100.0)
Platelets: 205 10*3/uL (ref 150–400)
RBC: 4.15 MIL/uL (ref 3.87–5.11)
RDW: 13.2 % (ref 11.5–15.5)
WBC: 7.8 10*3/uL (ref 4.0–10.5)
nRBC: 0 % (ref 0.0–0.2)

## 2021-11-15 LAB — HEMOGLOBIN A1C
Hgb A1c MFr Bld: 5.7 % — ABNORMAL HIGH (ref 4.8–5.6)
Mean Plasma Glucose: 116.89 mg/dL

## 2021-11-15 LAB — GLUCOSE, CAPILLARY: Glucose-Capillary: 127 mg/dL — ABNORMAL HIGH (ref 70–99)

## 2021-11-20 ENCOUNTER — Other Ambulatory Visit (HOSPITAL_BASED_OUTPATIENT_CLINIC_OR_DEPARTMENT_OTHER): Payer: Self-pay

## 2021-11-20 MED ORDER — PROMETHAZINE HCL 12.5 MG PO TABS
12.5000 mg | ORAL_TABLET | Freq: Three times a day (TID) | ORAL | 0 refills | Status: DC | PRN
  Filled 2021-11-20: qty 30, 10d supply, fill #0

## 2021-11-20 MED ORDER — OXYCODONE HCL 10 MG PO TABS
10.0000 mg | ORAL_TABLET | ORAL | 0 refills | Status: DC | PRN
Start: 2021-11-20 — End: 2022-07-29
  Filled 2021-11-20 – 2021-11-27 (×2): qty 180, 30d supply, fill #0

## 2021-11-20 NOTE — Anesthesia Preprocedure Evaluation (Addendum)
Anesthesia Evaluation  Patient identified by MRN, date of birth, ID band Patient awake    Reviewed: Allergy & Precautions, NPO status , Patient's Chart, lab work & pertinent test results  Airway Mallampati: III  TM Distance: >3 FB Neck ROM: Full    Dental  (+) Teeth Intact, Dental Advisory Given   Pulmonary asthma , COPD,  COPD inhaler, former smoker,  42 pack year history, quit smoking 2009  Albuterol use every 73mo  Pulmonary exam normal breath sounds clear to auscultation       Cardiovascular negative cardio ROS Normal cardiovascular exam Rhythm:Regular Rate:Normal     Neuro/Psych  Headaches, PSYCHIATRIC DISORDERS Anxiety VP shunt for IIH (12-30-18), Arnold-Chiari Repair of AVM    GI/Hepatic Neg liver ROS, GERD  Controlled,  Endo/Other  diabetes, Well Controlled, Type 2, Oral Hypoglycemic AgentsHypothyroidism Obesity BMI 35  Renal/GU negative Renal ROS  negative genitourinary   Musculoskeletal Ehlers danlos   Abdominal (+) + obese,   Peds  Hematology negative hematology ROS (+) Hb 12.1   Anesthesia Other Findings Chronic oxy 10-   Reproductive/Obstetrics negative OB ROS                            Anesthesia Physical Anesthesia Plan  ASA: 3  Anesthesia Plan: General   Post-op Pain Management: Tylenol PO (pre-op)* and Dilaudid IV   Induction: Intravenous  PONV Risk Score and Plan: 4 or greater and Ondansetron, Dexamethasone, Midazolam, Scopolamine patch - Pre-op and Treatment may vary due to age or medical condition  Airway Management Planned: Oral ETT  Additional Equipment: None  Intra-op Plan:   Post-operative Plan: Extubation in OR  Informed Consent: I have reviewed the patients History and Physical, chart, labs and discussed the procedure including the risks, benefits and alternatives for the proposed anesthesia with the patient or authorized representative who has  indicated his/her understanding and acceptance.     Dental advisory given  Plan Discussed with: CRNA  Anesthesia Plan Comments:        Anesthesia Quick Evaluation

## 2021-11-21 ENCOUNTER — Ambulatory Visit (HOSPITAL_BASED_OUTPATIENT_CLINIC_OR_DEPARTMENT_OTHER): Payer: PPO | Admitting: Anesthesiology

## 2021-11-21 ENCOUNTER — Other Ambulatory Visit: Payer: Self-pay

## 2021-11-21 ENCOUNTER — Encounter (HOSPITAL_COMMUNITY)
Admission: RE | Disposition: A | Discharge status: Home / Self Care | Payer: Self-pay | Source: Ambulatory Visit | Attending: Obstetrics and Gynecology

## 2021-11-21 ENCOUNTER — Encounter (HOSPITAL_COMMUNITY): Payer: Self-pay | Admitting: Obstetrics and Gynecology

## 2021-11-21 ENCOUNTER — Ambulatory Visit (HOSPITAL_COMMUNITY)
Admission: RE | Admit: 2021-11-21 | Discharge: 2021-11-21 | Disposition: A | Discharge status: Home / Self Care | Payer: PPO | Source: Ambulatory Visit | Attending: Obstetrics and Gynecology | Admitting: Obstetrics and Gynecology

## 2021-11-21 ENCOUNTER — Ambulatory Visit (HOSPITAL_COMMUNITY): Payer: PPO | Admitting: Physician Assistant

## 2021-11-21 DIAGNOSIS — D259 Leiomyoma of uterus, unspecified: Secondary | ICD-10-CM | POA: Diagnosis not present

## 2021-11-21 DIAGNOSIS — Z79891 Long term (current) use of opiate analgesic: Secondary | ICD-10-CM | POA: Diagnosis not present

## 2021-11-21 DIAGNOSIS — Z87728 Personal history of other specified (corrected) congenital malformations of nervous system and sense organs: Secondary | ICD-10-CM | POA: Insufficient documentation

## 2021-11-21 DIAGNOSIS — Z7984 Long term (current) use of oral hypoglycemic drugs: Secondary | ICD-10-CM

## 2021-11-21 DIAGNOSIS — F419 Anxiety disorder, unspecified: Secondary | ICD-10-CM | POA: Insufficient documentation

## 2021-11-21 DIAGNOSIS — N393 Stress incontinence (female) (male): Secondary | ICD-10-CM | POA: Insufficient documentation

## 2021-11-21 DIAGNOSIS — N812 Incomplete uterovaginal prolapse: Secondary | ICD-10-CM

## 2021-11-21 DIAGNOSIS — N838 Other noninflammatory disorders of ovary, fallopian tube and broad ligament: Secondary | ICD-10-CM | POA: Insufficient documentation

## 2021-11-21 DIAGNOSIS — Z6835 Body mass index (BMI) 35.0-35.9, adult: Secondary | ICD-10-CM | POA: Diagnosis not present

## 2021-11-21 DIAGNOSIS — E039 Hypothyroidism, unspecified: Secondary | ICD-10-CM | POA: Insufficient documentation

## 2021-11-21 DIAGNOSIS — N8003 Adenomyosis of the uterus: Secondary | ICD-10-CM | POA: Diagnosis not present

## 2021-11-21 DIAGNOSIS — E669 Obesity, unspecified: Secondary | ICD-10-CM | POA: Diagnosis not present

## 2021-11-21 DIAGNOSIS — R7303 Prediabetes: Secondary | ICD-10-CM

## 2021-11-21 DIAGNOSIS — E119 Type 2 diabetes mellitus without complications: Secondary | ICD-10-CM | POA: Diagnosis not present

## 2021-11-21 DIAGNOSIS — K219 Gastro-esophageal reflux disease without esophagitis: Secondary | ICD-10-CM | POA: Insufficient documentation

## 2021-11-21 DIAGNOSIS — R519 Headache, unspecified: Secondary | ICD-10-CM | POA: Diagnosis not present

## 2021-11-21 DIAGNOSIS — N8111 Cystocele, midline: Secondary | ICD-10-CM

## 2021-11-21 DIAGNOSIS — Z01818 Encounter for other preprocedural examination: Secondary | ICD-10-CM

## 2021-11-21 DIAGNOSIS — Q796 Ehlers-Danlos syndrome, unspecified: Secondary | ICD-10-CM | POA: Diagnosis not present

## 2021-11-21 DIAGNOSIS — D251 Intramural leiomyoma of uterus: Secondary | ICD-10-CM | POA: Insufficient documentation

## 2021-11-21 DIAGNOSIS — J449 Chronic obstructive pulmonary disease, unspecified: Secondary | ICD-10-CM | POA: Diagnosis not present

## 2021-11-21 DIAGNOSIS — Z9689 Presence of other specified functional implants: Secondary | ICD-10-CM | POA: Diagnosis not present

## 2021-11-21 HISTORY — PX: CYSTOSCOPY: SHX5120

## 2021-11-21 HISTORY — PX: BLADDER SUSPENSION: SHX72

## 2021-11-21 HISTORY — PX: XI ROBOTIC ASSISTED TOTAL HYSTERECTOMY WITH SACROCOLPOPEXY: SHX6825

## 2021-11-21 LAB — GLUCOSE, CAPILLARY
Glucose-Capillary: 116 mg/dL — ABNORMAL HIGH (ref 70–99)
Glucose-Capillary: 150 mg/dL — ABNORMAL HIGH (ref 70–99)

## 2021-11-21 SURGERY — HYSTERECTOMY, TOTAL, ROBOT-ASSISTED, WITH SACROCOLPOPEXY
Anesthesia: General

## 2021-11-21 MED ORDER — ACETAMINOPHEN 500 MG PO TABS
1000.0000 mg | ORAL_TABLET | ORAL | Status: DC
  Filled 2021-11-21: qty 2

## 2021-11-21 MED ORDER — PHENYLEPHRINE HCL-NACL 20-0.9 MG/250ML-% IV SOLN
INTRAVENOUS | Status: AC
  Filled 2021-11-21: qty 250

## 2021-11-21 MED ORDER — ONDANSETRON HCL 4 MG/2ML IJ SOLN: 4.0000 mg | Freq: Once | INTRAMUSCULAR | Status: DC | PRN

## 2021-11-21 MED ORDER — HYDROMORPHONE HCL 1 MG/ML IJ SOLN
INTRAMUSCULAR | Status: AC
  Administered 2021-11-21: 0.5 mg via INTRAVENOUS
  Filled 2021-11-21: qty 2

## 2021-11-21 MED ORDER — ACETAMINOPHEN 500 MG PO TABS: 1000.0000 mg | ORAL_TABLET | Freq: Once | ORAL | Status: DC

## 2021-11-21 MED ORDER — POVIDONE-IODINE 10 % EX SWAB: 2.0000 | Freq: Once | CUTANEOUS | Status: DC

## 2021-11-21 MED ORDER — ORAL CARE MOUTH RINSE: 15.0000 mL | Freq: Once | OROMUCOSAL | Status: DC

## 2021-11-21 MED ORDER — KETAMINE HCL 10 MG/ML IJ SOLN
INTRAMUSCULAR | Status: DC | PRN
  Administered 2021-11-21: 10 mg via INTRAVENOUS
  Administered 2021-11-21: 40 mg via INTRAVENOUS

## 2021-11-21 MED ORDER — KETOROLAC TROMETHAMINE 30 MG/ML IJ SOLN
INTRAMUSCULAR | Status: AC
  Filled 2021-11-21: qty 1

## 2021-11-21 MED ORDER — ROCURONIUM BROMIDE 10 MG/ML (PF) SYRINGE
PREFILLED_SYRINGE | INTRAVENOUS | Status: DC | PRN
  Administered 2021-11-21: 20 mg via INTRAVENOUS
  Administered 2021-11-21: 100 mg via INTRAVENOUS
  Administered 2021-11-21: 20 mg via INTRAVENOUS

## 2021-11-21 MED ORDER — SCOPOLAMINE 1 MG/3DAYS TD PT72
1.0000 | MEDICATED_PATCH | TRANSDERMAL | Status: DC
  Administered 2021-11-21: 1.5 mg via TRANSDERMAL
  Filled 2021-11-21: qty 1

## 2021-11-21 MED ORDER — OXYCODONE HCL 5 MG PO TABS
ORAL_TABLET | ORAL | Status: AC
  Filled 2021-11-21: qty 1

## 2021-11-21 MED ORDER — KETAMINE HCL 10 MG/ML IJ SOLN
INTRAMUSCULAR | Status: AC
  Filled 2021-11-21: qty 1

## 2021-11-21 MED ORDER — OXYCODONE HCL 5 MG PO TABS
5.0000 mg | ORAL_TABLET | Freq: Once | ORAL | Status: AC | PRN
  Administered 2021-11-21: 5 mg via ORAL

## 2021-11-21 MED ORDER — LIDOCAINE-EPINEPHRINE 1 %-1:100000 IJ SOLN
INTRAMUSCULAR | Status: DC | PRN
  Administered 2021-11-21: 10 mL

## 2021-11-21 MED ORDER — BUPIVACAINE HCL (PF) 0.25 % IJ SOLN
INTRAMUSCULAR | Status: DC | PRN
  Administered 2021-11-21: 20 mL

## 2021-11-21 MED ORDER — DEXAMETHASONE SODIUM PHOSPHATE 10 MG/ML IJ SOLN
INTRAMUSCULAR | Status: AC
  Filled 2021-11-21: qty 1

## 2021-11-21 MED ORDER — SODIUM CHLORIDE 0.9 % IR SOLN
Status: DC | PRN
  Administered 2021-11-21: 1000 mL

## 2021-11-21 MED ORDER — CHLORHEXIDINE GLUCONATE 0.12 % MT SOLN: 15.0000 mL | Freq: Once | OROMUCOSAL | Status: DC

## 2021-11-21 MED ORDER — ENOXAPARIN SODIUM 40 MG/0.4ML IJ SOSY
40.0000 mg | PREFILLED_SYRINGE | INTRAMUSCULAR | Status: AC
  Administered 2021-11-21: 40 mg via SUBCUTANEOUS
  Filled 2021-11-21: qty 0.4

## 2021-11-21 MED ORDER — LIDOCAINE HCL (PF) 1 % IJ SOLN
INTRAMUSCULAR | Status: AC
  Filled 2021-11-21: qty 30

## 2021-11-21 MED ORDER — OXYCODONE HCL 5 MG/5ML PO SOLN: 5.0000 mg | Freq: Once | ORAL | Status: AC | PRN

## 2021-11-21 MED ORDER — SUGAMMADEX SODIUM 200 MG/2ML IV SOLN
INTRAVENOUS | Status: DC | PRN
  Administered 2021-11-21: 200 mg via INTRAVENOUS

## 2021-11-21 MED ORDER — KETOROLAC TROMETHAMINE 30 MG/ML IJ SOLN
30.0000 mg | Freq: Once | INTRAMUSCULAR | Status: AC
  Administered 2021-11-21: 30 mg via INTRAVENOUS

## 2021-11-21 MED ORDER — ROCURONIUM BROMIDE 10 MG/ML (PF) SYRINGE
PREFILLED_SYRINGE | INTRAVENOUS | Status: AC
  Filled 2021-11-21: qty 10

## 2021-11-21 MED ORDER — LACTATED RINGERS IV SOLN: INTRAVENOUS | Status: DC

## 2021-11-21 MED ORDER — LIDOCAINE 2% (20 MG/ML) 5 ML SYRINGE: INTRAMUSCULAR | Status: DC | PRN

## 2021-11-21 MED ORDER — ONDANSETRON HCL 4 MG/2ML IJ SOLN
INTRAMUSCULAR | Status: AC
  Filled 2021-11-21: qty 2

## 2021-11-21 MED ORDER — ONDANSETRON HCL 4 MG/2ML IJ SOLN
INTRAMUSCULAR | Status: DC | PRN
  Administered 2021-11-21: 4 mg via INTRAVENOUS

## 2021-11-21 MED ORDER — FENTANYL CITRATE (PF) 100 MCG/2ML IJ SOLN
INTRAMUSCULAR | Status: DC | PRN
  Administered 2021-11-21 (×2): 50 ug via INTRAVENOUS

## 2021-11-21 MED ORDER — HYDROMORPHONE HCL 1 MG/ML IJ SOLN
0.2500 mg | INTRAMUSCULAR | Status: DC | PRN
  Administered 2021-11-21 (×3): 0.5 mg via INTRAVENOUS

## 2021-11-21 MED ORDER — FENTANYL CITRATE (PF) 100 MCG/2ML IJ SOLN
INTRAMUSCULAR | Status: AC
  Filled 2021-11-21: qty 2

## 2021-11-21 MED ORDER — MIDAZOLAM HCL 2 MG/2ML IJ SOLN
INTRAMUSCULAR | Status: AC
  Filled 2021-11-21: qty 2

## 2021-11-21 MED ORDER — DEXAMETHASONE SODIUM PHOSPHATE 10 MG/ML IJ SOLN: INTRAMUSCULAR | Status: DC | PRN

## 2021-11-21 MED ORDER — LIDOCAINE-EPINEPHRINE 1 %-1:100000 IJ SOLN
INTRAMUSCULAR | Status: AC
  Filled 2021-11-21: qty 1

## 2021-11-21 MED ORDER — PROPOFOL 10 MG/ML IV BOLUS
INTRAVENOUS | Status: AC
  Filled 2021-11-21: qty 20

## 2021-11-21 MED ORDER — LIDOCAINE HCL (PF) 2 % IJ SOLN
INTRAMUSCULAR | Status: DC | PRN
  Administered 2021-11-21: 60 mg via INTRADERMAL
  Administered 2021-11-21: 1 mg/kg/h via INTRADERMAL

## 2021-11-21 MED ORDER — EPHEDRINE SULFATE-NACL 50-0.9 MG/10ML-% IV SOSY
PREFILLED_SYRINGE | INTRAVENOUS | Status: DC | PRN
  Administered 2021-11-21 (×2): 10 mg via INTRAVENOUS

## 2021-11-21 MED ORDER — AMISULPRIDE (ANTIEMETIC) 5 MG/2ML IV SOLN: 10.0000 mg | Freq: Once | INTRAVENOUS | Status: DC | PRN

## 2021-11-21 MED ORDER — PHENAZOPYRIDINE HCL 200 MG PO TABS
200.0000 mg | ORAL_TABLET | ORAL | Status: AC
  Administered 2021-11-21: 200 mg via ORAL
  Filled 2021-11-21: qty 1

## 2021-11-21 MED ORDER — OXYCODONE HCL 5 MG PO TABS: 5.0000 mg | ORAL_TABLET | ORAL | 0 refills | Status: AC | PRN

## 2021-11-21 MED ORDER — MIDAZOLAM HCL 5 MG/5ML IJ SOLN
INTRAMUSCULAR | Status: DC | PRN
  Administered 2021-11-21: 2 mg via INTRAVENOUS

## 2021-11-21 MED ORDER — WATER FOR IRRIGATION, STERILE IR SOLN
Status: DC | PRN
  Administered 2021-11-21: 1000 mL

## 2021-11-21 MED ORDER — BUPIVACAINE HCL (PF) 0.25 % IJ SOLN
INTRAMUSCULAR | Status: AC
  Filled 2021-11-21: qty 30

## 2021-11-21 MED ORDER — PROPOFOL 10 MG/ML IV BOLUS
INTRAVENOUS | Status: DC | PRN
  Administered 2021-11-21: 50 mg via INTRAVENOUS
  Administered 2021-11-21: 150 mg via INTRAVENOUS

## 2021-11-21 MED ORDER — CEFAZOLIN SODIUM-DEXTROSE 2-4 GM/100ML-% IV SOLN
2.0000 g | INTRAVENOUS | Status: DC
  Filled 2021-11-21: qty 100

## 2021-11-21 SURGICAL SUPPLY — 86 items
ADH SKN CLS APL DERMABOND .7 (GAUZE/BANDAGES/DRESSINGS) ×1
AGENT HMST KT MTR STRL THRMB (HEMOSTASIS)
APL ESCP 34 STRL LF DISP (HEMOSTASIS)
APL PRP STRL LF DISP 70% ISPRP (MISCELLANEOUS) ×1
APPLICATOR SURGIFLO ENDO (HEMOSTASIS) IMPLANT
BLADE CLIPPER SENSICLIP SURGIC (BLADE) ×1 IMPLANT
BLADE SURG 15 STRL LF DISP TIS (BLADE) ×1 IMPLANT
BLADE SURG 15 STRL SS (BLADE) ×1
CATH FOLEY 3WAY  5CC 16FR (CATHETERS) ×1
CATH FOLEY 3WAY 5CC 16FR (CATHETERS) ×1 IMPLANT
CHLORAPREP W/TINT 26 (MISCELLANEOUS) ×1 IMPLANT
COVER BACK TABLE 60X90IN (DRAPES) IMPLANT
COVER TIP SHEARS 8 DVNC (MISCELLANEOUS) ×1 IMPLANT
COVER TIP SHEARS 8MM DA VINCI (MISCELLANEOUS) ×1
DEFOGGER SCOPE WARMER CLEARIFY (MISCELLANEOUS) ×1 IMPLANT
DERMABOND ADVANCED .7 DNX12 (GAUZE/BANDAGES/DRESSINGS) ×1 IMPLANT
DEVICE CAPIO SLIM SINGLE (INSTRUMENTS) IMPLANT
DRAPE ARM DVNC X/XI (DISPOSABLE) ×4 IMPLANT
DRAPE COLUMN DVNC XI (DISPOSABLE) ×1 IMPLANT
DRAPE DA VINCI XI ARM (DISPOSABLE) ×4
DRAPE DA VINCI XI COLUMN (DISPOSABLE) ×1
DRAPE UTILITY XL STRL (DRAPES) IMPLANT
DURAPREP 26ML APPLICATOR (WOUND CARE) ×1 IMPLANT
ELECT REM PT RETURN 15FT ADLT (MISCELLANEOUS) ×1 IMPLANT
GAUZE 4X4 16PLY ~~LOC~~+RFID DBL (SPONGE) ×1 IMPLANT
GLOVE BIO SURGEON STRL SZ 6 (GLOVE) ×3 IMPLANT
GLOVE BIOGEL PI IND STRL 6.5 (GLOVE) ×3 IMPLANT
GLOVE BIOGEL PI IND STRL 7.0 (GLOVE) ×2 IMPLANT
GLOVE ECLIPSE 6.0 STRL STRAW (GLOVE) ×1 IMPLANT
GOWN STRL REUS W/ TWL LRG LVL3 (GOWN DISPOSABLE) ×1 IMPLANT
GOWN STRL REUS W/TWL LRG LVL3 (GOWN DISPOSABLE) ×1
HIBICLENS CHG 4% 4OZ BTL (MISCELLANEOUS) ×1 IMPLANT
HOLDER FOLEY CATH W/STRAP (MISCELLANEOUS) ×1 IMPLANT
IRRIG SUCT STRYKERFLOW 2 WTIP (MISCELLANEOUS) ×1
IRRIGATION SUCT STRKRFLW 2 WTP (MISCELLANEOUS) ×1 IMPLANT
KIT TURNOVER KIT A (KITS) ×1 IMPLANT
MANIPULATOR ADVINCU DEL 2.5 PL (MISCELLANEOUS) IMPLANT
MANIPULATOR ADVINCU DEL 3.0 PL (MISCELLANEOUS) IMPLANT
MANIPULATOR ADVINCU DEL 3.5 PL (MISCELLANEOUS) IMPLANT
MANIPULATOR ADVINCU DEL 4.0 PL (MISCELLANEOUS) IMPLANT
MESH VERTESSA LITE -Y 2X4X3 (Mesh General) IMPLANT
NDL INSUFFLATION 14GA 120MM (NEEDLE) ×1 IMPLANT
NEEDLE HYPO 22GX1.5 SAFETY (NEEDLE) ×1 IMPLANT
NEEDLE INSUFFLATION 14GA 120MM (NEEDLE) ×1 IMPLANT
NS IRRIG 1000ML POUR BTL (IV SOLUTION) ×1 IMPLANT
OBTURATOR OPTICAL STANDARD 8MM (TROCAR)
OBTURATOR OPTICAL STND 8 DVNC (TROCAR)
OBTURATOR OPTICALSTD 8 DVNC (TROCAR) IMPLANT
OCCLUDER COLPOPNEUMO (BALLOONS) IMPLANT
PACK ROBOT WH (CUSTOM PROCEDURE TRAY) ×1 IMPLANT
PACK ROBOTIC GOWN (GOWN DISPOSABLE) ×1 IMPLANT
PACK TRENDGUARD 450 HYBRID PRO (MISCELLANEOUS) IMPLANT
PACK VAGINAL WOMENS (CUSTOM PROCEDURE TRAY) ×1 IMPLANT
PAD PREP 24X48 CUFFED NSTRL (MISCELLANEOUS) ×1 IMPLANT
PROTECTOR NERVE ULNAR (MISCELLANEOUS) ×2 IMPLANT
RETRACTOR LONE STAR DISPOSABLE (INSTRUMENTS) ×1 IMPLANT
RETRACTOR STAY HOOK 5MM (MISCELLANEOUS) ×1 IMPLANT
SEAL CANN UNIV 5-8 DVNC XI (MISCELLANEOUS) ×3 IMPLANT
SEAL XI 5MM-8MM UNIVERSAL (MISCELLANEOUS) ×5
SEALER VESSEL DA VINCI XI (MISCELLANEOUS) ×1
SEALER VESSEL EXT DVNC XI (MISCELLANEOUS) IMPLANT
SET IRRIG Y TYPE TUR BLADDER L (SET/KITS/TRAYS/PACK) ×1 IMPLANT
SET TUBE SMOKE EVAC HIGH FLOW (TUBING) ×1 IMPLANT
SLING ALTIS SYSTEM (Sling) IMPLANT
SPIKE FLUID TRANSFER (MISCELLANEOUS) ×2 IMPLANT
SUCTION FRAZIER HANDLE 10FR (MISCELLANEOUS) ×1
SUCTION TUBE FRAZIER 10FR DISP (MISCELLANEOUS) ×1 IMPLANT
SURGIFLO W/THROMBIN 8M KIT (HEMOSTASIS) IMPLANT
SUT ABS MONO DBL WITH NDL 48IN (SUTURE) IMPLANT
SUT MNCRL AB 4-0 PS2 18 (SUTURE) ×1 IMPLANT
SUT MON AB 2-0 SH 27 (SUTURE) ×3 IMPLANT
SUT MON AB 2-0 SH27 (SUTURE) IMPLANT
SUT V-LOC BARB 180 2/0GR6 GS22 (SUTURE) ×3
SUT VIC AB 0 CT1 27 (SUTURE) ×2
SUT VIC AB 0 CT1 27XBRD ANTBC (SUTURE) IMPLANT
SUT VIC AB 2-0 SH 27 (SUTURE) ×1
SUT VIC AB 2-0 SH 27XBRD (SUTURE) ×1 IMPLANT
SUT VICRYL 2-0 SH 8X27 (SUTURE) IMPLANT
SUT VLOC 180 0 9IN  GS21 (SUTURE) ×1
SUT VLOC 180 0 9IN GS21 (SUTURE) IMPLANT
SUTURE V-LC BRB 180 2/0GR6GS22 (SUTURE) IMPLANT
SYR BULB EAR ULCER 3OZ GRN STR (SYRINGE) ×1 IMPLANT
TOWEL GREEN STERILE (TOWEL DISPOSABLE) IMPLANT
TOWEL OR 17X26 10 PK STRL BLUE (TOWEL DISPOSABLE) ×2 IMPLANT
TRAY FOLEY W/BAG SLVR 14FR (SET/KITS/TRAYS/PACK) ×1 IMPLANT
TRENDGUARD 450 HYBRID PRO PACK (MISCELLANEOUS)

## 2021-11-21 NOTE — Anesthesia Postprocedure Evaluation (Signed)
Anesthesia Post Note  Patient: April Meyer  Procedure(s) Performed: XI ROBOTIC ASSISTED TOTAL HYSTERECTOMY WITH BILATERAL SALPINGECTOMY  AND SACROCOLPOPEXY SINGLE INCISION SLING PROCEDURE (Altis Sling) CYSTOSCOPY     Patient location during evaluation: PACU Anesthesia Type: General Level of consciousness: awake and alert, oriented and patient cooperative Pain management: pain level controlled Vital Signs Assessment: post-procedure vital signs reviewed and stable Respiratory status: spontaneous breathing, nonlabored ventilation and respiratory function stable Cardiovascular status: blood pressure returned to baseline and stable Postop Assessment: no apparent nausea or vomiting Anesthetic complications: no   No notable events documented.  Last Vitals:  Vitals:   11/21/21 1230 11/21/21 1245  BP: 139/80   Pulse: 66 65  Resp: 16 12  Temp:  36.5 C  SpO2: 93% 93%    Last Pain:  Vitals:   11/21/21 1230  TempSrc:   PainSc: [place]

## 2021-11-21 NOTE — Transfer of Care (Signed)
Immediate Anesthesia Transfer of Care Note  Patient: April Meyer May  Procedure(s) Performed: Procedure(s) with comments: XI ROBOTIC ASSISTED TOTAL HYSTERECTOMY WITH BILATERAL SALPINGECTOMY  AND SACROCOLPOPEXY (N/A) - TOTAL TIME REQUESTED IS 3.5HRS SINGLE INCISION SLING PROCEDURE (Altis Sling) (N/A) CYSTOSCOPY (N/A)  Patient Location: PACU  Anesthesia Type:General  Level of Consciousness: Alert, Awake, Oriented  Airway & Oxygen Therapy: Patient Spontanous Breathing  Post-op Assessment: Report given to RN  Post vital signs: Reviewed and stable  Last Vitals:  Vitals:   11/21/21 0528 11/21/21 1130  BP: (!) 132/90   Pulse: (!) 54   Resp: 18   Temp: 36.7 C 36.4 C  SpO2: 61%     Complications: No apparent anesthesia complications

## 2021-11-21 NOTE — Discharge Instructions (Signed)

## 2021-11-21 NOTE — Anesthesia Procedure Notes (Addendum)
Procedure Name: Intubation Date/Time: 11/21/2021 7:49 AM  Performed by: Pervis Hocking, DOPre-anesthesia Checklist: Patient identified, Patient being monitored, Timeout performed, Emergency Drugs available and Suction available Patient Re-evaluated:Patient Re-evaluated prior to induction Oxygen Delivery Method: Circle system utilized Preoxygenation: Pre-oxygenation with 100% oxygen Induction Type: IV induction Ventilation: Mask ventilation without difficulty Laryngoscope Size: Mac and 3 Grade View: Grade III Tube type: Oral Tube size: 7.0 mm Number of attempts: 2 Airway Equipment and Method: Stylet Placement Confirmation: ETT inserted through vocal cords under direct vision, positive ETCO2 and breath sounds checked- equal and bilateral Secured at: 21 cm Tube secured with: Tape Dental Injury: Teeth and Oropharynx as per pre-operative assessment  Difficulty Due To: Difficult Airway- due to anterior larynx and Difficult Airway- due to limited oral opening Comments: MDA Yeily Link successful DL but grade 3 view, recommend glidescope in future

## 2021-11-21 NOTE — Op Note (Signed)
Operative Note  Preoperative Diagnosis: anterior vaginal prolapse, posterior vaginal prolapse, uterovaginal prolapse, incomplete, and stress urinary incontinence  Postoperative Diagnosis: same  Procedures performed:  Robotic assisted total laparoscopic hysterectomy, bilateral salpingectomy, sacrocolpopexy Erenest Blank Lite Y mesh), single incision sling (Altis), cystoscopy  Implants:  Implant Name Type Inv. Item Serial No. Manufacturer Lot No. LRB No. Used Action  MESH Valli Glance [id number] - [id number] [employer] [place] [id number] N/A 1 Implanted  SLING ALTIS SYSTEM - [id number] [place] [id number] N/A 1 Implanted    Attending Surgeon: Sherlene Shams, MD  Anesthesia: General endotracheal  Findings: 1. On vaginal exam, stage II prolapse noted  2. On laparoscopy, normal appearing uterus, and bilateral fallopian tubes and ovaries  3. On cystoscopy, normal bladder and urethra without injury, lesion or foreign body. Brisk bilateral ureteral efflux present.     Specimens:  ID Type Source Tests Collected by Time Destination  1 : uterus, cervix, bilateral tubes Tissue PATH Gyn benign resection SURGICAL PATHOLOGY Jaquita Folds, MD 11/21/2021 0913     Estimated blood loss: 100 mL  IV fluids: 1500 mL  Urine output: 710 mL  Complications: none  Procedure in Detail:  After informed consent was obtained, the patient was taken to the operating room, where general anesthesia was induced and found to be adequate. She was placed in dorsolithotomy position in yellowfin stirrups. Her hips were noted not to be hyperflexed or hyperextended. Her arms were padded with gel pads and tucked to her sides. Her hands were surrounded by foam. A padded strap was placed across her chest with foam between the pad and her skin. She was noted to be appropriately positioned with all pressure points well padded and off tension. A tilt test showed  no slippage. She was prepped and draped in the usual sterile fashion. A uterine manipulator was placed in the uterus after sounding to 8 cm, an appropriately sized Koh ring was placed around the cervix, and a pneumo-occluder balloon was positioned in the vagina for later use.  A sterile Foley catheter was inserted.   0.25% plain Marcaine was injected in the umbilical area and an 8 mm skin incision was made with the scalpel.  A Veress needle was inserted into the incision, CO2 insufflation was started, a low opening pressure was noted, and pneumoperitoneum was obtained. The Veress needle was removed and a 80m robotic trocar was placed. The robotic camera was inserted and intraperitoneal placement was confirmed. Survey of the abdomen and pelvis revealed the findings as noted above. The sacrum appeared to be free of any adhesive disease. After determining placement for the other ports, Local anesthetic was injected at each site and two 8 mm incisions were made for robotic ports at 10 cm lateral to and at the level of the umbilical port. Two additional 8 mm incisions were made 10 cm lateral to these and 30 degrees down followed by 8 mm robotic ports - the right side for an assistant port. All trocars were placed sequentially under direct visualization of the camera. The patient was placed in Trendelenburg. The robot was docked on the patient's right side. Monopolar endoshears alternating with the vessel sealer were placed in the right arm, a [place] bipolar grasper was placed in the 2nd arm of the patient's left side, and a Tip up grasper was placed in the 3rd arm on the patient's left side.   Attention was then turned to the robotic hysterectomy  and salpingectomy. The right fallopian tube was grasped and the mesosalpinx was cauterized and incised. The uteroovarian ligament was cauterized and incised. The right round ligament was grasped, cauterized, and transected with electrocautery. The anterior and posterior  leaves of the broad ligament were taken down with cautery and sharp dissection. The uterine artery was skeletonized and the bladder flap was created on the right side with a combination of electrosurgery and sharp dissection. The KOH ring was identified. The right uterine artery was clamped, cauterized, and transected. In a similar fashion, the left side was taken down. Further sharp dissection with combination of cautery was performed to further develop the bladder flap. At this point, the KOH ring was completely hugging the cervix. The pneumo-occluder balloon in the vagina was inflated to maintain pneumoperitoneum. A colpotomy was performed with electrosurgical cutting current and the uterus and cervix were completely amputated from the vagina. The specimen was delivered through the vagina. The posterior portion of the vaginal cuff was then grasped and pulled up to maintain pneumoperitoneum. The pneumo-occluder balloon was then replaced in the vagina. The right hand instrument was changed to a suture-cut needle driver. The vaginal cuff was then closed using a 0 V-loc suture.    The right hand instrument was replaced with monopolar endoshears. With a lucite probe in the vagina, the anterior vaginal dissection was then performed with sharp dissection and electrosurgery. The posterior vaginal dissection was then performed with sharp dissection and electrosurgery in order to dissect the rectum away from the posterior vagina. Attention was then turned to the sacral promontory. The peritoneum overlying the sacral promontory was tented up, dissected sharply with monopolar scissors and electrosurgery using layer by layer technique. The peritoneal incision was extended down to the posterior cul-de-sac. This was performed with care to avoid the ureter on the right side and the sigmoid colon and its mesentary on the left side.  A "Y" mesh was then inserted into the abdomen after trimming to appropriate size. With the probe  in the vagina, the anterior leaf of the Y mesh was affixed to the anterior portion of the vagina using a 2-0 v-loc suture in a spiral pattern to distribute the suture evenly across the surface of the anterior mesh leaf. In a similar fashion, the posterior leaf of the Y mesh was attached to the posterior surface of the vagina with 2-0 v-loc suture. The distal end of the mesh was then brought to overlie the sacrum. The correct amount of tension was determined in order to elevate the vagina, but not put the mesh under tension. The distal end of the mesh was then affixed to the anterior longitudinal sacral ligament using two interrupted transverse stitches of CV2 Gortex. The excess distal mesh was then cut and removed. The peritoneum was reapproximated over the mesh using 2-0 monocryl. The bladder flap was incorporated to completely retroperitonealize the mesh. All pedicles were carefully inspected and noted to be hemostatic as the CO2 gas was deflated. All instruments were removed from the patient's abdomen.   The Foley catheter was removed.  A 70-degree cystoscope was introduced, and 360-degree inspection revealed no injury, lesion or foreign body in the bladder. Brisk bilateral ureteral efflux was noted with the assistance of pyridium.  The bladder was drained and the cystoscope was removed.  The Foley catheter was replaced.  The robot was undocked. The CO2 gas was removed and the ports were removed.  The skin incisions were closed with subcutaneous stitches of 4-0 Monocryl and covered  with skin glue.    Attention was turned to the sling.  A lonestar self-retraining retractor was placed with 4 stay hooks. The mid urethral area was located on the anterior vaginal wall.  Two Allis clamps were placed at the level of the midurethra. 1% lidocaine with epinephrine was injected into the vaginal mucosa. A vertical incision was made between the two clamps using a 15-blade scalpel.  Using sharp dissection, Metzenbaum  scissors were used to make a periurethral tunnel from the vaginal incision towards the pubic rami bilaterally for the future sling tracts. The bladder was ensured to be empty. The trocar and attached sling were introduced into the right side of the periurethral vaginal incision, just inferior to the pubic rami. The trocar was guided through the endopelvic fascia with a cephalad drift then toward the obturator membrane in the 10 o'clock position. The trocar was given a quarter turn and the anchor was felt to engage into the obturator membrane. The trocar was removed and this was repeated on the left side in a similar fashion.   A 70-degree cystoscope was introduced, and 360-degree inspection revealed no trauma or trocars in the bladder, with brisk bilateral ureteral efflux.  The bladder was drained and the cystoscope was removed.  The Foley catheter was reinserted.  The sling was tensioned with the prolene suture, enough to touch the urethral tissue but without tension. Crede maneuver was performed while tensioning to ensure no leakage. The prolene suture was trimmed. No sling perforation was noted in the vaginal sulcus. The vaginal mucosal edges were reapproximated using 2-0 Vicryl.  The vagina was copiously irrigated.  Hemostasis was again noted.    The patient tolerated the procedure well. She was awakened and taken to the recovery room in stable condition. Needle and sponge counts were correct x2.   Jaquita Folds, MD

## 2021-11-21 NOTE — Interval H&P Note (Signed)
History and Physical Interval Note:  11/21/2021 7:16 AM  Genevia B Mctigue  has presented today for surgery, with the diagnosis of anterior vaginal prolpase; uterovaginal prolapse, incomplete; stress urinary incontinence.  The various methods of treatment have been discussed with the patient and family. After consideration of risks, benefits and other options for treatment, the patient has consented to  Procedure(s) with comments: XI ROBOTIC ASSISTED TOTAL HYSTERECTOMY WITH BILATERAL SALPINGECTOMY  AND SACROCOLPOPEXY (N/A) - TOTAL TIME REQUESTED IS 3.5HRS TRANSVAGINAL TAPE (TVT) PROCEDURE (Altis Sling) (N/A) CYSTOSCOPY (N/A) as a surgical intervention.  The patient's history has been reviewed, patient examined, no change in status, stable for surgery.  I have reviewed the patient's chart and labs.  Questions were answered to the patient's satisfaction.     Jaquita Folds

## 2021-11-22 ENCOUNTER — Encounter (HOSPITAL_COMMUNITY): Payer: Self-pay | Admitting: Obstetrics and Gynecology

## 2021-11-22 ENCOUNTER — Telehealth: Payer: Self-pay | Admitting: Obstetrics and Gynecology

## 2021-11-22 NOTE — Telephone Encounter (Signed)
Post- Op Call  April Meyer underwent Robotic TLH, sacrocopopexy, single incision sling, cystoscopy on 11/22/21 with Dr Wannetta Sender. The patient reports that her pain is controlled. She is taking Tylenol, Ibuprofen and rotating oxycodone every 4 hours. She reports minimal vaginal bleeding. She has not had a bowel movement and is taking Miralax for a bowel regimen and wants to take a glycerine suppository. I told her I would ask Dr. Wannetta Sender. She was discharged without a catheter and will return on 01/01/2022 for a post op.   Elita Quick, CMA

## 2021-11-22 NOTE — Telephone Encounter (Signed)
April Meyer underwent Robotic TLH, sacrocopopexy, single incision sling, cystoscopy on 11/22/21.   She passed her voiding trial.  375m was backfilled into the bladder Voided 1748m PVR by bladder scan was less than 15024m  She was discharged without a catheter. Please call her for a routine post op check. Thanks!  MicJaquita FoldsD

## 2021-11-25 LAB — SURGICAL PATHOLOGY

## 2021-11-25 NOTE — Telephone Encounter (Signed)
Pt was notified.  

## 2021-11-27 ENCOUNTER — Telehealth: Payer: Self-pay | Admitting: Obstetrics and Gynecology

## 2021-11-27 ENCOUNTER — Encounter: Payer: Self-pay | Admitting: Obstetrics and Gynecology

## 2021-11-27 ENCOUNTER — Other Ambulatory Visit (HOSPITAL_BASED_OUTPATIENT_CLINIC_OR_DEPARTMENT_OTHER): Payer: Self-pay

## 2021-12-03 ENCOUNTER — Encounter: Payer: Self-pay | Admitting: *Deleted

## 2021-12-06 NOTE — Telephone Encounter (Signed)
noted 

## 2021-12-19 ENCOUNTER — Other Ambulatory Visit (HOSPITAL_BASED_OUTPATIENT_CLINIC_OR_DEPARTMENT_OTHER): Payer: Self-pay

## 2021-12-19 MED ORDER — OXYCODONE HCL 10 MG PO TABS
10.0000 mg | ORAL_TABLET | Freq: Every day | ORAL | 0 refills | Status: DC | PRN
  Filled 2021-12-26: qty 150, 30d supply, fill #0

## 2021-12-19 MED ORDER — PROMETHAZINE HCL 12.5 MG PO TABS
12.5000 mg | ORAL_TABLET | Freq: Three times a day (TID) | ORAL | 0 refills | Status: DC | PRN
  Filled 2021-12-19: qty 20, 7d supply, fill #0

## 2021-12-19 MED ORDER — OXYCODONE HCL 10 MG PO TABS
10.0000 mg | ORAL_TABLET | Freq: Four times a day (QID) | ORAL | 0 refills | Status: DC | PRN
  Filled 2022-01-28: qty 52, 13d supply, fill #0
  Filled 2022-01-28: qty 68, 17d supply, fill #0

## 2021-12-24 ENCOUNTER — Other Ambulatory Visit (HOSPITAL_BASED_OUTPATIENT_CLINIC_OR_DEPARTMENT_OTHER): Payer: Self-pay

## 2021-12-26 ENCOUNTER — Other Ambulatory Visit (HOSPITAL_BASED_OUTPATIENT_CLINIC_OR_DEPARTMENT_OTHER): Payer: Self-pay

## 2022-01-01 ENCOUNTER — Ambulatory Visit (INDEPENDENT_AMBULATORY_CARE_PROVIDER_SITE_OTHER): Payer: PPO | Admitting: Obstetrics and Gynecology

## 2022-01-01 ENCOUNTER — Encounter: Payer: Self-pay | Admitting: Obstetrics and Gynecology

## 2022-01-01 VITALS — BP 135/84 | HR 82

## 2022-01-01 DIAGNOSIS — Z9889 Other specified postprocedural states: Secondary | ICD-10-CM

## 2022-01-01 NOTE — Progress Notes (Signed)
[facility]  Date of Visit: 01/01/2022  History of Present Illness: April Meyer is a 56 y.o. female scheduled today for a post-operative visit.   Surgery: s/p Robotic assisted total laparoscopic hysterectomy, bilateral salpingectomy, sacrocolpopexy Erenest Blank Lite Y mesh), single incision sling (Altis), cystoscopy on 11/21/21  She passed her postoperative void trial.   Postoperative course has been uncomplicated.   Today she reports she is doing well.   UTI in the last 6 weeks? No  Pain? No  She has returned to her normal activity (except for postop restrictions) Vaginal bulge? No  Stress incontinence: No  Urgency/frequency: No  Urge incontinence: No  Voiding dysfunction: No  Bowel issues: No - did have some constipation initially but has improved  Subjective Success: Do you usually have a bulge or something falling out that you can see or feel in the vaginal area? No  Retreatment Success: Any retreatment with surgery or pessary for any compartment? No   Pathology results: UTERUS, CERVIX, BILATERAL FALLOPIAN TUBES, HYSTERECTOMY AND  SALPINGECTOMY:  - Endometrium: Benign inactive endometrium  - Myometrium: Adenomyosis, leiomyoma  - Bilateral fallopian tubes: Paratubal cysts  - Cervix: Unremarkable   Medications: She has a current medication list which includes the following prescription(s): acetaminophen, albuterol, buspirone, cyclobenzaprine, dexlansoprazole, ibuprofen, lamotrigine, loratadine, menthol (topical analgesic), metformin, ondansetron, oxycodone hcl, oxycodone hcl, oxycodone hcl, oxycodone hcl, [START ON 01/23/2022] oxycodone hcl, polyethylene glycol powder, promethazine, promethazine, rizatriptan, sertraline, synthroid, UNABLE TO FIND, and valacyclovir.   Allergies: Patient is allergic to advair hfa [fluticasone-salmeterol], other, prednisone, and morphine and related.   Physical Exam: BP 135/84   Pulse 82   LMP 08/10/2020   Abdomen: soft, non-tender,  without masses or organomegaly Laparoscopic Incisions: healing well, no dehiscence or erythema Pelvic Examination: Vagina: Incisions healing well. Sutures are not present at incision line and there is not granulation tissue. No tenderness along the anterior or posterior vagina. No apical tenderness. No pelvic masses. No visible or palpable mesh.  POP-Q: POP-Q  -2.5                                            Aa   -2.5                                           Ba  -9                                              C   4                                            Gh  5                                            Pb  9  tvl   -1.5                                            Ap  -1.5                                            Bp                                                 D     ---------------------------------------------------------  Assessment and Plan:  1. Post-operative state     - Pathology results were reviewed with the patient today and she verbalized understanding that the results were benign.  - Can resume regular activity including exercise. Wait an additional two weeks for intercourse.  - Discussed avoidance of heavy lifting and straining long term to reduce the risk of recurrence.  - No longer needs pap smears but recommend annual GYN/ breast exam with general gynecologist  Return as needed  Jaquita Folds, MD

## 2022-01-07 ENCOUNTER — Ambulatory Visit (HOSPITAL_COMMUNITY): Payer: PPO | Admitting: Psychiatry

## 2022-01-12 ENCOUNTER — Other Ambulatory Visit: Payer: Self-pay | Admitting: Obstetrics and Gynecology

## 2022-01-12 DIAGNOSIS — Z01818 Encounter for other preprocedural examination: Secondary | ICD-10-CM

## 2022-01-28 ENCOUNTER — Other Ambulatory Visit (HOSPITAL_BASED_OUTPATIENT_CLINIC_OR_DEPARTMENT_OTHER): Payer: Self-pay

## 2022-02-05 ENCOUNTER — Ambulatory Visit (HOSPITAL_COMMUNITY): Payer: PPO | Admitting: Psychiatry

## 2022-02-05 DIAGNOSIS — F325 Major depressive disorder, single episode, in full remission: Secondary | ICD-10-CM | POA: Diagnosis not present

## 2022-02-05 MED ORDER — SERTRALINE HCL 25 MG PO TABS: 50.0000 mg | ORAL_TABLET | Freq: Every morning | ORAL | 2 refills | Status: DC

## 2022-02-05 NOTE — Progress Notes (Signed)
Psychiatric Initial Adult Assessment   Patient Identification: April Meyer MRN:  [id number] Date of Evaluation:  02/05/2022 Referral Source: Chief Complaint:   Visit Diagnosis: Major depression  History of Present Illne   Today the patient is seen in the office and is doing very well.  Her mood is good.  She lives with a live-in boyfriend for a number of years.  She has a good relationship.  Patient still goes to hospice groups grieving for her son who died a few years ago of an overdose.  The patient is sleeping fairly well.  She has a number of significant medical problems.  She says she has a malignant brain tumor that has not changed in long.  Time.  She says it produces headaches.  The patient has a shot for headaches.  She says the weather can affect her vision and her headaches.  She claims that she is actually pretty stable.  She does not believe the tumor is getting any bigger.  She also has an arthritic condition as well.  Nonetheless the patient's mood seems to be very stable.  He is still enjoying life.  She says she is very stable.  Taking the Zoloft on a regular basis.  Associated Signs/Symptoms: Depression Symptoms:  depressed mood, (Hypo) Manic Symptoms:     Anxiety Symptoms:     Psychotic Symptoms:     PTSD Symptoms: Negative  Past Psychiatric History: Zoloft  Previous Psychotropic Medications: Yes   Substance Abuse History in the last 12 months:  Yes.    Consequences of Substance Abuse: NA  Past Medical History:  Past Medical History:  Diagnosis Date   ADHD (attention deficit hyperactivity disorder)    no meds for   Anxiety    Arnold-Chiari deformity (HCC)    Asthma    AVM (arteriovenous malformation) spine 04/21/2018   Bruises easily    Cerebral venous sinus thrombosis, remote, resolved 03/28/2013   Chronic pain    Common migraine with intractable migraine 16/60/6301   Complication of anesthesia age 55    breast lumpectomy heart rate went way down in  hospital stayed 2 days, no problems since   Complication of anesthesia    C 1 to C 3 fusion side to side and up and down limited    EDS (Ehlers-Danlos syndrome)    Eustachian tube dysfunction    Fatigue    GERD (gastroesophageal reflux disease)    Glioma (HCC)    low graded tectal plate glioma   Hemorrhoids    History of sleep apnea    no cpap used last 2 years, normal sleep study 2 yrs ago   HSV infection    Hypothyroid    Hypothyroidism    IIH (idiopathic intracranial hypertension)    Lumbar disc herniation    Migraine    Neck stiffness    Neuromuscular disorder (HCC)    Ehlers-Danlos Syndrome   Nystagmus, positional, central type 03/28/2013   Other malaise and fatigue 03/28/2013   " The patient reports feeling happy, ED, sore" on she has undergone at Chiari malformation surgery decompression in 2011. Prior to the surgery were chief complaints were weakness numbness and neck problems. She had swallowing difficulties and dizziness;  Classic Chiari presentation. But she didn't have headaches.   Photophobia    Pneumonia    Pre-diabetes    Tinnitus of both ears mainly in left ear    Urinary incontinence    Vision disturbance    Vitamin D deficiency  Weakness    Wears glasses     Past Surgical History:  Procedure Laterality Date   ARNOLD CHIARI REPAIR  2010   BLADDER SUSPENSION N/A 11/21/2021   Procedure: SINGLE INCISION SLING PROCEDURE (Altis Sling);  Surgeon: Jaquita Folds, MD;  Location: [facility];  Service: Gynecology;  Laterality: N/A;   Brain shunt  12/30/2019   vp shunt   BRAIN SURGERY     BREAST BIOPSY Left    lumpectomy age 40    BREAST EXCISIONAL BIOPSY Right yrs ago   CHOLECYSTECTOMY  age 79   laparoscopic   colonscopy  2012, 2018   CYSTOSCOPY N/A 11/21/2021   Procedure: CYSTOSCOPY;  Surgeon: Jaquita Folds, MD;  Location: [facility];  Service: Gynecology;  Laterality: N/A;   DILATION AND CURETTAGE OF UTERUS  last done yrs ago   x 2 or 3    ENDOMETRIAL ABLATION  02/09/2013   [facility]   ESOPHAGOGASTRODUODENOSCOPY N/A 02/05/2021   Procedure: ESOPHAGOGASTRODUODENOSCOPY (EGD);  Surgeon: Carol Ada, MD;  Location: [facility];  Service: Endoscopy;  Laterality: N/A;   HYSTEROSCOPY WITH D & C N/A 06/26/2020   Procedure: DILATATION AND CURETTAGE /HYSTEROSCOPY;  Surgeon: Salvadore Dom, MD;  Location: [facility];  Service: Gynecology;  Laterality: N/A;   INTRAUTERINE DEVICE INSERTION  10/2010   Mirena   IUD REMOVAL  11/2010   could not tolerate hormonal side effects   metal plate removed from head  2015   screw came loose   neck fusion c1-c3  2010   OPERATIVE ULTRASOUND N/A 06/26/2020   Procedure: OPERATIVE ULTRASOUND;  Surgeon: Salvadore Dom, MD;  Location: [facility];  Service: Gynecology;  Laterality: N/A;   TONSILLECTOMY AND ADENOIDECTOMY  age 67   UPPER GI ENDOSCOPY  2012, 2018   XI ROBOTIC ASSISTED TOTAL HYSTERECTOMY WITH SACROCOLPOPEXY N/A 11/21/2021   Procedure: XI ROBOTIC ASSISTED TOTAL HYSTERECTOMY WITH BILATERAL SALPINGECTOMY  AND SACROCOLPOPEXY;  Surgeon: Jaquita Folds, MD;  Location: [facility];  Service: Gynecology;  Laterality: N/A;  TOTAL TIME REQUESTED IS 3.5HRS    Family Psychiatric History:   Family History:  Family History  Problem Relation Age of Onset   Aortic dissection Father    Diabetes Maternal Aunt    Breast cancer Maternal Aunt        40's   Diabetes Maternal Uncle    Breast cancer Paternal Aunt 53   Diabetes Maternal Grandmother    Cancer Maternal Grandmother        SKIN AND LUNG   Diabetes Paternal Grandmother    Breast cancer Maternal Aunt        50's   Breast cancer Maternal Aunt        50's    Social History:   Social History   Socioeconomic History   Marital status: Divorced    Spouse name: Not on file   Number of children: 1   Years of education: 16   Highest education level: Not on file  Occupational History    Employer: OTHER   Tobacco Use   Smoking status: Former    Packs/day: 2.00    Years: 21.00    Total pack years: 42.00    Types: Cigarettes    Quit date: 09/09/2007    Years since quitting: 14.4   Smokeless tobacco: Never  Vaping Use   Vaping Use: Never used  Substance and Sexual Activity   Alcohol use: No    Comment: h/o binge drinking none in 10  years   Drug use: Yes    Types: Marijuana    Comment: daily  use of marijuana recently   Sexual activity: Not Currently    Partners: Male    Birth control/protection: Other-see comments    Comment: Vasectomy-1st intercourse 56 yo-More than 5 partners  Other Topics Concern   Not on file  Social History Narrative   Patient is divorced and lives at home with her one child.   Patient is disabled.   Patient is right-handed.   Patient has a college education.   Patient drinks one cup of coffee daily.   Social Determinants of Health   Financial Resource Strain: Not on file  Food Insecurity: Not on file  Transportation Needs: Not on file  Physical Activity: Not on file  Stress: Not on file  Social Connections: Not on file    Additional Social History:   Allergies:   Allergies  Allergen Reactions   Advair Hfa [Fluticasone-Salmeterol] Anaphylaxis   Other Anaphylaxis    Steroids and Zucchini    Prednisone Anaphylaxis   Morphine And Related Itching    Severe    Metabolic Disorder Labs: Lab Results  Component Value Date   HGBA1C 5.7 (H) 11/15/2021   MPG 116.89 11/15/2021   MPG 99.67 01/30/2021   No results found for: "PROLACTIN" No results found for: "CHOL", "TRIG", "HDL", "CHOLHDL", "VLDL", "LDLCALC" Lab Results  Component Value Date   TSH 16.268 (H) 01/30/2021    Therapeutic Level Labs: No results found for: "LITHIUM" No results found for: "CBMZ" No results found for: "VALPROATE"  Current Medications: Current Outpatient Medications  Medication Sig Dispense Refill   acetaminophen (TYLENOL) 500 MG tablet Take 1 tablet (500 mg  total) by mouth every 6 (six) hours as needed (pain). 30 tablet 0   albuterol (VENTOLIN HFA) 108 (90 Base) MCG/ACT inhaler Inhale 1-2 puffs into the lungs every 4 (four) hours as needed for wheezing or shortness of breath.     busPIRone (BUSPAR) 7.5 MG tablet Take 1 tablet (7.5 mg total) by mouth 2 (two) times daily. (Patient taking differently: Take 7.5 mg by mouth daily.) 60 tablet 5   cyclobenzaprine (FLEXERIL) 5 MG tablet Take 5 mg by mouth 3 (three) times daily as needed for muscle spasms.     dexlansoprazole (DEXILANT) 60 MG capsule Take 60 mg by mouth at bedtime.     ibuprofen (ADVIL) 600 MG tablet Take 1 tablet (600 mg total) by mouth every 6 (six) hours as needed. 30 tablet 0   lamoTRIgine (LAMICTAL) 25 MG tablet Take 1 tablet by mouth daily.     loratadine (CLARITIN) 10 MG tablet Take 10 mg by mouth daily.     Menthol, Topical Analgesic, (BIOFREEZE EX) Apply 1 Application topically daily as needed (pain).     metFORMIN (GLUCOPHAGE-XR) 500 MG 24 hr tablet Take 500 mg by mouth daily.     ondansetron (ZOFRAN) 8 MG tablet Take 8 mg by mouth every 8 (eight) hours as needed for nausea or vomiting.     Oxycodone HCl 10 MG TABS Take 1 tablet by mouth four times a day as needed for pain 120 tablet 0   Oxycodone HCl 10 MG TABS Take 1 tablet by mouth four times a day as needed for pain 120 tablet 0   Oxycodone HCl 10 MG TABS Take 1 tablet (10 mg total) by mouth every 4 (four) hours as needed for pain. 180 tablet 0   Oxycodone HCl 10 MG TABS Take 1 tablet (  10 mg total) by mouth 5 (five) times daily as needed for pain dose frequency change. 150 tablet 0   Oxycodone HCl 10 MG TABS Take 1 tablet (10 mg total) by mouth every 6 (six) hours as needed for pain dose frequency change. 120 tablet 0   polyethylene glycol powder (GLYCOLAX/MIRALAX) 17 GM/SCOOP powder Take 17 g by mouth daily. Drink 17g (1 scoop) dissolved in water per day. 255 g 0   promethazine (PHENERGAN) 12.5 MG tablet Take 1 tablet (12.5 mg  total) by mouth 3 (three) times daily as needed. 20 tablet 0   promethazine (PHENERGAN) 25 MG tablet Take 1 tablet (25 mg total) by mouth every 6 (six) hours as needed for nausea. Nausea 30 tablet 1   rizatriptan (MAXALT-MLT) 10 MG disintegrating tablet Take 10 mg by mouth as needed for migraine.     sertraline (ZOLOFT) 25 MG tablet Take 2 tablets (50 mg total) by mouth AC breakfast. 90 tablet 2   SYNTHROID 137 MCG tablet Take 137 mcg by mouth every morning.     UNABLE TO FIND Take 1-2 tablets by mouth 2 (two) times daily as needed (sleep/pain). Cbd gummies     valACYclovir (VALTREX) 1000 MG tablet TAKE 2 TABS (2 GRAMS) BY MOUTH NOW AND REPEAT IN 12 HOURS X 1, AS NEEDED FOR [place] 30 tablet 1   No current facility-administered medications for this visit.    Musculoskeletal: Strength & Muscle Tone: within normal limits Gait & Station: normal Patient leans: Right  Psychiatric Specialty Exam: Review of Systems  Last menstrual period 08/10/2020.There is no height or weight on file to calculate BMI.  General Appearance: Negative  Eye Contact:  Good  Speech:  Negative  Volume:  Normal  Mood:  Negative  Affect:  Congruent  Thought Process:  Goal Directed  Orientation:  Full (Time, Place, and Person)  Thought Content:  Logical  Suicidal Thoughts:  No  Homicidal Thoughts:  No  Memory:  NA  Judgement:  Good  Insight:  Good  Psychomotor Activity:  Normal  Concentration:  Concentration: Good  Recall:  Good  Fund of Knowledge:Good  Language: Good  Akathisia:  No  Handed:  Right  AIMS (if indicated):  not done  Assets:  Desire for Improvement  ADL's:  Intact  Cognition: WNL  Sleep:  Good   Screenings: PHQ2-9    Flowsheet Row Counselor from 02/18/2021 in [facility]  PHQ-2 Total Score 3  PHQ-9 Total Score 11      Flowsheet Row Pre-Admission Testing 60 from 11/15/2021 in [facility] Counselor from  02/18/2021 in [facility] ED to Hosp-Admission (Discharged) from 01/30/2021 in [facility] No Risk No Risk Error: Question 1 not populated       Assessment and Plan:   This patient's diagnosis is major depression in remission.  She will continue taking Zoloft 50 mg a day.  She will return to see me in 8 months.  She is functioning extremely well.   Jerral Ralph, MD 11/29/20233:36 PM

## 2022-02-10 ENCOUNTER — Encounter: Payer: Self-pay | Admitting: Obstetrics and Gynecology

## 2022-02-10 ENCOUNTER — Ambulatory Visit: Payer: PPO | Admitting: Obstetrics and Gynecology

## 2022-02-10 VITALS — BP 122/72 | HR 77 | Wt 226.0 lb

## 2022-02-10 DIAGNOSIS — N816 Rectocele: Secondary | ICD-10-CM

## 2022-02-10 DIAGNOSIS — K5900 Constipation, unspecified: Secondary | ICD-10-CM | POA: Diagnosis not present

## 2022-02-10 NOTE — Patient Instructions (Signed)
Try magnesium 500 mg a day for constipation. If that doesn't work try [employer], start with taking it one time a day and work up to 3 x a day if needed. If you are still having issues, take miralax.  About Rectocele  Overview  A rectocele is a type of hernia which causes different degrees of bulging of the rectal tissues into the vaginal wall.  You may even notice that it presses against the vaginal wall so much that some vaginal tissues droop outside of the opening of your vagina.  Causes of Rectocele  The most common cause is childbirth.  The muscles and ligaments in the pelvis that hold up and support the female organs and vagina become stretched and weakened during labor and delivery.  The more babies you have, the more the support tissues are stretched and weakened.  Not everyone who has a baby will develop a rectocele.  Some women have stronger supporting tissue in the pelvis and may not have as much of a problem as others.  Women who have a Cesarean section usually do not get rectocele's unless they pushed a long time prior to the cesarean delivery.  Other conditions that can cause a rectocele include chronic constipation, a chronic cough, a lot of heavy lifting, and obesity.  Older women may have this problem because the loss of female hormones causes the vaginal tissue to become weaker.  Symptoms  There may not be any symptoms.  If you do have symptoms, they may include: Pelvic pressure in the rectal area Protrusion of the lower part of the vagina through the opening of the vagina Constipation and trapping of the stool, making it difficult to have a bowel movement.  In severe cases, you may have to press on the lower part of your vagina to help push the stool out of you rectum.  This is called splinting to empty.  Diagnosing Rectocele  Your health care provider will ask about your symptoms and perform a pelvic exam.  S/he will ask you to bear down, pushing like you are having a bowel  movement so as to see how far the lower part of the vagina protrudes into the vagina and possible outside of the vagina.  Your provider will also ask you to contract the muscles of your pelvis (like you are stopping the stream in the middle of urinating) to determine the strength of your pelvic muscles.  Your provider may also do a rectal exam.  Treatment Options  If you do not have any symptoms, no treatment may be necessary.  Other treatment options include: Pelvic floor exercises: Contracting the muscles in your genital area may help strengthen your muscles and support the organs.  Be sure to get proper exercise instruction from you physical therapist. A pessary (removealbe pelvic support device) sometimes helps rectocele symptoms. Surgery: Surgical repair may be necessary. In some cases the uterus may need to be taken out ( a hysterectomy) as well.  There are many types of surgery for pelvic support problems.  Look for physicians who specialize in repair procedures.  You can take care of yourself by: Treating and preventing constipation Avoiding heavy lifting, and lifting correctly (with your legs, not with you waist or back) Treating a chronic cough or bronchitis Not smoking avoiding too much weight gain Doing pelvic floor exercises   2007, [employer] Doc.33

## 2022-02-10 NOTE — Progress Notes (Signed)
GYNECOLOGY  VISIT   HPI: 56 y.o.   Divorced White or Caucasian Not Hispanic or Latino  female   [id number] with Patient's last menstrual period was 08/10/2020.   here for  follow up from hysterectomy and  bladder sling. On 11/21/21 the patient underwent RTLH/BS/sacrocolpopexy, and sling with Dr Wannetta Sender. She states that two days after her surgery she sat down and felt a pop internally.  She notices a mild vaginal bulge when she is washing or wiping, no baseline pain.   She is voiding okay, GSI is gone. Stable mild urge incontinence.   She c/o constipation, sometimes has trouble defecating.  She has a h/o Ehlers-Danlos syndrome  GYNECOLOGIC HISTORY: Patient's last menstrual period was 08/10/2020. Contraception:hysterectomy  Menopausal hormone therapy: none         OB History     Gravida  2   Para  1   Term  1   Preterm      AB  1   Living  0      SAB      IAB      Ectopic  1   Multiple      Live Births  1              Patient Active Problem List   Diagnosis Date Noted   Narcotic overdose ([place]) 01/30/2021   DM2 (diabetes mellitus, type 2) ([place]) 01/30/2021   GERD (gastroesophageal reflux disease) 01/30/2021   Acute respiratory failure with hypoxia and hypercapnia ([place]) 01/30/2021   Raynaud's phenomenon 11/02/2020   Rotator cuff arthropathy of left shoulder 11/02/2020   Scoliosis 11/02/2020   Pain in joint involving pelvic region and thigh 11/02/2020   S/P VP shunt 06/19/2020   Attention deficit hyperactivity disorder 06/12/2020   Chronic low back pain 06/12/2020   Chronic obstructive pulmonary disease, unspecified ([place]) 06/12/2020   Compression of brain ([place]) 06/12/2020   Ehlers-Danlos syndrome, unspecified 06/12/2020   Encounter for general adult medical examination without abnormal findings 06/12/2020   Prediabetes    Hypothyroid    AVM (arteriovenous malformation) spine 04/21/2018   Common migraine with intractable migraine 04/21/2018   Other  malaise and fatigue 03/28/2013   History of Chiari malformation 03/28/2013   Hypersomnia, persistent 03/28/2013   Cerebral venous sinus thrombosis, remote, resolved 03/28/2013   Nystagmus, positional, central type 03/28/2013   Menorrhagia 07/31/2011    Past Medical History:  Diagnosis Date   ADHD (attention deficit hyperactivity disorder)    no meds for   Anxiety    Arnold-Chiari deformity (HCC)    Asthma    AVM (arteriovenous malformation) spine 04/21/2018   Bruises easily    Cerebral venous sinus thrombosis, remote, resolved 03/28/2013   Chronic pain    Common migraine with intractable migraine 63/78/5885   Complication of anesthesia age 51    breast lumpectomy heart rate went way down in hospital stayed 2 days, no problems since   Complication of anesthesia    C 1 to C 3 fusion side to side and up and down limited    EDS (Ehlers-Danlos syndrome)    Eustachian tube dysfunction    Fatigue    GERD (gastroesophageal reflux disease)    Glioma (HCC)    low graded tectal plate glioma   Hemorrhoids    History of sleep apnea    no cpap used last 2 years, normal sleep study 2 yrs ago   HSV infection    Hypothyroid    Hypothyroidism    IIH (  idiopathic intracranial hypertension)    Lumbar disc herniation    Migraine    Neck stiffness    Neuromuscular disorder (HCC)    Ehlers-Danlos Syndrome   Nystagmus, positional, central type 03/28/2013   Other malaise and fatigue 03/28/2013   " The patient reports feeling happy, ED, sore" on she has undergone at Chiari malformation surgery decompression in 2011. Prior to the surgery were chief complaints were weakness numbness and neck problems. She had swallowing difficulties and dizziness;  Classic Chiari presentation. But she didn't have headaches.   Photophobia    Pneumonia    Pre-diabetes    Tinnitus of both ears mainly in left ear    Urinary incontinence    Vision disturbance    Vitamin D deficiency    Weakness    Wears glasses      Past Surgical History:  Procedure Laterality Date   ARNOLD CHIARI REPAIR  2010   BLADDER SUSPENSION N/A 11/21/2021   Procedure: SINGLE INCISION SLING PROCEDURE ([place] Sling);  Surgeon: Jaquita Folds, MD;  Location: [facility];  Service: Gynecology;  Laterality: N/A;   Brain shunt  12/30/2019   vp shunt   BRAIN SURGERY     BREAST BIOPSY Left    lumpectomy age 9    BREAST EXCISIONAL BIOPSY Right yrs ago   CHOLECYSTECTOMY  age 77   laparoscopic   colonscopy  2012, 2018   CYSTOSCOPY N/A 11/21/2021   Procedure: CYSTOSCOPY;  Surgeon: Jaquita Folds, MD;  Location: [facility];  Service: Gynecology;  Laterality: N/A;   DILATION AND CURETTAGE OF UTERUS  last done yrs ago   x 2 or 3   ENDOMETRIAL ABLATION  02/09/2013   [facility]   ESOPHAGOGASTRODUODENOSCOPY N/A 02/05/2021   Procedure: ESOPHAGOGASTRODUODENOSCOPY (EGD);  Surgeon: Carol Ada, MD;  Location: [facility];  Service: Endoscopy;  Laterality: N/A;   HYSTEROSCOPY WITH D & C N/A 06/26/2020   Procedure: DILATATION AND CURETTAGE /HYSTEROSCOPY;  Surgeon: Salvadore Dom, MD;  Location: [place];  Service: Gynecology;  Laterality: N/A;   INTRAUTERINE DEVICE INSERTION  10/2010   Mirena   IUD REMOVAL  11/2010   could not tolerate hormonal side effects   metal plate removed from head  2015   screw came loose   neck fusion c1-c3  2010   OPERATIVE ULTRASOUND N/A 06/26/2020   Procedure: OPERATIVE ULTRASOUND;  Surgeon: Salvadore Dom, MD;  [facility];  Service: Gynecology;  Laterality: N/A;   TONSILLECTOMY AND ADENOIDECTOMY  age 67   UPPER GI ENDOSCOPY  2012, 2018   XI ROBOTIC ASSISTED TOTAL HYSTERECTOMY WITH SACROCOLPOPEXY N/A 11/21/2021   Procedure: XI ROBOTIC ASSISTED TOTAL HYSTERECTOMY WITH BILATERAL SALPINGECTOMY  AND SACROCOLPOPEXY;  Surgeon: Jaquita Folds, MD;  Location: [facility];  Service: Gynecology;  Laterality: N/A;  TOTAL TIME REQUESTED IS 3.5HRS    Current  Outpatient Medications  Medication Sig Dispense Refill   acetaminophen (TYLENOL) 500 MG tablet Take 1 tablet (500 mg total) by mouth every 6 (six) hours as needed (pain). 30 tablet 0   albuterol (VENTOLIN HFA) 108 (90 Base) MCG/ACT inhaler Inhale 1-2 puffs into the lungs every 4 (four) hours as needed for wheezing or shortness of breath.     busPIRone (BUSPAR) 7.5 MG tablet Take 1 tablet (7.5 mg total) by mouth 2 (two) times daily. (Patient taking differently: Take 7.5 mg by mouth daily.) 60 tablet 5   cyclobenzaprine (FLEXERIL) 5 MG tablet Take 5 mg by mouth 3 (three)  times daily as needed for muscle spasms.     dexlansoprazole (DEXILANT) 60 MG capsule Take 60 mg by mouth at bedtime.     ibuprofen (ADVIL) 600 MG tablet Take 1 tablet (600 mg total) by mouth every 6 (six) hours as needed. 30 tablet 0   lamoTRIgine (LAMICTAL) 25 MG tablet Take 1 tablet by mouth daily.     loratadine (CLARITIN) 10 MG tablet Take 10 mg by mouth daily.     Menthol, Topical Analgesic, (BIOFREEZE EX) Apply 1 Application topically daily as needed (pain).     metFORMIN (GLUCOPHAGE-XR) 500 MG 24 hr tablet Take 500 mg by mouth daily.     ondansetron (ZOFRAN) 8 MG tablet Take 8 mg by mouth every 8 (eight) hours as needed for nausea or vomiting.     Oxycodone HCl 10 MG TABS Take 1 tablet by mouth four times a day as needed for pain 120 tablet 0   Oxycodone HCl 10 MG TABS Take 1 tablet by mouth four times a day as needed for pain 120 tablet 0   Oxycodone HCl 10 MG TABS Take 1 tablet (10 mg total) by mouth every 4 (four) hours as needed for pain. 180 tablet 0   Oxycodone HCl 10 MG TABS Take 1 tablet (10 mg total) by mouth 5 (five) times daily as needed for pain dose frequency change. 150 tablet 0   Oxycodone HCl 10 MG TABS Take 1 tablet (10 mg total) by mouth every 6 (six) hours as needed for pain dose frequency change. 120 tablet 0   polyethylene glycol powder (GLYCOLAX/MIRALAX) 17 GM/SCOOP powder Take 17 g by mouth daily. Drink  17g (1 scoop) dissolved in water per day. 255 g 0   promethazine (PHENERGAN) 12.5 MG tablet Take 1 tablet (12.5 mg total) by mouth 3 (three) times daily as needed. 20 tablet 0   promethazine (PHENERGAN) 25 MG tablet Take 1 tablet (25 mg total) by mouth every 6 (six) hours as needed for nausea. Nausea 30 tablet 1   rizatriptan (MAXALT-MLT) 10 MG disintegrating tablet Take 10 mg by mouth as needed for migraine.     sertraline (ZOLOFT) 25 MG tablet Take 2 tablets (50 mg total) by mouth AC breakfast. 90 tablet 2   SYNTHROID 137 MCG tablet Take 137 mcg by mouth every morning.     UNABLE TO FIND Take 1-2 tablets by mouth 2 (two) times daily as needed (sleep/pain). Cbd gummies     valACYclovir (VALTREX) 1000 MG tablet TAKE 2 TABS (2 GRAMS) BY MOUTH NOW AND REPEAT IN 12 HOURS X 1, AS NEEDED FOR [place] 30 tablet 1   No current facility-administered medications for this visit.     ALLERGIES: Advair hfa [fluticasone-salmeterol], Other, Prednisone, and Morphine and related  Family History  Problem Relation Age of Onset   Aortic dissection Father    Diabetes Maternal Aunt    Breast cancer Maternal Aunt        40's   Diabetes Maternal Uncle    Breast cancer Paternal Aunt 36   Diabetes Maternal Grandmother    Cancer Maternal Grandmother        SKIN AND LUNG   Diabetes Paternal Grandmother    Breast cancer Maternal Aunt        50's   Breast cancer Maternal Aunt        50's    Social History   Socioeconomic History   Marital status: Divorced    Spouse name: Not on file  Number of children: 1   Years of education: 16   Highest education level: Not on file  Occupational History    Employer: OTHER  Tobacco Use   Smoking status: Former    Packs/day: 2.00    Years: 21.00    Total pack years: 42.00    Types: Cigarettes    Quit date: 09/09/2007    Years since quitting: 14.4   Smokeless tobacco: Never  Vaping Use   Vaping Use: Never used  Substance and Sexual Activity   Alcohol use: No     Comment: h/o binge drinking none in 10 years   Drug use: Yes    Types: Marijuana    Comment: daily  use of marijuana recently   Sexual activity: Not Currently    Partners: Male    Birth control/protection: Other-see comments    Comment: Vasectomy-1st intercourse 56 yo-More than 5 partners  Other Topics Concern   Not on file  Social History Narrative   Patient is divorced and lives at home with her one child.   Patient is disabled.   Patient is right-handed.   Patient has a college education.   Patient drinks one cup of coffee daily.   Social Determinants of Health   Financial Resource Strain: Not on file  Food Insecurity: Not on file  Transportation Needs: Not on file  Physical Activity: Not on file  Stress: Not on file  Social Connections: Not on file  Intimate Partner Violence: Not on file    Review of Systems  All other systems reviewed and are negative.   PHYSICAL EXAMINATION:    LMP 08/10/2020     General appearance: alert, cooperative and appears stated age  Pelvic: External genitalia:  no lesions              Urethra:  normal appearing urethra with no masses, tenderness or lesions              Bartholins and Skenes: normal                 Vagina: the vaginal cuff is well supported, no significant cystocele, she has a grade 2 rectocele with valsalva              Cervix: absent              Bimanual Exam:  Uterus:  uterus absent              Adnexa: no mass, fullness, tenderness                Chaperone, Gae Dry, CMA was present for exam.  1. Rectocele Not symptomatic Avoid heavy lifting and constipation  2. Constipation, unspecified constipation type Discussed fruit, fiber, fluid Try magnesium, 500 mg daily. If it doesn't work try [employer], then [employer]

## 2022-02-13 ENCOUNTER — Other Ambulatory Visit (HOSPITAL_BASED_OUTPATIENT_CLINIC_OR_DEPARTMENT_OTHER): Payer: Self-pay

## 2022-02-13 MED ORDER — OXYCODONE HCL 10 MG PO TABS
10.0000 mg | ORAL_TABLET | Freq: Four times a day (QID) | ORAL | 0 refills | Status: DC | PRN
  Filled 2022-03-31: qty 120, 30d supply, fill #0

## 2022-02-13 MED ORDER — CYCLOBENZAPRINE HCL 5 MG PO TABS
5.0000 mg | ORAL_TABLET | Freq: Three times a day (TID) | ORAL | 2 refills | Status: DC | PRN
  Filled 2022-02-13: qty 90, 30d supply, fill #0

## 2022-02-13 MED ORDER — PROMETHAZINE HCL 12.5 MG PO TABS
12.5000 mg | ORAL_TABLET | Freq: Three times a day (TID) | ORAL | 1 refills | Status: DC | PRN
  Filled 2022-02-13: qty 20, 7d supply, fill #0

## 2022-02-13 MED ORDER — OXYCODONE HCL 10 MG PO TABS
10.0000 mg | ORAL_TABLET | Freq: Four times a day (QID) | ORAL | 0 refills | Status: DC | PRN
  Filled 2022-02-28: qty 120, 30d supply, fill #0

## 2022-02-21 ENCOUNTER — Other Ambulatory Visit (HOSPITAL_BASED_OUTPATIENT_CLINIC_OR_DEPARTMENT_OTHER): Payer: Self-pay

## 2022-02-28 ENCOUNTER — Other Ambulatory Visit (HOSPITAL_BASED_OUTPATIENT_CLINIC_OR_DEPARTMENT_OTHER): Payer: Self-pay

## 2022-03-27 NOTE — Progress Notes (Deleted)
57 y.o. G59P1010 Divorced White or Caucasian Not Hispanic or Latino female here for annual exam.      Patient's last menstrual period was 08/10/2020.          Sexually active: {yes no:314532}  The current method of family planning is {contraception:315051}.    Exercising: {yes no:314532}  {types:19826} Smoker:  {YES P5382123  Health Maintenance: Pap:  03/27/21 WNL Hr Hpv Neg  05/08/16 WNL  Hr Hpv Neg,  History of abnormal Pap:  no MMG:  05/01/21 density B Bi-rads 1 neg  BMD:   none  Colonoscopy:  2018 normal f/u 10 years  TDaP:  unsure  Gardasil: n/a   reports that she quit smoking about 14 years ago. Her smoking use included cigarettes. She has a 42.00 pack-year smoking history. She has never used smokeless tobacco. She reports current drug use. Drug: Marijuana. She reports that she does not drink alcohol.  Past Medical History:  Diagnosis Date   ADHD (attention deficit hyperactivity disorder)    no meds for   Anxiety    Arnold-Chiari deformity (HCC)    Asthma    AVM (arteriovenous malformation) spine 04/21/2018   Bruises easily    Cerebral venous sinus thrombosis, remote, resolved 03/28/2013   Chronic pain    Common migraine with intractable migraine [id number]   Complication of anesthesia age 50    breast lumpectomy heart rate went way down in hospital stayed 2 days, no problems since   Complication of anesthesia    C 1 to C 3 fusion side to side and up and down limited    EDS (Ehlers-Danlos syndrome)    Eustachian tube dysfunction    Fatigue    GERD (gastroesophageal reflux disease)    Glioma (HCC)    low graded tectal plate glioma   Hemorrhoids    History of sleep apnea    no cpap used last 2 years, normal sleep study 2 yrs ago   HSV infection    Hypothyroid    Hypothyroidism    IIH (idiopathic intracranial hypertension)    Lumbar disc herniation    Migraine    Neck stiffness    Neuromuscular disorder (HCC)    Ehlers-Danlos Syndrome   Nystagmus, positional,  central type 03/28/2013   Other malaise and fatigue 03/28/2013   " The patient reports feeling happy, ED, sore" on she has undergone at Chiari malformation surgery decompression in 2011. Prior to the surgery were chief complaints were weakness numbness and neck problems. She had swallowing difficulties and dizziness;  Classic Chiari presentation. But she didn't have headaches.   Photophobia    Pneumonia    Pre-diabetes    Tinnitus of both ears mainly in left ear    Urinary incontinence    Vision disturbance    Vitamin D deficiency    Weakness    Wears glasses     Past Surgical History:  Procedure Laterality Date   ARNOLD CHIARI REPAIR  2010   BLADDER SUSPENSION N/A 11/21/2021   Procedure: SINGLE INCISION SLING PROCEDURE (Alt[place] Sling);  Surgeon: Jaquita Folds, MD;  Location: [facility];  Service: Gynecology;  Laterality: N/A;   Brain shunt  12/30/2019   vp shunt   BRAIN SURGERY     BREAST BIOPSY Left    lumpectomy age 69    BREAST EXCISIONAL BIOPSY Right yrs ago   CHOLECYSTECTOMY  age 89   laparoscopic   colonscopy  2012, 2018   CYSTOSCOPY N/A 11/21/2021   Procedure: CYSTOSCOPY;  Surgeon:  Jaquita Folds, MD;  Location: [facility];  Service: Gynecology;  Laterality: N/A;   DILATION AND CURETTAGE OF UTERUS  last done yrs ago   x 2 or 3   ENDOMETRIAL ABLATION  02/09/2013   [facility]   ESOPHAGOGASTRODUODENOSCOPY N/A 02/05/2021   Procedure: ESOPHAGOGASTRODUODENOSCOPY (EGD);  Surgeon: Carol Ada, MD;  Location: [facility];  Service: Endoscopy;  Laterality: N/A;   HYSTEROSCOPY WITH D & C N/A 06/26/2020   Procedure: DILATATION AND CURETTAGE /HYSTEROSCOPY;  Surgeon: Salvadore Dom, MD;  Location: [place];  Service: Gynecology;  Laterality: N/A;   INTRAUTERINE DEVICE INSERTION  10/2010   Mirena   IUD REMOVAL  11/2010   could not tolerate hormonal side effects   metal plate removed from head  2015   screw came loose   neck fusion c1-c3  2010    OPERATIVE ULTRASOUND N/A 06/26/2020   Procedure: OPERATIVE ULTRASOUND;  Surgeon: Salvadore Dom, MD;  Location: [facility];  Service: Gynecology;  Laterality: N/A;   TONSILLECTOMY AND ADENOIDECTOMY  age 55   UPPER GI ENDOSCOPY  2012, 2018   XI ROBOTIC ASSISTED TOTAL HYSTERECTOMY WITH SACROCOLPOPEXY N/A 11/21/2021   Procedure: XI ROBOTIC ASSISTED TOTAL HYSTERECTOMY WITH BILATERAL SALPINGECTOMY  AND SACROCOLPOPEXY;  Surgeon: Jaquita Folds, MD;  Location: [facility];  Service: Gynecology;  Laterality: N/A;  TOTAL TIME REQUESTED IS 3.5HRS    Current Outpatient Medications  Medication Sig Dispense Refill   acetaminophen (TYLENOL) 500 MG tablet Take 1 tablet (500 mg total) by mouth every 6 (six) hours as needed (pain). 30 tablet 0   albuterol (VENTOLIN HFA) 108 (90 Base) MCG/ACT inhaler Inhale 1-2 puffs into the lungs every 4 (four) hours as needed for wheezing or shortness of breath.     busPIRone (BUSPAR) 7.5 MG tablet Take 1 tablet (7.5 mg total) by mouth 2 (two) times daily. (Patient taking differently: Take 7.5 mg by mouth daily.) 60 tablet 5   cyclobenzaprine (FLEXERIL) 5 MG tablet Take 5 mg by mouth 3 (three) times daily as needed for muscle spasms.     cyclobenzaprine (FLEXERIL) 5 MG tablet Take 1 tablet (5 mg total) by mouth 3 (three) times daily as needed. 90 tablet 2   dexlansoprazole (DEXILANT) 60 MG capsule Take 60 mg by mouth at bedtime.     ibuprofen (ADVIL) 600 MG tablet Take 1 tablet (600 mg total) by mouth every 6 (six) hours as needed. 30 tablet 0   lamoTRIgine (LAMICTAL) 25 MG tablet Take 1 tablet by mouth daily.     loratadine (CLARITIN) 10 MG tablet Take 10 mg by mouth daily.     Menthol, Topical Analgesic, (BIOFREEZE EX) Apply 1 Application topically daily as needed (pain).     metFORMIN (GLUCOPHAGE-XR) 500 MG 24 hr tablet Take 500 mg by mouth daily.     ondansetron (ZOFRAN) 8 MG tablet Take 8 mg by mouth every 8 (eight) hours as needed for nausea or  vomiting.     Oxycodone HCl 10 MG TABS Take 1 tablet by mouth four times a day as needed for pain 120 tablet 0   Oxycodone HCl 10 MG TABS Take 1 tablet by mouth four times a day as needed for pain 120 tablet 0   Oxycodone HCl 10 MG TABS Take 1 tablet (10 mg total) by mouth every 4 (four) hours as needed for pain. 180 tablet 0   Oxycodone HCl 10 MG TABS Take 1 tablet (10 mg total) by mouth 5 (five)  times daily as needed for pain dose frequency change. 150 tablet 0   Oxycodone HCl 10 MG TABS Take 1 tablet (10 mg total) by mouth every 6 (six) hours as needed for pain dose frequency change. 120 tablet 0   Oxycodone HCl 10 MG TABS Take 1 tablet (10 mg total) by mouth 4 (four) times daily as needed for pain. 120 tablet 0   Oxycodone HCl 10 MG TABS Take 1 tablet (10 mg total) by mouth 4 (four) times daily as needed for pain. 120 tablet 0   polyethylene glycol powder (GLYCOLAX/MIRALAX) 17 GM/SCOOP powder Take 17 g by mouth daily. Drink 17g (1 scoop) dissolved in water per day. 255 g 0   promethazine (PHENERGAN) 12.5 MG tablet Take 1 tablet (12.5 mg total) by mouth 3 (three) times daily as needed. 20 tablet 1   promethazine (PHENERGAN) 25 MG tablet Take 1 tablet (25 mg total) by mouth every 6 (six) hours as needed for nausea. Nausea 30 tablet 1   rizatriptan (MAXALT-MLT) 10 MG disintegrating tablet Take 10 mg by mouth as needed for migraine.     sertraline (ZOLOFT) 25 MG tablet Take 2 tablets (50 mg total) by mouth AC breakfast. 90 tablet 2   SYNTHROID 137 MCG tablet Take 137 mcg by mouth every morning.     UNABLE TO FIND Take 1-2 tablets by mouth 2 (two) times daily as needed (sleep/pain). Cbd gummies     valACYclovir (VALTREX) 1000 MG tablet TAKE 2 TABS (2 GRAMS) BY MOUTH NOW AND REPEAT IN 12 HOURS X 1, AS NEEDED FOR [place] 30 tablet 1   No current facility-administered medications for this visit.    Family History  Problem Relation Age of Onset   Aortic dissection Father    Diabetes Maternal Aunt     Breast cancer Maternal Aunt        40's   Diabetes Maternal Uncle    Breast cancer Paternal Aunt 62   Diabetes Maternal Grandmother    Cancer Maternal Grandmother        SKIN AND LUNG   Diabetes Paternal Grandmother    Breast cancer Maternal Aunt        50's   Breast cancer Maternal Aunt        50's    Review of Systems  Exam:   LMP 08/10/2020   Weight change: '@WEIGHTCHANGE'$ @ Height:      Ht Readings from Last 3 Encounters:  11/21/21 '5\' 7"'$  (1.702 m)  11/15/21 '5\' 7"'$  (1.702 m)  11/01/21 '5\' 7"'$  (1.702 m)    General appearance: alert, cooperative and appears stated age Head: Normocephalic, without obvious abnormality, atraumatic Neck: no adenopathy, supple, symmetrical, trachea midline and thyroid {CHL AMB PHY EX THYROID NORM [phone]::"normal to inspection and palpation"} Lungs: clear to auscultation bilaterally Cardiovascular: regular rate and rhythm Breasts: {Exam; breast:13139::"normal appearance, no masses or tenderness"} Abdomen: soft, non-tender; non distended,  no masses,  no organomegaly Extremities: extremities normal, atraumatic, no cyanosis or edema Skin: Skin color, texture, turgor normal. No rashes or lesions Lymph nodes: Cervical, supraclavicular, and axillary nodes normal. No abnormal inguinal nodes palpated Neurologic: Grossly normal   Pelvic: External genitalia:  no lesions              Urethra:  normal appearing urethra with no masses, tenderness or lesions              Bartholins and Skenes: normal  Vagina: normal appearing vagina with normal color and discharge, no lesions              Cervix: {CHL AMB PHY EX CERVIX NORM DEFAULT:[phone]::"no lesions"}               Bimanual Exam:  Uterus:  {CHL AMB PHY EX UTERUS NORM [phone]::"normal size, contour, position, consistency, mobility, non-tender"}              Adnexa: {CHL AMB PHY EX ADNEXA NO MASS DEFAULT:[phone]::"no mass, fullness, tenderness"}                Rectovaginal: Confirms               Anus:  normal sphincter tone, no lesions  *** chaperoned for the exam.  A:  Well Woman with normal exam  P:

## 2022-03-31 ENCOUNTER — Other Ambulatory Visit: Payer: Self-pay | Admitting: Obstetrics and Gynecology

## 2022-03-31 ENCOUNTER — Other Ambulatory Visit (HOSPITAL_BASED_OUTPATIENT_CLINIC_OR_DEPARTMENT_OTHER): Payer: Self-pay

## 2022-03-31 ENCOUNTER — Ambulatory Visit: Payer: PPO | Admitting: Obstetrics and Gynecology

## 2022-03-31 DIAGNOSIS — Z1231 Encounter for screening mammogram for malignant neoplasm of breast: Secondary | ICD-10-CM

## 2022-04-02 ENCOUNTER — Ambulatory Visit: Payer: PPO | Admitting: Obstetrics and Gynecology

## 2022-04-29 ENCOUNTER — Other Ambulatory Visit (HOSPITAL_BASED_OUTPATIENT_CLINIC_OR_DEPARTMENT_OTHER): Payer: Self-pay

## 2022-04-29 ENCOUNTER — Other Ambulatory Visit (HOSPITAL_COMMUNITY): Payer: Self-pay

## 2022-04-29 MED ORDER — OXYCODONE HCL 10 MG PO TABS
10.0000 mg | ORAL_TABLET | Freq: Four times a day (QID) | ORAL | 0 refills | Status: DC | PRN
  Filled 2022-04-29: qty 120, 30d supply, fill #0

## 2022-04-29 MED ORDER — PROMETHAZINE HCL 12.5 MG PO TABS
12.5000 mg | ORAL_TABLET | Freq: Three times a day (TID) | ORAL | 1 refills | Status: DC | PRN
  Filled 2022-04-29: qty 20, 7d supply, fill #0

## 2022-04-29 MED ORDER — CYCLOBENZAPRINE HCL 5 MG PO TABS
5.0000 mg | ORAL_TABLET | Freq: Three times a day (TID) | ORAL | 2 refills | Status: DC | PRN
  Filled 2022-04-29: qty 90, 30d supply, fill #0

## 2022-05-16 ENCOUNTER — Ambulatory Visit: Payer: PPO

## 2022-05-20 NOTE — Progress Notes (Deleted)
57 y.o. G52P1010 Divorced White or Caucasian Not Hispanic or Latino female here for annual exam.      Patient's last menstrual period was 08/10/2020.          Sexually active: {yes no:314532}  The current method of family planning is {contraception:315051}.    Exercising: {yes no:314532}  {types:19826} Smoker:  {YES [id number]  Health Maintenance: Pap:  03/27/21 WNL Hr HPV neg  05/08/16 WNL  Hr Hpv Neg  History of abnormal Pap:  no MMG:  04/30/21 bi-rads 1 neg  BMD:   none  Colonoscopy: 2018 normal f/u 10 years  TDaP:  unsure  Gardasil: n/a   reports that she quit smoking about 14 years ago. Her smoking use included cigarettes. She has a 42.00 pack-year smoking history. She has never used smokeless tobacco. She reports current drug use. Drug: Marijuana. She reports that she does not drink alcohol.  Past Medical History:  Diagnosis Date   ADHD (attention deficit hyperactivity disorder)    no meds for   Anxiety    Arnold-Chiari deformity (HCC)    Asthma    AVM (arteriovenous malformation) spine 04/21/2018   Bruises easily    Cerebral venous sinus thrombosis, remote, resolved 03/28/2013   Chronic pain    Common migraine with intractable migraine [id number]   Complication of anesthesia age 48    breast lumpectomy heart rate went way down in hospital stayed 2 days, no problems since   Complication of anesthesia    C 1 to C 3 fusion side to side and up and down limited    EDS (Ehlers-Danlos syndrome)    Eustachian tube dysfunction    Fatigue    GERD (gastroesophageal reflux disease)    Glioma (HCC)    low graded tectal plate glioma   Hemorrhoids    History of sleep apnea    no cpap used last 2 years, normal sleep study 2 yrs ago   HSV infection    Hypothyroid    Hypothyroidism    IIH (idiopathic intracranial hypertension)    Lumbar disc herniation    Migraine    Neck stiffness    Neuromuscular disorder (HCC)    Ehlers-Danlos Syndrome   Nystagmus, positional, central type  03/28/2013   Other malaise and fatigue 03/28/2013   " The patient reports feeling happy, ED, sore" on she has undergone at Chiari malformation surgery decompression in 2011. Prior to the surgery were chief complaints were weakness numbness and neck problems. She had swallowing difficulties and dizziness;  Classic Chiari presentation. But she didn't have headaches.   Photophobia    Pneumonia    Pre-diabetes    Tinnitus of both ears mainly in left ear    Urinary incontinence    Vision disturbance    Vitamin D deficiency    Weakness    Wears glasses     Past Surgical History:  Procedure Laterality Date   ARNOLD CHIARI REPAIR  2010   BLADDER SUSPENSION N/A 11/21/2021   Procedure: SINGLE INCISION SLING PROCEDURE ([place] Sling);  Surgeon: Jaquita Folds, MD;  Location: [facility];  Service: Gynecology;  Laterality: N/A;   Brain shunt  12/30/2019   vp shunt   BRAIN SURGERY     BREAST BIOPSY Left    lumpectomy age 26    BREAST EXCISIONAL BIOPSY Right yrs ago   CHOLECYSTECTOMY  age 69   laparoscopic   colonscopy  2012, 2018   CYSTOSCOPY N/A 11/21/2021   Procedure: CYSTOSCOPY;  Surgeon: Jaquita Folds,  MD;  Location: [facility];  Service: Gynecology;  Laterality: N/A;   DILATION AND CURETTAGE OF UTERUS  last done yrs ago   x 2 or 3   ENDOMETRIAL ABLATION  02/09/2013   [facility]   ESOPHAGOGASTRODUODENOSCOPY N/A 02/05/2021   Procedure: ESOPHAGOGASTRODUODENOSCOPY (EGD);  Surgeon: Carol Ada, MD;  Location: [facility];  Service: Endoscopy;  Laterality: N/A;   HYSTEROSCOPY WITH D & C N/A 06/26/2020   Procedure: DILATATION AND CURETTAGE /HYSTEROSCOPY;  Surgeon: Salvadore Dom, MD;  Location: [place];  Service: Gynecology;  Laterality: N/A;   INTRAUTERINE DEVICE INSERTION  10/2010   Mirena   IUD REMOVAL  11/2010   could not tolerate hormonal side effects   metal plate removed from head  2015   screw came loose   neck fusion c1-c3  2010   OPERATIVE ULTRASOUND  N/A 06/26/2020   Procedure: OPERATIVE ULTRASOUND;  Surgeon: Salvadore Dom, MD;  Location: [facility];  Service: Gynecology;  Laterality: N/A;   TONSILLECTOMY AND ADENOIDECTOMY  age 16   UPPER GI ENDOSCOPY  2012, 2018   XI ROBOTIC ASSISTED TOTAL HYSTERECTOMY WITH SACROCOLPOPEXY N/A 11/21/2021   Procedure: XI ROBOTIC ASSISTED TOTAL HYSTERECTOMY WITH BILATERAL SALPINGECTOMY  AND SACROCOLPOPEXY;  Surgeon: Jaquita Folds, MD;  Location: [facility];  Service: Gynecology;  Laterality: N/A;  TOTAL TIME REQUESTED IS 3.5HRS    Current Outpatient Medications  Medication Sig Dispense Refill   acetaminophen (TYLENOL) 500 MG tablet Take 1 tablet (500 mg total) by mouth every 6 (six) hours as needed (pain). 30 tablet 0   albuterol (VENTOLIN HFA) 108 (90 Base) MCG/ACT inhaler Inhale 1-2 puffs into the lungs every 4 (four) hours as needed for wheezing or shortness of breath.     busPIRone (BUSPAR) 7.5 MG tablet Take 1 tablet (7.5 mg total) by mouth 2 (two) times daily. (Patient taking differently: Take 7.5 mg by mouth daily.) 60 tablet 5   cyclobenzaprine (FLEXERIL) 5 MG tablet Take 5 mg by mouth 3 (three) times daily as needed for muscle spasms.     cyclobenzaprine (FLEXERIL) 5 MG tablet Take 1 tablet (5 mg total) by mouth 3 (three) times daily as needed. 90 tablet 2   cyclobenzaprine (FLEXERIL) 5 MG tablet Take 1 tablet (5 mg total) by mouth 3 (three) times daily as needed. 90 tablet 2   dexlansoprazole (DEXILANT) 60 MG capsule Take 60 mg by mouth at bedtime.     ibuprofen (ADVIL) 600 MG tablet Take 1 tablet (600 mg total) by mouth every 6 (six) hours as needed. 30 tablet 0   lamoTRIgine (LAMICTAL) 25 MG tablet Take 1 tablet by mouth daily.     loratadine (CLARITIN) 10 MG tablet Take 10 mg by mouth daily.     Menthol, Topical Analgesic, (BIOFREEZE EX) Apply 1 Application topically daily as needed (pain).     metFORMIN (GLUCOPHAGE-XR) 500 MG 24 hr tablet Take 500 mg by mouth daily.      ondansetron (ZOFRAN) 8 MG tablet Take 8 mg by mouth every 8 (eight) hours as needed for nausea or vomiting.     Oxycodone HCl 10 MG TABS Take 1 tablet by mouth four times a day as needed for pain 120 tablet 0   Oxycodone HCl 10 MG TABS Take 1 tablet by mouth four times a day as needed for pain 120 tablet 0   Oxycodone HCl 10 MG TABS Take 1 tablet (10 mg total) by mouth every 4 (four) hours as needed for  pain. 180 tablet 0   Oxycodone HCl 10 MG TABS Take 1 tablet (10 mg total) by mouth 5 (five) times daily as needed for pain dose frequency change. 150 tablet 0   Oxycodone HCl 10 MG TABS Take 1 tablet (10 mg total) by mouth every 6 (six) hours as needed for pain dose frequency change. 120 tablet 0   Oxycodone HCl 10 MG TABS Take 1 tablet (10 mg total) by mouth 4 (four) times daily as needed for pain. 120 tablet 0   Oxycodone HCl 10 MG TABS Take 1 tablet (10 mg total) by mouth 4 (four) times daily as needed for pain. 120 tablet 0   polyethylene glycol powder (GLYCOLAX/MIRALAX) 17 GM/SCOOP powder Take 17 g by mouth daily. Drink 17g (1 scoop) dissolved in water per day. 255 g 0   promethazine (PHENERGAN) 12.5 MG tablet Take 1 tablet (12.5 mg total) by mouth 3 (three) times daily as needed. 20 tablet 1   promethazine (PHENERGAN) 12.5 MG tablet Take 1 tablet (12.5 mg total) by mouth 3 (three) times daily as needed. 20 tablet 1   promethazine (PHENERGAN) 25 MG tablet Take 1 tablet (25 mg total) by mouth every 6 (six) hours as needed for nausea. Nausea 30 tablet 1   rizatriptan (MAXALT-MLT) 10 MG disintegrating tablet Take 10 mg by mouth as needed for migraine.     sertraline (ZOLOFT) 25 MG tablet Take 2 tablets (50 mg total) by mouth AC breakfast. 90 tablet 2   SYNTHROID 137 MCG tablet Take 137 mcg by mouth every morning.     UNABLE TO FIND Take 1-2 tablets by mouth 2 (two) times daily as needed (sleep/pain). Cbd gummies     valACYclovir (VALTREX) 1000 MG tablet TAKE 2 TABS (2 GRAMS) BY MOUTH NOW AND REPEAT  IN 12 HOURS X 1, AS NEEDED FOR COLDSORE 30 tablet 1   No current facility-administered medications for this visit.    Family History  Problem Relation Age of Onset   Aortic dissection Father    Diabetes Maternal Aunt    Breast cancer Maternal Aunt        40's   Diabetes Maternal Uncle    Breast cancer Paternal Aunt 59   Diabetes Maternal Grandmother    Cancer Maternal Grandmother        SKIN AND LUNG   Diabetes Paternal Grandmother    Breast cancer Maternal Aunt        50's   Breast cancer Maternal Aunt        50's    Review of Systems  Exam:   LMP 08/10/2020   Weight change: '@WEIGHTCHANGE'$ @ Height:      Ht Readings from Last 3 Encounters:  11/21/21 '5\' 7"'$  (1.702 m)  11/15/21 '5\' 7"'$  (1.702 m)  11/01/21 '5\' 7"'$  (1.702 m)    General appearance: alert, cooperative and appears stated age Head: Normocephalic, without obvious abnormality, atraumatic Neck: no adenopathy, supple, symmetrical, trachea midline and thyroid {CHL AMB PHY EX THYROID NORM [phone]::"normal to inspection and palpation"} Lungs: clear to auscultation bilaterally Cardiovascular: regular rate and rhythm Breasts: {Exam; breast:13139::"normal appearance, no masses or tenderness"} Abdomen: soft, non-tender; non distended,  no masses,  no organomegaly Extremities: extremities normal, atraumatic, no cyanosis or edema Skin: Skin color, texture, turgor normal. No rashes or lesions Lymph nodes: Cervical, supraclavicular, and axillary nodes normal. No abnormal inguinal nodes palpated Neurologic: Grossly normal   Pelvic: External genitalia:  no lesions  Urethra:  normal appearing urethra with no masses, tenderness or lesions              Bartholins and Skenes: normal                 Vagina: normal appearing vagina with normal color and discharge, no lesions              Cervix: {CHL AMB PHY EX CERVIX NORM DEFAULT:[phone]::"no lesions"}               Bimanual Exam:  Uterus:  {CHL AMB PHY EX  UTERUS NORM [phone]::"normal size, contour, position, consistency, mobility, non-tender"}              Adnexa: {CHL AMB PHY EX ADNEXA NO MASS DEFAULT:[phone]::"no mass, fullness, tenderness"}               Rectovaginal: Confirms               Anus:  normal sphincter tone, no lesions  *** chaperoned for the exam.  A:  Well Woman with normal exam  P:

## 2022-05-27 ENCOUNTER — Other Ambulatory Visit (HOSPITAL_BASED_OUTPATIENT_CLINIC_OR_DEPARTMENT_OTHER): Payer: Self-pay

## 2022-05-27 MED ORDER — OXYCODONE HCL 10 MG PO TABS
10.0000 mg | ORAL_TABLET | Freq: Four times a day (QID) | ORAL | 0 refills | Status: DC | PRN
  Filled 2022-05-27: qty 120, 30d supply, fill #0

## 2022-05-27 MED ORDER — PROMETHAZINE HCL 12.5 MG PO TABS
12.5000 mg | ORAL_TABLET | Freq: Three times a day (TID) | ORAL | 1 refills | Status: AC | PRN
  Filled 2022-05-27: qty 20, 7d supply, fill #0

## 2022-05-27 MED ORDER — OXYCODONE HCL 10 MG PO TABS
10.0000 mg | ORAL_TABLET | Freq: Four times a day (QID) | ORAL | 0 refills | Status: DC | PRN
  Filled 2022-06-26: qty 120, 30d supply, fill #0

## 2022-05-28 ENCOUNTER — Ambulatory Visit: Payer: PPO | Admitting: Obstetrics and Gynecology

## 2022-05-30 ENCOUNTER — Other Ambulatory Visit (HOSPITAL_COMMUNITY): Payer: Self-pay | Admitting: Psychiatry

## 2022-06-24 ENCOUNTER — Ambulatory Visit
Admission: RE | Admit: 2022-06-24 | Discharge: 2022-06-24 | Disposition: A | Discharge status: Home / Self Care | Payer: PPO | Source: Ambulatory Visit | Attending: Obstetrics and Gynecology | Admitting: Obstetrics and Gynecology

## 2022-06-24 DIAGNOSIS — Z1231 Encounter for screening mammogram for malignant neoplasm of breast: Secondary | ICD-10-CM

## 2022-06-25 ENCOUNTER — Other Ambulatory Visit (HOSPITAL_BASED_OUTPATIENT_CLINIC_OR_DEPARTMENT_OTHER): Payer: Self-pay

## 2022-06-26 ENCOUNTER — Other Ambulatory Visit (HOSPITAL_BASED_OUTPATIENT_CLINIC_OR_DEPARTMENT_OTHER): Payer: Self-pay

## 2022-06-27 ENCOUNTER — Other Ambulatory Visit: Payer: Self-pay | Admitting: Obstetrics and Gynecology

## 2022-06-27 DIAGNOSIS — R928 Other abnormal and inconclusive findings on diagnostic imaging of breast: Secondary | ICD-10-CM

## 2022-07-07 ENCOUNTER — Ambulatory Visit: Payer: PPO | Admitting: Podiatry

## 2022-07-07 ENCOUNTER — Encounter: Payer: Self-pay | Admitting: Podiatry

## 2022-07-07 DIAGNOSIS — L600 Ingrowing nail: Secondary | ICD-10-CM | POA: Diagnosis not present

## 2022-07-07 NOTE — Patient Instructions (Signed)

## 2022-07-08 ENCOUNTER — Ambulatory Visit
Admission: RE | Admit: 2022-07-08 | Discharge: 2022-07-08 | Disposition: A | Discharge status: Home / Self Care | Payer: PPO | Source: Ambulatory Visit | Attending: Obstetrics and Gynecology | Admitting: Obstetrics and Gynecology

## 2022-07-08 ENCOUNTER — Encounter: Payer: Self-pay | Admitting: Obstetrics and Gynecology

## 2022-07-08 ENCOUNTER — Other Ambulatory Visit: Payer: Self-pay | Admitting: Obstetrics and Gynecology

## 2022-07-08 DIAGNOSIS — R928 Other abnormal and inconclusive findings on diagnostic imaging of breast: Secondary | ICD-10-CM

## 2022-07-08 DIAGNOSIS — N6320 Unspecified lump in the left breast, unspecified quadrant: Secondary | ICD-10-CM

## 2022-07-08 NOTE — Progress Notes (Signed)
Patient states she had subjective:   Patient ID: April Meyer, female   DOB: 57 y.o.   MRN: [id number]   HPI Ingrown thinks in the last couple years but now is on the other border of the right big toe and it has been sore.  Has tried to trim it herself and has tried to soak   ROS      Objective:  Physical Exam  Neurovascular status intact well-healed surgical sites from previous nail with an ingrown toenail deformity right hallux lateral border painful when pressed     Assessment:  Ingrown toenail deformity right hallux lateral border with pain      Plan:  H&P reviewed recommended correction of deformity explained procedure risk and patient wants surgery.  She signed consent form and today infiltrated the right big toe 60 mg like Marcaine mixture sterile prep done using sterile instrumentation remove the lateral border exposed matrix applied phenol 3 applications 30 seconds followed by alcohol lavage sterile dressing gave instructions on soaks and to wear dressing 24 hours take it off earlier if throbbing were to occur and encouraged to call questions concerns which may come up during postop

## 2022-07-10 ENCOUNTER — Ambulatory Visit
Admission: RE | Admit: 2022-07-10 | Discharge: 2022-07-10 | Disposition: A | Discharge status: Home / Self Care | Payer: PPO | Source: Ambulatory Visit | Attending: Obstetrics and Gynecology | Admitting: Obstetrics and Gynecology

## 2022-07-10 DIAGNOSIS — N6320 Unspecified lump in the left breast, unspecified quadrant: Secondary | ICD-10-CM

## 2022-07-10 DIAGNOSIS — C50919 Malignant neoplasm of unspecified site of unspecified female breast: Secondary | ICD-10-CM

## 2022-07-10 DIAGNOSIS — R928 Other abnormal and inconclusive findings on diagnostic imaging of breast: Secondary | ICD-10-CM

## 2022-07-10 HISTORY — PX: BREAST BIOPSY: SHX20

## 2022-07-10 HISTORY — DX: Malignant neoplasm of unspecified site of unspecified female breast: C50.919

## 2022-07-11 ENCOUNTER — Other Ambulatory Visit (HOSPITAL_COMMUNITY): Payer: Self-pay | Admitting: Psychiatry

## 2022-07-15 ENCOUNTER — Ambulatory Visit: Payer: Self-pay | Admitting: General Surgery

## 2022-07-15 DIAGNOSIS — C50212 Malignant neoplasm of upper-inner quadrant of left female breast: Secondary | ICD-10-CM | POA: Insufficient documentation

## 2022-07-15 DIAGNOSIS — Z170 Estrogen receptor positive status [ER+]: Secondary | ICD-10-CM | POA: Insufficient documentation

## 2022-07-16 DIAGNOSIS — C50412 Malignant neoplasm of upper-outer quadrant of left female breast: Secondary | ICD-10-CM | POA: Insufficient documentation

## 2022-07-16 NOTE — Progress Notes (Signed)
Radiation Oncology         [phone] ________________________________  Name: April Meyer        MRN: [id number]  Date of Service: 07/17/2022 DOB: 1965/07/06  NW:[id number], Marcos Eke, MD  Griselda Miner, MD     REFERRING PHYSICIAN: [name], MD   DIAGNOSIS: The encounter diagnosis was Primary malignant neoplasm of upper outer quadrant of female breast, left (HCC).   HISTORY OF PRESENT ILLNESS: April Meyer is a 57 y.o. female seen at the request of Dr. Carolynne Edouard for new diagnosis of left breast cancer.  The patient originally presented with a screening detected mass on mammogram and by additional diagnostic workup including ultrasound this was located in the 10 o'clock position measuring 6 mm in greatest dimension.  She underwent a biopsy of the mass on 07/10/2022 which showed a grade 1 invasive ductal carcinoma with associated DCIS that was intermediate grade.  Her cancer was ER/PR positive, HER2 negative with a Ki-67 of 5%.  She met with Dr. Carolynne Edouard to discuss surgical options.  She does have a history of breast cancer in her family and is meeting with genetic counseling.  She is considering options of bilateral mastectomies with reconstruction versus consideration of breast conserving surgery.  She is planning to meet with Dr. Hester Mates on 07/26/2022 to discuss reconstruction and will be meeting with Dr. Orland Dec on 07/29/2022 to discuss adjuvant medication related to treatment of breast cancer.  She is seen today to discuss the role of radiation.    PREVIOUS RADIATION THERAPY: No   PAST MEDICAL HISTORY:  Past Medical History:  Diagnosis Date   ADHD (attention deficit hyperactivity disorder)    no meds for   Anxiety    Arnold-Chiari deformity (HCC)    Asthma    AVM (arteriovenous malformation) spine 04/21/2018   Bruises easily    Cerebral venous sinus thrombosis, remote, resolved 03/28/2013   Chronic pain    Common migraine with intractable migraine 04/21/2018   Complication of anesthesia  age 80    breast lumpectomy heart rate went way down in hospital stayed 2 days, no problems since   Complication of anesthesia    C 1 to C 3 fusion side to side and up and down limited    EDS (Ehlers-Danlos syndrome)    Eustachian tube dysfunction    Fatigue    GERD (gastroesophageal reflux disease)    Glioma (HCC)    low graded tectal plate glioma   Hemorrhoids    History of sleep apnea    no cpap used last 2 years, normal sleep study 2 yrs ago   HSV infection    Hypothyroid    Hypothyroidism    IIH (idiopathic intracranial hypertension)    Lumbar disc herniation    Migraine    Neck stiffness    Neuromuscular disorder (HCC)    Ehlers-Danlos Syndrome   Nystagmus, positional, central type 03/28/2013   Other malaise and fatigue 03/28/2013   " The patient reports feeling happy, ED, sore" on she has undergone at Chiari malformation surgery decompression in 2011. Prior to the surgery were chief complaints were weakness numbness and neck problems. She had swallowing difficulties and dizziness;  Classic Chiari presentation. But she didn't have headaches.   Photophobia    Pneumonia    Pre-diabetes    Tinnitus of both ears mainly in left ear    Urinary incontinence    Vision disturbance    Vitamin D deficiency    Weakness  Wears glasses        PAST SURGICAL HISTORY: Past Surgical History:  Procedure Laterality Date   ARNOLD CHIARI REPAIR  2010   BLADDER SUSPENSION N/A 11/21/2021   Procedure: SINGLE INCISION SLING PROCEDURE (Altis Sling);  Surgeon: Marguerita Beards, MD;  Location: [facility];  Service: Gynecology;  Laterality: N/A;   Brain shunt  12/30/2019   vp shunt   BRAIN SURGERY     BREAST BIOPSY Left    lumpectomy age 69    BREAST BIOPSY Left 07/10/2022   [place] LT BREAST BX W LOC DEV 1ST LESION IMG BX SPEC US GUIDE 07/10/2022 [facility]   BREAST EXCISIONAL BIOPSY Right yrs ago   CHOLECYSTECTOMY  age 74   laparoscopic   colonscopy  2012, 2018   CYSTOSCOPY N/A  11/21/2021   Procedure: CYSTOSCOPY;  Surgeon: Marguerita Beards, MD;  Location: [facility];  Service: Gynecology;  Laterality: N/A;   DILATION AND CURETTAGE OF UTERUS  last done yrs ago   x 2 or 3   ENDOMETRIAL ABLATION  02/09/2013   [facility]   ESOPHAGOGASTRODUODENOSCOPY N/A 02/05/2021   Procedure: ESOPHAGOGASTRODUODENOSCOPY (EGD);  Surgeon: Jeani Hawking, MD;  Location: [facility];  Service: Endoscopy;  Laterality: N/A;   HYSTEROSCOPY WITH D & C N/A 06/26/2020   Procedure: DILATATION AND CURETTAGE /HYSTEROSCOPY;  Surgeon: Romualdo Bolk, MD;  Location: [facility] [place];  Service: Gynecology;  Laterality: N/A;   INTRAUTERINE DEVICE INSERTION  10/2010   Mirena   IUD REMOVAL  11/2010   could not tolerate hormonal side effects   metal plate removed from head  2015   screw came loose   neck fusion c1-c3  2010   OPERATIVE ULTRASOUND N/A 06/26/2020   Procedure: OPERATIVE ULTRASOUND;  Surgeon: Romualdo Bolk, MD;  Location: [facility];  Service: Gynecology;  Laterality: N/A;   TONSILLECTOMY AND ADENOIDECTOMY  age 30   UPPER GI ENDOSCOPY  2012, 2018   XI ROBOTIC ASSISTED TOTAL HYSTERECTOMY WITH SACROCOLPOPEXY N/A 11/21/2021   Procedure: XI ROBOTIC ASSISTED TOTAL HYSTERECTOMY WITH BILATERAL SALPINGECTOMY  AND SACROCOLPOPEXY;  Surgeon: Marguerita Beards, MD;  Location: [facility];  Service: Gynecology;  Laterality: N/A;  TOTAL TIME REQUESTED IS 3.5HRS     FAMILY HISTORY:  Family History  Problem Relation Age of Onset   Aortic dissection Father    Diabetes Maternal Aunt    Breast cancer Maternal Aunt        40's   Diabetes Maternal Uncle    Breast cancer Paternal Aunt 64   Diabetes Maternal Grandmother    Cancer Maternal Grandmother        SKIN AND LUNG   Diabetes Paternal Grandmother    Breast cancer Maternal Aunt        50's   Breast cancer Maternal Aunt        50's     SOCIAL HISTORY:  reports that she quit smoking about 14 years ago. Her  smoking use included cigarettes. She has a 42.00 pack-year smoking history. She has never used smokeless tobacco. She reports current drug use. Drug: Marijuana. She reports that she does not drink alcohol. The patient is divorced and lives in [place].   ALLERGIES: Advair hfa [fluticasone-salmeterol], Other, Prednisone, and Morphine and related   MEDICATIONS:  Current Outpatient Medications  Medication Sig Dispense Refill   acetaminophen (TYLENOL) 500 MG tablet Take 1 tablet (500 mg total) by mouth every 6 (six) hours as needed (pain). 30 tablet 0  albuterol (VENTOLIN HFA) 108 (90 Base) MCG/ACT inhaler Inhale 1-2 puffs into the lungs every 4 (four) hours as needed for wheezing or shortness of breath.     busPIRone (BUSPAR) 7.5 MG tablet Take 1 tablet (7.5 mg total) by mouth 2 (two) times daily. (Patient taking differently: Take 7.5 mg by mouth daily.) 60 tablet 5   cyclobenzaprine (FLEXERIL) 5 MG tablet Take 5 mg by mouth 3 (three) times daily as needed for muscle spasms.     cyclobenzaprine (FLEXERIL) 5 MG tablet Take 1 tablet (5 mg total) by mouth 3 (three) times daily as needed. 90 tablet 2   cyclobenzaprine (FLEXERIL) 5 MG tablet Take 1 tablet (5 mg total) by mouth 3 (three) times daily as needed. 90 tablet 2   dexlansoprazole (DEXILANT) 60 MG capsule Take 60 mg by mouth at bedtime.     ibuprofen (ADVIL) 600 MG tablet Take 1 tablet (600 mg total) by mouth every 6 (six) hours as needed. 30 tablet 0   lamoTRIgine (LAMICTAL) 25 MG tablet Take 1 tablet by mouth daily.     loratadine (CLARITIN) 10 MG tablet Take 10 mg by mouth daily.     Menthol, Topical Analgesic, (BIOFREEZE EX) Apply 1 Application topically daily as needed (pain).     metFORMIN (GLUCOPHAGE-XR) 500 MG 24 hr tablet Take 500 mg by mouth daily.     ondansetron (ZOFRAN) 8 MG tablet Take 8 mg by mouth every 8 (eight) hours as needed for nausea or vomiting.     Oxycodone HCl 10 MG TABS Take 1 tablet by mouth four times a day as  needed for pain 120 tablet 0   Oxycodone HCl 10 MG TABS Take 1 tablet by mouth four times a day as needed for pain 120 tablet 0   Oxycodone HCl 10 MG TABS Take 1 tablet (10 mg total) by mouth every 4 (four) hours as needed for pain. 180 tablet 0   Oxycodone HCl 10 MG TABS Take 1 tablet (10 mg total) by mouth 5 (five) times daily as needed for pain dose frequency change. 150 tablet 0   Oxycodone HCl 10 MG TABS Take 1 tablet (10 mg total) by mouth every 6 (six) hours as needed for pain dose frequency change. 120 tablet 0   Oxycodone HCl 10 MG TABS Take 1 tablet (10 mg total) by mouth 4 (four) times daily as needed for pain. 120 tablet 0   Oxycodone HCl 10 MG TABS Take 1 tablet (10 mg total) by mouth 4 (four) times daily as needed for pain. 120 tablet 0   Oxycodone HCl 10 MG TABS Take 1 tablet (10 mg total) by mouth 4 (four) times daily as needed for pain. 120 tablet 0   polyethylene glycol powder (GLYCOLAX/MIRALAX) 17 GM/SCOOP powder Take 17 g by mouth daily. Drink 17g (1 scoop) dissolved in water per day. 255 g 0   promethazine (PHENERGAN) 12.5 MG tablet Take 1 tablet (12.5 mg total) by mouth 3 (three) times daily as needed. 20 tablet 1   promethazine (PHENERGAN) 12.5 MG tablet Take 1 tablet (12.5 mg total) by mouth 3 (three) times daily as needed. 20 tablet 1   promethazine (PHENERGAN) 12.5 MG tablet Take 1 tablet (12.5 mg total) by mouth 3 (three) times daily as needed. 20 tablet 1   promethazine (PHENERGAN) 25 MG tablet Take 1 tablet (25 mg total) by mouth every 6 (six) hours as needed for nausea. Nausea 30 tablet 1   rizatriptan (MAXALT-MLT) 10 MG disintegrating  tablet Take 10 mg by mouth as needed for migraine.     sertraline (ZOLOFT) 25 MG tablet TAKE 2 TABLETS BY MOUTH BEFORE BREAKFAST 90 tablet 2   SYNTHROID 137 MCG tablet Take 137 mcg by mouth every morning.     UNABLE TO FIND Take 1-2 tablets by mouth 2 (two) times daily as needed (sleep/pain). Cbd gummies     valACYclovir (VALTREX) 1000 MG  tablet TAKE 2 TABS (2 GRAMS) BY MOUTH NOW AND REPEAT IN 12 HOURS X 1, AS NEEDED FOR COLDSORE 30 tablet 1   No current facility-administered medications for this visit.     REVIEW OF SYSTEMS: A complete review of systems was reviewed with the patient and pertinent positives findings are noted in the history of present illness.      PHYSICAL EXAM:  Wt Readings from Last 3 Encounters:  02/10/22 226 lb (102.5 kg)  11/21/21 225 lb 1.4 oz (102.1 kg)  11/15/21 225 lb (102.1 kg)   Temp Readings from Last 3 Encounters:  11/21/21 97.7 F (36.5 C)  11/15/21 98.8 F (37.1 C) (Oral)  02/05/21 97.9 F (36.6 C) (Oral)   BP Readings from Last 3 Encounters:  02/10/22 122/72  01/01/22 135/84  11/21/21 138/78   Pulse Readings from Last 3 Encounters:  02/10/22 77  01/01/22 82  11/21/21 62    In general this is a well appearing Caucasian female in no acute distress. She's alert and oriented x4 and appropriate throughout the examination. Cardiopulmonary assessment is negative for acute distress and she exhibits normal effort. Bilateral breast exam is deferred.    ECOG = 0  0 - Asymptomatic (Fully active, able to carry on all predisease activities without restriction)  1 - Symptomatic but completely ambulatory (Restricted in physically strenuous activity but ambulatory and able to carry out work of a light or sedentary nature. For example, light housework, office work)  2 - Symptomatic, <50% in bed during the day (Ambulatory and capable of all self care but unable to carry out any work activities. Up and about more than 50% of waking hours)  3 - Symptomatic, >50% in bed, but not bedbound (Capable of only limited self-care, confined to bed or chair 50% or more of waking hours)  4 - Bedbound (Completely disabled. Cannot carry on any self-care. Totally confined to bed or chair)  5 - Death   Santiago Glad MM, Creech RH, Tormey DC, et al. [phone]). "Toxicity and response criteria of the [facility] Group". Am. Evlyn Clines. Oncol. 5 (6): 649-55    LABORATORY DATA:  Lab Results  Component Value Date   WBC 7.8 11/15/2021   HGB 12.1 11/15/2021   HCT 37.1 11/15/2021   MCV 89.4 11/15/2021   PLT 205 11/15/2021   Lab Results  Component Value Date   NA 137 11/15/2021   K 3.7 11/15/2021   CL 109 11/15/2021   CO2 22 11/15/2021   Lab Results  Component Value Date   ALT 18 01/31/2021   AST 24 01/31/2021   ALKPHOS 56 01/31/2021   BILITOT 0.9 01/31/2021      RADIOGRAPHY: [place] LT BREAST BX W LOC DEV 1ST LESION IMG BX SPEC US GUIDE  Addendum Date: 07/14/2022   ADDENDUM REPORT: 07/14/2022 09:24 ADDENDUM: PATHOLOGY revealed: Breast, LEFT, needle core biopsy, 10:00 10 cm fn - INVASIVE DUCTAL CARCINOMA - DUCTAL CARCINOMA IN SITU, INTERMEDIATE GRADE - OVERALL GRADE: 1 Pathology results are CONCORDANT with imaging findings, per Dr. Annia Belt. Pathology results and recommendations  were discussed with patient via telephone on 07/11/2022. Patient reported biopsy site doing well with no adverse symptoms, and only slight tenderness at the site. Post biopsy care instructions were reviewed, questions were answered and my direct phone number was provided. Patient was instructed to call [facility] for any additional questions or concerns related to biopsy site. RECOMMENDATION: Surgical consultation has been requested at [facility] and they will contact patient with appointment. Patient is scheduled with Dr. Carolynne Edouard 07/15/2022. Pathology results reported by Donell Sievert, RN on 07/11/2022. Electronically Signed   By: Annia Belt M.D.   On: 07/14/2022 09:24   Result Date: 07/14/2022 CLINICAL DATA:  Patient with suspicious left breast mass 10 o'clock position. EXAM: ULTRASOUND GUIDED LEFT BREAST CORE NEEDLE BIOPSY COMPARISON:  Previous exam(s). PROCEDURE: I met with the patient and we discussed the procedure of ultrasound-guided biopsy, including benefits and  alternatives. We discussed the high likelihood of a successful procedure. We discussed the risks of the procedure, including infection, bleeding, tissue injury, clip migration, and inadequate sampling. Informed written consent was given. The usual time-out protocol was performed immediately prior to the procedure. Lesion quadrant: Upper inner quadrant Using sterile technique and 1% Lidocaine as local anesthetic, under direct ultrasound visualization, a 14 gauge spring-loaded device was used to perform biopsy of left breast mass 10 o'clock position using a medial approach. At the conclusion of the procedure coil shaped tissue marker clip was deployed into the biopsy cavity. Follow up 2 view mammogram was performed and dictated separately. IMPRESSION: Ultrasound guided biopsy of left breast mass 10 o'clock position. No apparent complications. Electronically Signed: By: Annia Belt M.D. On: 07/10/2022 15:02  MM CLIP PLACEMENT LEFT  Result Date: 07/10/2022 CLINICAL DATA:  Patient status post ultrasound biopsy left breast mass EXAM: 3D DIAGNOSTIC LEFT MAMMOGRAM POST ULTRASOUND BIOPSY COMPARISON:  Previous exam(s). FINDINGS: 3D Mammographic images were obtained following ultrasound guided biopsy of left breast mass 10 o'clock position. The biopsy marking clip is in expected position at the site of biopsy. IMPRESSION: Appropriate positioning of the coil shaped biopsy marking clip at the site of biopsy in the upper inner left breast. Final Assessment: Post Procedure Mammograms for Marker Placement Electronically Signed   By: Annia Belt M.D.   On: 07/10/2022 15:16  MM 3D DIAGNOSTIC MAMMOGRAM UNILATERAL LEFT BREAST  Result Date: 07/08/2022 CLINICAL DATA:  57 year old female for further evaluation of possible LEFT breast asymmetry on screening mammogram. EXAM: DIGITAL DIAGNOSTIC UNILATERAL LEFT MAMMOGRAM WITH TOMOSYNTHESIS; ULTRASOUND LEFT BREAST LIMITED TECHNIQUE: Left digital diagnostic mammography and breast  tomosynthesis was performed.; Targeted ultrasound examination of the left breast was performed. COMPARISON:  Previous exam(s). ACR Breast Density Category b: There are scattered areas of fibroglandular density. FINDINGS: 2D/3D spot compression views of the LEFT breast demonstrate a persistent 0.6 cm irregular mass within the posterior UPPER INNER LEFT breast. Targeted ultrasound is performed, showing a 0.6 cm mixed echogenicity shadowing mass at the 10 o'clock position of the LEFT breast 10 cm from the nipple, a good correlate to the mammographic finding. No abnormal LEFT axillary lymph nodes are noted. IMPRESSION: 1. Suspicious 0.6 cm UPPER INNER LEFT breast mass. Tissue sampling is recommended. No abnormal appearing LEFT axillary lymph nodes. RECOMMENDATION: Ultrasound-guided LEFT breast biopsy, which will be scheduled. I have discussed the findings and recommendations with the patient. If applicable, a reminder letter will be sent to the patient regarding the next appointment. BI-RADS CATEGORY  4: Suspicious. Electronically Signed   By: Tinnie Gens  Hu M.D.   On: 07/08/2022 10:18  [place] LIMITED ULTRASOUND INCLUDING AXILLA LEFT BREAST   Result Date: 07/08/2022 CLINICAL DATA:  57 year old female for further evaluation of possible LEFT breast asymmetry on screening mammogram. EXAM: DIGITAL DIAGNOSTIC UNILATERAL LEFT MAMMOGRAM WITH TOMOSYNTHESIS; ULTRASOUND LEFT BREAST LIMITED TECHNIQUE: Left digital diagnostic mammography and breast tomosynthesis was performed.; Targeted ultrasound examination of the left breast was performed. COMPARISON:  Previous exam(s). ACR Breast Density Category b: There are scattered areas of fibroglandular density. FINDINGS: 2D/3D spot compression views of the LEFT breast demonstrate a persistent 0.6 cm irregular mass within the posterior UPPER INNER LEFT breast. Targeted ultrasound is performed, showing a 0.6 cm mixed echogenicity shadowing mass at the 10 o'clock position of the LEFT breast 10  cm from the nipple, a good correlate to the mammographic finding. No abnormal LEFT axillary lymph nodes are noted. IMPRESSION: 1. Suspicious 0.6 cm UPPER INNER LEFT breast mass. Tissue sampling is recommended. No abnormal appearing LEFT axillary lymph nodes. RECOMMENDATION: Ultrasound-guided LEFT breast biopsy, which will be scheduled. I have discussed the findings and recommendations with the patient. If applicable, a reminder letter will be sent to the patient regarding the next appointment. BI-RADS CATEGORY  4: Suspicious. Electronically Signed   By: Harmon Pier M.D.   On: 07/08/2022 10:18  MM 3D SCREEN BREAST BILATERAL  Result Date: 06/26/2022 CLINICAL DATA:  Screening. EXAM: DIGITAL SCREENING BILATERAL MAMMOGRAM WITH TOMOSYNTHESIS AND CAD TECHNIQUE: Bilateral screening digital craniocaudal and mediolateral oblique mammograms were obtained. Bilateral screening digital breast tomosynthesis was performed. The images were evaluated with computer-aided detection. COMPARISON:  Previous exam(s). ACR Breast Density Category b: There are scattered areas of fibroglandular density. FINDINGS: In the left breast, a possible asymmetry warrants further evaluation. This possible asymmetry is seen within the upper inner LEFT breast, at posterior depth, cc slice 25 and MLO slice 23. In the right breast, no findings suspicious for malignancy. IMPRESSION: Further evaluation is suggested for possible asymmetry in the left breast. RECOMMENDATION: Diagnostic mammogram and possibly ultrasound of the left breast. (Code:FI-L-86M) The patient will be contacted regarding the findings, and additional imaging will be scheduled. BI-RADS CATEGORY  0: Incomplete: Need additional imaging evaluation. Electronically Signed   By: Bary Richard M.D.   On: 06/26/2022 07:55       IMPRESSION/PLAN: 1. Stage IA, [id number], grade 1 ER/PR positive invasive ductal carcinoma of the left breast. Dr. Mitzi Hansen discusses the pathology findings and reviews  the nature of early stage left breast disease.  The patient states today that she would like to proceed with a bilateral mastectomy.  She appears firm in this decision and she states that she is very comfortable with this.  Given the early nature of her breast cancer, we therefore discussed that postmastectomy radiation treatment would not be anticipated to be necessary.  We discussed that, as always, the final decision would need to account for any surgical findings, but based on her current information radiation treatment would not be necessary.  She was very pleased to hear this.  I certainly would be happy to see the patient postoperatively if necessary to further discuss her case.  In a visit lasting 35 minutes, greater than 50% of the time was spent face to face reviewing her case, as well as in preparation of, discussing, and coordinating the patient's care.  ------------------------------------------------  Radene Gunning, MD, PhD     **Disclaimer: This note was dictated with voice recognition software. Similar sounding words can inadvertently be transcribed and this note  may contain transcription errors which may not have been corrected upon publication of note.**

## 2022-07-16 NOTE — Progress Notes (Unsigned)
REFERRING PROVIDER: Griselda Miner, MD [address] [place],  [place] [address]   PRIMARY PROVIDER:  Dois Davenport, MD  PRIMARY REASON FOR VISIT:  Encounter Diagnoses  Name Primary?   Primary malignant neoplasm of upper outer quadrant of female breast, left (HCC) Yes   Family history of breast cancer    Family history of pancreatic cancer     HISTORY OF PRESENT ILLNESS:   April Meyer, a 57 y.o. female, was seen for a [place] cancer genetics consultation at the request of Dr. Carolynne Meyer due to a personal and family history of breast cancer.  April Meyer presents to clinic today to discuss the possibility of a hereditary predisposition to cancer, to discuss genetic testing, and to further clarify her future cancer risks, as well as potential cancer risks for family members.   In 2024, at the age of 16, April Meyer was diagnosed with invasive ductal carcinoma (ER+/PR+/HER2-) of the left breast. The treatment plan is pending. She was also diagnosed with a glioma around 20 years ago, which she reports has been stable.    CANCER HISTORY:  Oncology History   No history exists.     Past Medical History:  Diagnosis Date   ADHD (attention deficit hyperactivity disorder)    no meds for   Anxiety    Arnold-Chiari deformity (HCC)    Asthma    AVM (arteriovenous malformation) spine 04/21/2018   Bruises easily    Cerebral venous sinus thrombosis, remote, resolved 03/28/2013   Chronic pain    Common migraine with intractable migraine 04/21/2018   Complication of anesthesia age 39    breast lumpectomy heart rate went way down in hospital stayed 2 days, no problems since   Complication of anesthesia    C 1 to C 3 fusion side to side and up and down limited    EDS (Ehlers-Danlos syndrome)    Eustachian tube dysfunction    Fatigue    GERD (gastroesophageal reflux disease)    Glioma (HCC)    low graded tectal plate glioma   Hemorrhoids    History of sleep apnea    no cpap used  last 2 years, normal sleep study 2 yrs ago   HSV infection    Hypothyroid    Hypothyroidism    IIH (idiopathic intracranial hypertension)    Lumbar disc herniation    Migraine    Neck stiffness    Neuromuscular disorder (HCC)    Ehlers-Danlos Syndrome   Nystagmus, positional, central type 03/28/2013   Other malaise and fatigue 03/28/2013   " The patient reports feeling happy, ED, sore" on she has undergone at Chiari malformation surgery decompression in 2011. Prior to the surgery were chief complaints were weakness numbness and neck problems. She had swallowing difficulties and dizziness;  Classic Chiari presentation. But she didn't have headaches.   Photophobia    Pneumonia    Pre-diabetes    Tinnitus of both ears mainly in left ear    Urinary incontinence    Vision disturbance    Vitamin D deficiency    Weakness    Wears glasses     Past Surgical History:  Procedure Laterality Date   ARNOLD CHIARI REPAIR  2010   BLADDER SUSPENSION N/A 11/21/2021   Procedure: SINGLE INCISION SLING PROCEDURE (Altis Sling);  Surgeon: Marguerita Beards, MD;  Location: [facility];  Service: Gynecology;  Laterality: N/A;   Brain shunt  12/30/2019   vp shunt   BRAIN SURGERY  BREAST BIOPSY Left    lumpectomy age 84    BREAST BIOPSY Left 07/10/2022   [place] LT BREAST BX W LOC DEV 1ST LESION IMG BX SPEC US GUIDE 07/10/2022 [facility]   BREAST EXCISIONAL BIOPSY Right yrs ago   CHOLECYSTECTOMY  age 77   laparoscopic   colonscopy  2012, 2018   CYSTOSCOPY N/A 11/21/2021   Procedure: CYSTOSCOPY;  Surgeon: Marguerita Beards, MD;  Location: [facility];  Service: Gynecology;  Laterality: N/A;   DILATION AND CURETTAGE OF UTERUS  last done yrs ago   x 2 or 3   ENDOMETRIAL ABLATION  02/09/2013   [facility]   ESOPHAGOGASTRODUODENOSCOPY N/A 02/05/2021   Procedure: ESOPHAGOGASTRODUODENOSCOPY (EGD);  Surgeon: Jeani Hawking, MD;  Location: [facility];  Service: Endoscopy;  Laterality: N/A;   HYSTEROSCOPY  WITH D & C N/A 06/26/2020   Procedure: DILATATION AND CURETTAGE /HYSTEROSCOPY;  Surgeon: Romualdo Bolk, MD;  Location: [facility];  Service: Gynecology;  Laterality: N/A;   INTRAUTERINE DEVICE INSERTION  10/2010   Mirena   IUD REMOVAL  11/2010   could not tolerate hormonal side effects   metal plate removed from head  2015   screw came loose   neck fusion c1-c3  2010   OPERATIVE ULTRASOUND N/A 06/26/2020   Procedure: OPERATIVE ULTRASOUND;  Surgeon: Romualdo Bolk, MD;  Location: [facility];  Service: Gynecology;  Laterality: N/A;   TONSILLECTOMY AND ADENOIDECTOMY  age 22   UPPER GI ENDOSCOPY  2012, 2018   XI ROBOTIC ASSISTED TOTAL HYSTERECTOMY WITH SACROCOLPOPEXY N/A 11/21/2021   Procedure: XI ROBOTIC ASSISTED TOTAL HYSTERECTOMY WITH BILATERAL SALPINGECTOMY  AND SACROCOLPOPEXY;  Surgeon: Marguerita Beards, MD;  Location: [facility];  Service: Gynecology;  Laterality: N/A;  TOTAL TIME REQUESTED IS 3.5HRS    FAMILY HISTORY:  We obtained a detailed, 4-generation family history.  Significant diagnoses are listed below: Family History  Problem Relation Age of Onset   Aortic dissection Father    Cancer Father        GI; dx after 57   Melanoma Father        dx after 69   Breast cancer Sister 5       bilateral   Breast cancer Maternal Aunt        x3 mat aunts; dx after 59; one w/ multiple primaries   Uterine cancer Maternal Aunt        dx 14s   Breast cancer Paternal Aunt 25   Lung cancer Paternal Aunt    Pancreatic cancer Paternal Uncle 36   Diabetes Maternal Grandmother    Cancer Maternal Grandmother        skin and lung; dx after 50   Breast cancer Other        MGF's mother; dx after 73   Rectal cancer Cousin        d. 42s   Brain cancer Cousin      April Meyer is unaware of previous family history of genetic testing for hereditary cancer risks.  There is no reported Ashkenazi Jewish ancestry. There is no known consanguinity.  GENETIC  COUNSELING ASSESSMENT: April Meyer is a 57 y.o. female with a personal and family history of cancer which is somewhat suggestive of a hereditary cancer syndrome and predisposition to cancer given the presence of related cancers (breast, pancreatic, etc.) in the family. We, therefore, discussed and recommended the following at today's visit.   DISCUSSION: We discussed that 5 - 10% of cancer  is hereditary.  Most cases of hereditary breast cancer are associated with mutations in BRCA1/2.  There are other genes that can be associated with hereditary breast, pancreatic, or other cancer syndromes..  We discussed that testing is beneficial for several reasons including knowing how to follow individuals for their cancer risks, identifying whether potential treatment/surgery options would be beneficial, and understanding if other family members could be at risk for cancer and allowing them to undergo genetic testing.   We reviewed the characteristics, features and inheritance patterns of hereditary cancer syndromes. We also discussed genetic testing, including the appropriate family members to test, the process of testing, insurance coverage and turn-around-time for results. We discussed the implications of a negative, positive, carrier and/or variant of uncertain significant result. We recommended Ms. Lonigro pursue genetic testing for a panel that includes genes associated with breast, pancreatic, skin, and other cancers.   The STAT Breast cancer panel offered by [facility] includes sequencing and rearrangement analysis for the following 9 genes:  ATM, BRCA1, BRCA2, CDH1, CHEK2, PALB2, PTEN, STK11 and TP53.  With reflex to :   The Multi-Cancer + RNA Panel offered by [facility] includes sequencing and/or deletion/duplication analysis of the following 70 genes:  AIP*, ALK, APC*, ATM*, AXIN2*, BAP1*, BARD1*, BLM*, BMPR1A*, BRCA1*, BRCA2*, BRIP1*, CDC73*, CDH1*, CDK4, CDKN1B*, CDKN2A, CHEK2*, CTNNA1*, DICER1*, EPCAM (del/dup  only), EGFR, FH*, FLCN*, GREM1 (promoter dup only), HOXB13, KIT, LZTR1, MAX*, MBD4, MEN1*, MET, MITF, MLH1*, MSH2*, MSH3*, MSH6*, MUTYH*, NF1*, NF2*, NTHL1*, PALB2*, PDGFRA, PMS2*, POLD1*, POLE*, POT1*, PRKAR1A*, PTCH1*, PTEN*, RAD51C*, RAD51D*, RB1*, RET, SDHA* (sequencing only), SDHAF2*, SDHB*, SDHC*, SDHD*, SMAD4*, SMARCA4*, SMARCB1*, SMARCE1*, STK11*, SUFU*, TMEM127*, TP53*, TSC1*, TSC2*, VHL*. RNA analysis is performed for * genes.   Based on Ms. Weyers's personal and family history of cancer, she meets medical criteria for genetic testing. Despite that she meets criteria, she may still have an out of pocket cost. We discussed that if her out of pocket cost for testing is over $100, the laboratory should contact her and discuss the self-pay prices and/or patient pay assistance programs.    PLAN: After considering the risks, benefits, and limitations, Ms. Barthel provided informed consent to pursue genetic testing and the blood sample was sent to [facility] for analysis of the STAT and Multi-Cancer +RNA Panel. Results should be available within approximately 1-2 weeks' time, at which point they will be disclosed by telephone to Ms. Alcala, as will any additional recommendations warranted by these results. Ms. Bienaime will receive a summary of her genetic counseling visit and a copy of her results once available. This information will also be available in Epic.   Of note, Ms. 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  Upon taking a family history, Ms. Drucker disclosed that her father, who also had signs of hypermobility, had an aortic dissection in recent years.  We encouraged Ms. Qualls to contact her general genetics provider at [facility] to ensure they are aware of the family history, as it may impact their suspicion for other EDS subtypes.  Ms. Munsen expressed understanding.   Ms. Nembhard questions were answered to her satisfaction today. Our contact  information was provided should additional questions or concerns arise. Thank you for the referral and allowing [place] to share in the care of your patient.   Ryann Pauli M. Rennie Plowman, MS, [facility] Genetic Counselor [email or web address] (P) [phone]  The patient was seen for a total of 25 minutes in face-to-face genetic counseling.  The patient was  seen alone.  Drs. Gunnar Bulla and/or Mosetta Putt were available to discuss this case as needed.    _______________________________________________________________________ For Office Staff:  Number of people involved in session: 1 Was an Intern/ student involved with case: no

## 2022-07-16 NOTE — Progress Notes (Signed)
New Breast Cancer Diagnosis: Left breast UIQ  Did patient present with symptoms (if so, please note symptoms) or screening mammography?:Screening Mass    Location and Extent of disease :left breast. Located at 10 o'clock position, measured 6 mm in greatest dimension. Adenopathy no.  Histology per Pathology Report: grade 1, Invasive Ductal Carcinoma 07/10/2022  Receptor Status: ER(positive), PR (positive), Her2-neu (negative), Ki-(5%)  Plastic Surgery plan, if any: Dr. Ulice Bold 07/26/2022   Surgeon and surgical plan, if any:  Dr. Carolynne Edouard 07/15/2022 -I have discussed with her the different options for treatment and given her family history she is very interested in bilateral mastectomies with reconstruction. She will be a good candidate for sentinel node biopsy on the left.  -I will refer her to plastic surgery to talk about reconstruction.  -I will also refer her to medical and radiation oncology to discuss adjuvant therapy as well as to physical therapy for lymphedema evaluation.  -I will also refer her to genetics given her family history.    Medical oncologist, treatment if any:   Dr. Al Pimple 07/29/2022  Family History of Breast/Ovarian/Prostate Cancer: Sister and 4 aunts had breast cancer  Lymphedema issues, if any: No     Pain issues, if any: No    SAFETY ISSUES: Prior radiation? No Pacemaker/ICD? No Possible current pregnancy? Postmenopausal, Hysterectomy Is the patient on methotrexate? No   Current Complaints / other details:   -Genetics 07/17/2022  -Shunt

## 2022-07-17 ENCOUNTER — Ambulatory Visit
Admission: RE | Admit: 2022-07-17 | Discharge: 2022-07-17 | Disposition: A | Discharge status: Home / Self Care | Payer: PPO | Source: Ambulatory Visit | Attending: Radiation Oncology | Admitting: Radiation Oncology

## 2022-07-17 ENCOUNTER — Other Ambulatory Visit: Payer: Self-pay

## 2022-07-17 ENCOUNTER — Other Ambulatory Visit: Payer: Self-pay | Admitting: Genetic Counselor

## 2022-07-17 ENCOUNTER — Encounter: Payer: Self-pay | Admitting: Radiation Oncology

## 2022-07-17 ENCOUNTER — Inpatient Hospital Stay: Payer: PPO | Attending: Hematology and Oncology | Admitting: Genetic Counselor

## 2022-07-17 ENCOUNTER — Inpatient Hospital Stay: Payer: PPO

## 2022-07-17 VITALS — BP 128/76 | HR 64 | Temp 98.2°F | Resp 18 | Ht 67.0 in | Wt 239.0 lb

## 2022-07-17 DIAGNOSIS — Z803 Family history of malignant neoplasm of breast: Secondary | ICD-10-CM | POA: Insufficient documentation

## 2022-07-17 DIAGNOSIS — Z800 Family history of malignant neoplasm of digestive organs: Secondary | ICD-10-CM | POA: Diagnosis not present

## 2022-07-17 DIAGNOSIS — G4730 Sleep apnea, unspecified: Secondary | ICD-10-CM | POA: Insufficient documentation

## 2022-07-17 DIAGNOSIS — Q796 Ehlers-Danlos syndrome, unspecified: Secondary | ICD-10-CM | POA: Insufficient documentation

## 2022-07-17 DIAGNOSIS — Z8049 Family history of malignant neoplasm of other genital organs: Secondary | ICD-10-CM | POA: Insufficient documentation

## 2022-07-17 DIAGNOSIS — G8929 Other chronic pain: Secondary | ICD-10-CM | POA: Insufficient documentation

## 2022-07-17 DIAGNOSIS — C50412 Malignant neoplasm of upper-outer quadrant of left female breast: Secondary | ICD-10-CM

## 2022-07-17 DIAGNOSIS — Z79890 Hormone replacement therapy (postmenopausal): Secondary | ICD-10-CM | POA: Diagnosis not present

## 2022-07-17 DIAGNOSIS — Z87891 Personal history of nicotine dependence: Secondary | ICD-10-CM | POA: Diagnosis not present

## 2022-07-17 DIAGNOSIS — E559 Vitamin D deficiency, unspecified: Secondary | ICD-10-CM | POA: Diagnosis not present

## 2022-07-17 DIAGNOSIS — Q0700 Arnold-Chiari syndrome without spina bifida or hydrocephalus: Secondary | ICD-10-CM | POA: Insufficient documentation

## 2022-07-17 DIAGNOSIS — E039 Hypothyroidism, unspecified: Secondary | ICD-10-CM | POA: Diagnosis not present

## 2022-07-17 DIAGNOSIS — Z7984 Long term (current) use of oral hypoglycemic drugs: Secondary | ICD-10-CM | POA: Insufficient documentation

## 2022-07-17 DIAGNOSIS — K219 Gastro-esophageal reflux disease without esophagitis: Secondary | ICD-10-CM | POA: Diagnosis not present

## 2022-07-17 DIAGNOSIS — Z170 Estrogen receptor positive status [ER+]: Secondary | ICD-10-CM | POA: Insufficient documentation

## 2022-07-17 DIAGNOSIS — C50212 Malignant neoplasm of upper-inner quadrant of left female breast: Secondary | ICD-10-CM | POA: Insufficient documentation

## 2022-07-17 DIAGNOSIS — J45909 Unspecified asthma, uncomplicated: Secondary | ICD-10-CM | POA: Insufficient documentation

## 2022-07-17 DIAGNOSIS — Z79899 Other long term (current) drug therapy: Secondary | ICD-10-CM | POA: Insufficient documentation

## 2022-07-17 DIAGNOSIS — Z808 Family history of malignant neoplasm of other organs or systems: Secondary | ICD-10-CM | POA: Insufficient documentation

## 2022-07-17 DIAGNOSIS — Z801 Family history of malignant neoplasm of trachea, bronchus and lung: Secondary | ICD-10-CM | POA: Diagnosis not present

## 2022-07-17 DIAGNOSIS — Z86718 Personal history of other venous thrombosis and embolism: Secondary | ICD-10-CM | POA: Insufficient documentation

## 2022-07-17 LAB — GENETIC SCREENING ORDER

## 2022-07-18 ENCOUNTER — Encounter: Payer: Self-pay | Admitting: Genetic Counselor

## 2022-07-22 NOTE — Therapy (Signed)
OUTPATIENT PHYSICAL THERAPY BREAST CANCER BASELINE EVALUATION   Patient Name: April Meyer MRN: [id number] DOB:[birth year], 57 y.o., female Today's Date: 07/23/2022  END OF SESSION:  PT End of Session - 07/23/22 0952     Visit Number 1    Number of Visits 2    Date for PT Re-Evaluation 09/17/22    PT Start Time 0900    PT Stop Time 0950    PT Time Calculation (min) 50 min    Activity Tolerance Patient tolerated treatment well    Behavior During Therapy [facility] for tasks assessed/performed             Past Medical History:  Diagnosis Date   ADHD (attention deficit hyperactivity disorder)    no meds for   Anxiety    Arnold-Chiari deformity (HCC)    Asthma    AVM (arteriovenous malformation) spine 04/21/2018   Breast cancer (HCC) 07/10/2022   Left   Bruises easily    Cerebral venous sinus thrombosis, remote, resolved 03/28/2013   Chronic pain    Common migraine with intractable migraine 04/21/2018   Complication of anesthesia age 31    breast lumpectomy heart rate went way down in hospital stayed 2 days, no problems since   Complication of anesthesia    C 1 to C 3 fusion side to side and up and down limited    EDS (Ehlers-Danlos syndrome)    Eustachian tube dysfunction    Fatigue    GERD (gastroesophageal reflux disease)    Glioma (HCC)    low graded tectal plate glioma   Hemorrhoids    History of sleep apnea    no cpap used last 2 years, normal sleep study 2 yrs ago   HSV infection    Hypothyroid    Hypothyroidism    IIH (idiopathic intracranial hypertension)    Lumbar disc herniation    Migraine    Neck stiffness    Neuromuscular disorder (HCC)    Ehlers-Danlos Syndrome   Nystagmus, positional, central type 03/28/2013   Other malaise and fatigue 03/28/2013   " The patient reports feeling happy, ED, sore" on she has undergone at Chiari malformation surgery decompression in 2011. Prior to the surgery were chief complaints were weakness numbness and neck problems.  She had swallowing difficulties and dizziness;  Classic Chiari presentation. But she didn't have headaches.   Photophobia    Pneumonia    Pre-diabetes    Tinnitus of both ears mainly in left ear    Urinary incontinence    Vision disturbance    Vitamin D deficiency    Weakness    Wears glasses    Past Surgical History:  Procedure Laterality Date   ARNOLD CHIARI REPAIR  2010   BLADDER SUSPENSION N/A 11/21/2021   Procedure: SINGLE INCISION SLING PROCEDURE (Altis Sling);  Surgeon: Marguerita Beards, MD;  Location: [facility];  Service: Gynecology;  Laterality: N/A;   Brain shunt  12/30/2019   vp shunt   BRAIN SURGERY     BREAST BIOPSY Left    lumpectomy age 82    BREAST BIOPSY Left 07/10/2022   [place] LT BREAST BX W LOC DEV 1ST LESION IMG BX SPEC US GUIDE 07/10/2022 [facility]   BREAST EXCISIONAL BIOPSY Right yrs ago   CHOLECYSTECTOMY  age 34   laparoscopic   colonscopy  2012, 2018   CYSTOSCOPY N/A 11/21/2021   Procedure: CYSTOSCOPY;  Surgeon: Marguerita Beards, MD;  Location: [facility];  Service: Gynecology;  Laterality: N/A;  DILATION AND CURETTAGE OF UTERUS  last done yrs ago   x 2 or 3   ENDOMETRIAL ABLATION  02/09/2013   [employer]   ESOPHAGOGASTRODUODENOSCOPY N/A 02/05/2021   Procedure: ESOPHAGOGASTRODUODENOSCOPY (EGD);  Surgeon: Jeani Hawking, MD;  Location: [facility];  Service: Endoscopy;  Laterality: N/A;   HYSTEROSCOPY WITH D & C N/A 06/26/2020   Procedure: DILATATION AND CURETTAGE /HYSTEROSCOPY;  Surgeon: Romualdo Bolk, MD;  Location: [facility];  Service: Gynecology;  Laterality: N/A;   INTRAUTERINE DEVICE INSERTION  10/2010   Mirena   IUD REMOVAL  11/2010   could not tolerate hormonal side effects   metal plate removed from head  2015   screw came loose   neck fusion c1-c3  2010   OPERATIVE ULTRASOUND N/A 06/26/2020   Procedure: OPERATIVE ULTRASOUND;  Surgeon: Romualdo Bolk, MD;  Location: [facility];  Service:  Gynecology;  Laterality: N/A;   TONSILLECTOMY AND ADENOIDECTOMY  age 21   UPPER GI ENDOSCOPY  2012, 2018   XI ROBOTIC ASSISTED TOTAL HYSTERECTOMY WITH SACROCOLPOPEXY N/A 11/21/2021   Procedure: XI ROBOTIC ASSISTED TOTAL HYSTERECTOMY WITH BILATERAL SALPINGECTOMY  AND SACROCOLPOPEXY;  Surgeon: Marguerita Beards, MD;  Location: [facility];  Service: Gynecology;  Laterality: N/A;  TOTAL TIME REQUESTED IS 3.5HRS   Patient Active Problem List   Diagnosis Date Noted   Primary malignant neoplasm of upper outer quadrant of female breast, left (HCC) 07/16/2022   Venous insufficiency (chronic) (peripheral) 07/15/2021   Varicose veins of bilateral lower extremities with pain 06/10/2021   Narcotic overdose (HCC) 01/30/2021   DM2 (diabetes mellitus, type 2) (HCC) 01/30/2021   GERD (gastroesophageal reflux disease) 01/30/2021   Acute respiratory failure with hypoxia and hypercapnia (HCC) 01/30/2021   Raynaud's phenomenon 11/02/2020   Rotator cuff arthropathy of left shoulder 11/02/2020   Scoliosis 11/02/2020   Pain in joint involving pelvic region and thigh 11/02/2020   S/P VP shunt 06/19/2020   Attention deficit hyperactivity disorder 06/12/2020   Chronic low back pain 06/12/2020   Chronic obstructive pulmonary disease, unspecified (HCC) 06/12/2020   Compression of brain (HCC) 06/12/2020   Ehlers-Danlos syndrome, unspecified 06/12/2020   Encounter for general adult medical examination without abnormal findings 06/12/2020   Prediabetes    Hypothyroid    AVM (arteriovenous malformation) spine 04/21/2018   Common migraine with intractable migraine 04/21/2018   Other malaise and fatigue 03/28/2013   History of Chiari malformation 03/28/2013   Hypersomnia, persistent 03/28/2013   Cerebral venous sinus thrombosis, remote, resolved 03/28/2013   Nystagmus, positional, central type 03/28/2013   Menorrhagia 07/31/2011    PCP: Nadyne Coombes, MD  REFERRING PROVIDER: Dr [name]  REFERRING DIAG:  Left Breast Cancer  THERAPY DIAG:  Malignant neoplasm of upper-inner quadrant of left breast in female, estrogen receptor positive (HCC)  Abnormal posture  Rationale for Evaluation and Treatment: Rehabilitation  ONSET DATE: 07/14/2022  SUBJECTIVE:  SUBJECTIVE STATEMENT: Patient reports she is here today to be seen by her medical team for her newly diagnosed left breast cancer. She has Carylon Perches Danlos Syndrome and difficulty with left shoulder dislocations. Also has a leaking shunt that will require surgery.   PERTINENT HISTORY:  Patient was diagnosed on 07/14/22 with left grade 1 IDC and DCIS. It measures 6 mm and is located in the upper-inner quadrant. It is ER+, PR+, Her 2- with a Ki67 of 5%. She has a strong history of cancer in her family including a sister and 4 aunts. She is planning to have a bilateral mastectomy with reconstruction but does not have a date yet.  PATIENT GOALS:   reduce lymphedema risk and learn post op HEP.   PAIN:  Are you having pain? No  PRECAUTIONS: Active CA, Ehlers Danlos Syndrome: dislocation risk especially left shoulder, Arnold Chiari Malformation, AVM, ADHD,migraines, shunt that runs through inner aspect of right breast, Cervical Fusion C1,2,3 Presently having leakage from her shunt but can't get it fixed until after breast surgery  HAND DOMINANCE: right  WEIGHT BEARING RESTRICTIONS: No  FALLS:  Has patient fallen in last 6 months? Yes. Number of falls 2  LIVING ENVIRONMENT: Patient lives with: live in boyfriend, niece Lives in: House/apartment   OCCUPATION: works for [employer]  LEISURE: hanging out with friends, hard to read due to Nystagmus  PRIOR LEVEL OF FUNCTION: Independent   OBJECTIVE:  COGNITION: Overall cognitive status: Within functional  limits for tasks assessed    POSTURE:  Forward head and rounded shoulders posture  UPPER EXTREMITY AROM/PROM: Caution due to Ehlers Danlos Syndrome  A/PROM RIGHT   eval   Shoulder extension 62  Shoulder flexion 161  Shoulder abduction 174  Shoulder internal rotation 44  Shoulder external rotation 79    (Blank rows = not tested)  A/PROM LEFT   eval  Shoulder extension 55  Shoulder flexion 150  Shoulder abduction 146  Shoulder internal rotation 45 caution  Shoulder external rotation 89 caution to avoid dislocation    (Blank rows = not tested)  CERVICAL AROM: Very restricted due to fusion (C1,2,3):   UPPER EXTREMITY STRENGTH: NT  LYMPHEDEMA ASSESSMENTS:   LANDMARK RIGHT   eval  10 cm proximal to olecranon process 36.3  Olecranon process 30.6  10 cm proximal to ulnar styloid process 27.0  Just proximal to ulnar styloid process 18.6  Across hand at thumb web space 20.9  At base of 2nd digit 6.9  (Blank rows = not tested)  LANDMARK LEFT   eval  10 cm proximal to olecranon process 36  Olecranon process 30.0  10 cm proximal to ulnar styloid process 26.7  Just proximal to ulnar styloid process 17.9  Across hand at thumb web space 20.0  At base of 2nd digit 7.0  (Blank rows = not tested)  L-DEX LYMPHEDEMA SCREENING:  The patient was assessed using the L-Dex machine today to produce a lymphedema index baseline score. The patient will be reassessed on a regular basis (typically every 3 months) to obtain new L-Dex scores. If the score is > 6.5 points away from his/her baseline score indicating onset of subclinical lymphedema, it will be recommended to wear a compression garment for 4 weeks, 12 hours per day and then be reassessed. If the score continues to be > 6.5 points from baseline at reassessment, we will initiate lymphedema treatment. Assessing in this manner has a 95% rate of preventing clinically significant lymphedema.   L-DEX FLOWSHEETS - 07/23/22  0900        L-DEX LYMPHEDEMA SCREENING   Measurement Type Unilateral    L-DEX MEASUREMENT EXTREMITY Upper Extremity    POSITION  Standing    DOMINANT SIDE Right    At Risk Side Left    BASELINE SCORE (UNILATERAL) -4.2             QUICK DASH SURVEY: 34 % due to Ehlers Danlos syndrome and dislocation problems especially with left shoulder  PATIENT EDUCATION:  Education details: Lymphedema risk reduction and post op shoulder/posture HEP with caution due to EDS, ABC class, Compression bra, SOZO Person educated: Patient Education method: Explanation, Demonstration, Handout Education comprehension: Patient verbalized understanding and returned demonstration  HOME EXERCISE PROGRAM: Patient was instructed today in a home exercise program today for post op shoulder range of motion. These included active assist shoulder flexion in sitting, scapular retraction, wall walking with shoulder abduction, and hands behind head external rotation.  She was encouraged to do these twice a day, holding 3 seconds and repeating 5 times when permitted by her physician.   ASSESSMENT:  CLINICAL IMPRESSION: Pts multidisciplinary medical team met prior to her assessments to determine a recommended treatment plan. She is planning to have Bilateral Mastectomies with left SLNB and immediate reconstruction, however, no date is set yet.Marland Kitchen She will benefit from a post op PT reassessment to determine needs and from L-Dex screens every 3 months for 2 years to detect subclinical lymphedema.  Pt will benefit from skilled therapeutic intervention to improve on the following deficits: Decreased knowledge of precautions, impaired UE functional use, pain, decreased ROM, postural dysfunction.   PT treatment/interventions: ADL/self-care home management, pt/family education, therapeutic exercise  REHAB POTENTIAL: Excellent  CLINICAL DECISION MAKING: Stable/uncomplicated  EVALUATION COMPLEXITY: Low   GOALS: Goals reviewed with  patient? YES  LONG TERM GOALS: (STG=LTG)    Name Target Date Goal status  1 Pt will be able to verbalize understanding of pertinent lymphedema risk reduction practices relevant to her dx specifically related to skin care.  Baseline:  No knowledge 07/23/2022 Achieved at eval  2 Pt will be able to return demo and/or verbalize understanding of the post op HEP related to regaining shoulder ROM. Baseline:  No knowledge 07/23/2022 Achieved at eval  3 Pt will be able to verbalize understanding of the importance of attending the post op After Breast CA Class for further lymphedema risk reduction education and therapeutic exercise.  Baseline:  No knowledge 07/23/2022 Achieved at eval  4 Pt will demo she has regained full shoulder ROM and function post operatively compared to baselines.  Baseline: See objective measurements taken today. 09/17/2022 INITIAL    PLAN:  PT FREQUENCY/DURATION: EVAL and 1 follow up appointment.   PLAN FOR NEXT SESSION: will reassess 3-4 weeks post op to determine needs.   Patient will follow up at outpatient cancer rehab 3-4 weeks following surgery.  If the patient requires physical therapy at that time, a specific plan will be dictated and sent to the referring physician for approval. The patient was educated today on appropriate basic range of motion exercises to begin post operatively and the importance of attending the After Breast Cancer class following surgery.  Patient was educated today on lymphedema risk reduction practices as it pertains to recommendations that will benefit the patient immediately following surgery.  She verbalized good understanding.    Physical Therapy Information for After Breast Cancer Surgery/Treatment:  Lymphedema is a swelling condition that you may be at risk for in your arm  if you have lymph nodes removed from the armpit area.  After a sentinel node biopsy, the risk is approximately 5-9% and is higher after an axillary node dissection.  There  is treatment available for this condition and it is not life-threatening.  Contact your physician or physical therapist with concerns. You may begin the 4 shoulder/posture exercises (see additional sheet) when permitted by your physician (typically a week after surgery).  If you have drains, you may need to wait until those are removed before beginning range of motion exercises.  A general recommendation is to not lift your arms above shoulder height until drains are removed.  These exercises should be done to your tolerance and gently.  This is not a "no pain/no gain" type of recovery so listen to your body and stretch into the range of motion that you can tolerate, stopping if you have pain.  If you are having immediate reconstruction, ask your plastic surgeon about doing exercises as he or she may want you to wait. We encourage you to attend the free one time ABC (After Breast Cancer) class offered by [facility].  You will learn information related to lymphedema risk, prevention and treatment and additional exercises to regain mobility following surgery.  You can call [phone] for more information.  This is offered the 1st and 3rd Monday of each month.  You only attend the class one time. While undergoing any medical procedure or treatment, try to avoid blood pressure being taken or needle sticks from occurring on the arm on the side of cancer.   This recommendation begins after surgery and continues for the rest of your life.  This may help reduce your risk of getting lymphedema (swelling in your arm). An excellent resource for those seeking information on lymphedema is the National Lymphedema Network's web site. It can be accessed at www.lymphnet.org If you notice swelling in your hand, arm or breast at any time following surgery (even if it is many years from now), please contact your doctor or physical therapist to discuss this.  Lymphedema can be treated at any time but it  is easier for you if it is treated early on.  If you feel like your shoulder motion is not returning to normal in a reasonable amount of time, please contact your surgeon or physical therapist.  [facility] [phone]. [address], [place] [address]  ABC CLASS After Breast Cancer Class  After Breast Cancer Class is a specially designed exercise class to assist you in a safe recover after having breast cancer surgery.  In this class you will learn how to get back to full function whether your drains were just removed or if you had surgery a month ago.  This one-time class is held the 1st and 3rd Monday of every month from 11:00 a.m. until 12:00 noon virtually.  This class is FREE and space is limited. For more information or to register for the next available class, call [phone].  Class Goals  Understand specific stretches to improve the flexibility of you chest and shoulder. Learn ways to safely strengthen your upper body and improve your posture. Understand the warning signs of infection and why you may be at risk for an arm infection. Learn about Lymphedema and prevention.  ** You do not attend this class until after surgery.  Drains must be removed to participate  Patient was instructed today in a home exercise program today for  post op shoulder range of motion. These included active assist shoulder flexion in sitting, scapular retraction, wall walking with shoulder abduction, and hands behind head external rotation.  She was encouraged to do these twice a day, holding 3 seconds and repeating 5 times when permitted by her physician.    Waynette Buttery, PT 07/23/2022, 9:58 AM

## 2022-07-23 ENCOUNTER — Other Ambulatory Visit: Payer: Self-pay

## 2022-07-23 ENCOUNTER — Ambulatory Visit: Payer: PPO | Attending: General Surgery

## 2022-07-23 ENCOUNTER — Other Ambulatory Visit (HOSPITAL_BASED_OUTPATIENT_CLINIC_OR_DEPARTMENT_OTHER): Payer: Self-pay

## 2022-07-23 DIAGNOSIS — R293 Abnormal posture: Secondary | ICD-10-CM | POA: Insufficient documentation

## 2022-07-23 DIAGNOSIS — Z170 Estrogen receptor positive status [ER+]: Secondary | ICD-10-CM | POA: Insufficient documentation

## 2022-07-23 DIAGNOSIS — Q796 Ehlers-Danlos syndrome, unspecified: Secondary | ICD-10-CM | POA: Insufficient documentation

## 2022-07-23 DIAGNOSIS — C50212 Malignant neoplasm of upper-inner quadrant of left female breast: Secondary | ICD-10-CM | POA: Diagnosis present

## 2022-07-23 MED ORDER — OXYCODONE HCL 10 MG PO TABS
10.0000 mg | ORAL_TABLET | Freq: Four times a day (QID) | ORAL | 0 refills | Status: DC | PRN
  Filled 2022-07-28: qty 120, 30d supply, fill #0

## 2022-07-23 MED ORDER — OXYCODONE HCL 10 MG PO TABS
10.0000 mg | ORAL_TABLET | Freq: Four times a day (QID) | ORAL | 0 refills | Status: DC | PRN
  Filled 2022-07-23: qty 120, 30d supply, fill #0

## 2022-07-24 ENCOUNTER — Encounter: Payer: Self-pay | Admitting: Genetic Counselor

## 2022-07-24 ENCOUNTER — Telehealth: Payer: Self-pay | Admitting: Genetic Counselor

## 2022-07-24 DIAGNOSIS — Z1379 Encounter for other screening for genetic and chromosomal anomalies: Secondary | ICD-10-CM | POA: Insufficient documentation

## 2022-07-24 NOTE — Telephone Encounter (Signed)
Disclosed negative STAT genetics.  Pan-cancer panel is pending.    

## 2022-07-26 ENCOUNTER — Encounter: Payer: Self-pay | Admitting: Plastic Surgery

## 2022-07-26 ENCOUNTER — Ambulatory Visit (INDEPENDENT_AMBULATORY_CARE_PROVIDER_SITE_OTHER): Payer: PPO | Admitting: Plastic Surgery

## 2022-07-26 VITALS — BP 131/84 | HR 97 | Ht 67.0 in | Wt 234.0 lb

## 2022-07-26 DIAGNOSIS — E119 Type 2 diabetes mellitus without complications: Secondary | ICD-10-CM

## 2022-07-26 DIAGNOSIS — Z7984 Long term (current) use of oral hypoglycemic drugs: Secondary | ICD-10-CM

## 2022-07-26 DIAGNOSIS — C50412 Malignant neoplasm of upper-outer quadrant of left female breast: Secondary | ICD-10-CM | POA: Diagnosis not present

## 2022-07-26 DIAGNOSIS — Q796 Ehlers-Danlos syndrome, unspecified: Secondary | ICD-10-CM | POA: Diagnosis not present

## 2022-07-26 DIAGNOSIS — Q288 Other specified congenital malformations of circulatory system: Secondary | ICD-10-CM | POA: Diagnosis not present

## 2022-07-26 NOTE — Progress Notes (Signed)
Patient ID: April Meyer, female    DOB: Jul 18, 1965, 57 y.o.   MRN: [id number]   Chief Complaint  Patient presents with   Consult        Breast Problem   Breast Cancer    The patient is a 57 year old female here for breast consultation for reconstruction.  She was recently diagnosed with breast cancer of the left breast in the upper inner quadrant.  This was after routine mammogram.  The biopsy showed invasive ductal carcinoma.  It is estrogen and progesterone positive and HER2 negative with a Ki-67 of 5%.  She has Ehlers-Danlos syndrome she also has a shunt in her brain that runs through the inner aspect of her right breast.  She is not a smoker.  She has a strong family history of breast cancer.  The patient is 5 feet 7 inches tall and weighs 234 pounds.  Her past surgical history is positive for a brain shunt, cholecystectomy and tonsillectomy.  Her past medical history includes AV malformation, gastroesophageal reflux, glioma, and a chiari malformation thyroid disease, lumbar disc herniation, prediabetes and neuromuscular disorder.  Due to her family history she is planning on bilateral mastectomies.    Review of Systems  Constitutional: Negative.   Eyes: Negative.   Respiratory: Negative.  Negative for chest tightness and shortness of breath.   Cardiovascular: Negative.   Gastrointestinal: Negative.   Endocrine: Negative.   Genitourinary: Negative.   Musculoskeletal: Negative.     Past Medical History:  Diagnosis Date   ADHD (attention deficit hyperactivity disorder)    no meds for   Anxiety    Arnold-Chiari deformity (HCC)    Asthma    AVM (arteriovenous malformation) spine 04/21/2018   Breast cancer (HCC) 07/10/2022   Left   Bruises easily    Cerebral venous sinus thrombosis, remote, resolved 03/28/2013   Chronic pain    Common migraine with intractable migraine 04/21/2018   Complication of anesthesia age 61    breast lumpectomy heart rate went way down in hospital  stayed 2 days, no problems since   Complication of anesthesia    C 1 to C 3 fusion side to side and up and down limited    EDS (Ehlers-Danlos syndrome)    Eustachian tube dysfunction    Fatigue    GERD (gastroesophageal reflux disease)    Glioma (HCC)    low graded tectal plate glioma   Hemorrhoids    History of sleep apnea    no cpap used last 2 years, normal sleep study 2 yrs ago   HSV infection    Hypothyroid    Hypothyroidism    IIH (idiopathic intracranial hypertension)    Lumbar disc herniation    Migraine    Neck stiffness    Neuromuscular disorder (HCC)    Ehlers-Danlos Syndrome   Nystagmus, positional, central type 03/28/2013   Other malaise and fatigue 03/28/2013   " The patient reports feeling happy, ED, sore" on she has undergone at Chiari malformation surgery decompression in 2011. Prior to the surgery were chief complaints were weakness numbness and neck problems. She had swallowing difficulties and dizziness;  Classic Chiari presentation. But she didn't have headaches.   Photophobia    Pneumonia    Pre-diabetes    Tinnitus of both ears mainly in left ear    Urinary incontinence    Vision disturbance    Vitamin D deficiency    Weakness    Wears glasses  Past Surgical History:  Procedure Laterality Date   ARNOLD CHIARI REPAIR  2010   BLADDER SUSPENSION N/A 11/21/2021   Procedure: SINGLE INCISION SLING PROCEDURE (Altis Sling);  Surgeon: Marguerita Beards, MD;  Location: [facility];  Service: Gynecology;  Laterality: N/A;   Brain shunt  12/30/2019   vp shunt   BRAIN SURGERY     BREAST BIOPSY Left    lumpectomy age 62    BREAST BIOPSY Left 07/10/2022   [place] LT BREAST BX W LOC DEV 1ST LESION IMG BX SPEC US GUIDE 07/10/2022 [facility]   BREAST EXCISIONAL BIOPSY Right yrs ago   CHOLECYSTECTOMY  age 40   laparoscopic   colonscopy  2012, 2018   CYSTOSCOPY N/A 11/21/2021   Procedure: CYSTOSCOPY;  Surgeon: Marguerita Beards, MD;  Location: [facility];   Service: Gynecology;  Laterality: N/A;   DILATION AND CURETTAGE OF UTERUS  last done yrs ago   x 2 or 3   ENDOMETRIAL ABLATION  02/09/2013   [facility]   ESOPHAGOGASTRODUODENOSCOPY N/A 02/05/2021   Procedure: ESOPHAGOGASTRODUODENOSCOPY (EGD);  Surgeon: Jeani Hawking, MD;  Location: [facility];  Service: Endoscopy;  Laterality: N/A;   HYSTEROSCOPY WITH D & C N/A 06/26/2020   Procedure: DILATATION AND CURETTAGE /HYSTEROSCOPY;  Surgeon: Romualdo Bolk, MD;  Location: [facility];  Service: Gynecology;  Laterality: N/A;   INTRAUTERINE DEVICE INSERTION  10/2010   Mirena   IUD REMOVAL  11/2010   could not tolerate hormonal side effects   metal plate removed from head  2015   screw came loose   neck fusion c1-c3  2010   OPERATIVE ULTRASOUND N/A 06/26/2020   Procedure: OPERATIVE ULTRASOUND;  Surgeon: Romualdo Bolk, MD;  Location: [facility];  Service: Gynecology;  Laterality: N/A;   TONSILLECTOMY AND ADENOIDECTOMY  age 76   UPPER GI ENDOSCOPY  2012, 2018   XI ROBOTIC ASSISTED TOTAL HYSTERECTOMY WITH SACROCOLPOPEXY N/A 11/21/2021   Procedure: XI ROBOTIC ASSISTED TOTAL HYSTERECTOMY WITH BILATERAL SALPINGECTOMY  AND SACROCOLPOPEXY;  Surgeon: Marguerita Beards, MD;  Location: [facility];  Service: Gynecology;  Laterality: N/A;  TOTAL TIME REQUESTED IS 3.5HRS      Current Outpatient Medications:    acetaminophen (TYLENOL) 500 MG tablet, Take 1 tablet (500 mg total) by mouth every 6 (six) hours as needed (pain)., Disp: 30 tablet, Rfl: 0   albuterol (VENTOLIN HFA) 108 (90 Base) MCG/ACT inhaler, Inhale 1-2 puffs into the lungs every 4 (four) hours as needed for wheezing or shortness of breath., Disp: , Rfl:    busPIRone (BUSPAR) 7.5 MG tablet, Take 1 tablet (7.5 mg total) by mouth 2 (two) times daily. (Patient taking differently: Take 7.5 mg by mouth daily.), Disp: 60 tablet, Rfl: 5   cyclobenzaprine (FLEXERIL) 5 MG tablet, Take 5 mg by mouth 3 (three) times daily  as needed for muscle spasms., Disp: , Rfl:    cyclobenzaprine (FLEXERIL) 5 MG tablet, Take 1 tablet (5 mg total) by mouth 3 (three) times daily as needed., Disp: 90 tablet, Rfl: 2   cyclobenzaprine (FLEXERIL) 5 MG tablet, Take 1 tablet (5 mg total) by mouth 3 (three) times daily as needed., Disp: 90 tablet, Rfl: 2   dexlansoprazole (DEXILANT) 60 MG capsule, Take 60 mg by mouth at bedtime., Disp: , Rfl:    ibuprofen (ADVIL) 600 MG tablet, Take 1 tablet (600 mg total) by mouth every 6 (six) hours as needed., Disp: 30 tablet, Rfl: 0   lamoTRIgine (LAMICTAL) 25 MG tablet,  Take 1 tablet by mouth daily., Disp: , Rfl:    loratadine (CLARITIN) 10 MG tablet, Take 10 mg by mouth daily., Disp: , Rfl:    Menthol, Topical Analgesic, (BIOFREEZE EX), Apply 1 Application topically daily as needed (pain)., Disp: , Rfl:    metFORMIN (GLUCOPHAGE-XR) 500 MG 24 hr tablet, Take 500 mg by mouth daily., Disp: , Rfl:    ondansetron (ZOFRAN) 8 MG tablet, Take 8 mg by mouth every 8 (eight) hours as needed for nausea or vomiting., Disp: , Rfl:    Oxycodone HCl 10 MG TABS, Take 1 tablet by mouth four times a day as needed for pain, Disp: 120 tablet, Rfl: 0   Oxycodone HCl 10 MG TABS, Take 1 tablet by mouth four times a day as needed for pain, Disp: 120 tablet, Rfl: 0   Oxycodone HCl 10 MG TABS, Take 1 tablet (10 mg total) by mouth every 4 (four) hours as needed for pain., Disp: 180 tablet, Rfl: 0   Oxycodone HCl 10 MG TABS, Take 1 tablet (10 mg total) by mouth 5 (five) times daily as needed for pain dose frequency change., Disp: 150 tablet, Rfl: 0   Oxycodone HCl 10 MG TABS, Take 1 tablet (10 mg total) by mouth every 6 (six) hours as needed for pain dose frequency change., Disp: 120 tablet, Rfl: 0   Oxycodone HCl 10 MG TABS, Take 1 tablet (10 mg total) by mouth 4 (four) times daily as needed for pain., Disp: 120 tablet, Rfl: 0   Oxycodone HCl 10 MG TABS, Take 1 tablet (10 mg total) by mouth 4 (four) times daily as needed for  pain., Disp: 120 tablet, Rfl: 0   Oxycodone HCl 10 MG TABS, Take 1 tablet (10 mg total) by mouth 4 (four) times daily as needed for pain, Disp: 120 tablet, Rfl: 0   Oxycodone HCl 10 MG TABS, Take 1 tablet (10 mg total) by mouth 4 (four) times daily as needed. For pain, Disp: 120 tablet, Rfl: 0   polyethylene glycol powder (GLYCOLAX/MIRALAX) 17 GM/SCOOP powder, Take 17 g by mouth daily. Drink 17g (1 scoop) dissolved in water per day., Disp: 255 g, Rfl: 0   promethazine (PHENERGAN) 12.5 MG tablet, Take 1 tablet (12.5 mg total) by mouth 3 (three) times daily as needed., Disp: 20 tablet, Rfl: 1   promethazine (PHENERGAN) 12.5 MG tablet, Take 1 tablet (12.5 mg total) by mouth 3 (three) times daily as needed., Disp: 20 tablet, Rfl: 1   promethazine (PHENERGAN) 12.5 MG tablet, Take 1 tablet (12.5 mg total) by mouth 3 (three) times daily as needed., Disp: 20 tablet, Rfl: 1   promethazine (PHENERGAN) 25 MG tablet, Take 1 tablet (25 mg total) by mouth every 6 (six) hours as needed for nausea. Nausea, Disp: 30 tablet, Rfl: 1   rizatriptan (MAXALT-MLT) 10 MG disintegrating tablet, Take 10 mg by mouth as needed for migraine., Disp: , Rfl:    sertraline (ZOLOFT) 25 MG tablet, TAKE 2 TABLETS BY MOUTH BEFORE BREAKFAST, Disp: 90 tablet, Rfl: 2   SYNTHROID 137 MCG tablet, Take 137 mcg by mouth every morning., Disp: , Rfl:    UNABLE TO FIND, Take 1-2 tablets by mouth 2 (two) times daily as needed (sleep/pain). Cbd gummies, Disp: , Rfl:    valACYclovir (VALTREX) 1000 MG tablet, TAKE 2 TABS (2 GRAMS) BY MOUTH NOW AND REPEAT IN 12 HOURS X 1, AS NEEDED FOR COLDSORE, Disp: 30 tablet, Rfl: 1   Objective:   Vitals:   07/26/22  1054  BP: 131/84  Pulse: 97  SpO2: 97%    Physical Exam Vitals and nursing note reviewed.  Constitutional:      Appearance: Normal appearance.  HENT:     Head: Normocephalic and atraumatic.  Cardiovascular:     Rate and Rhythm: Normal rate.     Pulses: Normal pulses.  Pulmonary:      Effort: Pulmonary effort is normal.  Skin:    General: Skin is warm.     Capillary Refill: Capillary refill takes less than 2 seconds.     Coloration: Skin is not jaundiced.     Findings: No bruising.  Neurological:     Mental Status: She is alert and oriented to person, place, and time.  Psychiatric:        Mood and Affect: Mood normal.        Behavior: Behavior normal.        Thought Content: Thought content normal.        Judgment: Judgment normal.     Assessment & Plan:  AVM (arteriovenous malformation) spine  Type 2 diabetes mellitus without complication, without long-term current use of insulin (HCC)  Primary malignant neoplasm of upper outer quadrant of female breast, left (HCC)  Ehlers-Danlos syndrome, unspecified  The options for reconstruction we explained to the patient / family for breast reconstruction.  There are two general categories of reconstruction.  We can reconstruction a breast with implants or use the patient's own tissue.  These were further discussed as listed.  Breast reconstruction is an optional procedure and eligibility depends on the full spectrum of the health of the patient and any co-morbidities.  More than one surgery is often needed to complete the reconstruction process.  The process can take three to twelve months to complete.  The breasts will not be identical due to many factors such as rib differences, shoulder asymmetry and treatments such as radiation.  The goal is to get the breasts to look normal and symmetrical in clothes.  Scars are a part of surgery and may fade some in time but will always be present under clothes.  Surgery may be an option on the non-cancer breast to achieve more symmetry.  No matter which procedure is chosen there is always the risk of complications and even failure of the body to heal.  This could result in no breast.    The options for reconstruction include:  1. Placement of a tissue expander with Acellular dermal  matrix. When the expander is the desired size surgery is performed to remove the expander and place an implant.  In some cases the implant can be placed without an expander.  2. Autologous reconstruction can include using a muscle or tissue from another area of the body to create a breast.  3. Combined procedures (ie. latissismus dorsi flap) can be done with an expander / implant placed under the muscle.   The risks, benefits, scars and recovery time were discussed for each of the above. Risks include bleeding, infection, hematoma, seroma, scarring, pain, wound healing complications, flap loss, fat necrosis, capsular contracture, need for implant removal, donor site complications, bulge, hernia, umbilical necrosis, need for urgent reoperation, and need for dressing changes.   The procedure the patient selected / that was best for the patient, was then discussed in further detail.  Total time: 45 minutes. This includes time spent with the patient during the visit as well as time spent before and after the visit reviewing the chart, documenting the  encounter, making phone calls and reviewing studies.   The patient is very realistic and was a pleasure to talk to her.  She has been through a lot.  My major concern is with her Ehlers-Danlos and an implant.  If this does not heal quickly she is at an extremely high risk of complications, infection and wound breakdown.  She excepted this well and is going to go with a flap closure.  I spoke with Dr. Carolynne Edouard who feels comfortable doing this.  I remain available if needed.  The patient did want to speak to be in a week for her final decision.  Pictures were obtained of the patient and placed in the chart with the patient's or guardian's permission.   April Bills Callen Zuba, DO

## 2022-07-28 ENCOUNTER — Telehealth: Payer: Self-pay | Admitting: *Deleted

## 2022-07-28 ENCOUNTER — Other Ambulatory Visit (HOSPITAL_BASED_OUTPATIENT_CLINIC_OR_DEPARTMENT_OTHER): Payer: Self-pay

## 2022-07-28 NOTE — Progress Notes (Unsigned)
57 y.o. G51P1010 Divorced White or Caucasian Not Hispanic or Latino female here for annual exam.  She was recently diagnosed with left breast cancer, planning bilateral mastectomy. She wants to talk about the injection that they want to put her on to put her in menopause.     On 11/21/21 the patient underwent RTLH/BS/sacrocolpopexy, and sling with Dr Florian Buff.   She has developed a rectocele, mostly tolerable.  No bowel or bladder c/o.   Patient's last menstrual period was 08/10/2020.          Sexually active: No.  The current method of family planning is status post hysterectomy.    Exercising: No.  The patient does not participate in regular exercise at present. Smoker:  no  Health Maintenance: Pap:  03/27/21 WNL Hr hpv Neg, 05/08/16 WNL  Hr Hpv Neg, 12/16/11 WNL Hr HPV Neg  History of abnormal Pap:  no MMG:  06/23/22 see epic  BMD:   none Colonoscopy: 08-10-2016 normal f/u 10 years  TDaP:  unsure, thinks UTD  Gardasil: n/a   reports that she quit smoking about 14 years ago. Her smoking use included cigarettes. She has a 42.00 pack-year smoking history. She has never used smokeless tobacco. She reports current drug use. Drug: Marijuana. She reports that she does not drink alcohol. She helps people with medicare, insurance specialist. Lives with her partner. Her son died in 08/10/20, she is doing okay, has lots of support.   Past Medical History:  Diagnosis Date   ADHD (attention deficit hyperactivity disorder)    no meds for   Anxiety    Arnold-Chiari deformity (HCC)    Asthma    AVM (arteriovenous malformation) spine 04/21/2018   Breast cancer (HCC) 07/10/2022   Left   Bruises easily    Cerebral venous sinus thrombosis, remote, resolved 03/28/2013   Chronic pain    Common migraine with intractable migraine 04/21/2018   Complication of anesthesia age 27    breast lumpectomy heart rate went way down in hospital stayed 2 days, no problems since   Complication of anesthesia    C 1 to C 3  fusion side to side and up and down limited    EDS (Ehlers-Danlos syndrome)    Eustachian tube dysfunction    Fatigue    GERD (gastroesophageal reflux disease)    Glioma (HCC)    low graded tectal plate glioma   Hemorrhoids    History of sleep apnea    no cpap used last 2 years, normal sleep study 2 yrs ago   HSV infection    Hypothyroid    Hypothyroidism    IIH (idiopathic intracranial hypertension)    Lumbar disc herniation    Migraine    Neck stiffness    Neuromuscular disorder (HCC)    Ehlers-Danlos Syndrome   Nystagmus, positional, central type 03/28/2013   Other malaise and fatigue 03/28/2013   " The patient reports feeling happy, ED, sore" on she has undergone at Chiari malformation surgery decompression in August 10, 2009. Prior to the surgery were chief complaints were weakness numbness and neck problems. She had swallowing difficulties and dizziness;  Classic Chiari presentation. But she didn't have headaches.   Photophobia    Pneumonia    Pre-diabetes    Tinnitus of both ears mainly in left ear    Urinary incontinence    Vision disturbance    Vitamin D deficiency    Weakness    Wears glasses     Past Surgical History:  Procedure Laterality  Date   ARNOLD CHIARI REPAIR  2010   BLADDER SUSPENSION N/A 11/21/2021   Procedure: SINGLE INCISION SLING PROCEDURE (Altis Sling);  Surgeon: Marguerita Beards, MD;  Location: [facility];  Service: Gynecology;  Laterality: N/A;   Brain shunt  12/30/2019   vp shunt   BRAIN SURGERY     BREAST BIOPSY Left    lumpectomy age 58    BREAST BIOPSY Left 07/10/2022   [place] LT BREAST BX W LOC DEV 1ST LESION IMG BX SPEC US GUIDE 07/10/2022 [facility]   BREAST EXCISIONAL BIOPSY Right yrs ago   CHOLECYSTECTOMY  age 66   laparoscopic   colonscopy  2012, 2018   CYSTOSCOPY N/A 11/21/2021   Procedure: CYSTOSCOPY;  Surgeon: Marguerita Beards, MD;  Location: [facility];  Service: Gynecology;  Laterality: N/A;   DILATION AND CURETTAGE OF UTERUS  last  done yrs ago   x 2 or 3   ENDOMETRIAL ABLATION  02/09/2013   [facility]   ESOPHAGOGASTRODUODENOSCOPY N/A 02/05/2021   Procedure: ESOPHAGOGASTRODUODENOSCOPY (EGD);  Surgeon: Jeani Hawking, MD;  Location: [facility];  Service: Endoscopy;  Laterality: N/A;   HYSTEROSCOPY WITH D & C N/A 06/26/2020   Procedure: DILATATION AND CURETTAGE /HYSTEROSCOPY;  Surgeon: Romualdo Bolk, MD;  Location: [facility];  Service: Gynecology;  Laterality: N/A;   INTRAUTERINE DEVICE INSERTION  10/2010   Mirena   IUD REMOVAL  11/2010   could not tolerate hormonal side effects   metal plate removed from head  2015   screw came loose   neck fusion c1-c3  2010   OPERATIVE ULTRASOUND N/A 06/26/2020   Procedure: OPERATIVE ULTRASOUND;  Surgeon: Romualdo Bolk, MD;  Location: [facility];  Service: Gynecology;  Laterality: N/A;   TONSILLECTOMY AND ADENOIDECTOMY  age 29   UPPER GI ENDOSCOPY  2012, 2018   XI ROBOTIC ASSISTED TOTAL HYSTERECTOMY WITH SACROCOLPOPEXY N/A 11/21/2021   Procedure: XI ROBOTIC ASSISTED TOTAL HYSTERECTOMY WITH BILATERAL SALPINGECTOMY  AND SACROCOLPOPEXY;  Surgeon: Marguerita Beards, MD;  [facility];  Service: Gynecology;  Laterality: N/A;  TOTAL TIME REQUESTED IS 3.5HRS    Current Outpatient Medications  Medication Sig Dispense Refill   acetaminophen (TYLENOL) 500 MG tablet Take 1 tablet (500 mg total) by mouth every 6 (six) hours as needed (pain). 30 tablet 0   albuterol (VENTOLIN HFA) 108 (90 Base) MCG/ACT inhaler Inhale 1-2 puffs into the lungs every 4 (four) hours as needed for wheezing or shortness of breath.     busPIRone (BUSPAR) 7.5 MG tablet Take 1 tablet (7.5 mg total) by mouth 2 (two) times daily. (Patient taking differently: Take 7.5 mg by mouth daily.) 60 tablet 5   cyclobenzaprine (FLEXERIL) 5 MG tablet Take 1 tablet (5 mg total) by mouth 3 (three) times daily as needed. 90 tablet 2   dexlansoprazole (DEXILANT) 60 MG capsule Take 60 mg  by mouth at bedtime.     ibuprofen (ADVIL) 600 MG tablet Take 1 tablet (600 mg total) by mouth every 6 (six) hours as needed. 30 tablet 0   lamoTRIgine (LAMICTAL) 25 MG tablet Take 1 tablet by mouth daily.     loratadine (CLARITIN) 10 MG tablet Take 10 mg by mouth daily.     Menthol, Topical Analgesic, (BIOFREEZE EX) Apply 1 Application topically daily as needed (pain).     metFORMIN (GLUCOPHAGE-XR) 500 MG 24 hr tablet Take 500 mg by mouth daily.     ondansetron (ZOFRAN) 8 MG tablet Take 8 mg  by mouth every 8 (eight) hours as needed for nausea or vomiting.     Oxycodone HCl 10 MG TABS Take 1 tablet (10 mg total) by mouth 4 (four) times daily as needed. For pain 120 tablet 0   polyethylene glycol powder (GLYCOLAX/MIRALAX) 17 GM/SCOOP powder Take 17 g by mouth daily. Drink 17g (1 scoop) dissolved in water per day. 255 g 0   promethazine (PHENERGAN) 12.5 MG tablet Take 1 tablet (12.5 mg total) by mouth 3 (three) times daily as needed. 20 tablet 1   rizatriptan (MAXALT-MLT) 10 MG disintegrating tablet Take 10 mg by mouth as needed for migraine.     sertraline (ZOLOFT) 25 MG tablet TAKE 2 TABLETS BY MOUTH BEFORE BREAKFAST 90 tablet 2   SYNTHROID 137 MCG tablet Take 137 mcg by mouth every morning.     UNABLE TO FIND Take 1-2 tablets by mouth 2 (two) times daily as needed (sleep/pain). Cbd gummies     valACYclovir (VALTREX) 1000 MG tablet TAKE 2 TABS (2 GRAMS) BY MOUTH NOW AND REPEAT IN 12 HOURS X 1, AS NEEDED FOR COLDSORE 30 tablet 1   No current facility-administered medications for this visit.    Family History  Problem Relation Age of Onset   Aortic dissection Father    Cancer Father        GI; dx after 45   Melanoma Father        dx after 53   Breast cancer Sister 72       bilateral   Diabetes Maternal Aunt    Breast cancer Maternal Aunt        x3 mat aunts; dx after 87; one w/ multiple primaries   Uterine cancer Maternal Aunt        dx 47s   Diabetes Maternal Uncle    Breast cancer  Paternal Aunt 71   Lung cancer Paternal Aunt    Pancreatic cancer Paternal Uncle 37   Diabetes Maternal Grandmother    Cancer Maternal Grandmother        skin and lung; dx after 50   Diabetes Paternal Grandmother    Breast cancer Other        MGF's mother; dx after 63   Rectal cancer Cousin        d. 91s   Brain cancer Cousin     Review of Systems  All other systems reviewed and are negative.   Exam:   BP 122/72   Pulse 79   Ht 5\' 7"  (1.702 m)   Wt 234 lb (106.1 kg)   LMP 08/10/2020   SpO2 100%   BMI 36.65 kg/m   Weight change: @WEIGHTCHANGE @ Height:   Height: 5\' 7"  (170.2 cm)  Ht Readings from Last 3 Encounters:  07/31/22 5\' 7"  (1.702 m)  07/29/22 5\' 7"  (1.702 m)  07/26/22 5\' 7"  (1.702 m)    General appearance: alert, cooperative and appears stated age Head: Normocephalic, without obvious abnormality, atraumatic Neck: no adenopathy, supple, symmetrical, trachea midline and thyroid normal to inspection and palpation Lungs: clear to auscultation bilaterally Cardiovascular: regular rate and rhythm Breasts: normal appearance, no masses or tenderness Abdomen: soft, non-tender; non distended,  no masses,  no organomegaly Extremities: extremities normal, atraumatic, no cyanosis or edema Skin: Skin color, texture, turgor normal. No rashes or lesions Lymph nodes: Cervical, supraclavicular, and axillary nodes normal. No abnormal inguinal nodes palpated Neurologic: Grossly normal   Pelvic: External genitalia:  no lesions  Urethra:  normal appearing urethra with no masses, tenderness or lesions              Bartholins and Skenes: normal                 Vagina: normal appearing vagina with a small grade 2 rectocele              Cervix: absent               Bimanual Exam:  Uterus:  uterus absent              Adnexa: no mass, fullness, tenderness               Rectovaginal: Confirms               Anus:  normal sphincter tone, no lesions   1. Well woman  exam Labs with primary No pap needed Colonoscopy UTD  2. Rectocele Tolerable  3. Primary malignant neoplasm of upper outer quadrant of female breast, left [facility]) Being scheduled for bilateral mastectomies.  4. Vasomotor symptoms due to menopause Not a candidate for ERT. On a SSRI. Discussed option for gabapentin. Will start at 100 mg po qhs, she is aware the dose can be increased. - gabapentin (NEURONTIN) 100 MG capsule; Take 1 capsule (100 mg total) by mouth at bedtime.  Dispense: 90 capsule; Refill: 3

## 2022-07-28 NOTE — Telephone Encounter (Signed)
Faxed order,demographics,insurance information,and recent office notes to [facility].  Confirmation received and copy scanned into the chart.//AB/CMA

## 2022-07-29 ENCOUNTER — Inpatient Hospital Stay (HOSPITAL_BASED_OUTPATIENT_CLINIC_OR_DEPARTMENT_OTHER): Payer: PPO | Admitting: Hematology and Oncology

## 2022-07-29 ENCOUNTER — Inpatient Hospital Stay: Payer: PPO

## 2022-07-29 ENCOUNTER — Other Ambulatory Visit: Payer: Self-pay

## 2022-07-29 VITALS — BP 122/83 | HR 87 | Temp 97.5°F | Resp 18 | Ht 67.0 in | Wt 235.6 lb

## 2022-07-29 DIAGNOSIS — Z86718 Personal history of other venous thrombosis and embolism: Secondary | ICD-10-CM | POA: Diagnosis not present

## 2022-07-29 DIAGNOSIS — Z803 Family history of malignant neoplasm of breast: Secondary | ICD-10-CM

## 2022-07-29 DIAGNOSIS — C50412 Malignant neoplasm of upper-outer quadrant of left female breast: Secondary | ICD-10-CM

## 2022-07-29 DIAGNOSIS — Z170 Estrogen receptor positive status [ER+]: Secondary | ICD-10-CM

## 2022-07-29 DIAGNOSIS — Z808 Family history of malignant neoplasm of other organs or systems: Secondary | ICD-10-CM

## 2022-07-29 DIAGNOSIS — Z801 Family history of malignant neoplasm of trachea, bronchus and lung: Secondary | ICD-10-CM | POA: Insufficient documentation

## 2022-07-29 DIAGNOSIS — Z87891 Personal history of nicotine dependence: Secondary | ICD-10-CM | POA: Insufficient documentation

## 2022-07-29 DIAGNOSIS — Z8049 Family history of malignant neoplasm of other genital organs: Secondary | ICD-10-CM | POA: Insufficient documentation

## 2022-07-29 DIAGNOSIS — Z800 Family history of malignant neoplasm of digestive organs: Secondary | ICD-10-CM

## 2022-07-29 NOTE — Assessment & Plan Note (Signed)
This is a very pleasant 57 year old postmenopausal female patient with newly diagnosed left breast invasive ductal carcinoma, grade 1, ER/PR positive, HER2 1+, Ki-67 of 5% referred to medical oncology for additional recommendations.  Given strong family history of breast cancer, patient is electing to proceed with bilateral mastectomies, no reconstruction given history of Ehlers-Danlos syndrome. Given small tumor, strong ER/PR positivity and low proliferation index, she will proceed with surgery followed by Oncotype testing if the final pathology shows tumor greater than 5 mm.  At this time we have discussed about antiestrogen therapy after surgery.  Since she is not quite postmenopausal, her last menstrual cycle before her hysterectomy was 5 months prior we have discussed about options for antiestrogen therapy.  She had history of cerebral venous sinus thrombus hence I would recommend considering OFS with AI for antiestrogen therapy.  We have discussed about mechanism of action of both tamoxifen and aromatase inhibitors and adverse effects with each class.  She is also willing to consider aromatase inhibitors with ovarian suppression if indeed she remains premenopausal. Will obtain baseline bone density after her surgery.  All her questions were answered to the best my knowledge.  Thank you for consulting [place] in the care of this patient.  Please do not hesitate to contact us with any additional questions or concerns.

## 2022-07-29 NOTE — Progress Notes (Signed)
[facility] CONSULT NOTE  Patient Care Team: Dois Davenport, MD as PCP - General (Family Medicine) Rainey Pines, MD as Referring Physician (Neurosurgery)  CHIEF COMPLAINTS/PURPOSE OF CONSULTATION:  Newly diagnosed breast cancer  HISTORY OF PRESENTING ILLNESS:  April Meyer 57 y.o. female is here because of recent diagnosis of left breast cancer.  I reviewed her records extensively and collaborated the history with the patient.  SUMMARY OF ONCOLOGIC HISTORY: Oncology History  Primary malignant neoplasm of upper outer quadrant of female breast, left (HCC)  07/16/2022 Initial Diagnosis   Primary malignant neoplasm of upper outer quadrant of female breast, left (HCC)   07/16/2022 Cancer Staging   Staging form: Breast, AJCC 8th Edition - Clinical stage from 07/16/2022: Stage IA (cT1b, cN0, cM0, G1, ER+, PR+, HER2-) - Signed by Ronny Bacon, PA-C on 07/16/2022 Stage prefix: Initial diagnosis Method of lymph node assessment: Clinical Histologic grading system: 3 grade system   07/23/2022 Genetic Testing   Negative [facility] Breast Cancer STAT Panel.  Report date 07/23/2022.   The STAT Breast cancer panel offered by [facility] includes sequencing and rearrangement analysis for the following 9 genes:  ATM, BRCA1, BRCA2, CDH1, CHEK2, PALB2, PTEN, STK11 and TP53.  Pan-cancer panel is pending.     This is a very pleasant 57 year old postmenopausal female patient with past medical history significant for cerebral venous sinus thrombus, Arnold-Chiari deformity, ADHD, Ehlers-Danlos syndrome with screen detected left breast grade 1 IDC, ER/PR positive HER2 1+, low proliferation index referred to medical oncology.  She is contemplating bilateral mastectomy but since she has Erler's Danlos syndrome, she is not a good candidate for reconstruction.  [name] that she does not want to do lumpectomy because she has a very strong family history of breast cancer despite negative  genetic testing.  She also had history of cerebral venous sinus thrombus, no DVT/PE.  Her last menstrual cycle before hysterectomy was about 5 months prior hence she may not be quite in menopause.  She had right breast biopsy many years ago which was benign.  Rest of the pertinent 10 point ROS reviewed and negative  MEDICAL HISTORY:  Past Medical History:  Diagnosis Date   ADHD (attention deficit hyperactivity disorder)    no meds for   Anxiety    Arnold-Chiari deformity (HCC)    Asthma    AVM (arteriovenous malformation) spine 04/21/2018   Breast cancer (HCC) 07/10/2022   Left   Bruises easily    Cerebral venous sinus thrombosis, remote, resolved 03/28/2013   Chronic pain    Common migraine with intractable migraine 04/21/2018   Complication of anesthesia age 32    breast lumpectomy heart rate went way down in hospital stayed 2 days, no problems since   Complication of anesthesia    C 1 to C 3 fusion side to side and up and down limited    EDS (Ehlers-Danlos syndrome)    Eustachian tube dysfunction    Fatigue    GERD (gastroesophageal reflux disease)    Glioma (HCC)    low graded tectal plate glioma   Hemorrhoids    History of sleep apnea    no cpap used last 2 years, normal sleep study 2 yrs ago   HSV infection    Hypothyroid    Hypothyroidism    IIH (idiopathic intracranial hypertension)    Lumbar disc herniation    Migraine    Neck stiffness    Neuromuscular disorder (HCC)    Ehlers-Danlos Syndrome  Nystagmus, positional, central type 03/28/2013   Other malaise and fatigue 03/28/2013   " The patient reports feeling happy, ED, sore" on she has undergone at Chiari malformation surgery decompression in 2011. Prior to the surgery were chief complaints were weakness numbness and neck problems. She had swallowing difficulties and dizziness;  Classic Chiari presentation. But she didn't have headaches.   Photophobia    Pneumonia    Pre-diabetes    Tinnitus of both ears  mainly in left ear    Urinary incontinence    Vision disturbance    Vitamin D deficiency    Weakness    Wears glasses     SURGICAL HISTORY: Past Surgical History:  Procedure Laterality Date   ARNOLD CHIARI REPAIR  2010   BLADDER SUSPENSION N/A 11/21/2021   Procedure: SINGLE INCISION SLING PROCEDURE (Altis Sling);  Surgeon: Marguerita Beards, MD;  Location: [facility];  Service: Gynecology;  Laterality: N/A;   Brain shunt  12/30/2019   vp shunt   BRAIN SURGERY     BREAST BIOPSY Left    lumpectomy age 50    BREAST BIOPSY Left 07/10/2022   [place] LT BREAST BX W LOC DEV 1ST LESION IMG BX SPEC US GUIDE 07/10/2022 [facility]   BREAST EXCISIONAL BIOPSY Right yrs ago   CHOLECYSTECTOMY  age 83   laparoscopic   colonscopy  2012, 2018   CYSTOSCOPY N/A 11/21/2021   Procedure: CYSTOSCOPY;  Surgeon: Marguerita Beards, MD;  Location: [facility];  Service: Gynecology;  Laterality: N/A;   DILATION AND CURETTAGE OF UTERUS  last done yrs ago   x 2 or 3   ENDOMETRIAL ABLATION  02/09/2013   [facility]   ESOPHAGOGASTRODUODENOSCOPY N/A 02/05/2021   Procedure: ESOPHAGOGASTRODUODENOSCOPY (EGD);  Surgeon: Jeani Hawking, MD;  Location: [facility];  Service: Endoscopy;  Laterality: N/A;   HYSTEROSCOPY WITH D & C N/A 06/26/2020   Procedure: DILATATION AND CURETTAGE /HYSTEROSCOPY;  Surgeon: Romualdo Bolk, MD;  Location: [facility] [place];  Service: Gynecology;  Laterality: N/A;   INTRAUTERINE DEVICE INSERTION  10/2010   Mirena   IUD REMOVAL  11/2010   could not tolerate hormonal side effects   metal plate removed from head  2015   screw came loose   neck fusion c1-c3  2010   OPERATIVE ULTRASOUND N/A 06/26/2020   Procedure: OPERATIVE ULTRASOUND;  Surgeon: Romualdo Bolk, MD;  Location: [facility];  Service: Gynecology;  Laterality: N/A;   TONSILLECTOMY AND ADENOIDECTOMY  age 86   UPPER GI ENDOSCOPY  2012, 2018   XI ROBOTIC ASSISTED TOTAL HYSTERECTOMY WITH  SACROCOLPOPEXY N/A 11/21/2021   Procedure: XI ROBOTIC ASSISTED TOTAL HYSTERECTOMY WITH BILATERAL SALPINGECTOMY  AND SACROCOLPOPEXY;  Surgeon: Marguerita Beards, MD;  Location: [facility];  Service: Gynecology;  Laterality: N/A;  TOTAL TIME REQUESTED IS 3.5HRS    SOCIAL HISTORY: Social History   Socioeconomic History   Marital status: Divorced    Spouse name: Not on file   Number of children: 1   Years of education: 16   Highest education level: Not on file  Occupational History    Employer: OTHER  Tobacco Use   Smoking status: Former    Packs/day: 2.00    Years: 21.00    Additional pack years: 0.00    Total pack years: 42.00    Types: Cigarettes    Quit date: 09/09/2007    Years since quitting: 14.8   Smokeless tobacco: Never  Vaping Use   Vaping Use: Never  used  Substance and Sexual Activity   Alcohol use: No    Comment: h/o binge drinking none in 10 years   Drug use: Yes    Types: Marijuana    Comment: daily  use of marijuana recently   Sexual activity: Not Currently    Partners: Male    Birth control/protection: Other-see comments    Comment: Vasectomy-1st intercourse 57 yo-More than 5 partners  Other Topics Concern   Not on file  Social History Narrative   Patient is divorced and lives at home with her one child.   Patient is disabled.   Patient is right-handed.   Patient has a college education.   Patient drinks one cup of coffee daily.   Social Determinants of Health   Financial Resource Strain: Not on file  Food Insecurity: No Food Insecurity (07/18/2022)   Hunger Vital Sign    Worried About Running Out of Food in the Last Year: Never true    Ran Out of Food in the Last Year: Never true  Transportation Needs: No Transportation Needs (07/18/2022)   PRAPARE - Administrator, Civil Service (Medical): No    Lack of Transportation (Non-Medical): No  Physical Activity: Not on file  Stress: Not on file  Social Connections: Not on file  Intimate  Partner Violence: Not At Risk (07/18/2022)   Humiliation, Afraid, Rape, and Kick questionnaire    Fear of Current or Ex-Partner: No    Emotionally Abused: No    Physically Abused: No    Sexually Abused: No    FAMILY HISTORY: Family History  Problem Relation Age of Onset   Aortic dissection Father    Cancer Father        GI; dx after 47   Melanoma Father        dx after 47   Breast cancer Sister 63       bilateral   Diabetes Maternal Aunt    Breast cancer Maternal Aunt        x3 mat aunts; dx after 52; one w/ multiple primaries   Uterine cancer Maternal Aunt        dx 4s   Diabetes Maternal Uncle    Breast cancer Paternal Aunt 40   Lung cancer Paternal Aunt    Pancreatic cancer Paternal Uncle 57   Diabetes Maternal Grandmother    Cancer Maternal Grandmother        skin and lung; dx after 50   Diabetes Paternal Grandmother    Breast cancer Other        MGF's mother; dx after 30   Rectal cancer Cousin        d. 49s   Brain cancer Cousin     ALLERGIES:  is allergic to advair hfa [fluticasone-salmeterol], other, prednisone, and morphine and codeine.  MEDICATIONS:  Current Outpatient Medications  Medication Sig Dispense Refill   acetaminophen (TYLENOL) 500 MG tablet Take 1 tablet (500 mg total) by mouth every 6 (six) hours as needed (pain). 30 tablet 0   albuterol (VENTOLIN HFA) 108 (90 Base) MCG/ACT inhaler Inhale 1-2 puffs into the lungs every 4 (four) hours as needed for wheezing or shortness of breath.     busPIRone (BUSPAR) 7.5 MG tablet Take 1 tablet (7.5 mg total) by mouth 2 (two) times daily. (Patient taking differently: Take 7.5 mg by mouth daily.) 60 tablet 5   cyclobenzaprine (FLEXERIL) 5 MG tablet Take 1 tablet (5 mg total) by mouth 3 (three) times daily as needed.  90 tablet 2   dexlansoprazole (DEXILANT) 60 MG capsule Take 60 mg by mouth at bedtime.     ibuprofen (ADVIL) 600 MG tablet Take 1 tablet (600 mg total) by mouth every 6 (six) hours as needed. 30 tablet  0   lamoTRIgine (LAMICTAL) 25 MG tablet Take 1 tablet by mouth daily.     loratadine (CLARITIN) 10 MG tablet Take 10 mg by mouth daily.     Menthol, Topical Analgesic, (BIOFREEZE EX) Apply 1 Application topically daily as needed (pain).     metFORMIN (GLUCOPHAGE-XR) 500 MG 24 hr tablet Take 500 mg by mouth daily.     ondansetron (ZOFRAN) 8 MG tablet Take 8 mg by mouth every 8 (eight) hours as needed for nausea or vomiting.     Oxycodone HCl 10 MG TABS Take 1 tablet (10 mg total) by mouth 4 (four) times daily as needed. For pain 120 tablet 0   polyethylene glycol powder (GLYCOLAX/MIRALAX) 17 GM/SCOOP powder Take 17 g by mouth daily. Drink 17g (1 scoop) dissolved in water per day. 255 g 0   promethazine (PHENERGAN) 12.5 MG tablet Take 1 tablet (12.5 mg total) by mouth 3 (three) times daily as needed. 20 tablet 1   rizatriptan (MAXALT-MLT) 10 MG disintegrating tablet Take 10 mg by mouth as needed for migraine.     sertraline (ZOLOFT) 25 MG tablet TAKE 2 TABLETS BY MOUTH BEFORE BREAKFAST 90 tablet 2   SYNTHROID 137 MCG tablet Take 137 mcg by mouth every morning.     UNABLE TO FIND Take 1-2 tablets by mouth 2 (two) times daily as needed (sleep/pain). Cbd gummies     valACYclovir (VALTREX) 1000 MG tablet TAKE 2 TABS (2 GRAMS) BY MOUTH NOW AND REPEAT IN 12 HOURS X 1, AS NEEDED FOR COLDS[place] 30 tablet 1   No current facility-administered medications for this visit.     PHYSICAL EXAMINATION: ECOG PERFORMANCE STATUS: 0 - Asymptomatic  Vitals:   07/29/22 1032  BP: 122/83  Pulse: 87  Resp: 18  Temp: (!) 97.5 F (36.4 C)  SpO2: 95%   Filed Weights   07/29/22 1032  Weight: 235 lb 9 oz (106.9 kg)    GENERAL:alert, no distress and comfortable Breast: Bilateral breasts inspected and palpated.  No palpable masses or regional adenopathy No lower extremity edema  LABORATORY DATA:  I have reviewed the data as listed Lab Results  Component Value Date   WBC 7.8 11/15/2021   HGB 12.1 11/15/2021    HCT 37.1 11/15/2021   MCV 89.4 11/15/2021   PLT 205 11/15/2021   Lab Results  Component Value Date   NA 137 11/15/2021   K 3.7 11/15/2021   CL 109 11/15/2021   CO2 22 11/15/2021    RADIOGRAPHIC STUDIES: I have personally reviewed the radiological reports and agreed with the findings in the report.  ASSESSMENT AND PLAN:   Primary malignant neoplasm of upper outer quadrant of female breast, left (HCC) This is a very pleasant 57 year old postmenopausal female patient with newly diagnosed left breast invasive ductal carcinoma, grade 1, ER/PR positive, HER2 1+, Ki-67 of 5% referred to medical oncology for additional recommendations.  Given strong family history of breast cancer, patient is electing to proceed with bilateral mastectomies, no reconstruction given history of Ehlers-Danlos syndrome. Given small tumor, strong ER/PR positivity and low proliferation index, she will proceed with surgery followed by Oncotype testing if the final pathology shows tumor greater than 5 mm.  At this time we have discussed about antiestrogen  therapy after surgery.  Since she is not quite postmenopausal, her last menstrual cycle before her hysterectomy was 5 months prior we have discussed about options for antiestrogen therapy.  She had history of cerebral venous sinus thrombus hence I would recommend considering OFS with AI for antiestrogen therapy.  We have discussed about mechanism of action of both tamoxifen and aromatase inhibitors and adverse effects with each class.  She is also willing to consider aromatase inhibitors with ovarian suppression if indeed she remains premenopausal. Will obtain baseline bone density after her surgery.  All her questions were answered to the best my knowledge.  Thank you for consulting [place] in the care of this patient.  Please do not hesitate to contact us with any additional questions or concerns.  Total time spent: 45 min All questions were answered. The patient knows to  call the clinic with any problems, questions or concerns.    Rachel Moulds, MD 07/29/22

## 2022-07-31 ENCOUNTER — Telehealth: Payer: Self-pay | Admitting: *Deleted

## 2022-07-31 ENCOUNTER — Ambulatory Visit (INDEPENDENT_AMBULATORY_CARE_PROVIDER_SITE_OTHER): Payer: PPO | Admitting: Obstetrics and Gynecology

## 2022-07-31 ENCOUNTER — Encounter: Payer: Self-pay | Admitting: Obstetrics and Gynecology

## 2022-07-31 ENCOUNTER — Encounter: Payer: Self-pay | Admitting: *Deleted

## 2022-07-31 VITALS — BP 122/72 | HR 79 | Ht 67.0 in | Wt 234.0 lb

## 2022-07-31 DIAGNOSIS — Z01419 Encounter for gynecological examination (general) (routine) without abnormal findings: Secondary | ICD-10-CM | POA: Diagnosis not present

## 2022-07-31 DIAGNOSIS — N951 Menopausal and female climacteric states: Secondary | ICD-10-CM | POA: Diagnosis not present

## 2022-07-31 DIAGNOSIS — C50412 Malignant neoplasm of upper-outer quadrant of left female breast: Secondary | ICD-10-CM | POA: Diagnosis not present

## 2022-07-31 DIAGNOSIS — N816 Rectocele: Secondary | ICD-10-CM

## 2022-07-31 MED ORDER — GABAPENTIN 100 MG PO CAPS
100.0000 mg | ORAL_CAPSULE | Freq: Every day | ORAL | 3 refills | Status: AC
Start: 2022-07-31 — End: ?

## 2022-07-31 NOTE — Patient Instructions (Signed)

## 2022-07-31 NOTE — Telephone Encounter (Signed)
Spoke to pt regarding navigation resources and questions or concerns regarding dx or treatment care plan. Denies needs or treatment plan questions. Pt did request sx date. Informed pt that Dr. Billey Chang office will call with her sx date and instructions. Msg sent to Dr. Carolynne Edouard sx scheduler requesting update on sx date. Encourage pt to call with questions or needs. Received verbal understanding.

## 2022-08-01 ENCOUNTER — Ambulatory Visit: Payer: Self-pay | Admitting: Genetic Counselor

## 2022-08-01 ENCOUNTER — Telehealth: Payer: Self-pay | Admitting: Genetic Counselor

## 2022-08-01 DIAGNOSIS — Z800 Family history of malignant neoplasm of digestive organs: Secondary | ICD-10-CM

## 2022-08-01 DIAGNOSIS — Z1589 Genetic susceptibility to other disease: Secondary | ICD-10-CM

## 2022-08-01 DIAGNOSIS — Z803 Family history of malignant neoplasm of breast: Secondary | ICD-10-CM

## 2022-08-01 DIAGNOSIS — C50412 Malignant neoplasm of upper-outer quadrant of left female breast: Secondary | ICD-10-CM

## 2022-08-01 DIAGNOSIS — Z1379 Encounter for other screening for genetic and chromosomal anomalies: Secondary | ICD-10-CM

## 2022-08-01 NOTE — Telephone Encounter (Signed)
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  LZTR1 is associated with autosomal dominant LZTR1-related schwannomatosis and autosomal dominant and autosomal recessive Noonan syndrome.  No management changes related to schwannomatosis recommended in absence of PH or FH schwannomas.    Discussed updated medical genetics eval given clinical dx of hypermobile EDS, aortic dissection in father (most informative relative of testing for aortic dissection is father), and LZTR1 pos result.    Discussed patient candidate for ECHO given father's aortic aneurysm.    Will touch base again in July after breast cancer surgery to set up further genetics eval.

## 2022-08-02 ENCOUNTER — Other Ambulatory Visit (HOSPITAL_COMMUNITY): Payer: Self-pay | Admitting: Psychiatry

## 2022-08-05 ENCOUNTER — Other Ambulatory Visit (HOSPITAL_COMMUNITY): Payer: Self-pay | Admitting: Psychiatry

## 2022-08-05 ENCOUNTER — Other Ambulatory Visit: Payer: Self-pay

## 2022-08-05 ENCOUNTER — Encounter: Payer: Self-pay | Admitting: *Deleted

## 2022-08-07 ENCOUNTER — Ambulatory Visit: Payer: Self-pay | Admitting: General Surgery

## 2022-08-07 DIAGNOSIS — Z170 Estrogen receptor positive status [ER+]: Secondary | ICD-10-CM

## 2022-08-08 ENCOUNTER — Telehealth: Payer: Self-pay | Admitting: Hematology and Oncology

## 2022-08-08 ENCOUNTER — Ambulatory Visit (INDEPENDENT_AMBULATORY_CARE_PROVIDER_SITE_OTHER): Payer: PPO | Admitting: Plastic Surgery

## 2022-08-08 DIAGNOSIS — C50412 Malignant neoplasm of upper-outer quadrant of left female breast: Secondary | ICD-10-CM

## 2022-08-08 NOTE — Telephone Encounter (Signed)
Spoke with patient confirming upcoming appointment change  

## 2022-08-08 NOTE — Progress Notes (Signed)
   Subjective:    Patient ID: April Meyer, female    DOB: 09/15/1965, 57 y.o.   MRN: [id number]  I connected with the patient on the phone.  She is not going to do reconstruction but just the flap closure.  She understands Dr. Carolynne Edouard is going to do that for her.  With her Ehlers-Danlos she is at an extremely increased risk of skin breakdown and issues.  She knows she can come and see [place] at any time.      Review of Systems  Constitutional: Negative.   Eyes: Negative.   Respiratory: Negative.    Cardiovascular: Negative.   Gastrointestinal: Negative.   Endocrine: Negative.   Genitourinary: Negative.        Objective:   Physical Exam     Assessment & Plan:     ICD-10-CM   1. Primary malignant neoplasm of upper outer quadrant of female breast, left [facility])  C50.412       Patient know she can come and see [place] afterwards and we are certainly willing to do her nipple areola tattooing when she heals up.  I connected with  April Meyer on 08/08/22 by phone and verified that I am speaking with the correct person using two identifiers. We spent 5 minutes in discussion.  I was at the office and the patient was in [place].I discussed the limitations of evaluation and management by telemedicine. The patient expressed understanding and agreed to proceed.

## 2022-08-13 MED ORDER — SERTRALINE HCL 25 MG PO TABS: 25.0000 mg | ORAL_TABLET | Freq: Every day | ORAL | 2 refills | Status: DC

## 2022-08-14 ENCOUNTER — Other Ambulatory Visit: Payer: Self-pay

## 2022-08-14 ENCOUNTER — Encounter (HOSPITAL_BASED_OUTPATIENT_CLINIC_OR_DEPARTMENT_OTHER): Payer: Self-pay | Admitting: General Surgery

## 2022-08-14 NOTE — Progress Notes (Signed)
Chart reviewed with Dr. Hyacinth Meeker due to patient history of Ehler-Danlos syndrome and chiari malformation with VP shunt in place. Ok to proceed with surgery as planned at [facility],

## 2022-08-25 ENCOUNTER — Ambulatory Visit (HOSPITAL_COMMUNITY)
Admission: RE | Admit: 2022-08-25 | Discharge: 2022-08-25 | Disposition: A | Discharge status: Home / Self Care | Payer: PPO | Source: Ambulatory Visit | Attending: General Surgery | Admitting: General Surgery

## 2022-08-25 ENCOUNTER — Ambulatory Visit (HOSPITAL_BASED_OUTPATIENT_CLINIC_OR_DEPARTMENT_OTHER): Payer: PPO | Admitting: Anesthesiology

## 2022-08-25 ENCOUNTER — Encounter (HOSPITAL_BASED_OUTPATIENT_CLINIC_OR_DEPARTMENT_OTHER)
Admission: RE | Disposition: A | Discharge status: Home / Self Care | Payer: Self-pay | Source: Home / Self Care | Attending: General Surgery

## 2022-08-25 ENCOUNTER — Ambulatory Visit (HOSPITAL_BASED_OUTPATIENT_CLINIC_OR_DEPARTMENT_OTHER)
Admission: RE | Admit: 2022-08-25 | Discharge: 2022-08-26 | Disposition: A | Discharge status: Home / Self Care | Payer: PPO | Attending: General Surgery | Admitting: General Surgery

## 2022-08-25 ENCOUNTER — Other Ambulatory Visit: Payer: Self-pay

## 2022-08-25 ENCOUNTER — Encounter (HOSPITAL_BASED_OUTPATIENT_CLINIC_OR_DEPARTMENT_OTHER): Payer: Self-pay | Admitting: General Surgery

## 2022-08-25 DIAGNOSIS — J449 Chronic obstructive pulmonary disease, unspecified: Secondary | ICD-10-CM

## 2022-08-25 DIAGNOSIS — C50912 Malignant neoplasm of unspecified site of left female breast: Secondary | ICD-10-CM | POA: Diagnosis present

## 2022-08-25 DIAGNOSIS — D0511 Intraductal carcinoma in situ of right breast: Secondary | ICD-10-CM | POA: Diagnosis not present

## 2022-08-25 DIAGNOSIS — N6081 Other benign mammary dysplasias of right breast: Secondary | ICD-10-CM | POA: Diagnosis not present

## 2022-08-25 DIAGNOSIS — Z170 Estrogen receptor positive status [ER+]: Secondary | ICD-10-CM | POA: Diagnosis not present

## 2022-08-25 DIAGNOSIS — G4730 Sleep apnea, unspecified: Secondary | ICD-10-CM

## 2022-08-25 DIAGNOSIS — Z01818 Encounter for other preprocedural examination: Secondary | ICD-10-CM

## 2022-08-25 HISTORY — PX: MASTECTOMY W/ SENTINEL NODE BIOPSY: SHX2001

## 2022-08-25 HISTORY — PX: SIMPLE MASTECTOMY WITH AXILLARY SENTINEL NODE BIOPSY: SHX6098

## 2022-08-25 LAB — GLUCOSE, CAPILLARY
Glucose-Capillary: 225 mg/dL — ABNORMAL HIGH (ref 70–99)
Glucose-Capillary: 185 mg/dL — ABNORMAL HIGH (ref 70–99)

## 2022-08-25 SURGERY — MASTECTOMY WITH SENTINEL LYMPH NODE BIOPSY
Anesthesia: General | Site: Breast | Laterality: Right

## 2022-08-25 MED ORDER — LACTATED RINGERS IV SOLN: INTRAVENOUS | Status: DC

## 2022-08-25 MED ORDER — PROPOFOL 10 MG/ML IV BOLUS
INTRAVENOUS | Status: AC
  Filled 2022-08-25: qty 20

## 2022-08-25 MED ORDER — CYCLOBENZAPRINE HCL 10 MG PO TABS
5.0000 mg | ORAL_TABLET | Freq: Three times a day (TID) | ORAL | Status: DC | PRN
  Administered 2022-08-25 (×2): 5 mg via ORAL
  Filled 2022-08-25 (×3): qty 1

## 2022-08-25 MED ORDER — OXYCODONE HCL 5 MG PO TABS
5.0000 mg | ORAL_TABLET | ORAL | Status: DC | PRN
  Administered 2022-08-25: 5 mg via ORAL
  Administered 2022-08-25 – 2022-08-26 (×3): 10 mg via ORAL
  Filled 2022-08-25: qty 2
  Filled 2022-08-25: qty 1
  Filled 2022-08-25 (×2): qty 2

## 2022-08-25 MED ORDER — MIDAZOLAM HCL 2 MG/2ML IJ SOLN
2.0000 mg | Freq: Once | INTRAMUSCULAR | Status: AC
  Administered 2022-08-25: 2 mg via INTRAVENOUS

## 2022-08-25 MED ORDER — FENTANYL CITRATE (PF) 100 MCG/2ML IJ SOLN
INTRAMUSCULAR | Status: AC
  Filled 2022-08-25: qty 2

## 2022-08-25 MED ORDER — DEXAMETHASONE SODIUM PHOSPHATE 10 MG/ML IJ SOLN
INTRAMUSCULAR | Status: AC
  Filled 2022-08-25: qty 1

## 2022-08-25 MED ORDER — ONDANSETRON HCL 4 MG PO TABS: 8.0000 mg | ORAL_TABLET | Freq: Three times a day (TID) | ORAL | Status: DC | PRN

## 2022-08-25 MED ORDER — RIZATRIPTAN BENZOATE 10 MG PO TBDP: 10.0000 mg | ORAL_TABLET | ORAL | Status: DC | PRN

## 2022-08-25 MED ORDER — LORATADINE 10 MG PO TABS
10.0000 mg | ORAL_TABLET | Freq: Every day | ORAL | Status: DC
  Filled 2022-08-25: qty 1

## 2022-08-25 MED ORDER — CHLORHEXIDINE GLUCONATE CLOTH 2 % EX PADS: 6.0000 | MEDICATED_PAD | Freq: Once | CUTANEOUS | Status: DC

## 2022-08-25 MED ORDER — ONDANSETRON HCL 4 MG/2ML IJ SOLN
INTRAMUSCULAR | Status: AC
  Filled 2022-08-25: qty 2

## 2022-08-25 MED ORDER — INSULIN ASPART 100 UNIT/ML IJ SOLN
0.0000 [IU] | Freq: Three times a day (TID) | INTRAMUSCULAR | Status: DC
  Administered 2022-08-25: 3 [IU] via SUBCUTANEOUS
  Filled 2022-08-25: qty 1

## 2022-08-25 MED ORDER — SODIUM CHLORIDE 0.9 % IV SOLN: INTRAVENOUS | Status: DC

## 2022-08-25 MED ORDER — ALBUTEROL SULFATE HFA 108 (90 BASE) MCG/ACT IN AERS: 1.0000 | INHALATION_SPRAY | RESPIRATORY_TRACT | Status: DC | PRN

## 2022-08-25 MED ORDER — METFORMIN HCL ER 500 MG PO TB24
500.0000 mg | ORAL_TABLET | Freq: Every day | ORAL | Status: DC
  Filled 2022-08-25: qty 1

## 2022-08-25 MED ORDER — FENTANYL CITRATE (PF) 100 MCG/2ML IJ SOLN
50.0000 ug | Freq: Once | INTRAMUSCULAR | Status: AC
  Administered 2022-08-25: 50 ug via INTRAVENOUS

## 2022-08-25 MED ORDER — BUPIVACAINE-EPINEPHRINE (PF) 0.25% -1:200000 IJ SOLN
INTRAMUSCULAR | Status: DC | PRN
  Administered 2022-08-25 (×2): 30 mL via PERINEURAL

## 2022-08-25 MED ORDER — FENTANYL CITRATE (PF) 100 MCG/2ML IJ SOLN
25.0000 ug | INTRAMUSCULAR | Status: DC | PRN
  Administered 2022-08-25 (×2): 50 ug via INTRAVENOUS

## 2022-08-25 MED ORDER — GABAPENTIN 100 MG PO CAPS
100.0000 mg | ORAL_CAPSULE | Freq: Every day | ORAL | Status: DC
  Administered 2022-08-25: 100 mg via ORAL

## 2022-08-25 MED ORDER — IBUPROFEN 600 MG PO TABS
600.0000 mg | ORAL_TABLET | Freq: Four times a day (QID) | ORAL | Status: DC | PRN
  Administered 2022-08-25 – 2022-08-26 (×2): 600 mg via ORAL
  Filled 2022-08-25 (×2): qty 1

## 2022-08-25 MED ORDER — CEFAZOLIN SODIUM-DEXTROSE 2-4 GM/100ML-% IV SOLN
2.0000 g | INTRAVENOUS | Status: AC
  Administered 2022-08-25: 2 g via INTRAVENOUS

## 2022-08-25 MED ORDER — SERTRALINE HCL 25 MG PO TABS
25.0000 mg | ORAL_TABLET | Freq: Every day | ORAL | Status: DC
  Filled 2022-08-25: qty 1

## 2022-08-25 MED ORDER — EPHEDRINE 5 MG/ML INJ
INTRAVENOUS | Status: AC
  Filled 2022-08-25: qty 5

## 2022-08-25 MED ORDER — ENOXAPARIN SODIUM 30 MG/0.3ML IJ SOSY
30.0000 mg | PREFILLED_SYRINGE | INTRAMUSCULAR | Status: DC
  Filled 2022-08-25: qty 0.3

## 2022-08-25 MED ORDER — PROMETHAZINE HCL 25 MG PO TABS: 12.5000 mg | ORAL_TABLET | Freq: Three times a day (TID) | ORAL | Status: DC | PRN

## 2022-08-25 MED ORDER — EPHEDRINE SULFATE (PRESSORS) 50 MG/ML IJ SOLN
INTRAMUSCULAR | Status: DC | PRN
  Administered 2022-08-25 (×2): 5 mg via INTRAVENOUS

## 2022-08-25 MED ORDER — PROMETHAZINE HCL 25 MG/ML IJ SOLN: 6.2500 mg | INTRAMUSCULAR | Status: DC | PRN

## 2022-08-25 MED ORDER — PROPOFOL 500 MG/50ML IV EMUL
INTRAVENOUS | Status: DC | PRN
  Administered 2022-08-25: 25 ug/kg/min via INTRAVENOUS

## 2022-08-25 MED ORDER — ONDANSETRON HCL 4 MG/2ML IJ SOLN
INTRAMUSCULAR | Status: DC | PRN
  Administered 2022-08-25: 4 mg via INTRAVENOUS

## 2022-08-25 MED ORDER — CEFAZOLIN SODIUM-DEXTROSE 2-4 GM/100ML-% IV SOLN
INTRAVENOUS | Status: AC
  Filled 2022-08-25: qty 100

## 2022-08-25 MED ORDER — DEXAMETHASONE SODIUM PHOSPHATE 10 MG/ML IJ SOLN
INTRAMUSCULAR | Status: DC | PRN
  Administered 2022-08-25 (×2): 5 mg

## 2022-08-25 MED ORDER — TECHNETIUM TC 99M TILMANOCEPT KIT
1.0000 | PACK | Freq: Once | INTRAVENOUS | Status: AC | PRN
  Administered 2022-08-25: 1 via INTRADERMAL

## 2022-08-25 MED ORDER — BUSPIRONE HCL 5 MG PO TABS
7.5000 mg | ORAL_TABLET | Freq: Every day | ORAL | Status: DC
  Filled 2022-08-25: qty 1.5

## 2022-08-25 MED ORDER — ACETAMINOPHEN 500 MG PO TABS
ORAL_TABLET | ORAL | Status: AC
  Filled 2022-08-25: qty 2

## 2022-08-25 MED ORDER — LEVOTHYROXINE SODIUM 137 MCG PO TABS
137.0000 ug | ORAL_TABLET | Freq: Every morning | ORAL | Status: DC
  Administered 2022-08-26: 137 ug via ORAL
  Filled 2022-08-25: qty 1

## 2022-08-25 MED ORDER — LIDOCAINE 2% (20 MG/ML) 5 ML SYRINGE
INTRAMUSCULAR | Status: AC
  Filled 2022-08-25: qty 5

## 2022-08-25 MED ORDER — ACETAMINOPHEN 500 MG PO TABS
1000.0000 mg | ORAL_TABLET | Freq: Once | ORAL | Status: AC
  Administered 2022-08-25: 1000 mg via ORAL

## 2022-08-25 MED ORDER — GABAPENTIN 300 MG PO CAPS
ORAL_CAPSULE | ORAL | Status: AC
  Filled 2022-08-25: qty 1

## 2022-08-25 MED ORDER — LAMOTRIGINE 25 MG PO TABS
25.0000 mg | ORAL_TABLET | Freq: Every day | ORAL | Status: DC
  Filled 2022-08-25: qty 1

## 2022-08-25 MED ORDER — ACETAMINOPHEN 500 MG PO TABS
500.0000 mg | ORAL_TABLET | Freq: Four times a day (QID) | ORAL | Status: DC | PRN
  Administered 2022-08-25 – 2022-08-26 (×3): 500 mg via ORAL
  Filled 2022-08-25 (×3): qty 1

## 2022-08-25 MED ORDER — GABAPENTIN 300 MG PO CAPS
300.0000 mg | ORAL_CAPSULE | Freq: Once | ORAL | Status: AC
  Administered 2022-08-25: 300 mg via ORAL

## 2022-08-25 MED ORDER — FENTANYL CITRATE (PF) 100 MCG/2ML IJ SOLN
INTRAMUSCULAR | Status: DC | PRN
  Administered 2022-08-25 (×2): 25 ug via INTRAVENOUS
  Administered 2022-08-25: 50 ug via INTRAVENOUS

## 2022-08-25 MED ORDER — LIDOCAINE HCL (CARDIAC) PF 100 MG/5ML IV SOSY
PREFILLED_SYRINGE | INTRAVENOUS | Status: DC | PRN
  Administered 2022-08-25: 80 mg via INTRATRACHEAL

## 2022-08-25 MED ORDER — FENTANYL CITRATE (PF) 100 MCG/2ML IJ SOLN: 12.5000 ug | INTRAMUSCULAR | Status: DC | PRN

## 2022-08-25 MED ORDER — GABAPENTIN 100 MG PO CAPS
ORAL_CAPSULE | ORAL | Status: AC
  Filled 2022-08-25: qty 1

## 2022-08-25 MED ORDER — ACETAMINOPHEN 500 MG PO TABS
ORAL_TABLET | ORAL | Status: AC
  Filled 2022-08-25: qty 1

## 2022-08-25 MED ORDER — MIDAZOLAM HCL 2 MG/2ML IJ SOLN
INTRAMUSCULAR | Status: AC
  Filled 2022-08-25: qty 2

## 2022-08-25 MED ORDER — PANTOPRAZOLE SODIUM 40 MG PO TBEC
40.0000 mg | DELAYED_RELEASE_TABLET | Freq: Every day | ORAL | Status: DC
  Administered 2022-08-25: 40 mg via ORAL
  Filled 2022-08-25: qty 1

## 2022-08-25 MED ORDER — OXYCODONE HCL 5 MG PO TABS: 5.0000 mg | ORAL_TABLET | Freq: Four times a day (QID) | ORAL | 0 refills | Status: DC | PRN

## 2022-08-25 MED ORDER — 0.9 % SODIUM CHLORIDE (POUR BTL) OPTIME
TOPICAL | Status: DC | PRN
  Administered 2022-08-25: 1000 mL

## 2022-08-25 MED ORDER — PROPOFOL 10 MG/ML IV BOLUS
INTRAVENOUS | Status: DC | PRN
  Administered 2022-08-25: 200 mg via INTRAVENOUS

## 2022-08-25 SURGICAL SUPPLY — 64 items
ADH SKN CLS APL DERMABOND .7 (GAUZE/BANDAGES/DRESSINGS) ×6
APL PRP STRL LF DISP 70% ISPRP (MISCELLANEOUS) ×4
APPLIER CLIP 9.375 MED OPEN (MISCELLANEOUS) ×2
APR CLP MED 9.3 20 MLT OPN (MISCELLANEOUS) ×2
BINDER BREAST 3XL (GAUZE/BANDAGES/DRESSINGS) ×2 IMPLANT
BINDER BREAST LRG (GAUZE/BANDAGES/DRESSINGS) IMPLANT
BINDER BREAST MEDIUM (GAUZE/BANDAGES/DRESSINGS) IMPLANT
BINDER BREAST XLRG (GAUZE/BANDAGES/DRESSINGS) IMPLANT
BINDER BREAST XXLRG (GAUZE/BANDAGES/DRESSINGS) IMPLANT
BIOPATCH RED 1 DISK 7.0 (GAUZE/BANDAGES/DRESSINGS) ×2 IMPLANT
BLADE CLIPPER SURG (BLADE) IMPLANT
BLADE SURG 10 STRL SS (BLADE) ×2 IMPLANT
BLADE SURG 15 STRL LF DISP TIS (BLADE) ×2 IMPLANT
BLADE SURG 15 STRL SS (BLADE) ×2
CANISTER SUCT 1200ML W/VALVE (MISCELLANEOUS) ×2 IMPLANT
CHLORAPREP W/TINT 26 (MISCELLANEOUS) ×2 IMPLANT
CLIP APPLIE 9.375 MED OPEN (MISCELLANEOUS) ×2 IMPLANT
COVER BACK TABLE 60X90IN (DRAPES) ×2 IMPLANT
COVER MAYO STAND STRL (DRAPES) ×2 IMPLANT
COVER PROBE CYLINDRICAL 5X96 (MISCELLANEOUS) ×2 IMPLANT
DERMABOND ADVANCED .7 DNX12 (GAUZE/BANDAGES/DRESSINGS) ×2 IMPLANT
DEVICE DSSCT PLSMBLD 3.0S LGHT (MISCELLANEOUS) ×2 IMPLANT
DRAIN CHANNEL 19F RND (DRAIN) ×2 IMPLANT
DRAPE LAPAROSCOPIC ABDOMINAL (DRAPES) ×2 IMPLANT
DRAPE SURG 17X23 STRL (DRAPES) IMPLANT
DRAPE UTILITY XL STRL (DRAPES) ×2 IMPLANT
DRSG TEGADERM 2-3/8X2-3/4 SM (GAUZE/BANDAGES/DRESSINGS) ×2 IMPLANT
ELECT COATED BLADE 2.86 ST (ELECTRODE) IMPLANT
ELECT REM PT RETURN 9FT ADLT (ELECTROSURGICAL) ×2
ELECTRODE REM PT RTRN 9FT ADLT (ELECTROSURGICAL) ×2 IMPLANT
EVACUATOR SILICONE 100CC (DRAIN) ×2 IMPLANT
GAUZE PAD ABD 8X10 STRL (GAUZE/BANDAGES/DRESSINGS) ×2 IMPLANT
GAUZE SPONGE 4X4 12PLY STRL LF (GAUZE/BANDAGES/DRESSINGS) ×2 IMPLANT
GLOVE BIO SURGEON STRL SZ 6 (GLOVE) IMPLANT
GLOVE BIO SURGEON STRL SZ 6.5 (GLOVE) IMPLANT
GLOVE BIO SURGEON STRL SZ7.5 (GLOVE) ×2 IMPLANT
GLOVE BIOGEL PI IND STRL 7.0 (GLOVE) IMPLANT
GLOVE BIOGEL PI IND STRL 7.5 (GLOVE) IMPLANT
GLOVE SURG SYN 7.5 E (GLOVE) ×4 IMPLANT
GLOVE SURG SYN 7.5 PF PI (GLOVE) IMPLANT
GOWN STRL REUS W/ TWL LRG LVL3 (GOWN DISPOSABLE) ×4 IMPLANT
GOWN STRL REUS W/ TWL XL LVL3 (GOWN DISPOSABLE) IMPLANT
GOWN STRL REUS W/TWL LRG LVL3 (GOWN DISPOSABLE) ×4
GOWN STRL REUS W/TWL XL LVL3 (GOWN DISPOSABLE) ×4
NDL HYPO 25X1 1.5 SAFETY (NEEDLE) ×2 IMPLANT
NEEDLE HYPO 25X1 1.5 SAFETY (NEEDLE) IMPLANT
NS IRRIG 1000ML POUR BTL (IV SOLUTION) ×2 IMPLANT
PACK BASIN DAY SURGERY FS (CUSTOM PROCEDURE TRAY) ×2 IMPLANT
PENCIL SMOKE EVACUATOR (MISCELLANEOUS) IMPLANT
PIN SAFETY STERILE (MISCELLANEOUS) ×2 IMPLANT
PLASMABLADE 3.0S W/LIGHT (MISCELLANEOUS) ×2
SLEEVE SCD COMPRESS KNEE MED (STOCKING) ×2 IMPLANT
SPIKE FLUID TRANSFER (MISCELLANEOUS) IMPLANT
SPONGE T-LAP 18X18 ~~LOC~~+RFID (SPONGE) ×2 IMPLANT
SUT ETHILON 2 0 FS 18 (SUTURE) ×2 IMPLANT
SUT MNCRL AB 4-0 PS2 18 (SUTURE) ×2 IMPLANT
SUT MON AB 4-0 PC3 18 (SUTURE) ×2 IMPLANT
SUT SILK 2 0 SH (SUTURE) IMPLANT
SUT VICRYL 3-0 CR8 SH (SUTURE) ×2 IMPLANT
SYR CONTROL 10ML LL (SYRINGE) ×2 IMPLANT
TOWEL GREEN STERILE FF (TOWEL DISPOSABLE) ×2 IMPLANT
TRAY FOLEY W/BAG SLVR 14FR LF (SET/KITS/TRAYS/PACK) IMPLANT
TUBE CONNECTING 20X1/4 (TUBING) ×2 IMPLANT
YANKAUER SUCT BULB TIP NO VENT (SUCTIONS) ×2 IMPLANT

## 2022-08-25 NOTE — Progress Notes (Signed)
Assisted Dr. Turk with left, right, pectoralis, ultrasound guided block. Side rails up, monitors on throughout procedure. See vital signs in flow sheet. Tolerated Procedure well. 

## 2022-08-25 NOTE — Anesthesia Procedure Notes (Signed)
Anesthesia Regional Block: Pectoralis block   Pre-Anesthetic Checklist: , timeout performed,  Correct Patient, Correct Site, Correct Laterality,  Correct Procedure, Correct Position, site marked,  Risks and benefits discussed,  Surgical consent,  Pre-op evaluation,  At surgeon's request and post-op pain management  Laterality: Right  Prep: chloraprep       Needles:  Injection technique: Single-shot  Needle Type: Echogenic Needle     Needle Length: 9cm  Needle Gauge: 21     Additional Needles:   Procedures:,,,, ultrasound used (permanent image in chart),,    Narrative:  Start time: 08/25/2022 9:52 AM End time: 08/25/2022 9:58 AM Injection made incrementally with aspirations every 5 mL.  Performed by: Personally  Anesthesiologist: Collene Schlichter, MD  Additional Notes: No pain on injection. No increased resistance to injection. Injection made in 5cc increments.  Good needle visualization.  Patient tolerated procedure well.

## 2022-08-25 NOTE — Op Note (Addendum)
08/25/2022  1:05 PM  PATIENT:  April Meyer  57 y.o. female  PRE-OPERATIVE DIAGNOSIS:  LEFT BREAST CANCER  POST-OPERATIVE DIAGNOSIS:  LEFT BREAST CANCER  PROCEDURE:  Procedure(s): LEFT MASTECTOMY WITH DEEP LEFT AXILLARY SENTINEL LYMPH NODE BIOPSY (Left) RIGHT SIMPLE MASTECTOMY (Right)  SURGEON:  Surgeon(s) and Role:    * Griselda Miner, MD - Primary  PHYSICIAN ASSISTANT:   ASSISTANTS: Dr. Margarito Courser   ANESTHESIA:   general  EBL:  50 mL   BLOOD ADMINISTERED:none  DRAINS: (2) Blake drain(s) in the prepectoral space    LOCAL MEDICATIONS USED:  NONE  SPECIMEN:  Source of Specimen:  right mastectomy, left mastectomy and sentinel nodes x 3  DISPOSITION OF SPECIMEN:  PATHOLOGY  COUNTS:  YES  TOURNIQUET:  * No tourniquets in log *  DICTATION: .Dragon Dictation  After informed consent was obtained the patient was brought to the operating room and placed in the supine position on the operating table.  After adequate induction of general anesthesia the patient's bilateral chest, breast, and axillary areas were prepped with ChloraPrep, allowed to dry, and draped in usual sterile manner.  An appropriate timeout was performed.  Earlier in the day the patient underwent injection of 1 mCi of technetium sulfur colloid in the subareolar position on the left side.  Attention was first turned to the right breast.  An elliptical incision was made around the nipple and areola complex in order to minimize the excess skin with a 10 blade knife.  The incision was carried through the skin and subcutaneous tissue sharply with the PlasmaBlade.  Breast hooks were used to elevate the skin flaps anteriorly towards the ceiling.  Skin flaps were then created by dissecting between the breast tissue in the subcutaneous fat and skin.  This dissection was carried circumferentially all the way to the muscle of the chest wall.  Next the breast was removed from the pectoralis muscle with the pectoralis fashion.   Once the breast was separated from the chest wall then the entire right breast was removed from the patient.  The breast was marked with a stitch on the medial skin and sent to pathology for further evaluation.  Hemostasis was achieved using the PlasmaBlade.  The wound was irrigated with copious amounts of saline. Next a small incision was made near the anterior axillary line inferior to the operative bed.  A hemostat was placed through this opening and used to bring a 19 [place] round Blake drain into the operative bed.  The drain was curled along the chest wall.  The drain was anchored to the skin with a 3-0 nylon stitch.  Next the superior and inferior skin flaps were grossly reapproximated with interrupted 3-0 Vicryl stitches.  The skin was then closed with running 4-0 Monocryl subcuticular stitches.  The drain was placed to bulb suction and there was a good seal.  The skin flaps were healthy.  Attention was then turned to the right breast.  A similar elliptical incision was made around the nipple and areola complex in order to minimize the excess skin.  The incision was then carried through the skin and subcutaneous tissue sharply with the PlasmaBlade.  Breast hooks were used to elevate the skin flaps anteriorly towards the ceiling.  Skin flaps were then created by dissecting between the breast tissue in the subcutaneous fat and skin.  This dissection was carried all the way to the chest wall circumferentially.  Next the breast was removed from the pectoralis muscle  with the pectoralis fascia.  Once this dissection was complete then the entire left breast was removed from the patient.  It was oriented with a stitch on the lateral skin and sent to pathology for further evaluation.  The neoprobe was set to technetium and I was able to identify 2 lymph nodes with signal and 1 that was palpable.  All 3 of these nodes were excised sharply with the PlasmaBlade and the surrounding small vessels and lymphatics were  controlled with clips.  Ex vivo counts on these 2 nodes were between 2000 and 4000.  No other hot or palpable nodes were identified in the left axilla.  These were sent as sentinel nodes numbers 1 through 3.  Hemostasis was achieved using the PlasmaBlade.  The wound was irrigated with copious amounts of saline.  A small stab incision was made near the anterior axillary line inferior to the operative bed.  A hemostat was placed through this opening and used to bring a 19 [place] round Blake drain into the operative bed.  The drain was curled along the chest wall.  The drain was anchored to the skin with a 3-0 nylon stitch.  Next the superior and inferior skin flaps were grossly reapproximated with interrupted 3-0 Vicryl stitches.  The skin was then closed with running 4-0 Monocryl subcuticular stitches.  Dermabond dressings were applied.  Sterile drain dressings were also applied.  The patient tolerated the procedure well.  The drain was placed to bulb suction and there was a good seal.  The skin flaps all appear to be healthy.  At the end of the case all needle sponge and instrument counts were correct.  The patient was then awakened and taken to recovery in stable condition. I was personally present during the entire procedure.  PLAN OF CARE: Admit for overnight observation  PATIENT DISPOSITION:  PACU - hemodynamically stable.   Delay start of Pharmacological VTE agent (>24hrs) due to surgical blood loss or risk of bleeding: no

## 2022-08-25 NOTE — Interval H&P Note (Signed)
History and Physical Interval Note:  08/25/2022 9:56 AM  April Meyer  has presented today for surgery, with the diagnosis of LEFT BREAST CANCER.  The various methods of treatment have been discussed with the patient and family. After consideration of risks, benefits and other options for treatment, the patient has consented to  Procedure(s): LEFT MASTECTOMY WITH SENTINEL LYMPH NODE BIOPSY (Left) RIGHT SIMPLE MASTECTOMY (Right) as a surgical intervention.  The patient's history has been reviewed, patient examined, no change in status, stable for surgery.  I have reviewed the patient's chart and labs.  Questions were answered to the patient's satisfaction.     [name]

## 2022-08-25 NOTE — Transfer of Care (Signed)
Immediate Anesthesia Transfer of Care Note  Patient: April Meyer  Procedure(s) Performed: LEFT MASTECTOMY WITH SENTINEL LYMPH NODE BIOPSY (Left: Breast) RIGHT SIMPLE MASTECTOMY (Right: Breast)  Patient Location: PACU  Anesthesia Type:GA combined with regional for post-op pain  Level of Consciousness: drowsy and patient cooperative  Airway & Oxygen Therapy: Patient Spontanous Breathing and Patient connected to face mask oxygen  Post-op Assessment: Report given to RN and Post -op Vital signs reviewed and stable  Post vital signs: Reviewed and stable  Last Vitals:  Vitals Value Taken Time  BP    Temp    Pulse 66 08/25/22 1311  Resp 13 08/25/22 1311  SpO2 98 % 08/25/22 1311  Vitals shown include unvalidated device data.  Last Pain:  Vitals:   08/25/22 0918  TempSrc: Oral  PainSc: 6       Patients Stated Pain Goal: 3 (08/25/22 1610)  Complications: No notable events documented.

## 2022-08-25 NOTE — Anesthesia Preprocedure Evaluation (Addendum)
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   LEFT BREAST CANCER  Reproductive/Obstetrics                             Anesthesia Physical Anesthesia Plan  ASA: 3  Anesthesia Plan: General   Post-op Pain Management: Tylenol PO (pre-op)*, Regional block* and Gabapentin PO (pre-op)*   Induction: Intravenous  PONV Risk Score and Plan: 3 and Midazolam, Dexamethasone and Ondansetron  Airway Management Planned: LMA  Additional Equipment:   Intra-op Plan:   Post-operative Plan: Extubation in OR  Informed Consent: I have reviewed the patients History and Physical, chart, labs and discussed the procedure including the risks, benefits and alternatives for the proposed anesthesia with the patient or authorized representative who has indicated his/her understanding and acceptance.     Dental advisory given  Plan Discussed with: CRNA  Anesthesia Plan Comments:         Anesthesia Quick Evaluation

## 2022-08-25 NOTE — Anesthesia Procedure Notes (Signed)
Procedure Name: LMA Insertion Date/Time: 08/25/2022 10:33 AM  Performed by: Thornell Mule, CRNAPre-anesthesia Checklist: Patient identified, Emergency Drugs available, Suction available and Patient being monitored Patient Re-evaluated:Patient Re-evaluated prior to induction Oxygen Delivery Method: Circle system utilized Preoxygenation: Pre-oxygenation with 100% oxygen Induction Type: IV induction LMA: LMA inserted LMA Size: 4.0 Number of attempts: 1 Placement Confirmation: positive ETCO2 Tube secured with: Tape Dental Injury: Teeth and Oropharynx as per pre-operative assessment

## 2022-08-25 NOTE — Progress Notes (Signed)
Timeout for nuclear med w/David performed.

## 2022-08-25 NOTE — H&P (Signed)
REFERRING PHYSICIAN: Dois Davenport, MD PROVIDER: Lindell Noe, MD MRN: [id number] DOB: [birth year] Subjective   Chief Complaint: Breast Cancer  History of Present Illness: April Meyer is a 57 y.o. female who is seen today as an office consultation for evaluation of Breast Cancer  We are asked to see the patient in consultation by Dr. Nadyne Coombes to evaluate her for a new left breast cancer. The patient is a 57 year old white female who recently went for a routine screening mammogram. At that time she was found to have a 6 mm mass in the upper inner quadrant of the left breast. The lymph nodes looked normal. The mass was biopsied and came back as a grade 1 invasive ductal cancer that was ER and PR positive and HER2 negative with a Ki-67 of 5%. She does have Ehlers-Danlos syndrome and has a shunt that runs through the inner aspect of her right breast. She does not smoke. She does have a strong family history of breast cancer in a sister and 4 aunts  Review of Systems: A complete review of systems was obtained from the patient. I have reviewed this information and discussed as appropriate with the patient. See HPI as well for other ROS.  ROS   Medical History: Past Medical History:  Diagnosis Date  Anxiety  Asthma, unspecified asthma severity, unspecified whether complicated, unspecified whether persistent (HHS-HCC)  GERD (gastroesophageal reflux disease)  History of cancer  Thyroid disease   Patient Active Problem List  Diagnosis  Malignant neoplasm of upper-inner quadrant of left breast in female, estrogen receptor positive (CMS/HHS-HCC)   Past Surgical History:  Procedure Laterality Date  CHOLECYSTECTOMY  shunt placmeent  tonsilectomy    Allergies  Allergen Reactions  Prednisone Unknown   Current Outpatient Medications on File Prior to Visit  Medication Sig Dispense Refill  busPIRone (BUSPAR) 7.5 MG tablet TAKE 1 BY MOUTH 2 TIMES EVERY DAY  DEXILANT 60 mg DR capsule   levothyroxine (SYNTHROID, LEVOTHROID) 112 MCG tablet Take 137 mcg by mouth once daily  metFORMIN (GLUCOPHAGE-XR) 500 MG XR tablet Take 500 mg by mouth 2 (two) times daily  oxyCODONE-acetaminophen (PERCOCET) 10-325 mg tablet 1 tab by mouth every 6 hours as needed  rizatriptan (MAXALT-MLT) 10 MG disintegrating tablet as directed  sertraline (ZOLOFT) 25 MG tablet TAKE 2 TABLETS BY MOUTH BEFORE BREAKFAST  esomeprazole (NEXIUM) 40 MG DR capsule 1 cap by mouth daily  gabapentin (NEURONTIN) 800 MG tablet 1 tab by mouth 4 times a day   No current facility-administered medications on file prior to visit.   Family History  Problem Relation Age of Onset  Skin cancer Father  Obesity Sister  Breast cancer Sister    Social History   Tobacco Use  Smoking Status Former  Types: Cigarettes  Smokeless Tobacco Never    Social History   Socioeconomic History  Marital status: Single  Tobacco Use  Smoking status: Former  Types: Cigarettes  Smokeless tobacco: Never   Objective:   Vitals:  BP: 124/82  Pulse: 82  Weight: (!) 108.7 kg (239 lb 9.6 oz)  Height: 170.2 cm (5\' 7" )  PainSc: 0-No pain   Body mass index is 37.53 kg/m.  Physical Exam Vitals reviewed.  Constitutional:  General: She is not in acute distress. Appearance: Normal appearance.  HENT:  Head: Normocephalic and atraumatic.  Right Ear: External ear normal.  Left Ear: External ear normal.  Nose: Nose normal.  Mouth/Throat:  Mouth: Mucous membranes are moist.  Pharynx: Oropharynx is clear.  Eyes:  General: No scleral icterus. Extraocular Movements: Extraocular movements intact.  Conjunctiva/sclera: Conjunctivae normal.  Pupils: Pupils are equal, round, and reactive to light.  Cardiovascular:  Rate and Rhythm: Normal rate and regular rhythm.  Pulses: Normal pulses.  Heart sounds: Normal heart sounds.  Pulmonary:  Effort: Pulmonary effort is normal. No respiratory distress.  Breath sounds: Normal breath sounds.   Abdominal:  General: Bowel sounds are normal.  Palpations: Abdomen is soft.  Tenderness: There is no abdominal tenderness.  Musculoskeletal:  General: No swelling, tenderness or deformity. Normal range of motion.  Cervical back: Normal range of motion and neck supple.  Skin: General: Skin is warm and dry.  Coloration: Skin is not jaundiced.  Neurological:  General: No focal deficit present.  Mental Status: She is alert and oriented to person, place, and time.  Psychiatric:  Mood and Affect: Mood normal.  Behavior: Behavior normal.     Breast: There is no palpable mass in either breast. There is no palpable axillary, supraclavicular, or cervical lymphadenopathy.  Labs, Imaging and Diagnostic Testing:  Assessment and Plan:   Diagnoses and all orders for this visit:  Malignant neoplasm of upper-inner quadrant of left breast in female, estrogen receptor positive (CMS/HHS-HCC)   The patient appears to have a 6 mm cancer in the upper inner quadrant of the left breast with clinically negative nodes and all favorable markers. I have discussed with her the different options for treatment and given her family history she is very interested in bilateral mastectomies with reconstruction. She will be a good candidate for sentinel node biopsy on the left. I have discussed with her in detail the risks and benefits of the operation as well as some of the technical aspects and she understands and wishes to proceed. I will refer her to plastic surgery to talk about reconstruction. I will also refer her to medical and radiation oncology to discuss adjuvant therapy as well as to physical therapy for lymphedema evaluation. I will also refer her to genetics given her family history. We will continue to move forward with surgical scheduling

## 2022-08-25 NOTE — Anesthesia Procedure Notes (Signed)
Anesthesia Regional Block: Pectoralis block   Pre-Anesthetic Checklist: , timeout performed,  Correct Patient, Correct Site, Correct Laterality,  Correct Procedure, Correct Position, site marked,  Risks and benefits discussed,  Surgical consent,  Pre-op evaluation,  At surgeon's request and post-op pain management  Laterality: Left  Prep: chloraprep       Needles:  Injection technique: Single-shot  Needle Type: Echogenic Needle     Needle Length: 9cm  Needle Gauge: 21     Additional Needles:   Procedures:,,,, ultrasound used (permanent image in chart),,    Narrative:  Start time: 08/25/2022 9:58 AM End time: 08/25/2022 10:04 AM Injection made incrementally with aspirations every 5 mL.  Performed by: Personally  Anesthesiologist: Collene Schlichter, MD  Additional Notes: No pain on injection. No increased resistance to injection. Injection made in 5cc increments.  Good needle visualization.  Patient tolerated procedure well.

## 2022-08-25 NOTE — Anesthesia Postprocedure Evaluation (Signed)
Anesthesia Post Note  Patient: April Meyer  Procedure(s) Performed: LEFT MASTECTOMY WITH SENTINEL LYMPH NODE BIOPSY (Left: Breast) RIGHT SIMPLE MASTECTOMY (Right: Breast)     Patient location during evaluation: PACU Anesthesia Type: General Level of consciousness: awake and alert Pain management: pain level controlled Vital Signs Assessment: post-procedure vital signs reviewed and stable Respiratory status: spontaneous breathing, nonlabored ventilation, respiratory function stable and patient connected to nasal cannula oxygen Cardiovascular status: blood pressure returned to baseline and stable Postop Assessment: no apparent nausea or vomiting Anesthetic complications: no   No notable events documented.  Last Vitals:  Vitals:   08/25/22 1515 08/25/22 1905  BP: 137/87 134/69  Pulse: 66 83  Resp: 16 16  Temp: 36.6 C   SpO2: 95% 97%    Last Pain:  Vitals:   08/25/22 1905  TempSrc:   PainSc: 4                  Collene Schlichter

## 2022-08-26 ENCOUNTER — Encounter (HOSPITAL_BASED_OUTPATIENT_CLINIC_OR_DEPARTMENT_OTHER): Payer: Self-pay | Admitting: General Surgery

## 2022-08-26 DIAGNOSIS — D0511 Intraductal carcinoma in situ of right breast: Secondary | ICD-10-CM | POA: Diagnosis not present

## 2022-08-26 LAB — GLUCOSE, CAPILLARY: Glucose-Capillary: 115 mg/dL — ABNORMAL HIGH (ref 70–99)

## 2022-08-26 NOTE — Discharge Summary (Signed)
Physician Discharge Summary  Patient ID: April Meyer MRN: [id number] DOB/AGE: Aug 02, 1965 57 y.o.  Admit date: 08/25/2022 Discharge date: 08/26/2022  Admission Diagnoses:  Discharge Diagnoses:  Principal Problem:   Cancer of left female breast [facility])   Discharged Condition: good  Hospital Course: The patient underwent bilateral mastectomies.  She tolerated the surgery well.  On postop day 1 she was ready for discharge home.  Consults: None  Significant Diagnostic Studies: None  Treatments: surgery: As above  Discharge Exam: Blood pressure 117/77, pulse 62, temperature 98.2 F (36.8 C), resp. rate 16, height 5\' 7"  (1.702 m), weight 106.4 kg, last menstrual period 08/10/2020, SpO2 100 %. Chest wall: Skin flaps look good  Disposition: Discharge disposition: 01-Home or Self Care       Discharge Instructions     Call MD for:  difficulty breathing, headache or visual disturbances   Complete by: As directed    Call MD for:  extreme fatigue   Complete by: As directed    Call MD for:  hives   Complete by: As directed    Call MD for:  persistant dizziness or light-headedness   Complete by: As directed    Call MD for:  persistant nausea and vomiting   Complete by: As directed    Call MD for:  redness, tenderness, or signs of infection (pain, swelling, redness, odor or green/yellow discharge around incision site)   Complete by: As directed    Call MD for:  severe uncontrolled pain   Complete by: As directed    Call MD for:  temperature >100.4   Complete by: As directed    Diet - low sodium heart healthy   Complete by: As directed    Discharge instructions   Complete by: As directed    Sponge bathe while drains are in.  No overhead activity while drains are in.  Empty drains, record output, recharge bulbs daily.  Wear compression top most of the time for the next couple weeks   Increase activity slowly   Complete by: As directed    No wound care   Complete by: As directed        Allergies as of 08/26/2022       Reactions   Advair Hfa [fluticasone-salmeterol] Anaphylaxis   Other Anaphylaxis   Steroids and Zucchini    Prednisone Anaphylaxis   Morphine And Codeine Itching   Severe        Medication List     TAKE these medications    acetaminophen 500 MG tablet Commonly known as: TYLENOL Take 1 tablet (500 mg total) by mouth every 6 (six) hours as needed (pain).   albuterol 108 (90 Base) MCG/ACT inhaler Commonly known as: VENTOLIN HFA Inhale 1-2 puffs into the lungs every 4 (four) hours as needed for wheezing or shortness of breath.   BIOFREEZE EX Apply 1 Application topically daily as needed (pain).   busPIRone 7.5 MG tablet Commonly known as: BUSPAR Take 1 tablet (7.5 mg total) by mouth 2 (two) times daily. What changed: when to take this   cyclobenzaprine 5 MG tablet Commonly known as: FLEXERIL Take 1 tablet (5 mg total) by mouth 3 (three) times daily as needed.   Dexilant 60 MG capsule Generic drug: dexlansoprazole Take 60 mg by mouth at bedtime.   gabapentin 100 MG capsule Commonly known as: Neurontin Take 1 capsule (100 mg total) by mouth at bedtime.   ibuprofen 600 MG tablet Commonly known as: ADVIL Take 1 tablet (600 mg  total) by mouth every 6 (six) hours as needed.   lamoTRIgine 25 MG tablet Commonly known as: LAMICTAL Take 1 tablet by mouth daily.   loratadine 10 MG tablet Commonly known as: CLARITIN Take 10 mg by mouth daily.   metFORMIN 500 MG 24 hr tablet Commonly known as: GLUCOPHAGE-XR Take 500 mg by mouth daily.   ondansetron 8 MG tablet Commonly known as: ZOFRAN Take 8 mg by mouth every 8 (eight) hours as needed for nausea or vomiting.   Oxycodone HCl 10 MG Tabs Take 1 tablet (10 mg total) by mouth 4 (four) times daily as needed. For pain What changed: Another medication with the same name was added. Make sure you understand how and when to take each.   oxyCODONE 5 MG immediate release  tablet Commonly known as: Roxicodone Take 1 tablet (5 mg total) by mouth every 6 (six) hours as needed for severe pain. What changed: You were already taking a medication with the same name, and this prescription was added. Make sure you understand how and when to take each.   polyethylene glycol powder 17 GM/SCOOP powder Commonly known as: GLYCOLAX/MIRALAX Take 17 g by mouth daily. Drink 17g (1 scoop) dissolved in water per day.   promethazine 12.5 MG tablet Commonly known as: PHENERGAN Take 1 tablet (12.5 mg total) by mouth 3 (three) times daily as needed.   rizatriptan 10 MG disintegrating tablet Commonly known as: MAXALT-MLT Take 10 mg by mouth as needed for migraine.   sertraline 25 MG tablet Commonly known as: ZOLOFT Take 1 tablet (25 mg total) by mouth daily.   Synthroid 137 MCG tablet Generic drug: levothyroxine Take 137 mcg by mouth every morning.   UNABLE TO FIND Take 1-2 tablets by mouth 2 (two) times daily as needed (sleep/pain). Cbd gummies   valACYclovir 1000 MG tablet Commonly known as: VALTREX TAKE 2 TABS (2 GRAMS) BY MOUTH NOW AND REPEAT IN 12 HOURS X 1, AS NEEDED FOR [place]        Follow-up Information     [name], MD Follow up in 2 week(s).   Specialty: General Surgery Contact information: [address] [place] [address] [place] [address] [phone]                 Signed: [name] 08/26/2022, 7:50 AM

## 2022-08-26 NOTE — Progress Notes (Signed)
1 Day Post-Op   Subjective/Chief Complaint: No complaints other than soreness   Objective: Vital signs in last 24 hours: Temp:  [97.2 F (36.2 C)-98.6 F (37 C)] 98.2 F (36.8 C) (06/18 0315) Pulse Rate:  [52-83] 62 (06/18 0602) Resp:  [11-17] 16 (06/17 1905) BP: (113-142)/(69-97) 117/77 (06/18 0315) SpO2:  [93 %-100 %] 100 % (06/18 0602) Weight:  [106.4 kg] 106.4 kg (06/17 0918)    Intake/Output from previous day: 06/17 0701 - 06/18 0700 In: 2480 [P.O.:1080; I.V.:1300; IV Piggyback:100] Out: 1655 [Urine:1400; Drains:205; Blood:50] Intake/Output this shift: No intake/output data recorded.  General appearance: alert and cooperative Resp: clear to auscultation bilaterally Chest wall: skin flaps look good Cardio: regular rate and rhythm GI: soft, non-tender; bowel sounds normal; no masses,  no organomegaly  Lab Results:  No results for input(s): "WBC", "HGB", "HCT", "PLT" in the last 72 hours. BMET No results for input(s): "NA", "K", "CL", "CO2", "GLUCOSE", "BUN", "CREATININE", "CALCIUM" in the last 72 hours. PT/INR No results for input(s): "LABPROT", "INR" in the last 72 hours. ABG No results for input(s): "PHART", "HCO3" in the last 72 hours.  Invalid input(s): "PCO2", "PO2"  Studies/Results: NM Sentinel Node Inj-No Rpt (Breast)  Result Date: 08/25/2022 Sulfur Colloid was injected by the Nuclear Medicine Technologist for sentinel lymph node localization.    Anti-infectives: Anti-infectives (From admission, onward)    Start     Dose/Rate Route Frequency Ordered Stop   08/25/22 0900  ceFAZolin (ANCEF) IVPB 2g/100 mL premix        2 g 200 mL/hr over 30 Minutes Intravenous On call to O.R. 08/25/22 0852 08/25/22 1104       Assessment/Plan: s/p Procedure(s): LEFT MASTECTOMY WITH SENTINEL LYMPH NODE BIOPSY (Left) RIGHT SIMPLE MASTECTOMY (Right) Advance diet Discharge Teach pt how to empty drains and record output  LOS: 0 days    April Meyer  III 08/26/2022

## 2022-08-27 ENCOUNTER — Other Ambulatory Visit (HOSPITAL_BASED_OUTPATIENT_CLINIC_OR_DEPARTMENT_OTHER): Payer: Self-pay

## 2022-08-27 LAB — SURGICAL PATHOLOGY

## 2022-08-27 MED ORDER — OXYCODONE HCL 10 MG PO TABS
10.0000 mg | ORAL_TABLET | Freq: Four times a day (QID) | ORAL | 0 refills | Status: DC | PRN
  Filled 2022-08-27: qty 120, 30d supply, fill #0

## 2022-08-28 ENCOUNTER — Encounter: Payer: Self-pay | Admitting: *Deleted

## 2022-08-29 ENCOUNTER — Other Ambulatory Visit (HOSPITAL_BASED_OUTPATIENT_CLINIC_OR_DEPARTMENT_OTHER): Payer: Self-pay

## 2022-09-02 ENCOUNTER — Ambulatory Visit: Payer: PPO | Admitting: Hematology and Oncology

## 2022-09-05 ENCOUNTER — Encounter (HOSPITAL_COMMUNITY): Payer: Self-pay

## 2022-09-05 ENCOUNTER — Encounter: Payer: Self-pay | Admitting: Hematology and Oncology

## 2022-09-12 ENCOUNTER — Telehealth: Payer: Self-pay | Admitting: *Deleted

## 2022-09-12 DIAGNOSIS — C50412 Malignant neoplasm of upper-outer quadrant of left female breast: Secondary | ICD-10-CM

## 2022-09-12 NOTE — Telephone Encounter (Signed)
Received oncotype score of 10. Physician team notified. 

## 2022-09-16 ENCOUNTER — Emergency Department (HOSPITAL_COMMUNITY)
Admission: EM | Admit: 2022-09-16 | Discharge: 2022-09-17 | Disposition: A | Discharge status: Home / Self Care | Payer: PPO | Attending: Emergency Medicine | Admitting: Emergency Medicine

## 2022-09-16 ENCOUNTER — Other Ambulatory Visit: Payer: Self-pay

## 2022-09-16 ENCOUNTER — Encounter (HOSPITAL_COMMUNITY): Payer: Self-pay

## 2022-09-16 DIAGNOSIS — Z7984 Long term (current) use of oral hypoglycemic drugs: Secondary | ICD-10-CM | POA: Diagnosis not present

## 2022-09-16 DIAGNOSIS — D72829 Elevated white blood cell count, unspecified: Secondary | ICD-10-CM | POA: Insufficient documentation

## 2022-09-16 DIAGNOSIS — E119 Type 2 diabetes mellitus without complications: Secondary | ICD-10-CM | POA: Diagnosis not present

## 2022-09-16 DIAGNOSIS — R079 Chest pain, unspecified: Secondary | ICD-10-CM | POA: Diagnosis not present

## 2022-09-16 DIAGNOSIS — D649 Anemia, unspecified: Secondary | ICD-10-CM | POA: Diagnosis not present

## 2022-09-16 DIAGNOSIS — R519 Headache, unspecified: Secondary | ICD-10-CM | POA: Insufficient documentation

## 2022-09-16 DIAGNOSIS — J45909 Unspecified asthma, uncomplicated: Secondary | ICD-10-CM | POA: Insufficient documentation

## 2022-09-16 DIAGNOSIS — Z853 Personal history of malignant neoplasm of breast: Secondary | ICD-10-CM | POA: Diagnosis not present

## 2022-09-16 DIAGNOSIS — T8140XA Infection following a procedure, unspecified, initial encounter: Secondary | ICD-10-CM | POA: Diagnosis not present

## 2022-09-16 DIAGNOSIS — R509 Fever, unspecified: Secondary | ICD-10-CM | POA: Insufficient documentation

## 2022-09-16 DIAGNOSIS — L089 Local infection of the skin and subcutaneous tissue, unspecified: Secondary | ICD-10-CM | POA: Insufficient documentation

## 2022-09-16 DIAGNOSIS — E039 Hypothyroidism, unspecified: Secondary | ICD-10-CM | POA: Diagnosis not present

## 2022-09-16 DIAGNOSIS — Z79899 Other long term (current) drug therapy: Secondary | ICD-10-CM | POA: Insufficient documentation

## 2022-09-16 NOTE — ED Triage Notes (Signed)
Pt bib EMS for possible wound infection, double mastectomy done on the the 16th and drainage tubes removed Friday. Pt with a temp of 101.9 at home yesterday, tylenol at 0400, redness and swelling noted to left breast, Started on Augmentin yesterday and was recommended to come to the end. 18 G RAC and 1 L LR given by EMS. Pt is a limb restrictions on the L due to lymph node removal. Pt states before finding out she had breast cancer on May 3rd, pt was having issues with her shunt due to her brain compressing on the shunt and not regulating. Pt states she has been having worsening headaches

## 2022-09-17 ENCOUNTER — Encounter (HOSPITAL_COMMUNITY): Payer: Self-pay

## 2022-09-17 ENCOUNTER — Other Ambulatory Visit (HOSPITAL_BASED_OUTPATIENT_CLINIC_OR_DEPARTMENT_OTHER): Payer: Self-pay

## 2022-09-17 ENCOUNTER — Emergency Department (HOSPITAL_COMMUNITY): Payer: PPO

## 2022-09-17 LAB — COMPREHENSIVE METABOLIC PANEL
ALT: 13 U/L (ref 0–44)
AST: 15 U/L (ref 15–41)
Albumin: 3.5 g/dL (ref 3.5–5.0)
Alkaline Phosphatase: 63 U/L (ref 38–126)
Anion gap: 8 (ref 5–15)
BUN: 13 mg/dL (ref 6–20)
CO2: 24 mmol/L (ref 22–32)
Calcium: 8.4 mg/dL — ABNORMAL LOW (ref 8.9–10.3)
Chloride: 102 mmol/L (ref 98–111)
Creatinine, Ser: 0.8 mg/dL (ref 0.44–1.00)
GFR, Estimated: >60 mL/min (ref >60)
Glucose, Bld: 106 mg/dL — ABNORMAL HIGH (ref 70–99)
Potassium: 4.1 mmol/L (ref 3.5–5.1)
Sodium: 134 mmol/L — ABNORMAL LOW (ref 135–145)
Total Bilirubin: 0.3 mg/dL (ref 0.3–1.2)
Total Protein: 6.9 g/dL (ref 6.5–8.1)

## 2022-09-17 LAB — CBC
HCT: 32.1 % — ABNORMAL LOW (ref 36.0–46.0)
Hemoglobin: 10.4 g/dL — ABNORMAL LOW (ref 12.0–15.0)
MCH: 28.8 pg (ref 26.0–34.0)
MCHC: 32.4 g/dL (ref 30.0–36.0)
MCV: 88.9 fL (ref 80.0–100.0)
Platelets: 201 10*3/uL (ref 150–400)
RBC: 3.61 MIL/uL — ABNORMAL LOW (ref 3.87–5.11)
RDW: 14 % (ref 11.5–15.5)
WBC: 11 10*3/uL — ABNORMAL HIGH (ref 4.0–10.5)
nRBC: 0 % (ref 0.0–0.2)

## 2022-09-17 LAB — LACTIC ACID, PLASMA: Lactic Acid, Venous: 0.6 mmol/L (ref 0.5–1.9)

## 2022-09-17 MED ORDER — SODIUM CHLORIDE 0.9 % IV SOLN
3.0000 g | Freq: Once | INTRAVENOUS | Status: AC
  Administered 2022-09-17: 3 g via INTRAVENOUS
  Filled 2022-09-17: qty 8

## 2022-09-17 MED ORDER — IOHEXOL 300 MG/ML  SOLN
75.0000 mL | Freq: Once | INTRAMUSCULAR | Status: AC | PRN
  Administered 2022-09-17: 75 mL via INTRAVENOUS

## 2022-09-17 MED ORDER — PROCHLORPERAZINE EDISYLATE 10 MG/2ML IJ SOLN
10.0000 mg | Freq: Once | INTRAMUSCULAR | Status: AC
  Administered 2022-09-17: 10 mg via INTRAVENOUS
  Filled 2022-09-17: qty 2

## 2022-09-17 MED ORDER — OXYCODONE HCL 10 MG PO TABS
10.0000 mg | ORAL_TABLET | Freq: Four times a day (QID) | ORAL | 0 refills | Status: DC | PRN
  Filled 2022-10-28: qty 120, 30d supply, fill #0

## 2022-09-17 MED ORDER — SODIUM CHLORIDE (PF) 0.9 % IJ SOLN
INTRAMUSCULAR | Status: AC
  Filled 2022-09-17: qty 50

## 2022-09-17 MED ORDER — OXYCODONE HCL 10 MG PO TABS
10.0000 mg | ORAL_TABLET | Freq: Four times a day (QID) | ORAL | 0 refills | Status: DC | PRN
  Filled 2022-09-29: qty 120, 30d supply, fill #0

## 2022-09-17 MED ORDER — SODIUM CHLORIDE 0.9 % IV BOLUS
1000.0000 mL | Freq: Once | INTRAVENOUS | Status: AC
  Administered 2022-09-17: 1000 mL via INTRAVENOUS

## 2022-09-17 NOTE — ED Notes (Signed)
Patient transported to CT 

## 2022-09-17 NOTE — Discharge Instructions (Signed)
Thank you for allowing [place] to take care of you today.  We hope you begin feeling better soon.  To-Do: Please follow-up with your surgeon today. Continue taking your antibiotics as prescribed Please return to the Emergency Department or call 911 if you experience chest pain, shortness of breath, severe pain, severe fever, altered mental status, or have any reason to think that you need emergency medical care.  Thank you again.  Hope you feel better soon.  Department of Emergency Medicine [facility]

## 2022-09-17 NOTE — ED Provider Notes (Signed)
[place] EMERGENCY DEPARTMENT AT [facility] Provider Note  CSN: [id number] Arrival date & time: 09/16/22 2257  Chief Complaint(s) Wound Infection, Headache (Pt bib EMS for possible wound infection, double mastectomy done on the the 16th and drainage tubes removed Friday. Pt with a temp of 101.9 at home yesterday, tylenol at 0400, redness and swelling noted to left breast, Started on Augmentin yesterday and was recommended to come to the end. 18 G RAC and 1 L LR given by EMS. Pt is a limb restrictions on the L due to lymph node removal), and Fever  HPI April Meyer is a 57 y.o. female with a past medical history listed below including left breast cancer status post bilateral mastectomy on June 17 who presents to the emergency department with left-sided chest redness and tenderness.  She had J-p drains removed 7/8. Left chest now red and tender since that evening. Had fever 101.9 that evening as well. Augmentin called in. Took 1 dose thus far.  Additionally she is endorsing headache similar to her prior headaches related to her IIH.   The history is provided by the patient.     Past Medical History Past Medical History:  Diagnosis Date   ADHD (attention deficit hyperactivity disorder)    no meds for   Anxiety    Arnold-Chiari deformity (HCC)    Asthma    AVM (arteriovenous malformation) spine 04/21/2018   Breast cancer (HCC) 07/10/2022   Left   Bruises easily    Cerebral venous sinus thrombosis, remote, resolved 03/28/2013   Chronic pain    Common migraine with intractable migraine 04/21/2018   Complication of anesthesia age 46    breast lumpectomy heart rate went way down in hospital stayed 2 days, no problems since   Complication of anesthesia    C 1 to C 3 fusion side to side and up and down limited    EDS (Ehlers-Danlos syndrome)    Eustachian tube dysfunction    Fatigue    GERD (gastroesophageal reflux disease)    Glioma (HCC)    low graded tectal plate glioma    Hemorrhoids    History of sleep apnea    no cpap used last 2 years, normal sleep study 2 yrs ago   HSV infection    Hypothyroid    Hypothyroidism    IIH (idiopathic intracranial hypertension)    Lumbar disc herniation    Migraine    Neck stiffness    Neuromuscular disorder (HCC)    Ehlers-Danlos Syndrome   Nystagmus, positional, central type 03/28/2013   Other malaise and fatigue 03/28/2013   " The patient reports feeling happy, ED, sore" on she has undergone at Chiari malformation surgery decompression in 2011. Prior to the surgery were chief complaints were weakness numbness and neck problems. She had swallowing difficulties and dizziness;  Classic Chiari presentation. But she didn't have headaches.   Photophobia    Pneumonia    Pre-diabetes    Tinnitus of both ears mainly in left ear    Urinary incontinence    Vision disturbance    Vitamin D deficiency    Weakness    Wears glasses    Patient Active Problem List   Diagnosis Date Noted   Cancer of left female breast (HCC) 08/25/2022   Genetic testing 07/24/2022   Primary malignant neoplasm of upper outer quadrant of female breast, left (HCC) 07/16/2022   Malignant neoplasm of upper-inner quadrant of left breast in female, estrogen receptor positive (HCC) 07/15/2022  Venous insufficiency (chronic) (peripheral) 07/15/2021   Varicose veins of bilateral lower extremities with pain 06/10/2021   Narcotic overdose (HCC) 01/30/2021   DM2 (diabetes mellitus, type 2) (HCC) 01/30/2021   GERD (gastroesophageal reflux disease) 01/30/2021   Acute respiratory failure with hypoxia and hypercapnia (HCC) 01/30/2021   Raynaud's phenomenon 11/02/2020   Rotator cuff arthropathy of left shoulder 11/02/2020   Scoliosis 11/02/2020   Pain in joint involving pelvic region and thigh 11/02/2020   S/P VP shunt 06/19/2020   Attention deficit hyperactivity disorder 06/12/2020   Chronic low back pain 06/12/2020   Chronic obstructive pulmonary disease,  unspecified (HCC) 06/12/2020   Compression of brain (HCC) 06/12/2020   Ehlers-Danlos syndrome, unspecified 06/12/2020   Prediabetes    Hypothyroid    IIH (idiopathic intracranial hypertension) 11/01/2018   AVM (arteriovenous malformation) spine 04/21/2018   Common migraine with intractable migraine 04/21/2018   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   Other malaise and fatigue 03/28/2013   History of Chiari malformation 03/28/2013   Hypersomnia, persistent 03/28/2013   Cerebral venous sinus thrombosis, remote, resolved 03/28/2013   Nystagmus, positional, central type 03/28/2013   Primary hypercoagulable state (HCC) 05/26/2012   Thrombosis, superior sagittal sinus 05/26/2012   Menorrhagia 07/31/2011   Anxiety 07/30/2011   History of tobacco use 07/30/2011   Obesity 04/15/2011   Obstructive sleep apnea 04/15/2011   Asthma 04/15/2011   Glioma of brain (HCC) 04/15/2011   Herniated lumbar intervertebral disc 04/15/2011   Home Medication(s) Prior to Admission medications   Medication Sig Start Date End Date Taking? Authorizing Provider  acetaminophen (TYLENOL) 500 MG tablet Take 500 mg by mouth every 6 (six) hours as needed for moderate pain or fever.   Yes [provider]  albuterol (VENTOLIN HFA) 108 (90 Base) MCG/ACT inhaler Inhale 1-2 puffs into the lungs every 4 (four) hours as needed for wheezing or shortness of breath.   Yes [provider]  busPIRone (BUSPAR) 7.5 MG tablet Take 1 tablet (7.5 mg total) by mouth 2 (two) times daily. Patient taking differently: Take 7.5 mg by mouth daily. 03/26/21  Yes Plovsky, Earvin Hansen, MD  cyclobenzaprine (FLEXERIL) 5 MG tablet Take 1 tablet (5 mg total) by mouth 3 (three) times daily as needed. 04/29/22  Yes   dexlansoprazole (DEXILANT) 60 MG capsule Take 60 mg by mouth at bedtime.   Yes [provider]  gabapentin (NEURONTIN) 100 MG capsule Take 1 capsule (100 mg total) by mouth at bedtime. Patient taking differently: Take  100 mg by mouth at bedtime as needed (hot flashes). 07/31/22  Yes Romualdo Bolk, MD  ibuprofen (ADVIL) 200 MG tablet Take 200 mg by mouth every 6 (six) hours as needed for fever or moderate pain.   Yes [provider]  ipratropium-albuterol (DUONEB) 0.5-2.5 (3) MG/3ML SOLN Take 3 mLs by nebulization every 6 (six) hours as needed (sob/wheezing). 05/05/22  Yes [provider]  lamoTRIgine (LAMICTAL) 25 MG tablet Take 1 tablet by mouth daily. 04/14/20  Yes [provider]  loratadine (CLARITIN) 10 MG tablet Take 10 mg by mouth daily.   Yes [provider]  Menthol, Topical Analgesic, (BIOFREEZE EX) Apply 1 Application topically daily as needed (pain).   Yes [provider]  metFORMIN (GLUCOPHAGE-XR) 500 MG 24 hr tablet Take 500 mg by mouth daily. 03/12/20  Yes [provider]  Oxycodone HCl 10 MG TABS Take 1 tablet (10 mg total) by mouth 4 (four) times daily as needed for pain 08/27/22  Yes   polyethylene  glycol powder (GLYCOLAX/MIRALAX) 17 GM/SCOOP powder Take 17 g by mouth daily. Drink 17g (1 scoop) dissolved in water per day. Patient taking differently: Take 17 g by mouth daily as needed for moderate constipation. Drink 17g (1 scoop) dissolved in water 11/01/21  Yes Marguerita Beards, MD  promethazine (PHENERGAN) 12.5 MG tablet Take 1 tablet (12.5 mg total) by mouth 3 (three) times daily as needed. 05/27/22  Yes   rizatriptan (MAXALT-MLT) 10 MG disintegrating tablet Take 10 mg by mouth as needed for migraine. 04/10/21  Yes [provider]  sertraline (ZOLOFT) 25 MG tablet Take 1 tablet (25 mg total) by mouth daily. 08/13/22  Yes Plovsky, Earvin Hansen, MD  SYNTHROID 137 MCG tablet Take 137 mcg by mouth every morning. 10/13/20  Yes [provider]  UNABLE TO FIND Take 1-2 tablets by mouth 2 (two) times daily as needed (sleep/pain). Cbd gummies   Yes [provider]  valACYclovir (VALTREX) 1000 MG tablet TAKE 2 TABS (2 GRAMS) BY MOUTH  NOW AND REPEAT IN 12 HOURS X 1, AS NEEDED FOR COLDSORE Patient taking differently: Take 1,000-2,000 mg by mouth daily as needed (cold sores). TAKE 2 TABS (2 GRAMS) BY MOUTH NOW AND REPEAT IN 12 HOURS X 1, AS NEEDED FOR COLDSORE 10/30/20  Yes Romualdo Bolk, MD  acetaminophen (TYLENOL) 500 MG tablet Take 1 tablet (500 mg total) by mouth every 6 (six) hours as needed (pain). Patient not taking: Reported on 09/17/2022 11/01/21   Marguerita Beards, MD  ibuprofen (ADVIL) 600 MG tablet Take 1 tablet (600 mg total) by mouth every 6 (six) hours as needed. Patient not taking: Reported on 09/17/2022 11/01/21   Marguerita Beards, MD  ondansetron (ZOFRAN) 8 MG tablet Take 8 mg by mouth every 8 (eight) hours as needed for nausea or vomiting.    [provider]  oxyCODONE (ROXICODONE) 5 MG immediate release tablet Take 1 tablet (5 mg total) by mouth every 6 (six) hours as needed for severe pain. 08/25/22   Griselda Miner, MD                                                                                                                                    Allergies Advair hfa [fluticasone-salmeterol], Other, Prednisone, and Morphine and codeine  Review of Systems Review of Systems As noted in HPI  Physical Exam Vital Signs  I have reviewed the triage vital signs BP 122/68 (BP Location: Right Arm)   Pulse 84   Temp 98.7 F (37.1 C) (Oral)   Resp 17   Ht 5\' 7"  (1.702 m)   Wt 99.8 kg   LMP 08/10/2020   SpO2 97%   BMI 34.46 kg/m   Physical Exam Vitals reviewed.  Constitutional:      General: She is not in acute distress.    Appearance: She is well-developed. She is not diaphoretic.  HENT:     Head: Normocephalic  and atraumatic.     Nose: Nose normal.  Eyes:     General: No scleral icterus.       Right eye: No discharge.        Left eye: No discharge.     Conjunctiva/sclera: Conjunctivae normal.     Pupils: Pupils are equal, round, and reactive to light.  Cardiovascular:      Rate and Rhythm: Normal rate and regular rhythm.     Heart sounds: No murmur heard.    No friction rub. No gallop.  Pulmonary:     Effort: Pulmonary effort is normal. No respiratory distress.     Breath sounds: Normal breath sounds. No stridor. No rales.  Chest:     Chest wall: Tenderness present.  Breasts:    Left: Skin change (erythema) and tenderness present.  Abdominal:     General: There is no distension.     Palpations: Abdomen is soft.     Tenderness: There is no abdominal tenderness.  Musculoskeletal:        General: No tenderness.     Cervical back: Normal range of motion and neck supple.  Skin:    General: Skin is warm and dry.     Findings: No erythema or rash.  Neurological:     Mental Status: She is alert and oriented to person, place, and time.     ED Results and Treatments Labs (all labs ordered are listed, but only abnormal results are displayed) Labs Reviewed  CBC - Abnormal; Notable for the following components:      Result Value   WBC 11.0 (*)    RBC 3.61 (*)    Hemoglobin 10.4 (*)    HCT 32.1 (*)    All other components within normal limits  COMPREHENSIVE METABOLIC PANEL - Abnormal; Notable for the following components:   Sodium 134 (*)    Glucose, Bld 106 (*)    Calcium 8.4 (*)    All other components within normal limits  LACTIC ACID, PLASMA                                                                                                                         EKG  EKG Interpretation Date/Time:    Ventricular Rate:    PR Interval:    QRS Duration:    QT Interval:    QTC Calculation:   R Axis:      Text Interpretation:         Radiology CT Chest W Contrast  Result Date: 09/17/2022 CLINICAL DATA:  Fever, redness and swelling left breast area. According to Epic surgical history underwent double mastectomy 08/25/2022 with sentinel node biopsies. EXAM: CT CHEST WITH CONTRAST TECHNIQUE: Multidetector CT imaging of the chest was performed  during intravenous contrast administration. RADIATION DOSE REDUCTION: This exam was performed according to the departmental dose-optimization program which includes automated exposure control, adjustment of the mA and/or kV according to patient size and/or use of iterative reconstruction technique. CONTRAST:  75mL OMNIPAQUE IOHEXOL 300 MG/ML  SOLN COMPARISON:  CT abdomen and pelvis 01/03/2019.  No prior chest CT. FINDINGS: Cardiovascular: The cardiac size is normal. There is no pericardial effusion. There are no visible coronary calcifications. The pulmonary arteries and veins are normal in caliber. The pulmonary arteries are centrally clear. The aorta and great vessels are normal. Mediastinum/Nodes: There is a moderate-sized hiatal hernia, slightly larger than in 2020. Thoracic esophagus is unremarkable. No thyroid or axillary mass is seen. There are surgical clips in the low axillary tail regions. There is no intrathoracic adenopathy. The trachea and main bronchi are patent. Lungs/Pleura: No pleural effusion, thickening or pneumothorax. There has been a prior resection of the posterior left fifth rib, with extrapleural fat protruding into the ribcage defect indenting the dorsal pleura and left upper lobe, level of the aortic arch. There are reticulated scar-like opacities in the extreme lung apices. There is mild diffuse bronchial thickening without visible bronchial plugging or bronchiectasis. The lungs are clear of infiltrates and nodules. Upper Abdomen: There is VP shunt tubing in the anterior right upper abdomen. 6 mm nonobstructive caliceal stone superior pole left kidney. No acute or other significant upper abdominal findings. Old cholecystectomy without biliary prominence. Musculoskeletal: There is skin thickening and subcutaneous stranding bilaterally in the anterior chest wall, on the right also with scattered subcutaneous air pockets. If the mastectomy surgery really was 23 days ago there should no  longer be postoperative soft tissue gas. This raises the concern for a gas-forming infection. On the left, there is a subcutaneous fluid collection anterior to the chest wall underlying the stranding changes and measuring 15.6 cm transverse, 3 cm AP, and 11 cm craniocaudal and could be a seroma, hematoma or abscess. Hounsfield density ranges from 9 to 38. There are mild degenerative changes and kyphosis thoracic spine. No aggressive bone lesion is seen. IMPRESSION: 1. Bilateral skin thickening and subcutaneous stranding in the anterior chest wall, on the right also with scattered subcutaneous air pockets. If the mastectomies really were 23 days ago there should no longer be postoperative soft tissue gas, raising concern for a gas-forming infection. 2. On the left, there is a 15.6 x 3 x 11 cm subcutaneous fluid collection anterior to the chest wall, which could be a seroma, hematoma or abscess. 3. Prior resection of the posterior left fifth rib with extrapleural fat protruding into the ribcage defect indenting the dorsal pleura and left upper lobe, level of the aortic arch. 4. Moderate-sized hiatal hernia. 5. Nonobstructive left renal caliceal stone. Electronically Signed   By: Almira Bar M.D.   On: 09/17/2022 02:00    Medications Ordered in ED Medications  prochlorperazine (COMPAZINE) injection 10 mg (10 mg Intravenous Given 09/17/22 0131)  sodium chloride 0.9 % bolus 1,000 mL (0 mLs Intravenous Stopped 09/17/22 0254)  iohexol (OMNIPAQUE) 300 MG/ML solution 75 mL (75 mLs Intravenous Contrast Given 09/17/22 0114)  Ampicillin-Sulbactam (UNASYN) 3 g in sodium chloride 0.9 % 100 mL IVPB (3 g Intravenous New Bag/Given 09/17/22 0429)   Procedures Procedures  (including critical care time) Medical Decision Making / ED Course   Medical Decision Making Amount and/or Complexity of Data Reviewed Labs: ordered. Radiology: ordered.  Risk Prescription drug management.    Cellulitis and tenderness to the  left chest wall status postmastectomy. Will assess for developing abscess.  CBC with leukocytosis and mild anemia. Metabolic panel without significant electrolyte derangements or renal sufficiency. Lactic acid normal CT scan notable for subcutaneous stranding bilaterally likely related to recent  drains being pulled.  Unlikely necrotizing infection. There is fluid collection in the subcutaneous region on the left concerning for either seroma, hematoma or abscess.  Clinical Course as of 09/17/22 0540  Wed Sep 17, 2022  0302 Consulted and spoke with Dr. Magnus Ivan from general surgery who recommended patient be discharged home with close follow-up in the clinic later on today for aspiration. [PC]    Clinical Course User Index [PC] Gildardo Tickner, Amadeo Garnet, MD   Patient given dose of unasyn in the ER.  Final Clinical Impression(s) / ED Diagnoses Final diagnoses:  Wound infection   The patient appears reasonably screened and/or stabilized for discharge and I doubt any other medical condition or other [facility] requiring further screening, evaluation, or treatment in the ED at this time. I have discussed the findings, Dx and Tx plan with the patient/family who expressed understanding and agree(s) with the plan. Discharge instructions discussed at length. The patient/family was given strict return precautions who verbalized understanding of the instructions. No further questions at time of discharge.  Disposition: Discharge  Condition: Good  ED Discharge Orders     None         Follow Up: Griselda Miner, MD [address] [place] [address] [phone]  Call  to schedule an appointment for close follow up    This chart was dictated using voice recognition software.  Despite best efforts to proofread,  errors can occur which can change the documentation meaning.    Nira Conn, MD 09/17/22 [phone]

## 2022-09-18 ENCOUNTER — Inpatient Hospital Stay: Payer: PPO

## 2022-09-18 ENCOUNTER — Other Ambulatory Visit: Payer: Self-pay

## 2022-09-18 ENCOUNTER — Encounter: Payer: Self-pay | Admitting: Hematology and Oncology

## 2022-09-18 ENCOUNTER — Inpatient Hospital Stay: Payer: PPO | Attending: Hematology and Oncology | Admitting: Hematology and Oncology

## 2022-09-18 VITALS — BP 125/76 | HR 75 | Temp 98.1°F | Resp 16 | Wt 229.7 lb

## 2022-09-18 DIAGNOSIS — Q796 Ehlers-Danlos syndrome, unspecified: Secondary | ICD-10-CM | POA: Diagnosis not present

## 2022-09-18 DIAGNOSIS — Z9013 Acquired absence of bilateral breasts and nipples: Secondary | ICD-10-CM | POA: Insufficient documentation

## 2022-09-18 DIAGNOSIS — Z86718 Personal history of other venous thrombosis and embolism: Secondary | ICD-10-CM | POA: Diagnosis not present

## 2022-09-18 DIAGNOSIS — Z808 Family history of malignant neoplasm of other organs or systems: Secondary | ICD-10-CM | POA: Diagnosis not present

## 2022-09-18 DIAGNOSIS — Z170 Estrogen receptor positive status [ER+]: Secondary | ICD-10-CM | POA: Insufficient documentation

## 2022-09-18 DIAGNOSIS — Z803 Family history of malignant neoplasm of breast: Secondary | ICD-10-CM | POA: Diagnosis not present

## 2022-09-18 DIAGNOSIS — Z87891 Personal history of nicotine dependence: Secondary | ICD-10-CM | POA: Diagnosis not present

## 2022-09-18 DIAGNOSIS — C50412 Malignant neoplasm of upper-outer quadrant of left female breast: Secondary | ICD-10-CM

## 2022-09-18 DIAGNOSIS — Z801 Family history of malignant neoplasm of trachea, bronchus and lung: Secondary | ICD-10-CM | POA: Diagnosis not present

## 2022-09-18 DIAGNOSIS — Z90710 Acquired absence of both cervix and uterus: Secondary | ICD-10-CM | POA: Diagnosis not present

## 2022-09-18 NOTE — Assessment & Plan Note (Addendum)
This is a very pleasant 57 year old postmenopausal female patient with newly diagnosed left breast invasive ductal carcinoma, grade 1, ER/PR positive, HER2 1+, Ki-67 of 5% referred to medical oncology for additional recommendations.  Given strong family history of breast cancer, patient is electing to proceed with bilateral mastectomies, no reconstruction given history of Ehlers-Danlos syndrome. Given small tumor, strong ER/PR positivity and low proliferation index, she went with surgery first.  Final pathology showed 8 mm grade 1 tumor in the left breast, negative margins, no evidence of lymph node involvement.  No evidence of malignancy in the right breast.  She is now status post bilateral mastectomy, no indication for adjuvant chemo or adjuvant radiation.  Oncotype DX of 10.  We have once again today discussed about the role of antiestrogen therapy.  Since she had a history of cerebral venous sinus thrombus, she may have to consider ovarian suppression with aromatase inhibitors if she is premenopausal or aromatase inhibitors only if she is postmenopausal.  She is very reluctant to consider antiestrogen therapy.  We have discussed the benefits of antiestrogen therapy which is to reduce the risk of recurrence both locally and systemically.  She understands if the breast cancer were to recur outside the breast, it becomes an advanced breast cancer.  She would like to proceed with the labs for menopausal status today.  She wants [place] to call her in a few weeks to see if she has changed her mind about antiestrogen therapy.

## 2022-09-18 NOTE — Progress Notes (Signed)
[facility] CONSULT NOTE  Patient Care Team: Dois Davenport, MD as PCP - General (Family Medicine) Pershing Proud, RN as Oncology Nurse Navigator Donnelly Angelica, RN as Oncology Nurse Navigator Rachel Moulds, MD as Consulting Physician (Hematology and Oncology)  CHIEF COMPLAINTS/PURPOSE OF CONSULTATION:  Newly diagnosed breast cancer  HISTORY OF PRESENTING ILLNESS:  April Meyer 57 y.o. female is here because of recent diagnosis of left breast cancer.  I reviewed her records extensively and collaborated the history with the patient.  SUMMARY OF ONCOLOGIC HISTORY: Oncology History  Primary malignant neoplasm of upper outer quadrant of female breast, left (HCC)  07/16/2022 Initial Diagnosis   Primary malignant neoplasm of upper outer quadrant of female breast, left (HCC)   07/16/2022 Cancer Staging   Staging form: Breast, AJCC 8th Edition - Clinical stage from 07/16/2022: Stage IA (cT1b, cN0, cM0, G1, ER+, PR+, HER2-) - Signed by Ronny Bacon, PA-C on 07/16/2022 Stage prefix: Initial diagnosis Method of lymph node assessment: Clinical Histologic grading system: 3 grade system   07/23/2022 Genetic Testing   Negative [facility] Breast Cancer STAT Panel.  Report date 07/23/2022.   The STAT Breast cancer panel offered by [facility] includes sequencing and rearrangement analysis for the following 9 genes:  ATM, BRCA1, BRCA2, CDH1, CHEK2, PALB2, PTEN, STK11 and TP53.  Pan-cancer panel is pending.     This is a very pleasant 57 year old perimenopausal female patient with past medical history significant for cerebral venous sinus thrombus, Arnold-Chiari deformity, ADHD, Ehlers-Danlos syndrome with screen detected left breast grade 1 IDC, ER/PR positive HER2 1+, low proliferation index referred to medical oncology.  She is contemplating bilateral mastectomy but since she has Erler's Danlos syndrome, she is not a good candidate for reconstruction.  She also had history of cerebral  venous sinus thrombus, no DVT/PE.  Her last menstrual cycle before hysterectomy was about 5 months prior hence she may not be quite in menopause.  She had right breast biopsy many years ago which was benign.   She had bilateral mastectomy since her last visit, left mastectomy showed grade 1 invasive ductal carcinoma measuring 8 x 6 x 6 mm no residual DCIS, negative margins, prognostic markers showed ER/PR positive HER2 negative, Ki-67 of 5%, all lymph nodes negative for malignancy.  There is no invasive breast cancer in the right breast.  Oncotype Dx score of 10, she comes here today to discuss once again about antiestrogen therapy.  She tells me that she is not quite sure if she wants to do that.  Since she cannot take tamoxifen, she is worried that she may need to take ovarian suppression as well as antiestrogen therapy if she is indeed not postmenopausal.  She tells me that she may take her chances and just continue with surveillance but would like to know her menopausal status today.  She had fall, mostly mechanical but she is not entirely sure if she was a bit dizzy that day.  This happened the day after surgery.  She has some bruising but did not have any major injury otherwise.  Rest of the pertinent 10 point ROS reviewed and negative  MEDICAL HISTORY:  Past Medical History:  Diagnosis Date   ADHD (attention deficit hyperactivity disorder)    no meds for   Anxiety    Arnold-Chiari deformity (HCC)    Asthma    AVM (arteriovenous malformation) spine 04/21/2018   Breast cancer (HCC) 07/10/2022   Left   Bruises easily    Cerebral  venous sinus thrombosis, remote, resolved 03/28/2013   Chronic pain    Common migraine with intractable migraine 04/21/2018   Complication of anesthesia age 89    breast lumpectomy heart rate went way down in hospital stayed 2 days, no problems since   Complication of anesthesia    C 1 to C 3 fusion side to side and up and down limited    EDS (Ehlers-Danlos  syndrome)    Eustachian tube dysfunction    Fatigue    GERD (gastroesophageal reflux disease)    Glioma (HCC)    low graded tectal plate glioma   Hemorrhoids    History of sleep apnea    no cpap used last 2 years, normal sleep study 2 yrs ago   HSV infection    Hypothyroid    Hypothyroidism    IIH (idiopathic intracranial hypertension)    Lumbar disc herniation    Migraine    Neck stiffness    Neuromuscular disorder (HCC)    Ehlers-Danlos Syndrome   Nystagmus, positional, central type 03/28/2013   Other malaise and fatigue 03/28/2013   " The patient reports feeling happy, ED, sore" on she has undergone at Chiari malformation surgery decompression in 2011. Prior to the surgery were chief complaints were weakness numbness and neck problems. She had swallowing difficulties and dizziness;  Classic Chiari presentation. But she didn't have headaches.   Photophobia    Pneumonia    Pre-diabetes    Tinnitus of both ears mainly in left ear    Urinary incontinence    Vision disturbance    Vitamin D deficiency    Weakness    Wears glasses     SURGICAL HISTORY: Past Surgical History:  Procedure Laterality Date   ARNOLD CHIARI REPAIR  2010   BLADDER SUSPENSION N/A 11/21/2021   Procedure: SINGLE INCISION SLING PROCEDURE (Altis Sling);  Surgeon: Marguerita Beards, MD;  Location: [facility];  Service: Gynecology;  Laterality: N/A;   Brain shunt  12/30/2019   vp shunt   BRAIN SURGERY     BREAST BIOPSY Left    lumpectomy age 72    BREAST BIOPSY Left 07/10/2022   [place] LT BREAST BX W LOC DEV 1ST LESION IMG BX SPEC US GUIDE 07/10/2022 [facility]   BREAST EXCISIONAL BIOPSY Right yrs ago   CHOLECYSTECTOMY  age 80   laparoscopic   colonscopy  2012, 2018   CYSTOSCOPY N/A 11/21/2021   Procedure: CYSTOSCOPY;  Surgeon: Marguerita Beards, MD;  Location: [facility];  Service: Gynecology;  Laterality: N/A;   DILATION AND CURETTAGE OF UTERUS  last done yrs ago   x 2 or 3   ENDOMETRIAL ABLATION   02/09/2013   [facility]   ESOPHAGOGASTRODUODENOSCOPY N/A 02/05/2021   Procedure: ESOPHAGOGASTRODUODENOSCOPY (EGD);  Surgeon: Jeani Hawking, MD;  Location: [facility];  Service: Endoscopy;  Laterality: N/A;   HYSTEROSCOPY WITH D & C N/A 06/26/2020   Procedure: DILATATION AND CURETTAGE /HYSTEROSCOPY;  Surgeon: Romualdo Bolk, MD;  Location: [facility];  Service: Gynecology;  Laterality: N/A;   INTRAUTERINE DEVICE INSERTION  10/2010   Mirena   IUD REMOVAL  11/2010   could not tolerate hormonal side effects   MASTECTOMY W/ SENTINEL NODE BIOPSY Left 08/25/2022   Procedure: LEFT MASTECTOMY WITH SENTINEL LYMPH NODE BIOPSY;  Surgeon: Griselda Miner, MD;  Location: [facility];  Service: General;  Laterality: Left;   metal plate removed from head  2015   screw came loose   neck  fusion c1-c3  2010   OPERATIVE ULTRASOUND N/A 06/26/2020   Procedure: OPERATIVE ULTRASOUND;  Surgeon: Romualdo Bolk, MD;  Location: [facility];  Service: Gynecology;  Laterality: N/A;   SIMPLE MASTECTOMY WITH AXILLARY SENTINEL NODE BIOPSY Right 08/25/2022   Procedure: RIGHT SIMPLE MASTECTOMY;  Surgeon: Griselda Miner, MD;  Location: [facility];  Service: General;  Laterality: Right;   TONSILLECTOMY AND ADENOIDECTOMY  age 4   UPPER GI ENDOSCOPY  2012, 2018   XI ROBOTIC ASSISTED TOTAL HYSTERECTOMY WITH SACROCOLPOPEXY N/A 11/21/2021   Procedure: XI ROBOTIC ASSISTED TOTAL HYSTERECTOMY WITH BILATERAL SALPINGECTOMY  AND SACROCOLPOPEXY;  Surgeon: Marguerita Beards, MD;  Location: [facility];  Service: Gynecology;  Laterality: N/A;  TOTAL TIME REQUESTED IS 3.5HRS    SOCIAL HISTORY: Social History   Socioeconomic History   Marital status: Divorced    Spouse name: Not on file   Number of children: 1   Years of education: 16   Highest education level: Not on file  Occupational History    Employer: OTHER  Tobacco Use   Smoking status: Former    Current  packs/day: 0.00    Average packs/day: 2.0 packs/day for 21.0 years (42.0 ttl pk-yrs)    Types: Cigarettes    Start date: 09/09/1986    Quit date: 09/09/2007    Years since quitting: 15.0   Smokeless tobacco: Never  Vaping Use   Vaping status: Never Used  Substance and Sexual Activity   Alcohol use: No    Comment: h/o binge drinking none in 10 years   Drug use: Yes    Types: Marijuana    Comment: daily  use of marijuana recently   Sexual activity: Not Currently    Partners: Male    Birth control/protection: Other-see comments    Comment: Vasectomy-1st intercourse 57 yo-More than 5 partners  Other Topics Concern   Not on file  Social History Narrative   Patient is divorced and lives at home with her one child.   Patient is disabled.   Patient is right-handed.   Patient has a college education.   Patient drinks one cup of coffee daily.   Social Determinants of Health   Financial Resource Strain: Not on file  Food Insecurity: No Food Insecurity (07/18/2022)   Hunger Vital Sign    Worried About Running Out of Food in the Last Year: Never true    Ran Out of Food in the Last Year: Never true  Transportation Needs: No Transportation Needs (07/18/2022)   PRAPARE - Administrator, Civil Service (Medical): No    Lack of Transportation (Non-Medical): No  Physical Activity: Not on file  Stress: Not on file  Social Connections: Not on file  Intimate Partner Violence: Not At Risk (07/18/2022)   Humiliation, Afraid, Rape, and Kick questionnaire    Fear of Current or Ex-Partner: No    Emotionally Abused: No    Physically Abused: No    Sexually Abused: No    FAMILY HISTORY: Family History  Problem Relation Age of Onset   Aortic dissection Father    Cancer Father        GI; dx after 49   Melanoma Father        dx after 15   Breast cancer Sister 42       bilateral   Diabetes Maternal Aunt    Breast cancer Maternal Aunt        x3 mat aunts; dx after 71; one  w/ multiple  primaries   Uterine cancer Maternal Aunt        dx 57s   Diabetes Maternal Uncle    Breast cancer Paternal Aunt 36   Lung cancer Paternal Aunt    Pancreatic cancer Paternal Uncle 81   Diabetes Maternal Grandmother    Cancer Maternal Grandmother        skin and lung; dx after 50   Diabetes Paternal Grandmother    Breast cancer Other        MGF's mother; dx after 42   Rectal cancer Cousin        d. 29s   Brain cancer Cousin     ALLERGIES:  is allergic to advair hfa [fluticasone-salmeterol], other, prednisone, and morphine and codeine.  MEDICATIONS:  Current Outpatient Medications  Medication Sig Dispense Refill   acetaminophen (TYLENOL) 500 MG tablet Take 1 tablet (500 mg total) by mouth every 6 (six) hours as needed (pain). (Patient not taking: Reported on 09/17/2022) 30 tablet 0   acetaminophen (TYLENOL) 500 MG tablet Take 500 mg by mouth every 6 (six) hours as needed for moderate pain or fever.     albuterol (VENTOLIN HFA) 108 (90 Base) MCG/ACT inhaler Inhale 1-2 puffs into the lungs every 4 (four) hours as needed for wheezing or shortness of breath.     busPIRone (BUSPAR) 7.5 MG tablet Take 1 tablet (7.5 mg total) by mouth 2 (two) times daily. (Patient taking differently: Take 7.5 mg by mouth daily.) 60 tablet 5   cyclobenzaprine (FLEXERIL) 5 MG tablet Take 1 tablet (5 mg total) by mouth 3 (three) times daily as needed. 90 tablet 2   dexlansoprazole (DEXILANT) 60 MG capsule Take 60 mg by mouth at bedtime.     gabapentin (NEURONTIN) 100 MG capsule Take 1 capsule (100 mg total) by mouth at bedtime. (Patient taking differently: Take 100 mg by mouth at bedtime as needed (hot flashes).) 90 capsule 3   ibuprofen (ADVIL) 200 MG tablet Take 200 mg by mouth every 6 (six) hours as needed for fever or moderate pain.     ibuprofen (ADVIL) 600 MG tablet Take 1 tablet (600 mg total) by mouth every 6 (six) hours as needed. (Patient not taking: Reported on 09/17/2022) 30 tablet 0    ipratropium-albuterol (DUONEB) 0.5-2.5 (3) MG/3ML SOLN Take 3 mLs by nebulization every 6 (six) hours as needed (sob/wheezing).     lamoTRIgine (LAMICTAL) 25 MG tablet Take 1 tablet by mouth daily.     loratadine (CLARITIN) 10 MG tablet Take 10 mg by mouth daily.     Menthol, Topical Analgesic, (BIOFREEZE EX) Apply 1 Application topically daily as needed (pain).     metFORMIN (GLUCOPHAGE-XR) 500 MG 24 hr tablet Take 500 mg by mouth daily.     ondansetron (ZOFRAN) 8 MG tablet Take 8 mg by mouth every 8 (eight) hours as needed for nausea or vomiting.     oxyCODONE (ROXICODONE) 5 MG immediate release tablet Take 1 tablet (5 mg total) by mouth every 6 (six) hours as needed for severe pain. 15 tablet 0   Oxycodone HCl 10 MG TABS Take 1 tablet (10 mg total) by mouth 4 (four) times daily as needed for pain 120 tablet 0   [START ON 09/24/2022] Oxycodone HCl 10 MG TABS Take 1 tablet (10 mg total) by mouth 4 (four) times daily as needed. For pain 120 tablet 0   [START ON 10/23/2022] Oxycodone HCl 10 MG TABS Take 1 tablet (10 mg total) by mouth  4 (four) times daily as needed. For pain *10/23/22* 120 tablet 0   polyethylene glycol powder (GLYCOLAX/MIRALAX) 17 GM/SCOOP powder Take 17 g by mouth daily. Drink 17g (1 scoop) dissolved in water per day. (Patient taking differently: Take 17 g by mouth daily as needed for moderate constipation. Drink 17g (1 scoop) dissolved in water) 255 g 0   promethazine (PHENERGAN) 12.5 MG tablet Take 1 tablet (12.5 mg total) by mouth 3 (three) times daily as needed. 20 tablet 1   rizatriptan (MAXALT-MLT) 10 MG disintegrating tablet Take 10 mg by mouth as needed for migraine.     sertraline (ZOLOFT) 25 MG tablet Take 1 tablet (25 mg total) by mouth daily. 90 tablet 2   SYNTHROID 137 MCG tablet Take 137 mcg by mouth every morning.     UNABLE TO FIND Take 1-2 tablets by mouth 2 (two) times daily as needed (sleep/pain). Cbd gummies     valACYclovir (VALTREX) 1000 MG tablet TAKE 2 TABS (2  GRAMS) BY MOUTH NOW AND REPEAT IN 12 HOURS X 1, AS NEEDED FOR COLDSORE (Patient taking differently: Take 1,000-2,000 mg by mouth daily as needed (cold sores). TAKE 2 TABS (2 GRAMS) BY MOUTH NOW AND REPEAT IN 12 HOURS X 1, AS NEEDED FOR COLDSORE) 30 tablet 1   No current facility-administered medications for this visit.     PHYSICAL EXAMINATION: ECOG PERFORMANCE STATUS: 0 - Asymptomatic  There were no vitals filed for this visit.  There were no vitals filed for this visit.   Physical exam deferred in lieu of counseling  LABORATORY DATA:  I have reviewed the data as listed Lab Results  Component Value Date   WBC 11.0 (H) 09/16/2022   HGB 10.4 (L) 09/16/2022   HCT 32.1 (L) 09/16/2022   MCV 88.9 09/16/2022   PLT 201 09/16/2022   Lab Results  Component Value Date   NA 134 (L) 09/16/2022   K 4.1 09/16/2022   CL 102 09/16/2022   CO2 24 09/16/2022    RADIOGRAPHIC STUDIES: I have personally reviewed the radiological reports and agreed with the findings in the report.  ASSESSMENT AND PLAN:   Primary malignant neoplasm of upper outer quadrant of female breast, left (HCC) This is a very pleasant 57 year old postmenopausal female patient with newly diagnosed left breast invasive ductal carcinoma, grade 1, ER/PR positive, HER2 1+, Ki-67 of 5% referred to medical oncology for additional recommendations.  Given strong family history of breast cancer, patient is electing to proceed with bilateral mastectomies, no reconstruction given history of Ehlers-Danlos syndrome. Given small tumor, strong ER/PR positivity and low proliferation index, she went with surgery first.  Final pathology showed 8 mm grade 1 tumor in the left breast, negative margins, no evidence of lymph node involvement.  No evidence of malignancy in the right breast.  She is now status post bilateral mastectomy, no indication for adjuvant chemo or adjuvant radiation.  Oncotype DX of 10.  We have once again today discussed about  the role of antiestrogen therapy.  Since she had a history of cerebral venous sinus thrombus, she may have to consider ovarian suppression with aromatase inhibitors if she is premenopausal or aromatase inhibitors only if she is postmenopausal.  She is very reluctant to consider antiestrogen therapy.  We have discussed the benefits of antiestrogen therapy which is to reduce the risk of recurrence both locally and systemically.  She understands if the breast cancer were to recur outside the breast, it becomes an advanced breast cancer.  She  would like to proceed with the labs for menopausal status today.  She wants [place] to call her in a few weeks to see if she has changed her mind about antiestrogen therapy.   Total time spent: 30 min All questions were answered. The patient knows to call the clinic with any problems, questions or concerns.    Rachel Moulds, MD 09/18/22

## 2022-09-19 LAB — LUTEINIZING HORMONE: LH: 30 m[IU]/mL

## 2022-09-19 LAB — ESTRADIOL: Estradiol: 7.8 pg/mL

## 2022-09-19 LAB — FOLLICLE STIMULATING HORMONE: FSH: 48.9 m[IU]/mL

## 2022-09-21 ENCOUNTER — Encounter: Payer: Self-pay | Admitting: Genetic Counselor

## 2022-09-21 DIAGNOSIS — Z1589 Genetic susceptibility to other disease: Secondary | ICD-10-CM | POA: Insufficient documentation

## 2022-09-21 NOTE — Progress Notes (Signed)
HPI:   April Meyer was previously seen in the [facility] due to a personal history of breast cancer and concerns regarding a hereditary predisposition to cancer.    April Meyer recent genetic test results were disclosed to her by telephone. These results and recommendations are discussed in more detail below.  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  Report date is 07/28/2022.   The Multi-Cancer + RNA Panel offered by [facility] includes sequencing and/or deletion/duplication analysis of the following 70 genes:  AIP*, ALK, APC*, ATM*, AXIN2*, BAP1*, BARD1*, BLM*, BMPR1A*, BRCA1*, BRCA2*, BRIP1*, CDC73*, CDH1*, CDK4, CDKN1B*, CDKN2A, CHEK2*, CTNNA1*, DICER1*, EPCAM (del/dup only), EGFR, FH*, FLCN*, GREM1 (promoter dup only), HOXB13, KIT, LZTR1, MAX*, MBD4, MEN1*, MET, MITF, MLH1*, MSH2*, MSH3*, MSH6*, MUTYH*, NF1*, NF2*, NTHL1*, PALB2*, PDGFRA, PMS2*, POLD1*, POLE*, POT1*, PRKAR1A*, PTCH1*, PTEN*, RAD51C*, RAD51D*, RB1*, RET, SDHA* (sequencing only), SDHAF2*, SDHB*, SDHC*, SDHD*, SMAD4*, SMARCA4*, SMARCB1*, SMARCE1*, STK11*, SUFU*, TMEM127*, TP53*, TSC1*, TSC2*, VHL*. RNA analysis is performed for * genes.      FAMILY HISTORY:  We obtained a detailed, 4-generation family history.  Significant diagnoses are listed below:      Family History  Problem Relation Age of Onset   Aortic dissection  Father     Cancer Father          GI; dx after 39   Melanoma Father          dx after 60   Breast cancer Sister 94        bilateral   Breast cancer Maternal Aunt          x3 mat aunts; dx after 71; one w/ multiple primaries   Uterine cancer Maternal Aunt          dx 74s   Breast cancer Paternal Aunt 56   Lung cancer Paternal Aunt     Pancreatic cancer Paternal Uncle 46   Diabetes Maternal Grandmother     Cancer Maternal Grandmother          skin and lung; dx after 50   Breast cancer Other          MGF's mother; dx after 16   Rectal cancer Cousin          d. 55s   Brain cancer Cousin         Ms. Marbry is unaware of previous family history of genetic testing for hereditary cancer risks.  There is no reported Ashkenazi Jewish ancestry. There is no known consanguinity  GENETIC TEST RESULTS:  The [facility] Multi-Cancer +RNA Panel detected a single pathogenic variant in LZTR1 at c.1084C>T ([id number]*).    No other pathogenic variants were detected.  The Multi-Cancer + RNA Panel offered by [facility] includes sequencing and/or deletion/duplication analysis of the following 70 genes:  AIP*, ALK, APC*, ATM*, AXIN2*, BAP1*, BARD1*, BLM*, BMPR1A*, BRCA1*, BRCA2*, BRIP1*, CDC73*, CDH1*, CDK4, CDKN1B*, CDKN2A, CHEK2*, CTNNA1*, DICER1*, EPCAM (del/dup only), EGFR, FH*,  FLCN*, GREM1 (promoter dup only), HOXB13, KIT, LZTR1, MAX*, MBD4, MEN1*, MET, MITF, MLH1*, MSH2*, MSH3*, MSH6*, MUTYH*, NF1*, NF2*, NTHL1*, PALB2*, PDGFRA, PMS2*, POLD1*, POLE*, POT1*, PRKAR1A*, PTCH1*, PTEN*, RAD51C*, RAD51D*, RB1*, RET, SDHA* (sequencing only), SDHAF2*, SDHB*, SDHC*, SDHD*, SMAD4*, SMARCA4*, SMARCB1*, SMARCE1*, STK11*, SUFU*, TMEM127*, TP53*, TSC1*, TSC2*, VHL*. RNA analysis is performed for * genes.   The test report has been scanned into EPIC and is located under the Molecular Pathology section of the Results Review tab.  A portion of the result report is included below for reference. Genetic testing reported out on  Jul 28, 2022.       The pathogenic variant in LZTR1 does NOT explain her personal/family history of cancer. Possible explanations for the cancer in the family may include: There may be no hereditary risk for breast cancer in the family. The breast cancers in April Meyer and/or her family may be sporadic/familial or due to other genetic and environmental factors. There may be a gene mutation in one of these genes related to the personal or family history of cancer that current testing methods cannot detect but that chance is small. There could be another gene that has not yet been discovered, or that we have not yet tested, that is responsible for the cancer diagnoses in the family.  It is also possible there is a hereditary cause for the cancer in the family that April Meyer did not inherit.  Therefore, it is important to remain in touch with cancer genetics in the future so that we can continue to offer April Meyer the most up to date genetic testing.   LZTR1:   Individuals with one LZTR1 variant have an increased chance to develop schwannomatosis. Individuals with a variant on one or both copies of their LZTR1 gene also have an increased chance to develop Noonan spectrum disorder (NSD).   Schwannomatosis is associated with an increased chance to develop multiple tumors of the nerves (schwannomas) throughout the body (most often in the spine and peripheral nerves). These tumors are noncancerous (benign) but can cause pain and other symptoms due to their location and/or size. There are criteria to establish diagnosis of LZTR1-related schwannomatosis; a positive variant alone is not enough for a diagnosis.    Noonan spectrum disorders are a group of childhood developmental disorders that have common symptoms. These include cardiovascular conditions (such as a congenital heart defect or hypertrophic cardiomyopathy), short stature, distinctive facial features, areas of different pigmentation of the skin  (such as dark brown spots called lentigines and cafe au lait spots), and a variable degree of developmental delay. Individuals with NSD also have an increased risk of developing benign or cancerous tumors and hematologic abnormalities.    An individual with personal or family history of schwannomas, or pain/other symptoms of schwannoma, is suggestive of schwannomatosis. Ms. Sumption does not report any history/family history of schwannomas. Current guidelines do not recommend surveillance in LZTR1 positive individuals with no personal or family history suggestive of schwannomatosis. The risk of tumor development in these individuals is though to be low.    CANCER SCREENING RECOMMENDATIONS:  No changes in cancer screening are recommended based on the LZTR1 mutation at this time.  If she learns of a family history of schwannomas, screening for schwannomas may be indicated.   An individual's cancer risk and medical management are not determined by genetic test results alone. Overall cancer risk assessment incorporates additional factors, including personal medical history, family history, and any available genetic information that may result  in a personalized plan for cancer prevention and surveillance. Therefore, it is recommended she continue to follow the cancer management and screening guidelines provided by her oncology and primary healthcare provider.  Non-cancer related recommendations: Given her father's history of aortic dissection, Ms. Navia's should be offered an echo.  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  Given her history of EDS with a family history of aortic dissection as well her LZTR1 gene mutation and association with Noonan syndrome, we offered a medical genetics referral.  Ms. Bier was interested.  She stated that her father may also be interested as well. We agreed to contact her in August to discuss further.    RECOMMENDATIONS  FOR FAMILY MEMBERS:   First degree relatives of Ms. Vantassell have a 50% chance of having the same LZTR1 gene mutation.  More distant relatives of Ms. Snooks also have an increased chance having the LZTR1 gene mutation.  Genetic testing is available for those who wish to understand their LZTR1 status.  Individuals in this family might be at some increased risk of developing cancer, over the general population risk, due to the family history of cancer.  Individuals in the family should notify their providers of the family history of cancer. We recommend women in this family have a yearly mammogram beginning at age 51, or 63 years younger than the earliest onset of cancer, an annual clinical breast exam, and perform monthly breast self-exams.  Risk models that take into account family history and hormonal history may be helpful in determining appropriate breast cancer screening options for family members.  Other members of the family may still carry a pathogenic variant in one of these genes that Ms. Keetch did not inherit. Based on the family history, we recommend her sister, who was diagnosed with bilateral breast cancer, have genetic counseling and testing. Ms. Bona can let us know if we can be of any assistance in coordinating genetic counseling and/or testing for these family member.     FOLLOW-UP:  Cancer genetics is a rapidly advancing field and it is possible that new genetic tests will be appropriate for her and/or her family members in the future. We encourage Ms. Dadamo to remain in contact with cancer genetics, so we can update her personal and family histories and let her know of advances in cancer genetics that may benefit this family.   Our contact number was provided.  She knows she is welcome to call us at anytime with additional questions or concerns.   Safiyya Stokes M. Rennie Plowman, MS, [facility] Genetic Counselor [email or web address] (P) [phone]

## 2022-09-23 LAB — ANTI MULLERIAN HORMONE: ANTI-MULLERIAN HORMONE (AMH): <0.015 ng/mL

## 2022-09-26 ENCOUNTER — Other Ambulatory Visit: Payer: Self-pay | Admitting: *Deleted

## 2022-09-26 ENCOUNTER — Telehealth: Payer: Self-pay | Admitting: *Deleted

## 2022-09-26 MED ORDER — ANASTROZOLE 1 MG PO TABS: 1.0000 mg | ORAL_TABLET | Freq: Every day | ORAL | 3 refills | Status: DC

## 2022-09-26 NOTE — Telephone Encounter (Addendum)
-----   Message from Gardner Iruku sent at 09/22/2022  8:41 AM EDT ----- She appears to be in menopause based on labs. If she is willing to try anastrozole, ok to send a prescription.  This RN called pt and discussed- pharmacy verified.

## 2022-09-27 ENCOUNTER — Other Ambulatory Visit: Payer: Self-pay | Admitting: Hematology and Oncology

## 2022-09-29 ENCOUNTER — Other Ambulatory Visit (HOSPITAL_BASED_OUTPATIENT_CLINIC_OR_DEPARTMENT_OTHER): Payer: Self-pay

## 2022-09-29 ENCOUNTER — Other Ambulatory Visit: Payer: Self-pay

## 2022-10-02 ENCOUNTER — Encounter: Payer: Self-pay | Admitting: *Deleted

## 2022-10-02 ENCOUNTER — Encounter: Payer: Self-pay | Admitting: Hematology and Oncology

## 2022-10-07 ENCOUNTER — Ambulatory Visit (HOSPITAL_COMMUNITY): Payer: PPO | Admitting: Psychiatry

## 2022-10-13 NOTE — Therapy (Signed)
OUTPATIENT PHYSICAL THERAPY BREAST CANCER POST OP FOLLOW UP   Patient Name: April Meyer MRN: [id number] DOB:13-Dec-1965, 57 y.o., female Today's Date: 10/14/2022  END OF SESSION:  PT End of Session - 10/14/22 1353     Visit Number 2    Number of Visits 2    Date for PT Re-Evaluation 10/14/22    PT Start Time 1402    PT Stop Time 1500    PT Time Calculation (min) 58 min    Activity Tolerance Patient tolerated treatment well    Behavior During Therapy WFL for tasks assessed/performed             Past Medical History:  Diagnosis Date   ADHD (attention deficit hyperactivity disorder)    no meds for   Anxiety    Arnold-Chiari deformity (HCC)    Asthma    AVM (arteriovenous malformation) spine 04/21/2018   Breast cancer (HCC) 07/10/2022   Left   Bruises easily    Cerebral venous sinus thrombosis, remote, resolved 03/28/2013   Chronic pain    Common migraine with intractable migraine 04/21/2018   Complication of anesthesia age 76    breast lumpectomy heart rate went way down in hospital stayed 2 days, no problems since   Complication of anesthesia    C 1 to C 3 fusion side to side and up and down limited    EDS (Ehlers-Danlos syndrome)    Eustachian tube dysfunction    Fatigue    GERD (gastroesophageal reflux disease)    Glioma (HCC)    low graded tectal plate glioma   Hemorrhoids    History of sleep apnea    no cpap used last 2 years, normal sleep study 2 yrs ago   HSV infection    Hypothyroid    Hypothyroidism    IIH (idiopathic intracranial hypertension)    Lumbar disc herniation    Migraine    Neck stiffness    Neuromuscular disorder (HCC)    Ehlers-Danlos Syndrome   Nystagmus, positional, central type 03/28/2013   Other malaise and fatigue 03/28/2013   " The patient reports feeling happy, ED, sore" on she has undergone at Chiari malformation surgery decompression in 2011. Prior to the surgery were chief complaints were weakness numbness and neck problems. She  had swallowing difficulties and dizziness;  Classic Chiari presentation. But she didn't have headaches.   Photophobia    Pneumonia    Pre-diabetes    Tinnitus of both ears mainly in left ear    Urinary incontinence    Vision disturbance    Vitamin D deficiency    Weakness    Wears glasses    Past Surgical History:  Procedure Laterality Date   ARNOLD CHIARI REPAIR  2010   BLADDER SUSPENSION N/A 11/21/2021   Procedure: SINGLE INCISION SLING PROCEDURE (Altis Sling);  Surgeon: Marguerita Beards, MD;  Location: [facility];  Service: Gynecology;  Laterality: N/A;   Brain shunt  12/30/2019   vp shunt   BRAIN SURGERY     BREAST BIOPSY Left    lumpectomy age 32    BREAST BIOPSY Left 07/10/2022   [place] LT BREAST BX W LOC DEV 1ST LESION IMG BX SPEC US GUIDE 07/10/2022 [facility]   BREAST EXCISIONAL BIOPSY Right yrs ago   CHOLECYSTECTOMY  age 10   laparoscopic   colonscopy  2012, 2018   CYSTOSCOPY N/A 11/21/2021   Procedure: CYSTOSCOPY;  Surgeon: Marguerita Beards, MD;  Location: [facility];  Service: Gynecology;  Laterality: N/A;   DILATION AND CURETTAGE OF UTERUS  last done yrs ago   x 2 or 3   ENDOMETRIAL ABLATION  02/09/2013   [employer]   ESOPHAGOGASTRODUODENOSCOPY N/A 02/05/2021   Procedure: ESOPHAGOGASTRODUODENOSCOPY (EGD);  Surgeon: Jeani Hawking, MD;  Location: [facility];  Service: Endoscopy;  Laterality: N/A;   HYSTEROSCOPY WITH D & C N/A 06/26/2020   Procedure: DILATATION AND CURETTAGE /HYSTEROSCOPY;  Surgeon: Romualdo Bolk, MD;  Location: [facility];  Service: Gynecology;  Laterality: N/A;   INTRAUTERINE DEVICE INSERTION  10/2010   Mirena   IUD REMOVAL  11/2010   could not tolerate hormonal side effects   MASTECTOMY W/ SENTINEL NODE BIOPSY Left 08/25/2022   Procedure: LEFT MASTECTOMY WITH SENTINEL LYMPH NODE BIOPSY;  Surgeon: Griselda Miner, MD;  Location: [facility];  Service: General;  Laterality: Left;   metal plate removed from head   2015   screw came loose   neck fusion c1-c3  2010   OPERATIVE ULTRASOUND N/A 06/26/2020   Procedure: OPERATIVE ULTRASOUND;  Surgeon: Romualdo Bolk, MD;  Location: [facility];  Service: Gynecology;  Laterality: N/A;   SIMPLE MASTECTOMY WITH AXILLARY SENTINEL NODE BIOPSY Right 08/25/2022   Procedure: RIGHT SIMPLE MASTECTOMY;  Surgeon: Griselda Miner, MD;  Location: [facility];  Service: General;  Laterality: Right;   TONSILLECTOMY AND ADENOIDECTOMY  age 3   UPPER GI ENDOSCOPY  2012, 2018   XI ROBOTIC ASSISTED TOTAL HYSTERECTOMY WITH SACROCOLPOPEXY N/A 11/21/2021   Procedure: XI ROBOTIC ASSISTED TOTAL HYSTERECTOMY WITH BILATERAL SALPINGECTOMY  AND SACROCOLPOPEXY;  Surgeon: Marguerita Beards, MD;  Location: [facility];  Service: Gynecology;  Laterality: N/A;  TOTAL TIME REQUESTED IS 3.5HRS   Patient Active Problem List   Diagnosis Date Noted   Monoallelic mutation of LZTR1 gene 09/21/2022   Cancer of left female breast (HCC) 08/25/2022   Genetic testing 07/24/2022   Primary malignant neoplasm of upper outer quadrant of female breast, left (HCC) 07/16/2022   Malignant neoplasm of upper-inner quadrant of left breast in female, estrogen receptor positive (HCC) 07/15/2022   Venous insufficiency (chronic) (peripheral) 07/15/2021   Varicose veins of bilateral lower extremities with pain 06/10/2021   Narcotic overdose (HCC) 01/30/2021   DM2 (diabetes mellitus, type 2) (HCC) 01/30/2021   GERD (gastroesophageal reflux disease) 01/30/2021   Acute respiratory failure with hypoxia and hypercapnia (HCC) 01/30/2021   Raynaud's phenomenon 11/02/2020   Rotator cuff arthropathy of left shoulder 11/02/2020   Scoliosis 11/02/2020   Pain in joint involving pelvic region and thigh 11/02/2020   S/P VP shunt 06/19/2020   Attention deficit hyperactivity disorder 06/12/2020   Chronic low back pain 06/12/2020   Chronic obstructive pulmonary disease, unspecified (HCC) 06/12/2020    Compression of brain (HCC) 06/12/2020   Ehlers-Danlos syndrome, unspecified 06/12/2020   Prediabetes    Hypothyroid    IIH (idiopathic intracranial hypertension) 11/01/2018   AVM (arteriovenous malformation) spine 04/21/2018   Common migraine with intractable migraine 04/21/2018   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   Other malaise and fatigue 03/28/2013   History of Chiari malformation 03/28/2013   Hypersomnia, persistent 03/28/2013   Cerebral venous sinus thrombosis, remote, resolved 03/28/2013   Nystagmus, positional, central type 03/28/2013   Primary hypercoagulable state (HCC) 05/26/2012   Thrombosis, superior sagittal sinus 05/26/2012   Menorrhagia 07/31/2011   Anxiety 07/30/2011   History of tobacco use 07/30/2011   Obesity 04/15/2011   Obstructive sleep apnea 04/15/2011   Asthma 04/15/2011  Glioma of brain (HCC) 04/15/2011   Herniated lumbar intervertebral disc 04/15/2011    PCP: Nadyne Coombes, MD  REFERRING PROVIDER: Griselda Miner, MD  REFERRING DIAG: Left Breast Cancer  THERAPY DIAG:  Malignant neoplasm of upper-inner quadrant of left breast in female, estrogen receptor positive (HCC)  Abnormal posture  Ehlers-Danlos syndrome  Rationale for Evaluation and Treatment: Rehabilitation  ONSET DATE: 07/14/2022  SUBJECTIVE:                                                                                                                                                                                           SUBJECTIVE STATEMENT: My surgery went well. My incisions are itchy and feels tight. I am moving around a lot, but I have not been doing exercises. I fractured my tailbone the day after surgery by falling on the stairs. I feel like my ROM is doing well.  PERTINENT HISTORY:  Patient was diagnosed on 07/14/22 with left grade 1 IDC and DCIS. It measures 6 mm and is located in the upper-inner quadrant. It is ER+, PR+, Her 2- with a Ki67 of 5%. She has a strong  history of cancer in her family including a sister and 4 aunts. She had bilateral Mastectomies with Left SLNB  on 08/25/2022.  She had 65 cc of fluid removed from a seroma on the right on 10/07/22.She has Carylon Perches Danlos Syndrome and difficulty with left shoulder dislocations. Also has a leaking shunt that will require surgery.   PATIENT GOALS:  Reassess how my recovery is going related to arm function, pain, and swelling.  PAIN:  Are you having pain? Yes: NPRS scale: 4/10 Pain location: chest and armpit Pain description: itchy, achy, burning tightness Aggravating factors: sleeping wrong , weather because of shunt Relieving factors: pain meds, advil, tylenol, Oxycodone.  PRECAUTIONS: Recent Surgery, left UE Lymphedema risk,  Ehlers Danlos Syndrome: dislocation risk especially left shoulder, Arnold Chiari Malformation, AVM, ADHD,migraines, shunt that runs through inner aspect of right breast, Cervical Fusion C1,2,3 Presently having leakage from her shunt but can't get it fixed until after breast surgery  RED FLAGS: Bowel or bladder incontinence: Yes:      ACTIVITY LEVEL / LEISURE: swimming pool,   OBJECTIVE:   PATIENT SURVEYS:  QUICK DASH: 36%  OBSERVATIONS: Incisions healed nicely, dog ears noted bilaterally, mild lateral edema but pt wearing her compression bra  POSTURE:  Forward head rounded shoulders  LYMPHEDEMA ASSESSMENT:  UPPER EXTREMITY AROM/PROM: Caution due to Ehlers Danlos Syndrome   A/PROM RIGHT   eval   RIGHT 10/14/2022  Shoulder extension 62 62  Shoulder flexion 161 161  Shoulder abduction 174  167  Shoulder internal rotation 44 55  Shoulder external rotation 79 94                          (Blank rows = not tested)   A/PROM LEFT   eval LEFT 10/14/2022  Shoulder extension 55 53  Shoulder flexion 150 149  Shoulder abduction 146 145  Shoulder internal rotation 45 caution 45  Shoulder external rotation 89 caution to avoid dislocation 85                           (Blank rows = not tested)   CERVICAL AROM: Very restricted due to fusion (C1,2,3):    UPPER EXTREMITY STRENGTH: NT   LYMPHEDEMA ASSESSMENTS:    LANDMARK RIGHT   eval RIGHT  10 cm proximal to olecranon process 36.3 36.5  Olecranon process 30.6 31.0  10 cm proximal to ulnar styloid process 27.0 27.0  Just proximal to ulnar styloid process 18.6 118.9  Across hand at thumb web space 20.9 20.9  At base of 2nd digit 6.9 7.1  (Blank rows = not tested)   LANDMARK LEFT   eval LEFT 10/14/2022  10 cm proximal to olecranon process 36 36.3  Olecranon process 30.0 30.0  10 cm proximal to ulnar styloid process 26.7 26.0  Just proximal to ulnar styloid process 17.9 17.7  Across hand at thumb web space 20.0 19.7  At base of 2nd digit 7.0 6.8  (Blank rows = not tested)    Surgery type/Date: 08/25/2022 bilateral Mastectomies with left SLNB Number of lymph nodes removed: 0/2 Current/past treatment (chemo, radiation, hormone therapy): No chemo or radiation. Deciding on anti estrogens Other symptoms:  Heaviness/tightness Yes, chest, under arm Pain Yes Pitting edema No Infections No Decreased scar mobility Yes Stemmer sign No  PATIENT EDUCATION:  Education details: scar massage, ABC class, SOZO screens, continue to work on exercises and pec stretch given today,walking, lymphedema, wear sleeve with flying Person educated: Patient Education method: Explanation, Demonstration, and Handouts Education comprehension: verbalized understanding and returned demonstration  HOME EXERCISE PROGRAM: Reviewed previously given post op HEP. Advised to perform wall slide esp. On right for ABD ROM  ASSESSMENT:  CLINICAL IMPRESSION: Pt is s/p bilateral Mastectomies on 08/25/2022 with left SLNB.  Despite non-compliance with exercises she is doing exceptionally well. Her incisions are healing nicely with dog ears noted. ROM is nearly WNL bilaterally lacking only 7 degrees of abduction on the right with most  others WNL. She was educated to do the abd. Wall slide, and the pec stretch as instructed today. Foam pads were made with TG soft covers for lateral incisions bilaterally to place in compression bra to help with mild swelling. Pt reported they were very comfortable. Pt is planning on flying to [place] in Sept. so she was measured for a compression sleeve; Size 3 Max Juzo Dynamic which she ordered on [employer] and which is being sent to her house, She was educated to try wearing several hours a day to be sure it is the right fit and comfortable for her, before wearing  when flying. She was also advised to purchase a gauntlet if she notices any swelling in the back of her hand. Pt will continue to work on mild ROM limitations and there are no further needs for formal PT at this time.   Pt will benefit from skilled therapeutic intervention to improve on the following deficits: Decreased knowledge  of precautions, impaired UE functional use, pain, decreased ROM, postural dysfunction.   PT treatment/interventions: ADL/Self care home management, Therapeutic exercises, Patient/Family education, Self Care, Orthotic/Fit training, and scar mobilization   GOALS: Goals reviewed with patient? Yes  LONG TERM GOALS:  (STG=LTG)  GOALS Name Target Date  Goal status  1 Pt will demonstrate she has regained full shoulder ROM and function post operatively compared to baselines.  Baseline: 10/14/2022 Nearly MET, Lacks 7 degrees right abd      PLAN:  PT FREQUENCY/DURATION: no further PT required at this time  PLAN FOR NEXT SESSION: Pt advised to contact us with any questions or concerns. Doing very well after Bilateral Mastectomy. PHYSICAL THERAPY DISCHARGE SUMMARY  Visits from Start of Care: 2  Current functional level related to goals / functional outcomes: Pt doing very well;has nearly achieved full ROM bilaterally. Lacking slight right abd.   Remaining deficits: Mild ROM deficit   Education /  Equipment: Foam pads for bra, ordered compression sleeve from [employer] for fling to [place]   Patient agrees to discharge. Patient goals were partially met. Patient is being discharged due to meeting the stated rehab goals.   [facility]  [address]  [place] [address]  [phone]  After Breast Cancer Class It is recommended you attend the ABC class to be educated on lymphedema risk reduction. This class is free of charge and lasts for 1 hour. It is a 1-time class. You will need to download the TEAMS app either on your phone or computer. We will send you a link the night before or the morning of the class. You should be able to click on that link to join the class. This is not a confidential class. You don't have to turn your camera on, but other participants may be able to see your email address.  Scar massage You can begin gentle scar massage to you incision sites. Gently place one hand on the incision and move the skin (without sliding on the skin) in various directions. Do this for a few minutes and then you can gently massage either coconut oil or vitamin E cream into the scars.  Compression garment You should continue wearing your compression bra until you feel like you no longer have swelling.  Home exercise Program Continue doing the exercises you were given until you feel like you can do them without feeling any tightness at the end.   Walking Program Studies show that 30 minutes of walking per day (fast enough to elevate your heart rate) can significantly reduce the risk of a cancer recurrence. If you can't walk due to other medical reasons, we encourage you to find another activity you could do (like a stationary bike or water exercise).  Posture After breast cancer surgery, people frequently sit with rounded shoulders posture because it puts their incisions on slack and feels better. If you sit like this and scar tissue forms in  that position, you can become very tight and have pain sitting or standing with good posture. Try to be aware of your posture and sit and stand up tall to heal properly.  Follow up PT: It is recommended you return every 3 months for the first 3 years following surgery to be assessed on the SOZO machine for an L-Dex score. This helps prevent clinically significant lymphedema in 95% of patients. These follow up screens are 10 minute appointments that you are not billed for.  Waynette Buttery, PT  10/14/2022, 8:23 PM

## 2022-10-14 ENCOUNTER — Ambulatory Visit (INDEPENDENT_AMBULATORY_CARE_PROVIDER_SITE_OTHER): Payer: PPO | Admitting: Plastic Surgery

## 2022-10-14 ENCOUNTER — Ambulatory Visit: Payer: PPO | Attending: General Surgery

## 2022-10-14 ENCOUNTER — Encounter: Payer: Self-pay | Admitting: Plastic Surgery

## 2022-10-14 DIAGNOSIS — C50412 Malignant neoplasm of upper-outer quadrant of left female breast: Secondary | ICD-10-CM

## 2022-10-14 DIAGNOSIS — Q796 Ehlers-Danlos syndrome, unspecified: Secondary | ICD-10-CM | POA: Diagnosis present

## 2022-10-14 DIAGNOSIS — C50212 Malignant neoplasm of upper-inner quadrant of left female breast: Secondary | ICD-10-CM | POA: Diagnosis present

## 2022-10-14 DIAGNOSIS — Z170 Estrogen receptor positive status [ER+]: Secondary | ICD-10-CM

## 2022-10-14 DIAGNOSIS — R293 Abnormal posture: Secondary | ICD-10-CM | POA: Diagnosis present

## 2022-10-14 NOTE — Patient Instructions (Signed)
     [facility]  [address]  [place] [address]  [phone]  After Breast Cancer Class It is recommended you attend the ABC class to be educated on lymphedema risk reduction. This class is free of charge and lasts for 1 hour. It is a 1-time class. You will need to download the TEAMS app either on your phone or computer. We will send you a link the night before or the morning of the class. You should be able to click on that link to join the class. This is not a confidential class. You don't have to turn your camera on, but other participants may be able to see your email address.  Scar massage You can begin gentle scar massage to you incision sites. Gently place one hand on the incision and move the skin (without sliding on the skin) in various directions. Do this for a few minutes and then you can gently massage either coconut oil or vitamin E cream into the scars.  Compression garment You should continue wearing your compression bra until you feel like you no longer have swelling.  Home exercise Program Continue doing the exercises you were given until you feel like you can do them without feeling any tightness at the end.   Walking Program Studies show that 30 minutes of walking per day (fast enough to elevate your heart rate) can significantly reduce the risk of a cancer recurrence. If you can't walk due to other medical reasons, we encourage you to find another activity you could do (like a stationary bike or water exercise).  Posture After breast cancer surgery, people frequently sit with rounded shoulders posture because it puts their incisions on slack and feels better. If you sit like this and scar tissue forms in that position, you can become very tight and have pain sitting or standing with good posture. Try to be aware of your posture and sit and stand up tall to heal properly.  Follow up PT: It is recommended you return every 3 months for  the first 2 years following surgery to be assessed on the SOZO machine for an L-Dex score. This helps prevent clinically significant lymphedema in 95% of patients. These follow up screens are 10 minute appointments that you are not billed for.

## 2022-10-14 NOTE — Progress Notes (Signed)
   Subjective:    Patient ID: April Meyer, female    DOB: 02/23/1966, 57 y.o.   MRN: [id number]  The patient is a 57 year old female joining me by phone.  The patient underwent bilateral mastectomies on June 17.  She is recovering from that well.  Today she states she also has to have a brain surgery and that is likely going to happen in the next few weeks.  She does not have any questions or concerns about her breast at this time.  She stated she does have some dogears but she will address those later.      Review of Systems  Constitutional:  Positive for activity change.  Eyes: Negative.   Respiratory: Negative.    Cardiovascular: Negative.   Gastrointestinal: Negative.        Objective:   Physical Exam      Assessment & Plan:     ICD-10-CM   1. Primary malignant neoplasm of upper outer quadrant of female breast, left (HCC)  C50.412     2. Malignant neoplasm of upper-inner quadrant of left breast in female, estrogen receptor positive (HCC)  C50.212    Z17.0       Patient knows where we are if she needs anything she knows to reach out to [place].  I connected with  April Meyer on 10/14/22 by phone and verified that I am speaking with the correct person using two identifiers.  The patient was at home and I was at the office.  We spent 5 minutes in discussion.   I discussed the limitations of evaluation and management by telemedicine. The patient expressed understanding and agreed to proceed.

## 2022-10-27 ENCOUNTER — Inpatient Hospital Stay: Payer: PPO | Attending: Hematology and Oncology | Admitting: Hematology and Oncology

## 2022-10-27 DIAGNOSIS — C50412 Malignant neoplasm of upper-outer quadrant of left female breast: Secondary | ICD-10-CM | POA: Diagnosis not present

## 2022-10-27 MED ORDER — EXEMESTANE 25 MG PO TABS: 25.0000 mg | ORAL_TABLET | Freq: Every day | ORAL | 1 refills | Status: DC

## 2022-10-27 NOTE — Progress Notes (Unsigned)
[facility] CONSULT NOTE  Patient Care Team: Dois Davenport, MD as PCP - General (Family Medicine) Pershing Proud, RN as Oncology Nurse Navigator Donnelly Angelica, RN as Oncology Nurse Navigator Rachel Moulds, MD as Consulting Physician (Hematology and Oncology)  CHIEF COMPLAINTS/PURPOSE OF CONSULTATION:  Newly diagnosed breast cancer  HISTORY OF PRESENTING ILLNESS:  April Meyer 57 y.o. female is here because of recent diagnosis of left breast cancer.  I reviewed her records extensively and collaborated the history with the patient.  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  Report date is 07/28/2022.   The Multi-Cancer + RNA Panel offered by [facility] includes sequencing and/or deletion/duplication analysis of the following 70 genes:  AIP*, ALK, APC*, ATM*, AXIN2*, BAP1*, BARD1*, BLM*, BMPR1A*, BRCA1*, BRCA2*, BRIP1*, CDC73*, CDH1*, CDK4, CDKN1B*, CDKN2A, CHEK2*, CTNNA1*, DICER1*, EPCAM (del/dup only), EGFR, FH*, FLCN*, GREM1 (promoter dup only), HOXB13, KIT, LZTR1, MAX*, MBD4, MEN1*, MET, MITF, MLH1*, MSH2*, MSH3*, MSH6*, MUTYH*, NF1*, NF2*, NTHL1*, PALB2*, PDGFRA, PMS2*, POLD1*, POLE*, POT1*, PRKAR1A*, PTCH1*, PTEN*, RAD51C*, RAD51D*, RB1*, RET, SDHA* (sequencing only), SDHAF2*, SDHB*, SDHC*, SDHD*, SMAD4*, SMARCA4*, SMARCB1*, SMARCE1*, STK11*, SUFU*, TMEM127*, TP53*, TSC1*,  TSC2*, VHL*. RNA analysis is performed for * genes.    This is a very pleasant 57 year old perimenopausal female patient with past medical history significant for cerebral venous sinus thrombus, Arnold-Chiari deformity, ADHD, Ehlers-Danlos syndrome with screen detected left breast grade 1 IDC, ER/PR positive HER2 1+, low proliferation index referred to medical oncology.  She is contemplating bilateral mastectomy but since she has Erler's Danlos syndrome, she is not a good candidate for reconstruction.  She also had history of cerebral venous sinus thrombus, no DVT/PE.  Her last menstrual cycle before hysterectomy was about 5 months prior hence she may not be quite in menopause.  She had right breast biopsy many years ago which was benign.   She had bilateral mastectomy since her last visit, left mastectomy showed grade 1 invasive ductal carcinoma measuring 8 x 6 x 6 mm no residual DCIS, negative margins, prognostic markers showed ER/PR positive HER2 negative, Ki-67 of 5%, all lymph nodes negative for malignancy.  There is no invasive breast cancer in the right breast.  Oncotype Dx score of 10, she comes here today to discuss once again about antiestrogen therapy.  She tells me that she is not quite sure if she wants to do that.  Since she cannot take tamoxifen, she is worried that she may need to take ovarian suppression as well as antiestrogen therapy if she is indeed not postmenopausal.  She tells me that she may take her chances and just continue with surveillance but would like to know her menopausal status today.  She had fall, mostly mechanical but she is not entirely sure if she was a bit dizzy that day.  This happened the day after surgery.  She has some bruising but did not have any major injury otherwise.  Rest of the pertinent 10 point ROS reviewed and negative  MEDICAL HISTORY:  Past Medical History:  Diagnosis Date   ADHD (attention deficit hyperactivity disorder)    no meds for   Anxiety     Arnold-Chiari deformity (HCC)    Asthma    AVM (arteriovenous malformation) spine 04/21/2018   Breast cancer (HCC) 07/10/2022   Left   Bruises easily    Cerebral venous sinus thrombosis, remote, resolved 03/28/2013   Chronic pain    Common migraine with intractable migraine 04/21/2018   Complication of anesthesia age 56    breast lumpectomy heart rate went way down in hospital stayed 2 days, no problems since   Complication of anesthesia    C 1 to C 3 fusion side to side and up and down limited    EDS (Ehlers-Danlos syndrome)    Eustachian tube dysfunction    Fatigue    GERD (gastroesophageal reflux disease)    Glioma (HCC)    low graded tectal plate glioma   Hemorrhoids    History of sleep apnea    no cpap used last 2 years, normal sleep study 2 yrs ago   HSV infection    Hypothyroid    Hypothyroidism    IIH (idiopathic intracranial hypertension)    Lumbar disc herniation    Migraine    Neck stiffness    Neuromuscular disorder (HCC)    Ehlers-Danlos Syndrome   Nystagmus, positional, central type 03/28/2013   Other malaise and fatigue 03/28/2013   " The patient reports feeling happy, ED, sore" on she has undergone at Chiari malformation surgery decompression in 2011. Prior to the surgery were chief complaints were weakness numbness and neck problems. She had swallowing difficulties and dizziness;  Classic Chiari presentation. But she didn't have headaches.   Photophobia    Pneumonia    Pre-diabetes    Tinnitus of both ears mainly in left ear    Urinary incontinence    Vision disturbance    Vitamin D deficiency    Weakness    Wears glasses     SURGICAL HISTORY: Past Surgical History:  Procedure Laterality Date   ARNOLD CHIARI REPAIR  2010   BLADDER SUSPENSION N/A 11/21/2021   Procedure: SINGLE INCISION SLING PROCEDURE (Altis Sling);  Surgeon: Marguerita Beards, MD;  Location: [facility];  Service: Gynecology;  Laterality: N/A;   Brain shunt  12/30/2019   vp shunt    BRAIN SURGERY     BREAST BIOPSY Left    lumpectomy age 69    BREAST BIOPSY Left 07/10/2022   [place] LT BREAST BX W LOC DEV 1ST LESION IMG BX SPEC US GUIDE 07/10/2022 [facility]   BREAST EXCISIONAL BIOPSY Right yrs ago   CHOLECYSTECTOMY  age 6   laparoscopic   colonscopy  2012, 2018   CYSTOSCOPY N/A 11/21/2021   Procedure: CYSTOSCOPY;  Surgeon: Marguerita Beards, MD;  Location: [facility];  Service: Gynecology;  Laterality: N/A;   DILATION AND CURETTAGE OF UTERUS  last done yrs ago   x 2 or 3   ENDOMETRIAL ABLATION  02/09/2013   [facility]   ESOPHAGOGASTRODUODENOSCOPY N/A 02/05/2021   Procedure: ESOPHAGOGASTRODUODENOSCOPY (EGD);  Surgeon: Jeani Hawking, MD;  Location: [facility];  Service: Endoscopy;  Laterality: N/A;   HYSTEROSCOPY WITH D & C N/A 06/26/2020   Procedure: DILATATION AND CURETTAGE /HYSTEROSCOPY;  Surgeon: Romualdo Bolk, MD;  Location: [facility];  Service: Gynecology;  Laterality: N/A;   INTRAUTERINE DEVICE INSERTION  10/2010  Mirena   IUD REMOVAL  11/2010   could not tolerate hormonal side effects   MASTECTOMY W/ SENTINEL NODE BIOPSY Left 08/25/2022   Procedure: LEFT MASTECTOMY WITH SENTINEL LYMPH NODE BIOPSY;  Surgeon: Griselda Miner, MD;  Location: [facility];  Service: General;  Laterality: Left;   metal plate removed from head  2015   screw came loose   neck fusion c1-c3  2010   OPERATIVE ULTRASOUND N/A 06/26/2020   Procedure: OPERATIVE ULTRASOUND;  Surgeon: Romualdo Bolk, MD;  Location: [facility];  Service: Gynecology;  Laterality: N/A;   SIMPLE MASTECTOMY WITH AXILLARY SENTINEL NODE BIOPSY Right 08/25/2022   Procedure: RIGHT SIMPLE MASTECTOMY;  Surgeon: Griselda Miner, MD;  Location: [facility];  Service: General;  Laterality: Right;   TONSILLECTOMY AND ADENOIDECTOMY  age 12   UPPER GI ENDOSCOPY  2012, 2018   XI ROBOTIC ASSISTED TOTAL HYSTERECTOMY WITH SACROCOLPOPEXY N/A 11/21/2021    Procedure: XI ROBOTIC ASSISTED TOTAL HYSTERECTOMY WITH BILATERAL SALPINGECTOMY  AND SACROCOLPOPEXY;  Surgeon: Marguerita Beards, MD;  Location: [facility];  Service: Gynecology;  Laterality: N/A;  TOTAL TIME REQUESTED IS 3.5HRS    SOCIAL HISTORY: Social History   Socioeconomic History   Marital status: Divorced    Spouse name: Not on file   Number of children: 1   Years of education: 16   Highest education level: Not on file  Occupational History    Employer: OTHER  Tobacco Use   Smoking status: Former    Current packs/day: 0.00    Average packs/day: 2.0 packs/day for 21.0 years (42.0 ttl pk-yrs)    Types: Cigarettes    Start date: 09/09/1986    Quit date: 09/09/2007    Years since quitting: 15.1   Smokeless tobacco: Never  Vaping Use   Vaping status: Never Used  Substance and Sexual Activity   Alcohol use: No    Comment: h/o binge drinking none in 10 years   Drug use: Yes    Types: Marijuana    Comment: daily  use of marijuana recently   Sexual activity: Not Currently    Partners: Male    Birth control/protection: Other-see comments    Comment: Vasectomy-1st intercourse 57 yo-More than 5 partners  Other Topics Concern   Not on file  Social History Narrative   Patient is divorced and lives at home with her one child.   Patient is disabled.   Patient is right-handed.   Patient has a college education.   Patient drinks one cup of coffee daily.   Social Determinants of Health   Financial Resource Strain: Not on file  Food Insecurity: No Food Insecurity (07/18/2022)   Hunger Vital Sign    Worried About Running Out of Food in the Last Year: Never true    Ran Out of Food in the Last Year: Never true  Transportation Needs: No Transportation Needs (07/18/2022)   PRAPARE - Administrator, Civil Service (Medical): No    Lack of Transportation (Non-Medical): No  Physical Activity: Not on file  Stress: Not on file  Social Connections: Not on file  Intimate Partner  Violence: Not At Risk (07/18/2022)   Humiliation, Afraid, Rape, and Kick questionnaire    Fear of Current or Ex-Partner: No    Emotionally Abused: No    Physically Abused: No    Sexually Abused: No    FAMILY HISTORY: Family History  Problem Relation Age of Onset   Aortic dissection  Father    Cancer Father        GI; dx after 38   Melanoma Father        dx after 78   Breast cancer Sister 15       bilateral   Diabetes Maternal Aunt    Breast cancer Maternal Aunt        x3 mat aunts; dx after 80; one w/ multiple primaries   Uterine cancer Maternal Aunt        dx 48s   Diabetes Maternal Uncle    Breast cancer Paternal Aunt 90   Lung cancer Paternal Aunt    Pancreatic cancer Paternal Uncle 23   Diabetes Maternal Grandmother    Cancer Maternal Grandmother        skin and lung; dx after 50   Diabetes Paternal Grandmother    Breast cancer Other        MGF's mother; dx after 53   Rectal cancer Cousin        d. 40s   Brain cancer Cousin     ALLERGIES:  is allergic to advair hfa [fluticasone-salmeterol], other, prednisone, and morphine and codeine.  MEDICATIONS:  Current Outpatient Medications  Medication Sig Dispense Refill   acetaminophen (TYLENOL) 500 MG tablet Take 1 tablet (500 mg total) by mouth every 6 (six) hours as needed (pain). (Patient not taking: Reported on 09/17/2022) 30 tablet 0   acetaminophen (TYLENOL) 500 MG tablet Take 500 mg by mouth every 6 (six) hours as needed for moderate pain or fever.     albuterol (VENTOLIN HFA) 108 (90 Base) MCG/ACT inhaler Inhale 1-2 puffs into the lungs every 4 (four) hours as needed for wheezing or shortness of breath.     anastrozole (ARIMIDEX) 1 MG tablet TAKE 1 TABLET BY MOUTH EVERY DAY 90 tablet 1   busPIRone (BUSPAR) 7.5 MG tablet Take 1 tablet (7.5 mg total) by mouth 2 (two) times daily. (Patient taking differently: Take 7.5 mg by mouth daily.) 60 tablet 5   cyclobenzaprine (FLEXERIL) 5 MG tablet Take 1 tablet (5 mg total) by  mouth 3 (three) times daily as needed. 90 tablet 2   dexlansoprazole (DEXILANT) 60 MG capsule Take 60 mg by mouth at bedtime.     gabapentin (NEURONTIN) 100 MG capsule Take 1 capsule (100 mg total) by mouth at bedtime. (Patient taking differently: Take 100 mg by mouth at bedtime as needed (hot flashes).) 90 capsule 3   ibuprofen (ADVIL) 200 MG tablet Take 200 mg by mouth every 6 (six) hours as needed for fever or moderate pain.     ibuprofen (ADVIL) 600 MG tablet Take 1 tablet (600 mg total) by mouth every 6 (six) hours as needed. (Patient not taking: Reported on 09/17/2022) 30 tablet 0   ipratropium-albuterol (DUONEB) 0.5-2.5 (3) MG/3ML SOLN Take 3 mLs by nebulization every 6 (six) hours as needed (sob/wheezing).     lamoTRIgine (LAMICTAL) 25 MG tablet Take 1 tablet by mouth daily.     loratadine (CLARITIN) 10 MG tablet Take 10 mg by mouth daily.     Menthol, Topical Analgesic, (BIOFREEZE EX) Apply 1 Application topically daily as needed (pain).     metFORMIN (GLUCOPHAGE-XR) 500 MG 24 hr tablet Take 500 mg by mouth daily.     ondansetron (ZOFRAN) 8 MG tablet Take 8 mg by mouth every 8 (eight) hours as needed for nausea or vomiting.     oxyCODONE (ROXICODONE) 5 MG immediate release tablet Take 1 tablet (5 mg total) by  mouth every 6 (six) hours as needed for severe pain. 15 tablet 0   Oxycodone HCl 10 MG TABS Take 1 tablet (10 mg total) by mouth 4 (four) times daily as needed for pain 120 tablet 0   Oxycodone HCl 10 MG TABS Take 1 tablet (10 mg total) by mouth 4 (four) times daily as needed. For pain 120 tablet 0   Oxycodone HCl 10 MG TABS Take 1 tablet (10 mg total) by mouth 4 (four) times daily as needed. For pain *10/23/22* 120 tablet 0   polyethylene glycol powder (GLYCOLAX/MIRALAX) 17 GM/SCOOP powder Take 17 g by mouth daily. Drink 17g (1 scoop) dissolved in water per day. (Patient taking differently: Take 17 g by mouth daily as needed for moderate constipation. Drink 17g (1 scoop) dissolved in  water) 255 g 0   promethazine (PHENERGAN) 12.5 MG tablet Take 1 tablet (12.5 mg total) by mouth 3 (three) times daily as needed. 20 tablet 1   rizatriptan (MAXALT-MLT) 10 MG disintegrating tablet Take 10 mg by mouth as needed for migraine.     sertraline (ZOLOFT) 25 MG tablet Take 1 tablet (25 mg total) by mouth daily. 90 tablet 2   SYNTHROID 137 MCG tablet Take 137 mcg by mouth every morning.     UNABLE TO FIND Take 1-2 tablets by mouth 2 (two) times daily as needed (sleep/pain). Cbd gummies     valACYclovir (VALTREX) 1000 MG tablet TAKE 2 TABS (2 GRAMS) BY MOUTH NOW AND REPEAT IN 12 HOURS X 1, AS NEEDED FOR COLDSORE (Patient taking differently: Take 1,000-2,000 mg by mouth daily as needed (cold sores). TAKE 2 TABS (2 GRAMS) BY MOUTH NOW AND REPEAT IN 12 HOURS X 1, AS NEEDED FOR COLDSORE) 30 tablet 1   No current facility-administered medications for this visit.     PHYSICAL EXAMINATION: ECOG PERFORMANCE STATUS: 0 - Asymptomatic  There were no vitals filed for this visit.  There were no vitals filed for this visit.   Physical exam deferred in lieu of counseling  LABORATORY DATA:  I have reviewed the data as listed Lab Results  Component Value Date   WBC 11.0 (H) 09/16/2022   HGB 10.4 (L) 09/16/2022   HCT 32.1 (L) 09/16/2022   MCV 88.9 09/16/2022   PLT 201 09/16/2022   Lab Results  Component Value Date   NA 134 (L) 09/16/2022   K 4.1 09/16/2022   CL 102 09/16/2022   CO2 24 09/16/2022    RADIOGRAPHIC STUDIES: I have personally reviewed the radiological reports and agreed with the findings in the report.  ASSESSMENT AND PLAN:   No problem-specific Assessment & Plan notes found for this encounter.   Total time spent: 30 min All questions were answered. The patient knows to call the clinic with any problems, questions or concerns.    Rachel Moulds, MD 10/27/22

## 2022-10-27 NOTE — Assessment & Plan Note (Signed)
This is a very pleasant 57 year old postmenopausal female patient with newly diagnosed left breast invasive ductal carcinoma, grade 1, ER/PR positive, HER2 1+, Ki-67 of 5% referred to medical oncology for additional recommendations.  Given strong family history of breast cancer, patient is electing to proceed with bilateral mastectomies, no reconstruction given history of Ehlers-Danlos syndrome. Given small tumor, strong ER/PR positivity and low proliferation index, she went with surgery first.  Final pathology showed 8 mm grade 1 tumor in the left breast, negative margins, no evidence of lymph node involvement.  No evidence of malignancy in the right breast.  She is now status post bilateral mastectomy, no indication for adjuvant chemo or adjuvant radiation.  Oncotype DX of 10.  We have once again today discussed about the role of antiestrogen therapy.  Since she had a history of cerebral venous sinus thrombus, she may have to consider ovarian suppression with aromatase inhibitors if she is premenopausal or aromatase inhibitors only if she is postmenopausal.  She is very reluctant to consider antiestrogen therapy.  We have discussed the benefits of antiestrogen therapy which is to reduce the risk of recurrence both locally and systemically.  She understands if the breast cancer were to recur outside the breast, it becomes an advanced breast cancer.  She would like to proceed with the labs for menopausal status today.  She wants [place] to call her in a few weeks to see if she has changed her mind about antiestrogen therapy.

## 2022-10-28 ENCOUNTER — Other Ambulatory Visit (HOSPITAL_BASED_OUTPATIENT_CLINIC_OR_DEPARTMENT_OTHER): Payer: Self-pay

## 2022-11-13 ENCOUNTER — Inpatient Hospital Stay: Payer: PPO | Attending: Hematology and Oncology | Admitting: Hematology and Oncology

## 2022-11-13 ENCOUNTER — Encounter: Payer: Self-pay | Admitting: *Deleted

## 2022-11-13 DIAGNOSIS — C50412 Malignant neoplasm of upper-outer quadrant of left female breast: Secondary | ICD-10-CM

## 2022-11-13 NOTE — Assessment & Plan Note (Signed)
This is a very pleasant 57 year old postmenopausal female patient with newly diagnosed left breast invasive ductal carcinoma, grade 1, ER/PR positive, HER2 1+, Ki-67 of 5% referred to medical oncology for additional recommendations.  Given strong family history of breast cancer, patient is electing to proceed with bilateral mastectomies, no reconstruction given history of Ehlers-Danlos syndrome.  Given small tumor, strong ER/PR positivity and low proliferation index, she went with surgery first.  Final pathology showed 8 mm grade 1 tumor in the left breast, negative margins, no evidence of lymph node involvement.  She is status post bilateral mastectomy, no indication for adjuvant chemo or adjuvant radiation.  Oncotype DX of 10.  Given postmenopausal status, we have discussed about aromatase inhibitors.  She tried anastrozole but did not feel well.  She had chest pain while she was on anastrozole and almost was getting ready to go to the emergency room.  However when she stopped the medication, the chest pain has completely resolved.  She is now on anastrozole and is here for follow-up

## 2022-11-13 NOTE — Progress Notes (Signed)
[facility] CONSULT NOTE  Patient Care Team: Dois Davenport, MD as PCP - General (Family Medicine) Pershing Proud, RN as Oncology Nurse Navigator Donnelly Angelica, RN as Oncology Nurse Navigator Rachel Moulds, MD as Consulting Physician (Hematology and Oncology)  CHIEF COMPLAINTS/PURPOSE OF CONSULTATION:  Newly diagnosed breast cancer  HISTORY OF PRESENTING ILLNESS:  April Meyer 57 y.o. female is here because of recent diagnosis of left breast cancer.  I reviewed her records extensively and collaborated the history with the patient.  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  Report date is 07/28/2022.   The Multi-Cancer + RNA Panel offered by [facility] includes sequencing and/or deletion/duplication analysis of the following 70 genes:  AIP*, ALK, APC*, ATM*, AXIN2*, BAP1*, BARD1*, BLM*, BMPR1A*, BRCA1*, BRCA2*, BRIP1*, CDC73*, CDH1*, CDK4, CDKN1B*, CDKN2A, CHEK2*, CTNNA1*, DICER1*, EPCAM (del/dup only), EGFR, FH*, FLCN*, GREM1 (promoter dup only), HOXB13, KIT, LZTR1, MAX*, MBD4, MEN1*, MET, MITF, MLH1*, MSH2*, MSH3*, MSH6*, MUTYH*, NF1*, NF2*, NTHL1*, PALB2*, PDGFRA, PMS2*, POLD1*, POLE*, POT1*, PRKAR1A*, PTCH1*, PTEN*, RAD51C*, RAD51D*, RB1*, RET, SDHA* (sequencing only), SDHAF2*, SDHB*, SDHC*, SDHD*, SMAD4*, SMARCA4*, SMARCB1*, SMARCE1*, STK11*, SUFU*, TMEM127*, TP53*, TSC1*,  TSC2*, VHL*. RNA analysis is performed for * genes.    This is a very pleasant 57 year old perimenopausal female patient with past medical history significant for cerebral venous sinus thrombus, Arnold-Chiari deformity, ADHD, Ehlers-Danlos syndrome with screen detected left breast grade 1 IDC, ER/PR positive HER2 1+, low proliferation index referred to medical oncology.  She is contemplating bilateral mastectomy but since she has Erler's Danlos syndrome, she is not a good candidate for reconstruction.  She also had history of cerebral venous sinus thrombus, no DVT/PE.  Her last menstrual cycle before hysterectomy was about 5 months prior hence she may not be quite in menopause.  She had right breast biopsy many years ago which was benign.   She had bilateral mastectomy since her last visit, left mastectomy showed grade 1 invasive ductal carcinoma measuring 8 x 6 x 6 mm no residual DCIS, negative margins, prognostic markers showed ER/PR positive HER2 negative, Ki-67 of 5%, all lymph nodes negative for malignancy.  There is no invasive breast cancer in the right breast.  Oncotype Dx score of 10, no role for adjuvant chemotherapy.  Her labs showed that she is postmenopausal.  Hence we have discussed about considering aromatase inhibitors especially since she had history of cerebral venous sinus thrombus and she was very worried about taking tamoxifen.     Rest of the pertinent 10 point ROS reviewed and negative  MEDICAL HISTORY:  Past Medical History:  Diagnosis Date   ADHD (attention deficit hyperactivity disorder)    no meds for   Anxiety    Arnold-Chiari deformity (HCC)    Asthma    AVM (arteriovenous malformation) spine 04/21/2018   Breast cancer (HCC) 07/10/2022   Left   Bruises easily    Cerebral venous sinus thrombosis, remote, resolved  03/28/2013   Chronic pain    Common migraine with intractable migraine 04/21/2018   Complication of anesthesia age 51    breast lumpectomy heart rate  went way down in hospital stayed 2 days, no problems since   Complication of anesthesia    C 1 to C 3 fusion side to side and up and down limited    EDS (Ehlers-Danlos syndrome)    Eustachian tube dysfunction    Fatigue    GERD (gastroesophageal reflux disease)    Glioma (HCC)    low graded tectal plate glioma   Hemorrhoids    History of sleep apnea    no cpap used last 2 years, normal sleep study 2 yrs ago   HSV infection    Hypothyroid    Hypothyroidism    IIH (idiopathic intracranial hypertension)    Lumbar disc herniation    Migraine    Neck stiffness    Neuromuscular disorder (HCC)    Ehlers-Danlos Syndrome   Nystagmus, positional, central type 03/28/2013   Other malaise and fatigue 03/28/2013   " The patient reports feeling happy, ED, sore" on she has undergone at Chiari malformation surgery decompression in 2011. Prior to the surgery were chief complaints were weakness numbness and neck problems. She had swallowing difficulties and dizziness;  Classic Chiari presentation. But she didn't have headaches.   Photophobia    Pneumonia    Pre-diabetes    Tinnitus of both ears mainly in left ear    Urinary incontinence    Vision disturbance    Vitamin D deficiency    Weakness    Wears glasses     SURGICAL HISTORY: Past Surgical History:  Procedure Laterality Date   ARNOLD CHIARI REPAIR  2010   BLADDER SUSPENSION N/A 11/21/2021   Procedure: SINGLE INCISION SLING PROCEDURE (Altis Sling);  Surgeon: Marguerita Beards, MD;  Location: [facility];  Service: Gynecology;  Laterality: N/A;   Brain shunt  12/30/2019   vp shunt   BRAIN SURGERY     BREAST BIOPSY Left    lumpectomy age 28    BREAST BIOPSY Left 07/10/2022   [place] LT BREAST BX W LOC DEV 1ST LESION IMG BX SPEC US GUIDE 07/10/2022 [facility]   BREAST EXCISIONAL BIOPSY Right yrs ago   CHOLECYSTECTOMY  age 89   laparoscopic   colonscopy  2012, 2018   CYSTOSCOPY N/A 11/21/2021   Procedure: CYSTOSCOPY;  Surgeon:  Marguerita Beards, MD;  Location: [facility];  Service: Gynecology;  Laterality: N/A;   DILATION AND CURETTAGE OF UTERUS  last done yrs ago   x 2 or 3   ENDOMETRIAL ABLATION  02/09/2013   [facility]   ESOPHAGOGASTRODUODENOSCOPY N/A 02/05/2021   Procedure: ESOPHAGOGASTRODUODENOSCOPY (EGD);  Surgeon: Jeani Hawking, MD;  Location: [facility];  Service: Endoscopy;  Laterality: N/A;   HYSTEROSCOPY WITH D & C N/A 06/26/2020   Procedure: DILATATION AND CURETTAGE /HYSTEROSCOPY;  Surgeon: Romualdo Bolk, MD;  Location: [facility] [place];  Service: Gynecology;  Laterality: N/A;   INTRAUTERINE DEVICE INSERTION  10/2010   Mirena   IUD REMOVAL  11/2010   could not tolerate hormonal side effects   MASTECTOMY W/ SENTINEL NODE BIOPSY Left 08/25/2022   Procedure: LEFT MASTECTOMY WITH SENTINEL LYMPH NODE BIOPSY;  Surgeon: Griselda Miner, MD;  Location: [facility];  Service: General;  Laterality: Left;   metal plate removed from head  2015   screw came loose   neck fusion c1-c3  2010  OPERATIVE ULTRASOUND N/A 06/26/2020   Procedure: OPERATIVE ULTRASOUND;  Surgeon: Romualdo Bolk, MD;  Location: [facility];  Service: Gynecology;  Laterality: N/A;   SIMPLE MASTECTOMY WITH AXILLARY SENTINEL NODE BIOPSY Right 08/25/2022   Procedure: RIGHT SIMPLE MASTECTOMY;  Surgeon: Griselda Miner, MD;  Location: [facility];  Service: General;  Laterality: Right;   TONSILLECTOMY AND ADENOIDECTOMY  age 6   UPPER GI ENDOSCOPY  2012, 2018   XI ROBOTIC ASSISTED TOTAL HYSTERECTOMY WITH SACROCOLPOPEXY N/A 11/21/2021   Procedure: XI ROBOTIC ASSISTED TOTAL HYSTERECTOMY WITH BILATERAL SALPINGECTOMY  AND SACROCOLPOPEXY;  Surgeon: Marguerita Beards, MD;  Location: [facility];  Service: Gynecology;  Laterality: N/A;  TOTAL TIME REQUESTED IS 3.5HRS    SOCIAL HISTORY: Social History   Socioeconomic History   Marital status: Divorced    Spouse name: Not on file   Number of  children: 1   Years of education: 16   Highest education level: Not on file  Occupational History    Employer: OTHER  Tobacco Use   Smoking status: Former    Current packs/day: 0.00    Average packs/day: 2.0 packs/day for 21.0 years (42.0 ttl pk-yrs)    Types: Cigarettes    Start date: 09/09/1986    Quit date: 09/09/2007    Years since quitting: 15.1   Smokeless tobacco: Never  Vaping Use   Vaping status: Never Used  Substance and Sexual Activity   Alcohol use: No    Comment: h/o binge drinking none in 10 years   Drug use: Yes    Types: Marijuana    Comment: daily  use of marijuana recently   Sexual activity: Not Currently    Partners: Male    Birth control/protection: Other-see comments    Comment: Vasectomy-1st intercourse 57 yo-More than 5 partners  Other Topics Concern   Not on file  Social History Narrative   Patient is divorced and lives at home with her one child.   Patient is disabled.   Patient is right-handed.   Patient has a college education.   Patient drinks one cup of coffee daily.   Social Determinants of Health   Financial Resource Strain: Not on file  Food Insecurity: No Food Insecurity (07/18/2022)   Hunger Vital Sign    Worried About Running Out of Food in the Last Year: Never true    Ran Out of Food in the Last Year: Never true  Transportation Needs: No Transportation Needs (07/18/2022)   PRAPARE - Administrator, Civil Service (Medical): No    Lack of Transportation (Non-Medical): No  Physical Activity: Not on file  Stress: Not on file  Social Connections: Not on file  Intimate Partner Violence: Not At Risk (07/18/2022)   Humiliation, Afraid, Rape, and Kick questionnaire    Fear of Current or Ex-Partner: No    Emotionally Abused: No    Physically Abused: No    Sexually Abused: No    FAMILY HISTORY: Family History  Problem Relation Age of Onset   Aortic dissection Father    Cancer Father        GI; dx after 78   Melanoma Father         dx after 24   Breast cancer Sister 19       bilateral   Diabetes Maternal Aunt    Breast cancer Maternal Aunt        x3 mat aunts; dx after 14; one w/ multiple primaries  Uterine cancer Maternal Aunt        dx 64s   Diabetes Maternal Uncle    Breast cancer Paternal Aunt 1   Lung cancer Paternal Aunt    Pancreatic cancer Paternal Uncle 59   Diabetes Maternal Grandmother    Cancer Maternal Grandmother        skin and lung; dx after 50   Diabetes Paternal Grandmother    Breast cancer Other        MGF's mother; dx after 28   Rectal cancer Cousin        d. 55s   Brain cancer Cousin     ALLERGIES:  is allergic to advair hfa [fluticasone-salmeterol], other, prednisone, and morphine and codeine.  MEDICATIONS:  Current Outpatient Medications  Medication Sig Dispense Refill   acetaminophen (TYLENOL) 500 MG tablet Take 1 tablet (500 mg total) by mouth every 6 (six) hours as needed (pain). (Patient not taking: Reported on 09/17/2022) 30 tablet 0   acetaminophen (TYLENOL) 500 MG tablet Take 500 mg by mouth every 6 (six) hours as needed for moderate pain or fever.     albuterol (VENTOLIN HFA) 108 (90 Base) MCG/ACT inhaler Inhale 1-2 puffs into the lungs every 4 (four) hours as needed for wheezing or shortness of breath.     busPIRone (BUSPAR) 7.5 MG tablet Take 1 tablet (7.5 mg total) by mouth 2 (two) times daily. (Patient taking differently: Take 7.5 mg by mouth daily.) 60 tablet 5   cyclobenzaprine (FLEXERIL) 5 MG tablet Take 1 tablet (5 mg total) by mouth 3 (three) times daily as needed. 90 tablet 2   dexlansoprazole (DEXILANT) 60 MG capsule Take 60 mg by mouth at bedtime.     exemestane (AROMASIN) 25 MG tablet Take 1 tablet (25 mg total) by mouth daily after breakfast. 30 tablet 1   gabapentin (NEURONTIN) 100 MG capsule Take 1 capsule (100 mg total) by mouth at bedtime. (Patient taking differently: Take 100 mg by mouth at bedtime as needed (hot flashes).) 90 capsule 3   ibuprofen  (ADVIL) 200 MG tablet Take 200 mg by mouth every 6 (six) hours as needed for fever or moderate pain.     ibuprofen (ADVIL) 600 MG tablet Take 1 tablet (600 mg total) by mouth every 6 (six) hours as needed. (Patient not taking: Reported on 09/17/2022) 30 tablet 0   ipratropium-albuterol (DUONEB) 0.5-2.5 (3) MG/3ML SOLN Take 3 mLs by nebulization every 6 (six) hours as needed (sob/wheezing).     lamoTRIgine (LAMICTAL) 25 MG tablet Take 1 tablet by mouth daily.     loratadine (CLARITIN) 10 MG tablet Take 10 mg by mouth daily.     Menthol, Topical Analgesic, (BIOFREEZE EX) Apply 1 Application topically daily as needed (pain).     metFORMIN (GLUCOPHAGE-XR) 500 MG 24 hr tablet Take 500 mg by mouth daily.     ondansetron (ZOFRAN) 8 MG tablet Take 8 mg by mouth every 8 (eight) hours as needed for nausea or vomiting.     oxyCODONE (ROXICODONE) 5 MG immediate release tablet Take 1 tablet (5 mg total) by mouth every 6 (six) hours as needed for severe pain. 15 tablet 0   Oxycodone HCl 10 MG TABS Take 1 tablet (10 mg total) by mouth 4 (four) times daily as needed for pain 120 tablet 0   Oxycodone HCl 10 MG TABS Take 1 tablet (10 mg total) by mouth 4 (four) times daily as needed. For pain 120 tablet 0   Oxycodone HCl 10 MG  TABS Take 1 tablet (10 mg total) by mouth 4 (four) times daily as needed. For pain *10/23/22* 120 tablet 0   polyethylene glycol powder (GLYCOLAX/MIRALAX) 17 GM/SCOOP powder Take 17 g by mouth daily. Drink 17g (1 scoop) dissolved in water per day. (Patient taking differently: Take 17 g by mouth daily as needed for moderate constipation. Drink 17g (1 scoop) dissolved in water) 255 g 0   promethazine (PHENERGAN) 12.5 MG tablet Take 1 tablet (12.5 mg total) by mouth 3 (three) times daily as needed. 20 tablet 1   rizatriptan (MAXALT-MLT) 10 MG disintegrating tablet Take 10 mg by mouth as needed for migraine.     sertraline (ZOLOFT) 25 MG tablet Take 1 tablet (25 mg total) by mouth daily. 90 tablet 2    SYNTHROID 137 MCG tablet Take 137 mcg by mouth every morning.     UNABLE TO FIND Take 1-2 tablets by mouth 2 (two) times daily as needed (sleep/pain). Cbd gummies     valACYclovir (VALTREX) 1000 MG tablet TAKE 2 TABS (2 GRAMS) BY MOUTH NOW AND REPEAT IN 12 HOURS X 1, AS NEEDED FOR COLDSORE (Patient taking differently: Take 1,000-2,000 mg by mouth daily as needed (cold sores). TAKE 2 TABS (2 GRAMS) BY MOUTH NOW AND REPEAT IN 12 HOURS X 1, AS NEEDED FOR COLDSORE) 30 tablet 1   No current facility-administered medications for this visit.     PHYSICAL EXAMINATION: ECOG PERFORMANCE STATUS: 0 - Asymptomatic  LMP 08/10/2020     Physical exam deferred, telephone visit LABORATORY DATA:  I have reviewed the data as listed Lab Results  Component Value Date   WBC 11.0 (H) 09/16/2022   HGB 10.4 (L) 09/16/2022   HCT 32.1 (L) 09/16/2022   MCV 88.9 09/16/2022   PLT 201 09/16/2022   Lab Results  Component Value Date   NA 134 (L) 09/16/2022   K 4.1 09/16/2022   CL 102 09/16/2022   CO2 24 09/16/2022    RADIOGRAPHIC STUDIES: I have personally reviewed the radiological reports and agreed with the findings in the report.  ASSESSMENT AND PLAN:   No problem-specific Assessment & Plan notes found for this encounter. No answer,    The patient knows to call the clinic with any problems, questions or concerns.    Rachel Moulds, MD 11/13/22

## 2022-11-17 ENCOUNTER — Ambulatory Visit: Payer: PPO | Attending: General Surgery

## 2022-11-17 DIAGNOSIS — Q796 Ehlers-Danlos syndrome, unspecified: Secondary | ICD-10-CM | POA: Insufficient documentation

## 2022-11-17 DIAGNOSIS — C50212 Malignant neoplasm of upper-inner quadrant of left female breast: Secondary | ICD-10-CM | POA: Insufficient documentation

## 2022-11-17 DIAGNOSIS — R293 Abnormal posture: Secondary | ICD-10-CM | POA: Insufficient documentation

## 2022-11-17 DIAGNOSIS — Z170 Estrogen receptor positive status [ER+]: Secondary | ICD-10-CM | POA: Insufficient documentation

## 2022-11-18 ENCOUNTER — Other Ambulatory Visit (HOSPITAL_BASED_OUTPATIENT_CLINIC_OR_DEPARTMENT_OTHER): Payer: Self-pay

## 2022-11-18 MED ORDER — CYCLOBENZAPRINE HCL 5 MG PO TABS
5.0000 mg | ORAL_TABLET | Freq: Three times a day (TID) | ORAL | 2 refills | Status: DC | PRN
  Filled 2022-11-18: qty 90, 30d supply, fill #0

## 2022-11-18 MED ORDER — OXYCODONE HCL 10 MG PO TABS
10.0000 mg | ORAL_TABLET | Freq: Four times a day (QID) | ORAL | 0 refills | Status: DC | PRN
  Filled 2022-11-27: qty 120, 30d supply, fill #0

## 2022-11-18 MED ORDER — OXYCODONE HCL 10 MG PO TABS
10.0000 mg | ORAL_TABLET | Freq: Four times a day (QID) | ORAL | 0 refills | Status: DC | PRN
  Filled 2022-12-25: qty 120, 30d supply, fill #0

## 2022-11-19 ENCOUNTER — Other Ambulatory Visit: Payer: Self-pay | Admitting: Hematology and Oncology

## 2022-11-19 MED ORDER — EXEMESTANE 25 MG PO TABS: 25.0000 mg | ORAL_TABLET | Freq: Every day | ORAL | 1 refills | Status: DC

## 2022-11-25 ENCOUNTER — Other Ambulatory Visit (HOSPITAL_BASED_OUTPATIENT_CLINIC_OR_DEPARTMENT_OTHER): Payer: Self-pay

## 2022-11-27 ENCOUNTER — Other Ambulatory Visit (HOSPITAL_BASED_OUTPATIENT_CLINIC_OR_DEPARTMENT_OTHER): Payer: Self-pay

## 2022-12-08 ENCOUNTER — Encounter: Payer: Self-pay | Admitting: *Deleted

## 2022-12-09 ENCOUNTER — Ambulatory Visit (HOSPITAL_BASED_OUTPATIENT_CLINIC_OR_DEPARTMENT_OTHER): Payer: PPO | Admitting: Psychiatry

## 2022-12-09 ENCOUNTER — Encounter: Payer: Self-pay | Admitting: *Deleted

## 2022-12-09 ENCOUNTER — Other Ambulatory Visit: Payer: Self-pay

## 2022-12-09 ENCOUNTER — Encounter (HOSPITAL_COMMUNITY): Payer: Self-pay | Admitting: Psychiatry

## 2022-12-09 VITALS — BP 128/80 | HR 80 | Ht 67.0 in | Wt 244.0 lb

## 2022-12-09 DIAGNOSIS — F325 Major depressive disorder, single episode, in full remission: Secondary | ICD-10-CM | POA: Diagnosis not present

## 2022-12-09 NOTE — Progress Notes (Signed)
   Yesterday this RN had talked to patient about Survivorship and inquired to see if she had started on the exemestane that was prescribed to her. Pt said no, she couldn't afford it. This nurse then asked her if we could get it cheaper for her, would she consider taking, pt said yes. Today Nurse Pollie Friar, Nurse Pickrell has found an alternative that would be affordable. This nurse called pt to discuss it with her. Pt voiced that she  has decided to take the risk but thanked [place] for looking into this matter for her. Dr. Al Pimple will be informed. She knows if she needs anything to call us.

## 2022-12-09 NOTE — Progress Notes (Signed)
Psychiatric Initial Adult Assessment   Patient Identification: April Meyer MRN:  [id number] Date of Evaluation:  12/09/2022 Referral Source: Chief Complaint:   Visit Diagnosis: Major depression  History of Present Illne  Today the patient is seen in the office.  Unfortunately because of other appointments for her she was not seen and she could not get her Zoloft represcribed.  She has been off of it now for a month or 2 but she notices no difference.  She feels fine without it.  This patient had a remarkable number of very serious medical problems.  This includes a benign brain tumor.  It includes a brain shunt because she has intracranial hypertension.  Includes aldose Chandler bone disease.  Patient has chronic headaches.  Amazingly the patient denies being depressed.  She denies anxiety.  She has a good relationship with her boyfriend of 5 years who she lives with.  Noted is 8 years ago her only son died of an overdose.  The patient is sleeping and eating well.  She got good energy.  She denies anhedonia.  She is not suicidal.  She feels very stable psychiatrically.  We wanted to see her for sure and that she is off Zoloft.  Today was a supportive psychotherapy session.  She reviewed all the medical problems she is having in the chronic headaches.  Presently she is in service to help her get treatment for her CSF leak from her brain.  Patient sees a number of specialists.  Associated Signs/Symptoms: Depression Symptoms:  depressed mood, (Hypo) Manic Symptoms:     Anxiety Symptoms:     Psychotic Symptoms:     PTSD Symptoms: Negative  Past Psychiatric History: Zoloft  Previous Psychotropic Medications: Yes   Substance Abuse History in the last 12 months:  Yes.    Consequences of Substance Abuse: NA  Past Medical History:  Past Medical History:  Diagnosis Date   ADHD (attention deficit hyperactivity disorder)    no meds for   Anxiety    Arnold-Chiari deformity (HCC)    Asthma     AVM (arteriovenous malformation) spine 04/21/2018   Breast cancer (HCC) 07/10/2022   Left   Bruises easily    Cerebral venous sinus thrombosis, remote, resolved 03/28/2013   Chronic pain    Common migraine with intractable migraine 04/21/2018   Complication of anesthesia age 57    breast lumpectomy heart rate went way down in hospital stayed 2 days, no problems since   Complication of anesthesia    C 1 to C 3 fusion side to side and up and down limited    EDS (Ehlers-Danlos syndrome)    Eustachian tube dysfunction    Fatigue    GERD (gastroesophageal reflux disease)    Glioma (HCC)    low graded tectal plate glioma   Hemorrhoids    History of sleep apnea    no cpap used last 2 years, normal sleep study 2 yrs ago   HSV infection    Hypothyroid    Hypothyroidism    IIH (idiopathic intracranial hypertension)    Lumbar disc herniation    Migraine    Neck stiffness    Neuromuscular disorder (HCC)    Ehlers-Danlos Syndrome   Nystagmus, positional, central type 03/28/2013   Other malaise and fatigue 03/28/2013   " The patient reports feeling happy, ED, sore" on she has undergone at Chiari malformation surgery decompression in 2011. Prior to the surgery were chief complaints were weakness numbness and neck problems. She  had swallowing difficulties and dizziness;  Classic Chiari presentation. But she didn't have headaches.   Photophobia    Pneumonia    Pre-diabetes    Tinnitus of both ears mainly in left ear    Urinary incontinence    Vision disturbance    Vitamin D deficiency    Weakness    Wears glasses     Past Surgical History:  Procedure Laterality Date   ARNOLD CHIARI REPAIR  2010   BLADDER SUSPENSION N/A 11/21/2021   Procedure: SINGLE INCISION SLING PROCEDURE (Altis Sling);  Surgeon: Marguerita Beards, MD;  Location: [facility];  Service: Gynecology;  Laterality: N/A;   Brain shunt  12/30/2019   vp shunt   BRAIN SURGERY     BREAST BIOPSY Left    lumpectomy age 25     BREAST BIOPSY Left 07/10/2022   [place] LT BREAST BX W LOC DEV 1ST LESION IMG BX SPEC US GUIDE 07/10/2022 [facility]   BREAST EXCISIONAL BIOPSY Right yrs ago   CHOLECYSTECTOMY  age 11   laparoscopic   colonscopy  2012, 2018   CYSTOSCOPY N/A 11/21/2021   Procedure: CYSTOSCOPY;  Surgeon: Marguerita Beards, MD;  Location: [facility];  Service: Gynecology;  Laterality: N/A;   DILATION AND CURETTAGE OF UTERUS  last done yrs ago   x 2 or 3   ENDOMETRIAL ABLATION  02/09/2013   [facility]   ESOPHAGOGASTRODUODENOSCOPY N/A 02/05/2021   Procedure: ESOPHAGOGASTRODUODENOSCOPY (EGD);  Surgeon: Jeani Hawking, MD;  Location: [facility];  Service: Endoscopy;  Laterality: N/A;   HYSTEROSCOPY WITH D & C N/A 06/26/2020   Procedure: DILATATION AND CURETTAGE /HYSTEROSCOPY;  Surgeon: Romualdo Bolk, MD;  Location: [facility];  Service: Gynecology;  Laterality: N/A;   INTRAUTERINE DEVICE INSERTION  10/2010   Mirena   IUD REMOVAL  11/2010   could not tolerate hormonal side effects   MASTECTOMY W/ SENTINEL NODE BIOPSY Left 08/25/2022   Procedure: LEFT MASTECTOMY WITH SENTINEL LYMPH NODE BIOPSY;  Surgeon: Griselda Miner, MD;  Location: [facility];  Service: General;  Laterality: Left;   metal plate removed from head  2015   screw came loose   neck fusion c1-c3  2010   OPERATIVE ULTRASOUND N/A 06/26/2020   Procedure: OPERATIVE ULTRASOUND;  Surgeon: Romualdo Bolk, MD;  Location: [facility];  Service: Gynecology;  Laterality: N/A;   SIMPLE MASTECTOMY WITH AXILLARY SENTINEL NODE BIOPSY Right 08/25/2022   Procedure: RIGHT SIMPLE MASTECTOMY;  Surgeon: Griselda Miner, MD;  Location: [facility];  Service: General;  Laterality: Right;   TONSILLECTOMY AND ADENOIDECTOMY  age 36   UPPER GI ENDOSCOPY  2012, 2018   XI ROBOTIC ASSISTED TOTAL HYSTERECTOMY WITH SACROCOLPOPEXY N/A 11/21/2021   Procedure: XI ROBOTIC ASSISTED TOTAL HYSTERECTOMY WITH BILATERAL  SALPINGECTOMY  AND SACROCOLPOPEXY;  Surgeon: Marguerita Beards, MD;  Location: [facility];  Service: Gynecology;  Laterality: N/A;  TOTAL TIME REQUESTED IS 3.5HRS    Family Psychiatric History:   Family History:  Family History  Problem Relation Age of Onset   Aortic dissection Father    Cancer Father        GI; dx after 15   Melanoma Father        dx after 43   Breast cancer Sister 97       bilateral   Diabetes Maternal Aunt    Breast cancer Maternal Aunt        x3 mat aunts; dx after 52;  one w/ multiple primaries   Uterine cancer Maternal Aunt        dx 68s   Diabetes Maternal Uncle    Breast cancer Paternal Aunt 13   Lung cancer Paternal Aunt    Pancreatic cancer Paternal Uncle 31   Diabetes Maternal Grandmother    Cancer Maternal Grandmother        skin and lung; dx after 50   Diabetes Paternal Grandmother    Breast cancer Other        MGF's mother; dx after 85   Rectal cancer Cousin        d. 91s   Brain cancer Cousin     Social History:   Social History   Socioeconomic History   Marital status: Divorced    Spouse name: Not on file   Number of children: 1   Years of education: 16   Highest education level: Not on file  Occupational History    Employer: OTHER  Tobacco Use   Smoking status: Former    Current packs/day: 0.00    Average packs/day: 2.0 packs/day for 21.0 years (42.0 ttl pk-yrs)    Types: Cigarettes    Start date: 09/09/1986    Quit date: 09/09/2007    Years since quitting: 15.2   Smokeless tobacco: Never  Vaping Use   Vaping status: Never Used  Substance and Sexual Activity   Alcohol use: No    Comment: h/o binge drinking none in 10 years   Drug use: Yes    Types: Marijuana    Comment: daily  use of marijuana recently   Sexual activity: Not Currently    Partners: Male    Birth control/protection: Other-see comments    Comment: Vasectomy-1st intercourse 57 yo-More than 5 partners  Other Topics Concern   Not on file  Social History  Narrative   Patient is divorced and lives at home with her one child.   Patient is disabled.   Patient is right-handed.   Patient has a college education.   Patient drinks one cup of coffee daily.   Social Determinants of Health   Financial Resource Strain: Not on file  Food Insecurity: No Food Insecurity (07/18/2022)   Hunger Vital Sign    Worried About Running Out of Food in the Last Year: Never true    Ran Out of Food in the Last Year: Never true  Transportation Needs: No Transportation Needs (07/18/2022)   PRAPARE - Administrator, Civil Service (Medical): No    Lack of Transportation (Non-Medical): No  Physical Activity: Not on file  Stress: Not on file  Social Connections: Not on file    Additional Social History:   Allergies:   Allergies  Allergen Reactions   Advair Hfa [Fluticasone-Salmeterol] Anaphylaxis   Other Anaphylaxis    Steroids and Zucchini    Prednisone Anaphylaxis   Morphine And Codeine Itching    Severe    Metabolic Disorder Labs: Lab Results  Component Value Date   HGBA1C 5.7 (H) 11/15/2021   MPG 116.89 11/15/2021   MPG 99.67 01/30/2021   No results found for: "PROLACTIN" No results found for: "CHOL", "TRIG", "HDL", "CHOLHDL", "VLDL", "LDLCALC" Lab Results  Component Value Date   TSH 16.268 (H) 01/30/2021    Therapeutic Level Labs: No results found for: "LITHIUM" No results found for: "CBMZ" No results found for: "VALPROATE"  Current Medications: Current Outpatient Medications  Medication Sig Dispense Refill   acetaminophen (TYLENOL) 500 MG tablet Take 1 tablet (500  mg total) by mouth every 6 (six) hours as needed (pain). 30 tablet 0   acetaminophen (TYLENOL) 500 MG tablet Take 500 mg by mouth every 6 (six) hours as needed for moderate pain or fever.     albuterol (VENTOLIN HFA) 108 (90 Base) MCG/ACT inhaler Inhale 1-2 puffs into the lungs every 4 (four) hours as needed for wheezing or shortness of breath.     cyclobenzaprine  (FLEXERIL) 5 MG tablet Take 1 tablet (5 mg total) by mouth 3 (three) times daily as needed. 90 tablet 2   cyclobenzaprine (FLEXERIL) 5 MG tablet Take 1 tablet (5 mg total) by mouth 3 (three) times daily as needed. 90 tablet 2   dexlansoprazole (DEXILANT) 60 MG capsule Take 60 mg by mouth at bedtime.     exemestane (AROMASIN) 25 MG tablet Take 1 tablet (25 mg total) by mouth daily after breakfast. 90 tablet 1   gabapentin (NEURONTIN) 100 MG capsule Take 1 capsule (100 mg total) by mouth at bedtime. (Patient taking differently: Take 100 mg by mouth at bedtime as needed (hot flashes).) 90 capsule 3   ibuprofen (ADVIL) 200 MG tablet Take 200 mg by mouth every 6 (six) hours as needed for fever or moderate pain.     ibuprofen (ADVIL) 600 MG tablet Take 1 tablet (600 mg total) by mouth every 6 (six) hours as needed. 30 tablet 0   ipratropium-albuterol (DUONEB) 0.5-2.5 (3) MG/3ML SOLN Take 3 mLs by nebulization every 6 (six) hours as needed (sob/wheezing).     lamoTRIgine (LAMICTAL) 25 MG tablet Take 1 tablet by mouth daily.     loratadine (CLARITIN) 10 MG tablet Take 10 mg by mouth daily.     Menthol, Topical Analgesic, (BIOFREEZE EX) Apply 1 Application topically daily as needed (pain).     ondansetron (ZOFRAN) 8 MG tablet Take 8 mg by mouth every 8 (eight) hours as needed for nausea or vomiting.     Oxycodone HCl 10 MG TABS Take 1 tablet (10 mg total) by mouth 4 (four) times daily as needed for pain 120 tablet 0   Oxycodone HCl 10 MG TABS Take 1 tablet (10 mg total) by mouth 4 (four) times daily as needed. For pain 120 tablet 0   Oxycodone HCl 10 MG TABS Take 1 tablet (10 mg total) by mouth 4 (four) times daily as needed. For pain *10/23/22* 120 tablet 0   Oxycodone HCl 10 MG TABS Take 1 tablet (10 mg total) by mouth 4 (four) times daily as needed for pain. 120 tablet 0   [START ON 12/24/2022] Oxycodone HCl 10 MG TABS Take 1 tablet (10 mg total) by mouth 4 (four) times daily as needed for pain. 120  tablet 0   polyethylene glycol powder (GLYCOLAX/MIRALAX) 17 GM/SCOOP powder Take 17 g by mouth daily. Drink 17g (1 scoop) dissolved in water per day. (Patient taking differently: Take 17 g by mouth daily as needed for moderate constipation. Drink 17g (1 scoop) dissolved in water) 255 g 0   promethazine (PHENERGAN) 12.5 MG tablet Take 1 tablet (12.5 mg total) by mouth 3 (three) times daily as needed. 20 tablet 1   rizatriptan (MAXALT-MLT) 10 MG disintegrating tablet Take 10 mg by mouth as needed for migraine.     sertraline (ZOLOFT) 25 MG tablet Take 1 tablet (25 mg total) by mouth daily. 90 tablet 2   SYNTHROID 137 MCG tablet Take 137 mcg by mouth every morning.     UNABLE TO FIND Take 1-2 tablets by  mouth 2 (two) times daily as needed (sleep/pain). Cbd gummies     valACYclovir (VALTREX) 1000 MG tablet TAKE 2 TABS (2 GRAMS) BY MOUTH NOW AND REPEAT IN 12 HOURS X 1, AS NEEDED FOR COLDSORE (Patient taking differently: Take 1,000-2,000 mg by mouth daily as needed (cold sores). TAKE 2 TABS (2 GRAMS) BY MOUTH NOW AND REPEAT IN 12 HOURS X 1, AS NEEDED FOR COLDSORE) 30 tablet 1   busPIRone (BUSPAR) 7.5 MG tablet Take 1 tablet (7.5 mg total) by mouth 2 (two) times daily. (Patient not taking: Reported on 12/09/2022) 60 tablet 5   metFORMIN (GLUCOPHAGE-XR) 500 MG 24 hr tablet Take 500 mg by mouth daily.     oxyCODONE (ROXICODONE) 5 MG immediate release tablet Take 1 tablet (5 mg total) by mouth every 6 (six) hours as needed for severe pain. 15 tablet 0   No current facility-administered medications for this visit.    Musculoskeletal: Strength & Muscle Tone: within normal limits Gait & Station: normal Patient leans: Right  Psychiatric Specialty Exam: Review of Systems  Blood pressure 128/80, pulse 80, height 5\' 7"  (1.702 m), weight 244 lb (110.7 kg), last menstrual period 08/10/2020.Body mass index is 38.22 kg/m.  General Appearance: Negative  Eye Contact:  Good  Speech:  Negative  Volume:  Normal   Mood:  Negative  Affect:  Congruent  Thought Process:  Goal Directed  Orientation:  Full (Time, Place, and Person)  Thought Content:  Logical  Suicidal Thoughts:  No  Homicidal Thoughts:  No  Memory:  NA  Judgement:  Good  Insight:  Good  Psychomotor Activity:  Normal  Concentration:  Concentration: Good  Recall:  Good  Fund of Knowledge:Good  Language: Good  Akathisia:  No  Handed:  Right  AIMS (if indicated):  not done  Assets:  Desire for Improvement  ADL's:  Intact  Cognition: WNL  Sleep:  Good   Screenings: PHQ2-9    Flowsheet Row Genetic Counseling from 07/17/2022 in [facility] Counselor from 02/18/2021 in [facility] Total Score 2 3  PHQ-9 Total Score -- 11      Flowsheet Row ED from 09/16/2022 in [facility] at [facility] Admission (Discharged) from 08/25/2022 in [facility] Pre-Admission Testing 60 from [facility]  C-SSRS RISK CATEGORY No Risk No Risk No Risk       Assessment and Plan:    This patient's diagnosis is major depression in remission.  At this time she is on no psychotropics.  She is not in therapy.  She will return to see me in 8 months for reevaluation and for some degree of supportive psychotherapy.  Gypsy Balsam, MD 10/1/20242:35 PM

## 2022-12-12 ENCOUNTER — Telehealth: Payer: Self-pay | Admitting: Adult Health

## 2022-12-12 NOTE — Telephone Encounter (Signed)
Per IB Message on 12/09/22, I called the patient and scheduled a SCP appointment. Patient is aware of the date and time of appointment.

## 2022-12-16 ENCOUNTER — Encounter: Payer: PPO | Admitting: Nurse Practitioner

## 2022-12-24 ENCOUNTER — Other Ambulatory Visit (HOSPITAL_BASED_OUTPATIENT_CLINIC_OR_DEPARTMENT_OTHER): Payer: Self-pay

## 2022-12-25 ENCOUNTER — Other Ambulatory Visit (HOSPITAL_BASED_OUTPATIENT_CLINIC_OR_DEPARTMENT_OTHER): Payer: Self-pay

## 2022-12-25 ENCOUNTER — Other Ambulatory Visit: Payer: Self-pay

## 2022-12-29 DIAGNOSIS — Z0289 Encounter for other administrative examinations: Secondary | ICD-10-CM

## 2022-12-30 ENCOUNTER — Encounter: Payer: Self-pay | Admitting: Family Medicine

## 2022-12-30 ENCOUNTER — Ambulatory Visit (INDEPENDENT_AMBULATORY_CARE_PROVIDER_SITE_OTHER): Payer: PPO | Admitting: Family Medicine

## 2022-12-30 VITALS — BP 120/74 | HR 73 | Temp 97.8°F | Ht 67.0 in | Wt 236.0 lb

## 2022-12-30 DIAGNOSIS — C50212 Malignant neoplasm of upper-inner quadrant of left female breast: Secondary | ICD-10-CM

## 2022-12-30 DIAGNOSIS — E66812 Obesity, class 2: Secondary | ICD-10-CM

## 2022-12-30 DIAGNOSIS — Q796 Ehlers-Danlos syndrome, unspecified: Secondary | ICD-10-CM | POA: Diagnosis not present

## 2022-12-30 DIAGNOSIS — C50412 Malignant neoplasm of upper-outer quadrant of left female breast: Secondary | ICD-10-CM | POA: Diagnosis not present

## 2022-12-30 DIAGNOSIS — Z6836 Body mass index (BMI) 36.0-36.9, adult: Secondary | ICD-10-CM | POA: Diagnosis not present

## 2022-12-30 DIAGNOSIS — Z170 Estrogen receptor positive status [ER+]: Secondary | ICD-10-CM

## 2022-12-30 DIAGNOSIS — G932 Benign intracranial hypertension: Secondary | ICD-10-CM | POA: Diagnosis not present

## 2022-12-30 NOTE — Assessment & Plan Note (Signed)
Managed by Dr Al Pimple, s/p bilateral mastectomies in July with Dr Carolynne Edouard.  She reports stopping Aromasin due to adverse SE.  She understands the risk of not taking this medicine.  Begin active plan for weight reduction to reduce future risk of weight related cancers Keep f/u with Dr Al Pimple to discuss medication management
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
## 2022-12-30 NOTE — Assessment & Plan Note (Signed)
Chronic joint pain has been a barrier to her ability to exercise and this has contributed to her weight gain  Consider exercise options that are a bit easier on joints - yoga, swimming, etc

## 2022-12-30 NOTE — Assessment & Plan Note (Signed)
Managed by Dr Lionel December at [facility] Reports chronic daily headaches that interfere with quality of life Reports 'slipped brain syndrome' and a stent has been planned due to a failed VP shunt  Look for IIH improvements with weight reduction

## 2023-01-14 ENCOUNTER — Ambulatory Visit (INDEPENDENT_AMBULATORY_CARE_PROVIDER_SITE_OTHER): Payer: PPO | Admitting: Family Medicine

## 2023-01-14 ENCOUNTER — Encounter: Payer: Self-pay | Admitting: Family Medicine

## 2023-01-14 VITALS — BP 143/87 | HR 69 | Temp 98.2°F | Ht 67.0 in | Wt 235.0 lb

## 2023-01-14 DIAGNOSIS — G932 Benign intracranial hypertension: Secondary | ICD-10-CM

## 2023-01-14 DIAGNOSIS — E66812 Obesity, class 2: Secondary | ICD-10-CM

## 2023-01-14 DIAGNOSIS — E669 Obesity, unspecified: Secondary | ICD-10-CM

## 2023-01-14 DIAGNOSIS — F419 Anxiety disorder, unspecified: Secondary | ICD-10-CM | POA: Diagnosis not present

## 2023-01-14 DIAGNOSIS — C50412 Malignant neoplasm of upper-outer quadrant of left female breast: Secondary | ICD-10-CM | POA: Diagnosis not present

## 2023-01-14 DIAGNOSIS — F32A Depression, unspecified: Secondary | ICD-10-CM

## 2023-01-14 DIAGNOSIS — R5383 Other fatigue: Secondary | ICD-10-CM | POA: Insufficient documentation

## 2023-01-14 DIAGNOSIS — R0602 Shortness of breath: Secondary | ICD-10-CM

## 2023-01-14 DIAGNOSIS — Z6836 Body mass index (BMI) 36.0-36.9, adult: Secondary | ICD-10-CM

## 2023-01-14 DIAGNOSIS — R7303 Prediabetes: Secondary | ICD-10-CM

## 2023-01-14 NOTE — Assessment & Plan Note (Signed)
Managed by neurosurgery at [facility], April Meyer has a complicated hx with IIH s/p VP shunt and deals with chronic daily HA.  Self medicating with THC which has only worsened weight issues.  We have discussed bariatric surgery to aid in weight reduction which can help IIH.  She lacks insurance coverage for GLP-1 receptor agonists.   Referral placed for [facility] where her neurosurgeon is located

## 2023-01-14 NOTE — Assessment & Plan Note (Signed)
Managed by Dr Al Pimple on exemestane 25 mg daily.   She would like to reduce her risk of future weight related cancers by working on body fat reduction  We have discussed the importance of healthy diet and regular exercise, BMI reduction to reducing future cancer risk

## 2023-01-14 NOTE — Progress Notes (Signed)
PRIMARY CARE AT [facility] AT [facility] [address] [place] [address] Dept: [phone] Dept Fax: [phone]  At a Glance:  Vitals Temp: 98.2 F (36.8 C) BP: (!) 143/87 Pulse Rate: 69 SpO2: 93 %   Anthropometric Measurements Height: 5\' 7"  (1.702 m) Weight: 235 lb (106.6 kg) BMI (Calculated): 36.8 Starting Weight: 235lb Peak Weight: 242lb Waist Measurement : 40.5 inches   Body Composition  Body Fat %: 45.5 % Fat Mass (lbs): 107.2 lbs Muscle Mass (lbs): 121.8 lbs Total Body Water (lbs): 85.4 lbs Visceral Fat Rating : 13   Other Clinical Data RMR: 2174 Fasting: Yes Labs: Yes Today's Visit #: 1 Starting Date: 01/14/23    EKG: Normal sinus rhythm, rate 68 BPM with Left axis deviation.  Indirect Calorimeter completed today shows a VO2 of 315 and a REE of 2174.  Her calculated basal metabolic rate is 6387 thus her basal metabolic rate is better than expected.  Chief Complaint:  Obesity   Subjective:  April Meyer (MR# [id number]) is a 57 y.o. female who presents for evaluation and treatment of obesity and related comorbidities.   April Meyer is currently in the action stage of change and ready to dedicate time achieving and maintaining a healthier weight. April Meyer is interested in becoming our patient and working on intensive lifestyle modifications including (but not limited to) diet and exercise for weight loss.  [name] has been struggling with her weight. She has been unsuccessful in either losing weight, maintaining weight loss, or reaching her healthy weight goal.  She has considered bariatric surgery.  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  She lives with her boyfriend and is divorced.  She has a grown niece that lives with her.  She is inconsistent with exercise.  She would like to lose 80 lb.  She gained weight following trauma.  She has been on lots of diets.  She hates to cook.  She snacks on  candy.  She drinks COKE daily and sugar sweetened coffee.  She craves sweets and is a boredom and stress eater.  She is looking for improvements in daily HA from IIH with weight loss as this greatly interferes with her quality of life.  April Meyer's habits were reviewed today and are as follows:  Eats family meals together, Has significant food craving issues, and Snacks frequently in the evenings   Depression Screen April Meyer's Food and Mood (modified PHQ-9) score was 17.     07/18/2022   12:14 PM  Depression screen PHQ 2/9  Decreased Interest 1  Down, Depressed, Hopeless 1  PHQ - 2 Score 2     Assessment and Plan:   1. SOBOE (shortness of breath on exertion) Patient has had increased SOB with exercise, worse over time with weight gain.  Patient is getting out of breath sooner with activity than previously.  This has worsened recently.  Denies SOB at rest or orthopnea.  2. Other fatigue Reviewed EKG results with patient.  Fatigue has been multifactoral with polypharmacy, depressed mood and chronic pain.  Update labs today.  - EKG 12-Lead   [name] is currently in the action stage of change and her goal is to continue with weight loss efforts. I recommend April Meyer begin the structured treatment plan as follows:  She has agreed to Category 2 Plan  Exercise goals: All adults should avoid inactivity. Some activity is better than none, and adults who participate in any amount of  physical activity, gain some health benefits.  Behavioral modification strategies:increasing lean protein intake, increasing vegetables, increase H2O intake, decrease liquid calories, increase high fiber foods, meal planning and cooking strategies, keeping healthy foods in the home, better snacking choices, and avoiding temptations  She was informed of the importance of frequent follow-up visits to maximize her success with intensive lifestyle modifications for her multiple health conditions. She was informed we would discuss her  lab results at her next visit unless there is a critical issue that needs to be addressed sooner. April Meyer agreed to keep her next visit at the agreed upon time to discuss these results.  Objective:  General: Cooperative, alert, well developed, in no acute distress. HEENT: Conjunctivae and lids unremarkable. Cardiovascular: Regular rhythm.  Lungs: Normal work of breathing. Neurologic: No focal deficits.   Lab Results  Component Value Date   CREATININE 0.80 09/16/2022   BUN 13 09/16/2022   NA 134 (L) 09/16/2022   K 4.1 09/16/2022   CL 102 09/16/2022   CO2 24 09/16/2022   Lab Results  Component Value Date   ALT 13 09/16/2022   AST 15 09/16/2022   ALKPHOS 63 09/16/2022   BILITOT 0.3 09/16/2022   Lab Results  Component Value Date   HGBA1C 5.7 (H) 11/15/2021   HGBA1C 5.9 09/19/2021   HGBA1C 5.1 01/30/2021   HGBA1C 5.6 06/12/2020   No results found for: "INSULIN" Lab Results  Component Value Date   TSH 16.268 (H) 01/30/2021   No results found for: "CHOL", "HDL", "LDLCALC", "LDLDIRECT", "TRIG", "CHOLHDL" Lab Results  Component Value Date   WBC 11.0 (H) 09/16/2022   HGB 10.4 (L) 09/16/2022   HCT 32.1 (L) 09/16/2022   MCV 88.9 09/16/2022   PLT 201 09/16/2022   No results found for: "IRON", "TIBC", "FERRITIN" Problem List Items Addressed This Visit     Prediabetes    Lab Results  Component Value Date   HGBA1C 5.7 (H) 11/15/2021   Hx of prediabetes, on metformin per PCP.  Exercise greatly limited due to chronic pain, chronic daily headache from IIH.  Struggles to comply with dietary restriction.    Begin prescribed meal plan Update labs today      Primary malignant neoplasm of upper outer quadrant of female breast, left (HCC)    Managed by Dr Al Pimple on exemestane 25 mg daily.   She would like to reduce her risk of future weight related cancers by working on body fat reduction  We have discussed the importance of healthy diet and regular exercise, BMI reduction to  reducing future cancer risk      Obesity   Relevant Orders   Amb Referral to Bariatric Surgery   Anxiety and depression    Mood fairly stable on Sertraline 25 mg daily and Buspar 7.5 mg bid per PCP. Mood is effected by chronic pain, inability to function at the level that she used to due to IIH.    Continue current meds She would benefit from ongoing counseling      IIH (idiopathic intracranial hypertension)    Managed by neurosurgery at [facility], Shaunda has a complicated hx with IIH s/p VP shunt and deals with chronic daily HA.  Self medicating with THC which has only worsened weight issues.  We have discussed bariatric surgery to aid in weight reduction which can help IIH.  She lacks insurance coverage for GLP-1 receptor agonists.   Referral placed for [facility] where her neurosurgeon is located  Relevant Orders   Amb Referral to Bariatric Surgery   SOBOE (shortness of breath on exertion) - Primary   Other fatigue   Relevant Orders   EKG 12-Lead   VITAMIN D 25 Hydroxy (Vit-D Deficiency, Fractures)   TSH   T4, free   T3   Lipid panel   Insulin, random   Hemoglobin A1c   Folate   Comprehensive metabolic panel   Vitamin B12   CBC with Differential/Platelet    Attestation Statements:  Reviewed by clinician on day of visit: allergies, medications, problem list, medical history, surgical history, family history, social history, and previous encounter notes.  Time spent on visit including pre-visit chart review and post-visit charting and care was 50 minutes.   Glennis Brink, DO

## 2023-01-14 NOTE — Assessment & Plan Note (Signed)
Lab Results  Component Value Date   HGBA1C 5.7 (H) 11/15/2021   Hx of prediabetes, on metformin per PCP.  Exercise greatly limited due to chronic pain, chronic daily headache from IIH.  Struggles to comply with dietary restriction.    Begin prescribed meal plan Update labs today

## 2023-01-14 NOTE — Assessment & Plan Note (Signed)
Mood fairly stable on Sertraline 25 mg daily and Buspar 7.5 mg bid per PCP. Mood is effected by chronic pain, inability to function at the level that she used to due to IIH.    Continue current meds She would benefit from ongoing counseling

## 2023-01-15 ENCOUNTER — Other Ambulatory Visit (HOSPITAL_BASED_OUTPATIENT_CLINIC_OR_DEPARTMENT_OTHER): Payer: Self-pay

## 2023-01-15 LAB — COMPREHENSIVE METABOLIC PANEL
ALT: 17 [IU]/L (ref 0–32)
AST: 20 [IU]/L (ref 0–40)
Albumin: 4.6 g/dL (ref 3.8–4.9)
Alkaline Phosphatase: 92 [IU]/L (ref 44–121)
BUN/Creatinine Ratio: 18 (ref 9–23)
BUN: 14 mg/dL (ref 6–24)
Bilirubin Total: 0.3 mg/dL (ref 0.0–1.2)
CO2: 21 mmol/L (ref 20–29)
Calcium: 9.6 mg/dL (ref 8.7–10.2)
Chloride: 103 mmol/L (ref 96–106)
Creatinine, Ser: 0.77 mg/dL (ref 0.57–1.00)
Globulin, Total: 2.6 g/dL (ref 1.5–4.5)
Glucose: 108 mg/dL — ABNORMAL HIGH (ref 70–99)
Potassium: 4.6 mmol/L (ref 3.5–5.2)
Sodium: 139 mmol/L (ref 134–144)
Total Protein: 7.2 g/dL (ref 6.0–8.5)
eGFR: 90 mL/min/{1.73_m2} (ref >59)

## 2023-01-15 LAB — INSULIN, RANDOM: INSULIN: 13 u[IU]/mL (ref 2.6–24.9)

## 2023-01-15 LAB — CBC WITH DIFFERENTIAL/PLATELET
Basophils Absolute: 0.1 10*3/uL (ref 0.0–0.2)
Basos: 1 %
EOS (ABSOLUTE): 0.2 10*3/uL (ref 0.0–0.4)
Eos: 3 %
Hematocrit: 39.4 % (ref 34.0–46.6)
Hemoglobin: 12.5 g/dL (ref 11.1–15.9)
Immature Grans (Abs): 0 10*3/uL (ref 0.0–0.1)
Immature Granulocytes: 0 %
Lymphocytes Absolute: 1.7 10*3/uL (ref 0.7–3.1)
Lymphs: 25 %
MCH: 27.3 pg (ref 26.6–33.0)
MCHC: 31.7 g/dL (ref 31.5–35.7)
MCV: 86 fL (ref 79–97)
Monocytes Absolute: 0.6 10*3/uL (ref 0.1–0.9)
Monocytes: 9 %
Neutrophils Absolute: 4.2 10*3/uL (ref 1.4–7.0)
Neutrophils: 62 %
Platelets: 260 10*3/uL (ref 150–450)
RBC: 4.58 x10E6/uL (ref 3.77–5.28)
RDW: 12.8 % (ref 11.7–15.4)
WBC: 6.9 10*3/uL (ref 3.4–10.8)

## 2023-01-15 LAB — T3: T3, Total: 113 ng/dL (ref 71–180)

## 2023-01-15 LAB — FOLATE: Folate: 10 ng/mL (ref >3.0)

## 2023-01-15 LAB — VITAMIN D 25 HYDROXY (VIT D DEFICIENCY, FRACTURES): Vit D, 25-Hydroxy: 29.2 ng/mL — ABNORMAL LOW (ref 30.0–100.0)

## 2023-01-15 LAB — LIPID PANEL
Chol/HDL Ratio: 3.6 ratio (ref 0.0–4.4)
Cholesterol, Total: 193 mg/dL (ref 100–199)
HDL: 54 mg/dL (ref >39)
LDL Chol Calc (NIH): 115 mg/dL — ABNORMAL HIGH (ref 0–99)
Triglycerides: 134 mg/dL (ref 0–149)
VLDL Cholesterol Cal: 24 mg/dL (ref 5–40)

## 2023-01-15 LAB — HEMOGLOBIN A1C
Est. average glucose Bld gHb Est-mCnc: 137 mg/dL
Hgb A1c MFr Bld: 6.4 % — ABNORMAL HIGH (ref 4.8–5.6)

## 2023-01-15 LAB — T4, FREE: Free T4: 1.49 ng/dL (ref 0.82–1.77)

## 2023-01-15 LAB — TSH: TSH: 0.236 u[IU]/mL — ABNORMAL LOW (ref 0.450–4.500)

## 2023-01-15 LAB — VITAMIN B12: Vitamin B-12: 557 pg/mL (ref 232–1245)

## 2023-01-15 MED ORDER — OXYCODONE HCL 10 MG PO TABS
10.0000 mg | ORAL_TABLET | ORAL | 0 refills | Status: DC | PRN
  Filled 2023-01-27: qty 180, 30d supply, fill #0

## 2023-01-15 MED ORDER — PROMETHAZINE HCL 12.5 MG PO TABS
12.5000 mg | ORAL_TABLET | Freq: Three times a day (TID) | ORAL | 1 refills | Status: DC | PRN
  Filled 2023-01-15 – 2023-01-27 (×2): qty 20, 7d supply, fill #0

## 2023-01-15 MED ORDER — OXYCODONE HCL 10 MG PO TABS
10.0000 mg | ORAL_TABLET | Freq: Four times a day (QID) | ORAL | 0 refills | Status: DC | PRN
  Filled 2023-02-26: qty 120, 30d supply, fill #0

## 2023-01-22 ENCOUNTER — Telehealth: Payer: Self-pay | Admitting: *Deleted

## 2023-01-22 NOTE — Telephone Encounter (Signed)
Faxed over referral information and office notes to [facility]. Patient notified.

## 2023-01-26 ENCOUNTER — Other Ambulatory Visit (HOSPITAL_BASED_OUTPATIENT_CLINIC_OR_DEPARTMENT_OTHER): Payer: Self-pay

## 2023-01-27 ENCOUNTER — Other Ambulatory Visit (HOSPITAL_BASED_OUTPATIENT_CLINIC_OR_DEPARTMENT_OTHER): Payer: Self-pay

## 2023-01-27 ENCOUNTER — Ambulatory Visit (INDEPENDENT_AMBULATORY_CARE_PROVIDER_SITE_OTHER): Payer: PPO | Admitting: Family Medicine

## 2023-01-27 ENCOUNTER — Encounter: Payer: Self-pay | Admitting: Family Medicine

## 2023-01-27 VITALS — BP 120/76 | HR 66 | Temp 97.7°F | Ht 67.0 in | Wt 228.0 lb

## 2023-01-27 DIAGNOSIS — E559 Vitamin D deficiency, unspecified: Secondary | ICD-10-CM | POA: Insufficient documentation

## 2023-01-27 DIAGNOSIS — E66812 Obesity, class 2: Secondary | ICD-10-CM

## 2023-01-27 DIAGNOSIS — F509 Eating disorder, unspecified: Secondary | ICD-10-CM | POA: Diagnosis not present

## 2023-01-27 DIAGNOSIS — E119 Type 2 diabetes mellitus without complications: Secondary | ICD-10-CM

## 2023-01-27 DIAGNOSIS — Z6835 Body mass index (BMI) 35.0-35.9, adult: Secondary | ICD-10-CM

## 2023-01-27 DIAGNOSIS — G932 Benign intracranial hypertension: Secondary | ICD-10-CM | POA: Diagnosis not present

## 2023-01-27 DIAGNOSIS — Z7984 Long term (current) use of oral hypoglycemic drugs: Secondary | ICD-10-CM

## 2023-01-27 DIAGNOSIS — E038 Other specified hypothyroidism: Secondary | ICD-10-CM

## 2023-01-27 DIAGNOSIS — Z7985 Long-term (current) use of injectable non-insulin antidiabetic drugs: Secondary | ICD-10-CM

## 2023-01-27 MED ORDER — SEMAGLUTIDE(0.25 OR 0.5MG/DOS) 2 MG/3ML ~~LOC~~ SOPN
0.2500 mg | PEN_INJECTOR | SUBCUTANEOUS | 0 refills | Status: DC
Start: 2023-01-27 — End: 2023-02-26

## 2023-01-27 NOTE — Assessment & Plan Note (Signed)
Managed by neurosurgery at [facility] with chronic daily HA Awaiting another surgical procedure on 12/2 and is instructed to wait to start Ozempic until after this procedure  Keep upcoming visit with [facility] to discuss long term approach to help IIH with long term weight loss from bariatric surgery

## 2023-01-27 NOTE — Patient Instructions (Addendum)
Begin OTC vitamin D 2,000 international units  daily  Let's work on approval of Ozempic, starting at 0.25 mg weekly Wait until after upcoming surgery to start  Call if any problems or questions  Stay on eating plan  Let Dr Hal Hope about the reduced dose of thyroid medicine  Bariatric surgery in as a good back up plan

## 2023-01-27 NOTE — Assessment & Plan Note (Signed)
Continues to struggle with binge eating tendencies with underlying hx of trauma and mood disorder Chronic pain tends to trigger emotional binge eating She is doing better with eating on a schedule and getting lean protein and fiber in with meals  Patient will benefit from CBT with counseling to work on mood related eating Continue to work on eating schedule, planning, stress reduction and keeping trigger foods out of the house

## 2023-01-27 NOTE — Assessment & Plan Note (Signed)
Last vitamin D Lab Results  Component Value Date   VD25OH 29.2 (L) 01/14/2023   Reviewed lab results with patient Currently not on a vitamin D or MVI supplement C/o fatigue Explained the importance of vitamin D for energy level, mood, immune function and bone health  Begin OTC vitamin D 2,000 international units  daily

## 2023-01-27 NOTE — Assessment & Plan Note (Signed)
Reviewed lab results with patient Her TSH is too low and she has removed one of her extra Levothyroxine pills per week from her regimen. She is asymptomatic  F/u with PCP to let her know about lab findings TSH will need to be rechecked in 2-3 mos

## 2023-01-27 NOTE — Progress Notes (Signed)
Office: [phone]  /  Fax: [phone]  WEIGHT SUMMARY AND BIOMETRICS  Starting Date: 01/14/23  Starting Weight: 235lb   Weight Lost Since Last Visit: 7lb   Vitals Temp: 97.7 F (36.5 C) BP: 120/76 Pulse Rate: 66 SpO2: 94 %   Body Composition  Body Fat %: 43.3 % Fat Mass (lbs): 98.8 lbs Muscle Mass (lbs): 123 lbs Total Body Water (lbs): 81.8 lbs Visceral Fat Rating : 12     HPI  Chief Complaint: OBESITY  April Meyer is here to discuss her progress with her obesity treatment plan. She is on the the Category 3 Plan and states she is following her eating plan approximately 85 % of the time. She states she is exercising 0 minutes 0 times per week.   Interval History:  Since last office visit she is down 7 lb She is up 1.2 lb of muscle and down 7.4 lb of body fat in 2 weeks She has been eating all the food on her plan She was sick between visits with a head cold She has occasionally overeaten her snack foods Still having chronic daily HA Considering weight loss surgery, awaiting visit  Pharmacotherapy: none  PHYSICAL EXAM:  Blood pressure 120/76, pulse 66, temperature 97.7 F (36.5 C), height 5\' 7"  (1.702 m), weight 228 lb (103.4 kg), last menstrual period 08/10/2020, SpO2 94%. Body mass index is 35.71 kg/m.  General: She is overweight, cooperative, alert, well developed, and in no acute distress. PSYCH: Has normal mood, affect and thought process.   Lungs: Normal breathing effort, no conversational dyspnea.   ASSESSMENT AND PLAN  TREATMENT PLAN FOR OBESITY:  Recommended Dietary Goals  [name] is currently in the action stage of change. As such, her goal is to continue weight management plan. She has agreed to the Category 3 Plan.  Behavioral Intervention  We discussed the following Behavioral Modification Strategies today: increasing lean protein intake to established goals, increasing vegetables, avoiding skipping meals, increasing water intake , work on  meal planning and preparation, keeping healthy foods at home, practice mindfulness eating and understand the difference between hunger signals and cravings, work on managing stress, creating time for self-care and relaxation, avoiding temptations and identifying enticing environmental cues, planning for success, and continue to work on maintaining a reduced calorie state, getting the recommended amount of protein, incorporating whole foods, making healthy choices, staying well hydrated and practicing mindfulness when eating..  Additional resources provided today: NA  Recommended Physical Activity Goals  [name] has been advised to work up to 150 minutes of moderate intensity aerobic activity a week and strengthening exercises 2-3 times per week for cardiovascular health, weight loss maintenance and preservation of muscle mass.   She has agreed to Think about enjoyable ways to increase daily physical activity and overcoming barriers to exercise and Increase physical activity in their day and reduce sedentary time (increase NEAT).  Pharmacotherapy changes for the treatment of obesity:   ASSOCIATED CONDITIONS ADDRESSED TODAY  Type 2 diabetes mellitus without complication, without long-term current use of insulin (HCC) Assessment & Plan: Has reported 2 hr post- prandial glucose readings 179, 180 and 2 AM fasting glucose readings >126. A1c 6.4 on metformin 500 mg XR Reviewed results on labs together + fam hx of T2DM - aunts, grandma, sister Has had both high and low blood sugar readings despite eating on a schedule and a normal fasting insulin level  Patient denies a personal or family history of pancreatitis, medullary thyroid carcinoma or multiple endocrine neoplasia type  II. Recommend reviewing pen training video online. Begin Ozempic 0.25 mg once weekly injection Use along with prescribed dietary plan.  Reviewed potential adverse SE    Orders: -     Semaglutide(0.25 or 0.5MG /DOS); Inject  0.25 mg into the skin once a week.  Dispense: 3 mL; Refill: 0  Class 2 severe obesity due to excess calories with serious comorbidity and body mass index (BMI) of 35.0 to 35.9 in adult [facility])  Vitamin D deficiency Assessment & Plan: Last vitamin D Lab Results  Component Value Date   VD25OH 29.2 (L) 01/14/2023   Reviewed lab results with patient Currently not on a vitamin D or MVI supplement C/o fatigue Explained the importance of vitamin D for energy level, mood, immune function and bone health  Begin OTC vitamin D 2,000 international units  daily   IIH (idiopathic intracranial hypertension) Assessment & Plan: Managed by neurosurgery at [facility] with chronic daily HA Awaiting another surgical procedure on 12/2 and is instructed to wait to start Ozempic until after this procedure  Keep upcoming visit with [facility] Bariatric surgery to discuss long term approach to help IIH with long term weight loss from bariatric surgery   Eating disorder, unspecified type Assessment & Plan: Continues to struggle with binge eating tendencies with underlying hx of trauma and mood disorder Chronic pain tends to trigger emotional binge eating She is doing better with eating on a schedule and getting lean protein and fiber in with meals  Patient will benefit from CBT with counseling to work on mood related eating Continue to work on eating schedule, planning, stress reduction and keeping trigger foods out of the house   Other specified hypothyroidism Assessment & Plan: Reviewed lab results with patient Her TSH is too low and she has removed one of her extra Levothyroxine pills per week from her regimen. She is asymptomatic  F/u with PCP to let her know about lab findings TSH will need to be rechecked in 2-3 mos       She was informed of the importance of frequent follow up visits to maximize her success with intensive lifestyle modifications for her multiple health  conditions.   ATTESTASTION STATEMENTS:  Reviewed by clinician on day of visit: allergies, medications, problem list, medical history, surgical history, family history, social history, and previous encounter notes pertinent to obesity diagnosis.   I have personally spent 30 minutes total time today in preparation, patient care, nutritional counseling and documentation for this visit, including the following: review of clinical lab tests; review of medical tests/procedures/services.      Glennis Brink, DO DABFM, DABOM [facility] [address] [place], [place] [address] [phone]

## 2023-01-27 NOTE — Assessment & Plan Note (Addendum)
Has reported 2 hr post- prandial glucose readings 179, 180 and 2 AM fasting glucose readings >126. A1c 6.4 on metformin 500 mg XR Reviewed results on labs together + fam hx of T2DM - aunts, grandma, sister Has had both high and low blood sugar readings despite eating on a schedule and a normal fasting insulin level  Patient denies a personal or family history of pancreatitis, medullary thyroid carcinoma or multiple endocrine neoplasia type II. Recommend reviewing pen training video online. Begin Ozempic 0.25 mg once weekly injection Use along with prescribed dietary plan.  Reviewed potential adverse SE

## 2023-01-28 ENCOUNTER — Telehealth: Payer: Self-pay | Admitting: *Deleted

## 2023-01-28 ENCOUNTER — Ambulatory Visit: Payer: PPO | Admitting: Family Medicine

## 2023-01-28 NOTE — Telephone Encounter (Signed)
Prior authorization done via cover my meds for patients Ozempic. Waiting on determination.  

## 2023-01-28 NOTE — Telephone Encounter (Signed)
Prior authorization approved for patients Ozempic. Message from Plan 20-NOV-24:20-NOV-25 Ozempic (0.25 or 0.5 MG/DOSE) 2MG /3ML [place] SOPN Quantity:3;

## 2023-01-29 ENCOUNTER — Ambulatory Visit: Payer: PPO | Admitting: Plastic Surgery

## 2023-01-29 ENCOUNTER — Encounter: Payer: Self-pay | Admitting: Plastic Surgery

## 2023-01-29 DIAGNOSIS — Z170 Estrogen receptor positive status [ER+]: Secondary | ICD-10-CM

## 2023-01-29 DIAGNOSIS — N6489 Other specified disorders of breast: Secondary | ICD-10-CM | POA: Insufficient documentation

## 2023-01-29 DIAGNOSIS — N651 Disproportion of reconstructed breast: Secondary | ICD-10-CM

## 2023-01-29 DIAGNOSIS — C50212 Malignant neoplasm of upper-inner quadrant of left female breast: Secondary | ICD-10-CM | POA: Diagnosis not present

## 2023-01-29 DIAGNOSIS — I7300 Raynaud's syndrome without gangrene: Secondary | ICD-10-CM

## 2023-01-29 DIAGNOSIS — C50412 Malignant neoplasm of upper-outer quadrant of left female breast: Secondary | ICD-10-CM

## 2023-01-29 DIAGNOSIS — Z9013 Acquired absence of bilateral breasts and nipples: Secondary | ICD-10-CM | POA: Diagnosis not present

## 2023-01-29 DIAGNOSIS — C719 Malignant neoplasm of brain, unspecified: Secondary | ICD-10-CM

## 2023-01-29 DIAGNOSIS — Q796 Ehlers-Danlos syndrome, unspecified: Secondary | ICD-10-CM

## 2023-01-29 NOTE — Progress Notes (Signed)
   Subjective:    Patient ID: April Meyer, female    DOB: August 11, 1965, 57 y.o.   MRN: [id number]  The patient is a 57 year old female here for evaluation of her breasts.  She was diagnosed with breast cancer in May.  It was left breast upper inner quadrant.  The biopsy showed invasive ductal carcinoma that was estrogen and progesterone positive and HER2 negative with a Ki-67 of 5%.  She also has multiple medical issues including Ehlers-Danlos, Arnold-Chiari malformation and diabetes.  After several discussions the patient opted to not have reconstruction due to her multiple medical conditions.  I think this was a very smart idea.  She now has some pretty severe asymmetry from bilateral mastectomies in June.  She would like to just have the medial and lateral portion of both breasts evened out.  This is very reasonable.  She does have a surgery planned at [facility] to remove a bone that is pushing on her carotid artery.      Review of Systems  Constitutional: Negative.   Eyes: Negative.   Respiratory: Negative.    Cardiovascular: Negative.   Gastrointestinal: Negative.   Genitourinary: Negative.   Musculoskeletal:  Positive for gait problem ([id number]).       Objective:   Physical Exam Constitutional:      Appearance: Normal appearance.  Cardiovascular:     Rate and Rhythm: Normal rate.     Pulses: Normal pulses.  Pulmonary:     Effort: Pulmonary effort is normal.  Musculoskeletal:        General: No swelling.  Skin:    General: Skin is warm.     Capillary Refill: Capillary refill takes less than 2 seconds.     Coloration: Skin is not jaundiced.     Findings: No bruising or lesion.  Neurological:     Mental Status: She is alert and oriented to person, place, and time.  Psychiatric:        Mood and Affect: Mood normal.        Behavior: Behavior normal.        Thought Content: Thought content normal.        Judgment: Judgment normal.           Assessment & Plan:      ICD-10-CM   1. Malignant neoplasm of upper-inner quadrant of left breast in female, estrogen receptor positive (HCC)  C50.212    Z17.0     2. Postoperative breast asymmetry  N64.89        Plan for removal excision of medial and lateral excess breast tissue with liposuction.  Pictures were obtained of the patient and placed in the chart with the patient's or guardian's permission.

## 2023-02-04 ENCOUNTER — Telehealth (INDEPENDENT_AMBULATORY_CARE_PROVIDER_SITE_OTHER): Payer: Self-pay | Admitting: Family Medicine

## 2023-02-04 NOTE — Telephone Encounter (Signed)
Patient called in stating her Ozempic was approved by [employer], however the pharmacy cannot fill the script until correspondence is sent back to [employer].

## 2023-02-04 NOTE — Telephone Encounter (Signed)
Phone call to pharmacy to let them know it was approved. Per the pharmacy patients Ozempic is ready for pick up. Phone call back to patient to let her know her Ozempic is ready for pick up at [facility].

## 2023-02-17 ENCOUNTER — Telehealth: Payer: Self-pay | Admitting: Adult Health

## 2023-02-17 NOTE — Telephone Encounter (Signed)
 Patient aware of rescheduled appointment

## 2023-02-24 ENCOUNTER — Encounter: Payer: Self-pay | Admitting: Surgical

## 2023-02-24 ENCOUNTER — Ambulatory Visit: Payer: PPO | Admitting: Surgical

## 2023-02-24 VITALS — BP 121/80 | HR 92 | Ht 67.0 in | Wt 228.2 lb

## 2023-02-24 DIAGNOSIS — Z170 Estrogen receptor positive status [ER+]: Secondary | ICD-10-CM

## 2023-02-24 DIAGNOSIS — N6489 Other specified disorders of breast: Secondary | ICD-10-CM

## 2023-02-24 DIAGNOSIS — C50212 Malignant neoplasm of upper-inner quadrant of left female breast: Secondary | ICD-10-CM

## 2023-02-24 MED ORDER — ONDANSETRON HCL 4 MG PO TABS: 4.0000 mg | ORAL_TABLET | Freq: Three times a day (TID) | ORAL | 0 refills | Status: DC | PRN

## 2023-02-24 MED ORDER — CEPHALEXIN 500 MG PO CAPS: 500.0000 mg | ORAL_CAPSULE | Freq: Four times a day (QID) | ORAL | 0 refills | Status: AC

## 2023-02-24 NOTE — Progress Notes (Cosign Needed Addendum)
Patient ID: April Meyer, female    DOB: [birth year], 57 y.o.   MRN: [id number]  Chief Complaint  Patient presents with   Pre-op Exam      ICD-10-CM   1. Malignant neoplasm of upper-inner quadrant of left breast in female, estrogen receptor positive (HCC)  C50.212    Z17.0     2. Postoperative breast asymmetry  N64.89       History of Present Illness: April Meyer is a 57 y.o.  female  with a history of bilateral mastectomy with subsequent excess medial and lateral left tissue/skin.  She presents for preoperative evaluation for upcoming procedure, excision of excess medial and lateral breasts, scheduled for 03/25/2023 with Dr. Ulice Bold.  The patient has not had problems with anesthesia. No history of DVT/PE.  No family history of DVT/PE.  No family or personal history of bleeding or clotting disorders.  Patient is not currently taking any blood thinners.  No history of CVA/MI.   Patient reports she has been feeling well lately, she is recovering well from her styloidectomy surgery on 02/12/2023.  She tolerated this procedure well without any complication.  PMH Significant for: Hyperlipidemia, asthma (well-controlled), OSA (resolved), migraines, AVM, idiopathic intracranial hypertension, history of breast cancer, DM2 with recent A1c 6.4, GERD, Ehlers-Danlos/hypermobility, anxiety/depression.  She is on semaglutide, discussed holding this about 2 weeks prior to surgery.  She is recently status post right styloidectomy with neurosurgery on 02/12/2023.  She is on chronic oxycodone 10 mg 6 times per day -she reports she does not need any postoperative medications for pain control.  She reports she has not had any cardiac or pulmonary symptoms, she denies any cardiac disease.  She feels well prepared for surgery and does not have any specific questions about the procedure.  She feels as if she has a good understanding of surgical expectations.  Past Medical History: Allergies: Allergies   Allergen Reactions   Advair Hfa [Fluticasone-Salmeterol] Anaphylaxis   Other Anaphylaxis    Steroids and Zucchini    Prednisone Anaphylaxis   Morphine And Codeine Itching    Severe    Current Medications:  Current Outpatient Medications:    acetaminophen (TYLENOL) 500 MG tablet, Take 1 tablet (500 mg total) by mouth every 6 (six) hours as needed (pain)., Disp: 30 tablet, Rfl: 0   acetaminophen (TYLENOL) 500 MG tablet, Take 500 mg by mouth every 6 (six) hours as needed for moderate pain or fever., Disp: , Rfl:    albuterol (VENTOLIN HFA) 108 (90 Base) MCG/ACT inhaler, Inhale 1-2 puffs into the lungs every 4 (four) hours as needed for wheezing or shortness of breath., Disp: , Rfl:    busPIRone (BUSPAR) 7.5 MG tablet, Take 1 tablet (7.5 mg total) by mouth 2 (two) times daily., Disp: 60 tablet, Rfl: 5   cyclobenzaprine (FLEXERIL) 5 MG tablet, Take 1 tablet (5 mg total) by mouth 3 (three) times daily as needed., Disp: 90 tablet, Rfl: 2   cyclobenzaprine (FLEXERIL) 5 MG tablet, Take 1 tablet (5 mg total) by mouth 3 (three) times daily as needed., Disp: 90 tablet, Rfl: 2   dexlansoprazole (DEXILANT) 60 MG capsule, Take 60 mg by mouth at bedtime., Disp: , Rfl:    exemestane (AROMASIN) 25 MG tablet, Take 1 tablet (25 mg total) by mouth daily after breakfast., Disp: 90 tablet, Rfl: 1   gabapentin (NEURONTIN) 100 MG capsule, Take 1 capsule (100 mg total) by mouth at bedtime. (Patient taking differently: Take 100 mg by  mouth at bedtime as needed (hot flashes).), Disp: 90 capsule, Rfl: 3   ibuprofen (ADVIL) 200 MG tablet, Take 200 mg by mouth every 6 (six) hours as needed for fever or moderate pain., Disp: , Rfl:    ibuprofen (ADVIL) 600 MG tablet, Take 1 tablet (600 mg total) by mouth every 6 (six) hours as needed., Disp: 30 tablet, Rfl: 0   ipratropium-albuterol (DUONEB) 0.5-2.5 (3) MG/3ML SOLN, Take 3 mLs by nebulization every 6 (six) hours as needed (sob/wheezing)., Disp: , Rfl:    lamoTRIgine  (LAMICTAL) 25 MG tablet, Take 1 tablet by mouth daily., Disp: , Rfl:    loratadine (CLARITIN) 10 MG tablet, Take 10 mg by mouth daily., Disp: , Rfl:    Menthol, Topical Analgesic, (BIOFREEZE EX), Apply 1 Application topically daily as needed (pain)., Disp: , Rfl:    metFORMIN (GLUCOPHAGE-XR) 500 MG 24 hr tablet, Take 500 mg by mouth daily., Disp: , Rfl:    ondansetron (ZOFRAN) 8 MG tablet, Take 8 mg by mouth every 8 (eight) hours as needed for nausea or vomiting., Disp: , Rfl:    Oxycodone HCl 10 MG TABS, Take 1 tablet (10 mg total) by mouth 4 (four) times daily as needed for pain, Disp: 120 tablet, Rfl: 0   Oxycodone HCl 10 MG TABS, Take 1 tablet (10 mg total) by mouth 4 (four) times daily as needed. For pain, Disp: 120 tablet, Rfl: 0   Oxycodone HCl 10 MG TABS, Take 1 tablet (10 mg total) by mouth 4 (four) times daily as needed. For pain *10/23/22*, Disp: 120 tablet, Rfl: 0   Oxycodone HCl 10 MG TABS, Take 1 tablet (10 mg total) by mouth 4 (four) times daily as needed for pain., Disp: 120 tablet, Rfl: 0   Oxycodone HCl 10 MG TABS, Take 1 tablet (10 mg total) by mouth 4 (four) times daily as needed for pain., Disp: 120 tablet, Rfl: 0   Oxycodone HCl 10 MG TABS, Take 1 tablet by mouth four times a day as needed for pain, Disp: 120 tablet, Rfl: 0   Oxycodone HCl 10 MG TABS, Take 1 tablet by mouth every four hours as needed for pain, Disp: 180 tablet, Rfl: 0   polyethylene glycol powder (GLYCOLAX/MIRALAX) 17 GM/SCOOP powder, Take 17 g by mouth daily. Drink 17g (1 scoop) dissolved in water per day. (Patient taking differently: Take 17 g by mouth daily as needed for moderate constipation. Drink 17g (1 scoop) dissolved in water), Disp: 255 g, Rfl: 0   promethazine (PHENERGAN) 12.5 MG tablet, Take 1 tablet (12.5 mg total) by mouth 3 (three) times daily as needed., Disp: 20 tablet, Rfl: 1   promethazine (PHENERGAN) 12.5 MG tablet, Take 1 tablet by mouth three times a day as needed, Disp: 20 tablet, Rfl: 1    rizatriptan (MAXALT-MLT) 10 MG disintegrating tablet, Take 10 mg by mouth as needed for migraine., Disp: , Rfl:    Semaglutide,0.25 or 0.5MG /DOS, 2 MG/3ML SOPN, Inject 0.25 mg into the skin once a week., Disp: 3 mL, Rfl: 0   sertraline (ZOLOFT) 25 MG tablet, Take 1 tablet (25 mg total) by mouth daily., Disp: 90 tablet, Rfl: 2   SYNTHROID 137 MCG tablet, Take 137 mcg by mouth every morning., Disp: , Rfl:    UNABLE TO FIND, Take 1-2 tablets by mouth 2 (two) times daily as needed (sleep/pain). Cbd gummies, Disp: , Rfl:    valACYclovir (VALTREX) 1000 MG tablet, TAKE 2 TABS (2 GRAMS) BY MOUTH NOW AND REPEAT IN  12 HOURS X 1, AS NEEDED FOR COLDSORE (Patient taking differently: Take 1,000-2,000 mg by mouth daily as needed (cold sores). TAKE 2 TABS (2 GRAMS) BY MOUTH NOW AND REPEAT IN 12 HOURS X 1, AS NEEDED FOR COLDSORE), Disp: 30 tablet, Rfl: 1  Past Medical Problems: Past Medical History:  Diagnosis Date   ADHD (attention deficit hyperactivity disorder)    no meds for   Anxiety    Arnold-Chiari deformity (HCC)    Asthma    AVM (arteriovenous malformation) spine 04/21/2018   Back pain    Breast cancer (HCC) 07/10/2022   Left   Bruises easily    Cerebral venous sinus thrombosis, remote, resolved 03/28/2013   Chronic pain    Common migraine with intractable migraine 04/21/2018   Complication of anesthesia age 26    breast lumpectomy heart rate went way down in hospital stayed 2 days, no problems since   Complication of anesthesia    C 1 to C 3 fusion side to side and up and down limited    Constipation    Depression    EDS (Ehlers-Danlos syndrome)    Eustachian tube dysfunction    Fatigue    GERD (gastroesophageal reflux disease)    Glioma (HCC)    low graded tectal plate glioma   Hemorrhoids    History of sleep apnea    no cpap used last 2 years, normal sleep study 2 yrs ago   HSV infection    Hx of blood clots    Hypothyroid    Hypothyroidism    IIH (idiopathic intracranial  hypertension)    Joint pain    Lactose intolerance    Lumbar disc herniation    Migraine    Neck stiffness    Neuromuscular disorder (HCC)    Ehlers-Danlos Syndrome   Nystagmus, positional, central type 03/28/2013   Other malaise and fatigue 03/28/2013   " The patient reports feeling happy, ED, sore" on she has undergone at Chiari malformation surgery decompression in 2011. Prior to the surgery were chief complaints were weakness numbness and neck problems. She had swallowing difficulties and dizziness;  Classic Chiari presentation. But she didn't have headaches.   Photophobia    Pneumonia    Pre-diabetes    Sleep apnea    SOB (shortness of breath)    Tinnitus of both ears mainly in left ear    Urinary incontinence    Vision disturbance    Vitamin D deficiency    Weakness    Wears glasses     Past Surgical History: Past Surgical History:  Procedure Laterality Date   ARNOLD CHIARI REPAIR  2010   BLADDER SUSPENSION N/A 11/21/2021   Procedure: SINGLE INCISION SLING PROCEDURE (Altis Sling);  Surgeon: Marguerita Beards, MD;  Location: [facility];  Service: Gynecology;  Laterality: N/A;   Brain shunt  12/30/2019   vp shunt   BRAIN SURGERY     BREAST BIOPSY Left    lumpectomy age 66    BREAST BIOPSY Left 07/10/2022   [place] LT BREAST BX W LOC DEV 1ST LESION IMG BX SPEC US GUIDE 07/10/2022 [facility]   BREAST EXCISIONAL BIOPSY Right yrs ago   CHOLECYSTECTOMY  age 60   laparoscopic   colonscopy  2012, 2018   CYSTOSCOPY N/A 11/21/2021   Procedure: CYSTOSCOPY;  Surgeon: Marguerita Beards, MD;  Location: [facility];  Service: Gynecology;  Laterality: N/A;   DILATION AND CURETTAGE OF UTERUS  last done yrs ago   x 2  or 3   ENDOMETRIAL ABLATION  02/09/2013   [facility]   ESOPHAGOGASTRODUODENOSCOPY N/A 02/05/2021   Procedure: ESOPHAGOGASTRODUODENOSCOPY (EGD);  Surgeon: Jeani Hawking, MD;  Location: [facility];  Service: Endoscopy;  Laterality: N/A;   HYSTEROSCOPY WITH D & C N/A  06/26/2020   Procedure: DILATATION AND CURETTAGE /HYSTEROSCOPY;  Surgeon: Romualdo Bolk, MD;  Location: [facility] [place];  Service: Gynecology;  Laterality: N/A;   INTRAUTERINE DEVICE INSERTION  10/2010   Mirena   IUD REMOVAL  11/2010   could not tolerate hormonal side effects   MASTECTOMY W/ SENTINEL NODE BIOPSY Left 08/25/2022   Procedure: LEFT MASTECTOMY WITH SENTINEL LYMPH NODE BIOPSY;  Surgeon: Griselda Miner, MD;  Location: [facility];  Service: General;  Laterality: Left;   metal plate removed from head  2015   screw came loose   neck fusion c1-c3  2010   OPERATIVE ULTRASOUND N/A 06/26/2020   Procedure: OPERATIVE ULTRASOUND;  Surgeon: Romualdo Bolk, MD;  Location: [facility];  Service: Gynecology;  Laterality: N/A;   SIMPLE MASTECTOMY WITH AXILLARY SENTINEL NODE BIOPSY Right 08/25/2022   Procedure: RIGHT SIMPLE MASTECTOMY;  Surgeon: Griselda Miner, MD;  Location: [facility];  Service: General;  Laterality: Right;   TONSILLECTOMY AND ADENOIDECTOMY  age 74   UPPER GI ENDOSCOPY  2012, 2018   XI ROBOTIC ASSISTED TOTAL HYSTERECTOMY WITH SACROCOLPOPEXY N/A 11/21/2021   Procedure: XI ROBOTIC ASSISTED TOTAL HYSTERECTOMY WITH BILATERAL SALPINGECTOMY  AND SACROCOLPOPEXY;  Surgeon: Marguerita Beards, MD;  Location: [facility];  Service: Gynecology;  Laterality: N/A;  TOTAL TIME REQUESTED IS 3.5HRS    Social History: Social History   Socioeconomic History   Marital status: Divorced    Spouse name: Not on file   Number of children: 1   Years of education: 16   Highest education level: Not on file  Occupational History    Employer: OTHER  Tobacco Use   Smoking status: Former    Current packs/day: 0.00    Average packs/day: 2.0 packs/day for 21.0 years (42.0 ttl pk-yrs)    Types: Cigarettes    Start date: 09/09/1986    Quit date: 09/09/2007    Years since quitting: 15.4   Smokeless tobacco: Never  Vaping Use   Vaping  status: Never Used  Substance and Sexual Activity   Alcohol use: No    Comment: h/o binge drinking none in 10 years   Drug use: Yes    Types: Marijuana    Comment: daily  use of marijuana recently   Sexual activity: Not Currently    Partners: Male    Birth control/protection: Other-see comments    Comment: Vasectomy-1st intercourse 57 yo-More than 5 partners  Other Topics Concern   Not on file  Social History Narrative   Patient is divorced and lives at home with her one child.   Patient is disabled.   Patient is right-handed.   Patient has a college education.   Patient drinks one cup of coffee daily.   Social Drivers of Corporate investment banker Strain: Not on file  Food Insecurity: Low Risk  (02/12/2023)   Received from [facility]   Hunger Vital Sign    Worried About Running Out of Food in the Last Year: Never true    Ran Out of Food in the Last Year: Never true  Transportation Needs: No Transportation Needs (02/12/2023)   Received from [facility]    In  the past 12 months, has lack of reliable transportation kept you from medical appointments, meetings, work or from getting things needed for daily living? : No  Physical Activity: Not on file  Stress: Not on file  Social Connections: Not on file  Intimate Partner Violence: Not At Risk (07/18/2022)   Humiliation, Afraid, Rape, and Kick questionnaire    Fear of Current or Ex-Partner: No    Emotionally Abused: No    Physically Abused: No    Sexually Abused: No    Family History: Family History  Problem Relation Age of Onset   Aortic dissection Father    Cancer Father        GI; dx after 68   Melanoma Father        dx after 24   Breast cancer Sister 10       bilateral   Diabetes Maternal Aunt    Breast cancer Maternal Aunt        x3 mat aunts; dx after 31; one w/ multiple primaries   Uterine cancer Maternal Aunt        dx 40s   Diabetes Maternal Uncle    Breast cancer Paternal Aunt 31    Lung cancer Paternal Aunt    Pancreatic cancer Paternal Uncle 45   Diabetes Maternal Grandmother    Cancer Maternal Grandmother        skin and lung; dx after 50   Diabetes Paternal Grandmother    Breast cancer Other        MGF's mother; dx after 76   Rectal cancer Cousin        d. 1s   Brain cancer Cousin     Review of Systems: Review of Systems  Constitutional: Negative.   Respiratory:  Negative for cough and shortness of breath.   Cardiovascular:  Negative for chest pain, palpitations and leg swelling.  Gastrointestinal: Negative.   Neurological: Negative.     Physical Exam: Vital Signs BP 121/80 (BP Location: Right Arm, Patient Position: Sitting, Cuff Size: Large)   Pulse 92   Ht 5\' 7"  (1.702 m)   Wt 228 lb 3.2 oz (103.5 kg)   LMP 08/10/2020   SpO2 95%   BMI 35.74 kg/m   Physical Exam Constitutional:      General: Not in acute distress.    Appearance: Normal appearance. Not ill-appearing.  HENT:     Head: Normocephalic and atraumatic.  Eyes:     Pupils: Pupils are equal, round Neck:     Musculoskeletal: Normal range of motion.  Cardiovascular:     Rate and Rhythm: Normal rate    Pulses: Normal pulses.  Pulmonary:     Effort: Pulmonary effort is normal. No respiratory distress.  Abdominal:     General: Abdomen is flat. There is no distension.  Musculoskeletal: Normal range of motion.  Skin:    General: Skin is warm and dry.     Findings: No erythema or rash.  Neurological:     General: No focal deficit present.     Mental Status: Alert and oriented to person, place, and time. Mental status is at baseline.     Motor: No weakness.  Psychiatric:        Mood and Affect: Mood normal.        Behavior: Behavior normal.    Assessment/Plan: The patient is scheduled for excision of excess medial and lateral breast with Dr. Ulice Bold.  Risks, benefits, and alternatives of procedure discussed, questions answered and consent obtained.  Smoking Status:  Non-smoker; Counseling Given?  N/A  Caprini Score: 7, high; Risk Factors include: Age, history of breast cancer, history of varicose veins, BMI > 25, and length of planned surgery. Recommendation for mechanical prophylaxis. Encourage early ambulation.   Pictures obtained: @consult   Post-op Rx sent to pharmacy:  Zofran, Keflex  Patient was provided with the General Surgical Risk consent document and Pain Medication Agreement prior to their appointment.  They had adequate time to read through the risk consent documents and Pain Medication Agreement. We also discussed them in person together during this preop appointment. All of their questions were answered to their satisfaction.  Recommended calling if they have any further questions.  Risk consent form and Pain Medication Agreement to be scanned into patient's chart.  The risks that can be encountered with and after liposuction were discussed and include the following but no limited to these:  Asymmetry, fluid accumulation, firmness of the area, fat necrosis with death of fat tissue, bleeding, infection, delayed healing, anesthesia risks, skin sensation changes, injury to structures including nerves, blood vessels, and muscles which may be temporary or permanent, allergies to tape, suture materials and glues, blood products, topical preparations or injected agents, skin and contour irregularities, skin discoloration and swelling, deep vein thrombosis, cardiac and pulmonary complications, pain, which may persist, persistent pain, recurrence of the lesion, poor healing of the incision, possible need for revisional surgery or staged procedures. Thiere can also be persistent swelling, poor wound healing, rippling or loose skin, worsening of cellulite, swelling, and thermal burn or heat injury from ultrasound with the ultrasound-assisted lipoplasty technique. Any change in weight fluctuations can alter the outcome.  No surgical clearance necessary per  surgeon.  Electronically signed by: Kermit Balo Eliz Nigg, PA-C 02/24/2023 2:55 PM

## 2023-02-26 ENCOUNTER — Other Ambulatory Visit (HOSPITAL_BASED_OUTPATIENT_CLINIC_OR_DEPARTMENT_OTHER): Payer: Self-pay

## 2023-02-26 ENCOUNTER — Encounter: Payer: Self-pay | Admitting: Family Medicine

## 2023-02-26 ENCOUNTER — Ambulatory Visit (INDEPENDENT_AMBULATORY_CARE_PROVIDER_SITE_OTHER): Payer: PPO | Admitting: Family Medicine

## 2023-02-26 ENCOUNTER — Ambulatory Visit: Payer: PPO | Admitting: Family Medicine

## 2023-02-26 VITALS — BP 116/78 | HR 72 | Temp 98.6°F | Ht 67.0 in | Wt 225.0 lb

## 2023-02-26 DIAGNOSIS — E038 Other specified hypothyroidism: Secondary | ICD-10-CM

## 2023-02-26 DIAGNOSIS — G932 Benign intracranial hypertension: Secondary | ICD-10-CM

## 2023-02-26 DIAGNOSIS — E66812 Obesity, class 2: Secondary | ICD-10-CM

## 2023-02-26 DIAGNOSIS — E119 Type 2 diabetes mellitus without complications: Secondary | ICD-10-CM

## 2023-02-26 DIAGNOSIS — Z6835 Body mass index (BMI) 35.0-35.9, adult: Secondary | ICD-10-CM

## 2023-02-26 DIAGNOSIS — Z7985 Long-term (current) use of injectable non-insulin antidiabetic drugs: Secondary | ICD-10-CM

## 2023-02-26 MED ORDER — SEMAGLUTIDE(0.25 OR 0.5MG/DOS) 2 MG/3ML ~~LOC~~ SOPN: 0.5000 mg | PEN_INJECTOR | SUBCUTANEOUS | 1 refills | Status: DC

## 2023-02-26 NOTE — Assessment & Plan Note (Signed)
Headaches are starting to slowly improve.  She is status post right styloidectomy for jugular vein stenosis 12/5.  She is followed closely by neurosurgery at [facility].  We have previously discussed the role of bariatric surgery to potentially help her IIH but she is starting to see a reduction in headache frequency with the start of Ozempic.  Continue current plan of care per neurosurgery.  Continue Ozempic while actively working on improving nutrition, physical activity and weight reduction.

## 2023-02-26 NOTE — Assessment & Plan Note (Signed)
She has done well with new start Ozempic currently at 0.5 mg once weekly injection with improved food volumes at mealtime, avoiding meal skipping, making better food choices with a focus on lean protein and fiber with meals and snacks.  She denies nausea, heartburn or constipation.  She has done well with 10 pounds of weight loss in the past 7 weeks of medically supervised weight management.  She plans to increase walking time once she has completed her January surgery.  Continue Ozempic at 0.5 mg once weekly injection.  She will pause her Ozempic once again 03/12/2023 and resume at 03/26/2023 for upcoming breast reconstructive surgery

## 2023-02-26 NOTE — Assessment & Plan Note (Signed)
She has been taking her levothyroxine 137 mcg once daily and is due for recheck level next visit

## 2023-02-26 NOTE — Progress Notes (Signed)
Office: [phone]  /  Fax: [phone]  WEIGHT SUMMARY AND BIOMETRICS  Starting Date: 01/14/23  Starting Weight: 235lb   Weight Lost Since Last Visit: 3lb   Vitals Temp: 98.6 F (37 C) BP: 116/78 Pulse Rate: 72 SpO2: 98 %   Body Composition  Body Fat %: 43.8 % Fat Mass (lbs): 98.8 lbs Muscle Mass (lbs): 120.4 lbs Total Body Water (lbs): 81.2 lbs Visceral Fat Rating : 12     HPI  Chief Complaint: OBESITY  April Meyer is here to discuss her progress with her obesity treatment plan. She is on the the Category 3 Plan and states she is following her eating plan approximately 30 % of the time. She states she is exercising 0 minutes 0 times per week.   Interval History:  Since last office visit she is down 3 lb This gives her a net weight loss of 10 pounds in the past 7 weeks of medically supervised weight management She has done well starting Ozempic, at 0.5 mg weekly.  She accidentally increased her dose from 0.25 to 0.5 mg after 2 weeks. She has improved satiety, no nausea, heartburn She denies low blood sugars She did hold Ozempic for recent styloidectomy for her jugular vein stenosis with Dr. Lionel December She has not been able to ramp up exercise but is feeling better overall She is scheduled for upcoming breast reconstructive surgery 1/15  Pharmacotherapy: Ozempic 0.5 mg once weekly injection  PHYSICAL EXAM:  Blood pressure 116/78, pulse 72, temperature 98.6 F (37 C), height 5\' 7"  (1.702 m), weight 225 lb (102.1 kg), last menstrual period 08/10/2020, SpO2 98%. Body mass index is 35.24 kg/m.  General: She is overweight, cooperative, alert, well developed, and in no acute distress. PSYCH: Has normal mood, improved, affect and thought process.   Lungs: Normal breathing effort, no conversational dyspnea.   ASSESSMENT AND PLAN  TREATMENT PLAN FOR OBESITY:  Recommended Dietary Goals  [name] is currently in the action stage of change. As such, her goal is to continue  weight management plan. She has agreed to practicing portion control and making smarter food choices, such as increasing vegetables and decreasing simple carbohydrates.  Behavioral Intervention  We discussed the following Behavioral Modification Strategies today: increasing lean protein intake to established goals, increasing fiber rich foods, increasing water intake , work on meal planning and preparation, keeping healthy foods at home, identifying sources and decreasing liquid calories, work on managing stress, creating time for self-care and relaxation, planning for success, and continue to work on maintaining a reduced calorie state, getting the recommended amount of protein, incorporating whole foods, making healthy choices, staying well hydrated and practicing mindfulness when eating..  Additional resources provided today: NA  Recommended Physical Activity Goals  [name] has been advised to work up to 150 minutes of moderate intensity aerobic activity a week and strengthening exercises 2-3 times per week for cardiovascular health, weight loss maintenance and preservation of muscle mass.   She has agreed to Think about enjoyable ways to increase daily physical activity and overcoming barriers to exercise and Increase physical activity in their day and reduce sedentary time (increase NEAT).  Pharmacotherapy changes for the treatment of obesity: None  ASSOCIATED CONDITIONS ADDRESSED TODAY  Type 2 diabetes mellitus without complication, without long-term current use of insulin (HCC) Assessment & Plan: She has done well with new start Ozempic currently at 0.5 mg once weekly injection with improved food volumes at mealtime, avoiding meal skipping, making better food choices with a focus on  lean protein and fiber with meals and snacks.  She denies nausea, heartburn or constipation.  She has done well with 10 pounds of weight loss in the past 7 weeks of medically supervised weight management.  She  plans to increase walking time once she has completed her January surgery.  Continue Ozempic at 0.5 mg once weekly injection.  She will pause her Ozempic once again 03/12/2023 and resume at 03/26/2023 for upcoming breast reconstructive surgery  Orders: -     Semaglutide(0.25 or 0.5MG /DOS); Inject 0.5 mg into the skin once a week.  Dispense: 3 mL; Refill: 1  Class 2 severe obesity due to excess calories with serious comorbidity and body mass index (BMI) of 35.0 to 35.9 in adult (HCC)  IIH (idiopathic intracranial hypertension) Assessment & Plan: Headaches are starting to slowly improve.  She is status post right styloidectomy for jugular vein stenosis 12/5.  She is followed closely by neurosurgery at [facility].  We have previously discussed the role of bariatric surgery to potentially help her IIH but she is starting to see a reduction in headache frequency with the start of Ozempic.  Continue current plan of care per neurosurgery.  Continue Ozempic while actively working on improving nutrition, physical activity and weight reduction.   Other specified hypothyroidism Assessment & Plan: She has been taking her levothyroxine 137 mcg once daily and is due for recheck level next visit       She was informed of the importance of frequent follow up visits to maximize her success with intensive lifestyle modifications for her multiple health conditions.   ATTESTASTION STATEMENTS:  Reviewed by clinician on day of visit: allergies, medications, problem list, medical history, surgical history, family history, social history, and previous encounter notes pertinent to obesity diagnosis.   I have personally spent 30 minutes total time today in preparation, patient care, nutritional counseling and documentation for this visit, including the following: review of clinical lab tests; review of medical tests/procedures/services.      Glennis Brink, DO DABFM, DABOM [facility] [address] [place], [place] [address] [phone]

## 2023-03-09 NOTE — Telephone Encounter (Signed)
Called patient and notified her that unfortunately due to Dr. Cathey Endow giving her enough medication to last her for 30 days insurance isn't going to cover her prescription before time. She can pay out of pocket if she prefer, if not she has to wait until its time for that prescription. Patient verbalized understanding.

## 2023-03-12 ENCOUNTER — Other Ambulatory Visit (HOSPITAL_BASED_OUTPATIENT_CLINIC_OR_DEPARTMENT_OTHER): Payer: Self-pay

## 2023-03-12 MED ORDER — OXYCODONE HCL 10 MG PO TABS
10.0000 mg | ORAL_TABLET | ORAL | 0 refills | Status: DC | PRN
  Filled 2023-03-30: qty 180, 30d supply, fill #0

## 2023-03-12 MED ORDER — OXYCODONE HCL 10 MG PO TABS
10.0000 mg | ORAL_TABLET | Freq: Four times a day (QID) | ORAL | 0 refills | Status: DC | PRN
  Filled 2023-05-02: qty 120, 30d supply, fill #0

## 2023-03-18 ENCOUNTER — Encounter (HOSPITAL_BASED_OUTPATIENT_CLINIC_OR_DEPARTMENT_OTHER): Payer: Self-pay | Admitting: Plastic Surgery

## 2023-03-18 ENCOUNTER — Other Ambulatory Visit: Payer: Self-pay

## 2023-03-18 ENCOUNTER — Telehealth: Payer: Self-pay | Admitting: Plastic Surgery

## 2023-03-18 NOTE — Telephone Encounter (Signed)
 Surgery is on 1.15.2025, PT visit info says left side brst for surgery, the OR called and told her it was left side as well, PT stated its suppose to be both sides. She is on a medication that she stopped taking and that the OR informed her to tell the provider. Clinical staff please advise, surgery is next week.

## 2023-03-19 ENCOUNTER — Inpatient Hospital Stay: Payer: HMO | Attending: Adult Health | Admitting: Adult Health

## 2023-03-19 ENCOUNTER — Inpatient Hospital Stay: Payer: HMO | Admitting: Hematology and Oncology

## 2023-03-19 ENCOUNTER — Encounter: Payer: Self-pay | Admitting: Adult Health

## 2023-03-19 VITALS — BP 119/63 | HR 70 | Temp 97.9°F | Resp 16 | Wt 229.5 lb

## 2023-03-19 DIAGNOSIS — Z800 Family history of malignant neoplasm of digestive organs: Secondary | ICD-10-CM | POA: Insufficient documentation

## 2023-03-19 DIAGNOSIS — Z170 Estrogen receptor positive status [ER+]: Secondary | ICD-10-CM | POA: Diagnosis not present

## 2023-03-19 DIAGNOSIS — Z1732 Human epidermal growth factor receptor 2 negative status: Secondary | ICD-10-CM | POA: Diagnosis not present

## 2023-03-19 DIAGNOSIS — C50412 Malignant neoplasm of upper-outer quadrant of left female breast: Secondary | ICD-10-CM | POA: Insufficient documentation

## 2023-03-19 DIAGNOSIS — Z8049 Family history of malignant neoplasm of other genital organs: Secondary | ICD-10-CM | POA: Diagnosis not present

## 2023-03-19 DIAGNOSIS — Z1721 Progesterone receptor positive status: Secondary | ICD-10-CM | POA: Insufficient documentation

## 2023-03-19 DIAGNOSIS — Z803 Family history of malignant neoplasm of breast: Secondary | ICD-10-CM | POA: Diagnosis not present

## 2023-03-19 DIAGNOSIS — Z808 Family history of malignant neoplasm of other organs or systems: Secondary | ICD-10-CM | POA: Insufficient documentation

## 2023-03-19 DIAGNOSIS — Z87891 Personal history of nicotine dependence: Secondary | ICD-10-CM | POA: Insufficient documentation

## 2023-03-19 DIAGNOSIS — Z801 Family history of malignant neoplasm of trachea, bronchus and lung: Secondary | ICD-10-CM | POA: Diagnosis not present

## 2023-03-19 DIAGNOSIS — Z9013 Acquired absence of bilateral breasts and nipples: Secondary | ICD-10-CM | POA: Diagnosis not present

## 2023-03-19 NOTE — Progress Notes (Signed)
 SURVIVORSHIP VISIT:  BRIEF ONCOLOGIC HISTORY:  Oncology History  Primary malignant neoplasm of upper outer quadrant of female breast, left (HCC)  07/16/2022 Initial Diagnosis   Primary malignant neoplasm of upper outer quadrant of female breast, left (HCC)   07/16/2022 Cancer Staging   Staging form: Breast, AJCC 8th Edition - Clinical stage from 07/16/2022: Stage IA (cT1b, cN0, cM0, G1, ER+, PR+, HER2-) - Signed by Lanell Donald Stagger, PA-C on 07/16/2022 Stage prefix: Initial diagnosis Method of lymph node assessment: Clinical Histologic grading system: 3 grade system   07/28/2022 Genetic Testing   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  Report date is 07/28/2022.   The Multi-Cancer + RNA Panel offered by [facility] includes sequencing and/or deletion/duplication analysis of the following 70 genes:  AIP*, ALK, APC*, ATM*, AXIN2*, BAP1*, BARD1*, BLM*, BMPR1A*, BRCA1*, BRCA2*, BRIP1*, CDC73*, CDH1*, CDK4, CDKN1B*, CDKN2A, CHEK2*, CTNNA1*, DICER1*, EPCAM (del/dup only), EGFR, FH*, FLCN*, GREM1 (promoter dup only), HOXB13, KIT, LZTR1, MAX*, MBD4, MEN1*, MET, MITF, MLH1*, MSH2*, MSH3*, MSH6*, MUTYH*, NF1*, NF2*, NTHL1*, PALB2*, PDGFRA, PMS2*, POLD1*, POLE*, POT1*, PRKAR1A*, PTCH1*, PTEN*, RAD51C*, RAD51D*, RB1*, RET, SDHA* (sequencing only), SDHAF2*, SDHB*, SDHC*, SDHD*, SMAD4*, SMARCA4*, SMARCB1*, SMARCE1*, STK11*, SUFU*, TMEM127*, TP53*, TSC1*, TSC2*, VHL*. RNA analysis is performed for * genes.   08/25/2022 Cancer Staging   Staging form: Breast, AJCC 8th Edition - Pathologic stage from 08/25/2022: Stage IA (pT1b, pN0, cM0, G1, ER+, PR+, HER2-, Oncotype DX score: 10) - Signed by Crawford Morna Pickle, NP on 03/19/2023 Stage prefix: Initial diagnosis Multigene prognostic tests performed: Oncotype DX Recurrence score range: Less than 11 Histologic grading system: 3 grade system   08/25/2022 Surgery   Bilateral mastectomies: Left-grade 1 IDC, 8 x 6 x 6 mm, no residual DCIS, margins  negative, LN negative   08/25/2022 Oncotype testing   10/3%   09/26/2022 - 10/01/2022 Anti-estrogen oral therapy   Anastrozole  (discontinued)--opted to forego     INTERVAL HISTORY:   Discussed the use of AI scribe software for clinical note transcription with the patient, who gave verbal consent to proceed.  April Meyer to review her survivorship care plan detailing her treatment course for breast cancer, as well as monitoring long-term side effects of that treatment, education regarding health maintenance, screening, and overall wellness and health promotion.     Overall, April Meyer reports feeling moderately well.  She continues to heal from surgery/radiation and has some tiredness and hot flashes.    REVIEW OF SYSTEMS:  Review of Systems  Constitutional:  Positive for fatigue. Negative for appetite change, chills, fever and unexpected weight change.  HENT:   Negative for hearing loss, lump/mass and trouble swallowing.   Eyes:  Negative for eye problems and icterus.  Respiratory:  Negative for chest tightness, cough and shortness of breath.   Cardiovascular:  Negative for chest pain, leg swelling and palpitations.  Gastrointestinal:  Negative for abdominal distention, abdominal pain, constipation, diarrhea, nausea and vomiting.  Endocrine: Positive for hot flashes.  Genitourinary:  Negative for difficulty urinating.   Musculoskeletal:  Negative for arthralgias.  Skin:  Negative for itching and rash.  Neurological:  Negative for dizziness, extremity weakness, headaches and numbness.  Hematological:  Negative for adenopathy. Does not bruise/bleed easily.  Psychiatric/Behavioral:  Negative for depression. The patient is not nervous/anxious.   Breast: Denies any new nodularity, masses, tenderness, nipple changes, or nipple discharge.       PAST MEDICAL/SURGICAL HISTORY:  Past Medical History:  Diagnosis Date   ADHD (attention deficit hyperactivity disorder)  no meds for   Anxiety     Arnold-Chiari deformity (HCC)    Asthma    AVM (arteriovenous malformation) spine 04/21/2018   Back pain    Breast cancer (HCC) 07/10/2022   Left   Bruises easily    Cerebral venous sinus thrombosis, remote, resolved 03/28/2013   Chronic pain    Common migraine with intractable migraine 04/21/2018   Complication of anesthesia age 77    breast lumpectomy heart rate went way down in hospital stayed 2 days, no problems since   Complication of anesthesia    C 1 to C 3 fusion side to side and up and down limited    Constipation    Depression    EDS (Ehlers-Danlos syndrome)    Eustachian tube dysfunction    Fatigue    GERD (gastroesophageal reflux disease)    Glioma (HCC)    low graded tectal plate glioma   Hemorrhoids    History of sleep apnea    no cpap used last 2 years, normal sleep study 2 yrs ago   HSV infection    Hx of blood clots    Hypothyroid    Hypothyroidism    IIH (idiopathic intracranial hypertension)    Joint pain    Lactose intolerance    Lumbar disc herniation    Migraine    Neck stiffness    Neuromuscular disorder (HCC)    Ehlers-Danlos Syndrome   Nystagmus, positional, central type 03/28/2013   Other malaise and fatigue 03/28/2013    The patient reports feeling happy, ED, sore on she has undergone at Chiari malformation surgery decompression in 2011. Prior to the surgery were chief complaints were weakness numbness and neck problems. She had swallowing difficulties and dizziness;  Classic Chiari presentation. But she didn't have headaches.   Photophobia    Pneumonia    Pre-diabetes    Sleep apnea    does not use CPAP   SOB (shortness of breath)    Tinnitus of both ears mainly in left ear    Urinary incontinence    Vision disturbance    Vitamin D  deficiency    Weakness    Wears glasses    Past Surgical History:  Procedure Laterality Date   ARNOLD CHIARI REPAIR  2010   BLADDER SUSPENSION N/A 11/21/2021   Procedure: SINGLE INCISION SLING  PROCEDURE (Altis Sling);  Surgeon: Marilynne Rosaline SAILOR, MD;  Location: [facility];  Service: Gynecology;  Laterality: N/A;   Brain shunt  12/30/2019   vp shunt   BRAIN SURGERY     BREAST BIOPSY Left    lumpectomy age 38    BREAST BIOPSY Left 07/10/2022   US  LT BREAST BX W LOC DEV 1ST LESION IMG BX SPEC US  GUIDE 07/10/2022 [facility]   BREAST EXCISIONAL BIOPSY Right yrs ago   CHOLECYSTECTOMY  age 58   laparoscopic   colonscopy  2012, 2018   CYSTOSCOPY N/A 11/21/2021   Procedure: CYSTOSCOPY;  Surgeon: Marilynne Rosaline SAILOR, MD;  Location: [facility];  Service: Gynecology;  Laterality: N/A;   DILATION AND CURETTAGE OF UTERUS  last done yrs ago   x 2 or 3   ENDOMETRIAL ABLATION  02/09/2013   [facility]   ESOPHAGOGASTRODUODENOSCOPY N/A 02/05/2021   Procedure: ESOPHAGOGASTRODUODENOSCOPY (EGD);  Surgeon: Rollin Dover, MD;  Location: [facility];  Service: Endoscopy;  Laterality: N/A;   HYSTEROSCOPY WITH D & C N/A 06/26/2020   Procedure: DILATATION AND CURETTAGE /HYSTEROSCOPY;  Surgeon: Jannis Kate Norris, MD;  Location: [facility];  Service: Gynecology;  Laterality: N/A;   INTRAUTERINE DEVICE INSERTION  10/2010   Mirena    IUD REMOVAL  11/2010   could not tolerate hormonal side effects   MASTECTOMY W/ SENTINEL NODE BIOPSY Left 08/25/2022   Procedure: LEFT MASTECTOMY WITH SENTINEL LYMPH NODE BIOPSY;  Surgeon: Curvin Deward MOULD, MD;  Location: [facility];  Service: General;  Laterality: Left;   metal plate removed from head  2015   screw came loose   neck fusion c1-c3  2010   OPERATIVE ULTRASOUND N/A 06/26/2020   Procedure: OPERATIVE ULTRASOUND;  Surgeon: Jannis Kate Norris, MD;  Location: [facility];  Service: Gynecology;  Laterality: N/A;   SIMPLE MASTECTOMY WITH AXILLARY SENTINEL NODE BIOPSY Right 08/25/2022   Procedure: RIGHT SIMPLE MASTECTOMY;  Surgeon: Curvin Deward MOULD, MD;  Location: [facility];  Service: General;  Laterality: Right;    TONSILLECTOMY AND ADENOIDECTOMY  age 59   UPPER GI ENDOSCOPY  2012, 2018   XI ROBOTIC ASSISTED TOTAL HYSTERECTOMY WITH SACROCOLPOPEXY N/A 11/21/2021   Procedure: XI ROBOTIC ASSISTED TOTAL HYSTERECTOMY WITH BILATERAL SALPINGECTOMY  AND SACROCOLPOPEXY;  Surgeon: Marilynne Rosaline SAILOR, MD;  Location: [facility];  Service: Gynecology;  Laterality: N/A;  TOTAL TIME REQUESTED IS 3.5HRS     ALLERGIES:  Allergies  Allergen Reactions   Advair Hfa [Fluticasone-Salmeterol] Anaphylaxis   Other Anaphylaxis    Steroids and Zucchini    Prednisone Anaphylaxis   Morphine And Codeine Itching    Severe     CURRENT MEDICATIONS:  Outpatient Encounter Medications as of 03/19/2023  Medication Sig   acetaminophen  (TYLENOL ) 500 MG tablet Take 1 tablet (500 mg total) by mouth every 6 (six) hours as needed (pain).   acetaminophen  (TYLENOL ) 500 MG tablet Take 500 mg by mouth every 6 (six) hours as needed for moderate pain or fever.   albuterol  (VENTOLIN  HFA) 108 (90 Base) MCG/ACT inhaler Inhale 1-2 puffs into the lungs every 4 (four) hours as needed for wheezing or shortness of breath.   busPIRone  (BUSPAR ) 7.5 MG tablet Take 1 tablet (7.5 mg total) by mouth 2 (two) times daily.   cyclobenzaprine  (FLEXERIL ) 5 MG tablet Take 1 tablet (5 mg total) by mouth 3 (three) times daily as needed.   cyclobenzaprine  (FLEXERIL ) 5 MG tablet Take 1 tablet (5 mg total) by mouth 3 (three) times daily as needed.   dexlansoprazole (DEXILANT) 60 MG capsule Take 60 mg by mouth at bedtime.   gabapentin  (NEURONTIN ) 100 MG capsule Take 1 capsule (100 mg total) by mouth at bedtime. (Patient taking differently: Take 100 mg by mouth at bedtime as needed (hot flashes).)   ibuprofen  (ADVIL ) 200 MG tablet Take 200 mg by mouth every 6 (six) hours as needed for fever or moderate pain.   ibuprofen  (ADVIL ) 600 MG tablet Take 1 tablet (600 mg total) by mouth every 6 (six) hours as needed.   ipratropium-albuterol  (DUONEB) 0.5-2.5 (3) MG/3ML SOLN Take 3  mLs by nebulization every 6 (six) hours as needed (sob/wheezing).   lamoTRIgine  (LAMICTAL ) 25 MG tablet Take 1 tablet by mouth daily.   loratadine  (CLARITIN ) 10 MG tablet Take 10 mg by mouth daily.   Menthol, Topical Analgesic, (BIOFREEZE EX) Apply 1 Application topically daily as needed (pain).   ondansetron  (ZOFRAN ) 4 MG tablet Take 1 tablet (4 mg total) by mouth every 8 (eight) hours as needed for nausea or vomiting.   ondansetron  (ZOFRAN ) 8 MG tablet Take 8 mg by mouth every 8 (eight)  hours as needed for nausea or vomiting.   Oxycodone  HCl 10 MG TABS Take 1 tablet (10 mg total) by mouth 4 (four) times daily as needed for pain   Oxycodone  HCl 10 MG TABS Take 1 tablet (10 mg total) by mouth 4 (four) times daily as needed. For pain   Oxycodone  HCl 10 MG TABS Take 1 tablet (10 mg total) by mouth 4 (four) times daily as needed. For pain *10/23/22*   Oxycodone  HCl 10 MG TABS Take 1 tablet (10 mg total) by mouth 4 (four) times daily as needed for pain.   Oxycodone  HCl 10 MG TABS Take 1 tablet (10 mg total) by mouth 4 (four) times daily as needed for pain.   Oxycodone  HCl 10 MG TABS Take 1 tablet by mouth every four hours as needed for pain   Oxycodone  HCl 10 MG TABS Take 1 tablet (10 mg total) by mouth every 4 (four) hours as needed for pain.   [START ON 04/09/2023] Oxycodone  HCl 10 MG TABS Take 1 tablet (10 mg total) by mouth 4 (four) times daily as needed for pain.   polyethylene glycol powder (GLYCOLAX /MIRALAX ) 17 GM/SCOOP powder Take 17 g by mouth daily. Drink 17g (1 scoop) dissolved in water  per day. (Patient taking differently: Take 17 g by mouth daily as needed for moderate constipation. Drink 17g (1 scoop) dissolved in water )   promethazine  (PHENERGAN ) 12.5 MG tablet Take 1 tablet (12.5 mg total) by mouth 3 (three) times daily as needed.   promethazine  (PHENERGAN ) 12.5 MG tablet Take 1 tablet by mouth three times a day as needed   rizatriptan  (MAXALT -MLT) 10 MG disintegrating tablet Take 10 mg  by mouth as needed for migraine.   Semaglutide ,0.25 or 0.5MG /DOS, 2 MG/3ML SOPN Inject 0.5 mg into the skin once a week.   sertraline  (ZOLOFT ) 25 MG tablet Take 1 tablet (25 mg total) by mouth daily.   SYNTHROID  137 MCG tablet Take 137 mcg by mouth every morning.   UNABLE TO FIND Take 1-2 tablets by mouth 2 (two) times daily as needed (sleep/pain). Cbd gummies   valACYclovir  (VALTREX ) 1000 MG tablet TAKE 2 TABS (2 GRAMS) BY MOUTH NOW AND REPEAT IN 12 HOURS X 1, AS NEEDED FOR COLDSORE (Patient taking differently: Take 1,000-2,000 mg by mouth daily as needed (cold sores). TAKE 2 TABS (2 GRAMS) BY MOUTH NOW AND REPEAT IN 12 HOURS X 1, AS NEEDED FOR COLDSORE)   [DISCONTINUED] exemestane  (AROMASIN ) 25 MG tablet Take 1 tablet (25 mg total) by mouth daily after breakfast. (Patient not taking: Reported on 03/18/2023)   No facility-administered encounter medications on file as of 03/19/2023.     ONCOLOGIC FAMILY HISTORY:  Family History  Problem Relation Age of Onset   Aortic dissection Father    Cancer Father        GI; dx after 53   Melanoma Father        dx after 52   Breast cancer Sister 31       bilateral   Diabetes Maternal Aunt    Breast cancer Maternal Aunt        x3 mat aunts; dx after 57; one w/ multiple primaries   Uterine cancer Maternal Aunt        dx 69s   Diabetes Maternal Uncle    Breast cancer Paternal Aunt 45   Lung cancer Paternal Aunt    Pancreatic cancer Paternal Uncle 59   Diabetes Maternal Grandmother    Cancer Maternal Grandmother  skin and lung; dx after 50   Diabetes Paternal Grandmother    Breast cancer Other        MGF's mother; dx after 62   Rectal cancer Cousin        d. 65s   Brain cancer Cousin      SOCIAL HISTORY:  Social History   Socioeconomic History   Marital status: Divorced    Spouse name: Not on file   Number of children: 1   Years of education: 16   Highest education level: Not on file  Occupational History    Employer: OTHER   Tobacco Use   Smoking status: Former    Types: Cigarettes   Smokeless tobacco: Never  Vaping Use   Vaping status: Never Used  Substance and Sexual Activity   Alcohol use: No    Comment: h/o binge drinking none in 10 years   Drug use: Yes    Types: Marijuana    Comment: daily use of marijuana recently-last smoked 03-18-23   Sexual activity: Not Currently    Partners: Male    Birth control/protection: Other-see comments, Surgical    Comment: hyst  Other Topics Concern   Not on file  Social History Narrative   Patient is divorced and lives at home with her one child.   Patient is disabled.   Patient is right-handed.   Patient has a college education.   Patient drinks one cup of coffee daily.   Social Drivers of Corporate Investment Banker Strain: Not on file  Food Insecurity: Low Risk  (02/12/2023)   Received from [facility]   Hunger Vital Sign    Worried About Running Out of Food in the Last Year: Never true    Ran Out of Food in the Last Year: Never true  Transportation Needs: No Transportation Needs (02/12/2023)   Received from [facility]    In the past 12 months, has lack of reliable transportation kept you from medical appointments, meetings, work or from getting things needed for daily living? : No  Physical Activity: Not on file  Stress: Not on file  Social Connections: Not on file  Intimate Partner Violence: Not At Risk (07/18/2022)   Humiliation, Afraid, Rape, and Kick questionnaire    Fear of Current or Ex-Partner: No    Emotionally Abused: No    Physically Abused: No    Sexually Abused: No     OBSERVATIONS/OBJECTIVE:  BP 119/63 (BP Location: Right Arm, Patient Position: Sitting)   Pulse 70   Temp 97.9 F (36.6 C) (Temporal)   Resp 16   Wt 229 lb 8 oz (104.1 kg)   LMP 08/10/2020   SpO2 97%   BMI 35.94 kg/m  GENERAL: Patient is a well appearing female in no acute distress HEENT:  Sclerae anicteric.  Oropharynx clear and moist. No  ulcerations or evidence of oropharyngeal candidiasis. Neck is supple.  NODES:  No cervical, supraclavicular, or axillary lymphadenopathy palpated.  BREAST EXAM:  s/p bilateral mastectomies no sign of local recurrence LUNGS:  Clear to auscultation bilaterally.  No wheezes or rhonchi. HEART:  Regular rate and rhythm. No murmur appreciated. ABDOMEN:  Soft, nontender.  Positive, normoactive bowel sounds. No organomegaly palpated. MSK:  No focal spinal tenderness to palpation. Full range of motion bilaterally in the upper extremities. EXTREMITIES:  No peripheral edema.   SKIN:  Clear with no obvious rashes or skin changes. No nail dyscrasia. NEURO:  Nonfocal. Well oriented.  Appropriate affect.  LABORATORY DATA:  None for this visit.  DIAGNOSTIC IMAGING:  None for this visit.      ASSESSMENT AND PLAN:  Ms.. Meyer is a pleasant 58 y.o. female with Stage IA left breast invasive ductal carcinoma, ER+/PR+/HER2-, diagnosed in 07/2022, treated with bilateral mastectomies, unable to take AET.  She presents to the Survivorship Clinic for our initial meeting and routine follow-up post-completion of treatment for breast cancer.    1. Stage IA left breast cancer:  April Meyer is continuing to recover from definitive treatment for breast cancer. She will follow-up with her medical oncologist, Dr.  Loretha in 6 months with history and physical exam per surveillance protocol.  Today, a comprehensive survivorship care plan and treatment summary was reviewed with the patient today detailing her breast cancer diagnosis, treatment course, potential late/long-term effects of treatment, appropriate follow-up care with recommendations for the future, and patient education resources.  A copy of this summary, along with a letter will be sent to the patient's primary care provider via mail/fax/In Basket message after today's visit.    2. Bone health:   She was given education on specific activities to promote bone  health.  3. Cancer screening:  Due to April Meyer's history and her age, she should receive screening for skin cancers, colon cancer, and gynecologic cancers.  The information and recommendations are listed on the patient's comprehensive care plan/treatment summary and were reviewed in detail with the patient.    4. Health maintenance and wellness promotion: April Meyer was encouraged to consume 5-7 servings of fruits and vegetables per day. We reviewed the Nutrition Rainbow handout.  She was also encouraged to engage in moderate to vigorous exercise for 30 minutes per day most days of the week.  She was instructed to limit her alcohol consumption and continue to abstain from tobacco use.     5. Support services/counseling: It is not uncommon for this period of the patient's cancer care trajectory to be one of many emotions and stressors.   She was given information regarding our available services and encouraged to contact me with any questions or for help enrolling in any of our support group/programs.    Follow up instructions:    -Return to cancer center in 6 months for f/u with Dr. Loretha -She is welcome to return back to the Survivorship Clinic at any time; no additional follow-up needed at this time.  -Consider referral back to survivorship as a long-term survivor for continued surveillance  The patient was provided an opportunity to ask questions and all were answered. The patient agreed with the plan and demonstrated an understanding of the instructions.   Total encounter time:30 minutes*in face-to-face visit time, chart review, lab review, care coordination, order entry, and documentation of the encounter time.    Morna Kendall, NP 03/23/23 12:09 PM Medical Oncology and Hematology [facility] [address] [place], [place] [address] Tel. [phone]    Fax. [phone]  *Total Encounter Time as defined by the Centers for Medicare and Medicaid Services includes, in  addition to the face-to-face time of a patient visit (documented in the note above) non-face-to-face time: obtaining and reviewing outside history, ordering and reviewing medications, tests or procedures, care coordination (communications with other health care professionals or caregivers) and documentation in the medical record.

## 2023-03-24 ENCOUNTER — Encounter (HOSPITAL_BASED_OUTPATIENT_CLINIC_OR_DEPARTMENT_OTHER)
Admission: RE | Admit: 2023-03-24 | Discharge: 2023-03-24 | Disposition: A | Discharge status: Home / Self Care | Payer: PPO | Source: Ambulatory Visit | Attending: Plastic Surgery | Admitting: Plastic Surgery

## 2023-03-24 DIAGNOSIS — Z170 Estrogen receptor positive status [ER+]: Secondary | ICD-10-CM | POA: Diagnosis not present

## 2023-03-24 DIAGNOSIS — L987 Excessive and redundant skin and subcutaneous tissue: Secondary | ICD-10-CM | POA: Diagnosis present

## 2023-03-24 DIAGNOSIS — Z7985 Long-term (current) use of injectable non-insulin antidiabetic drugs: Secondary | ICD-10-CM | POA: Diagnosis not present

## 2023-03-24 DIAGNOSIS — Z87891 Personal history of nicotine dependence: Secondary | ICD-10-CM | POA: Diagnosis not present

## 2023-03-24 DIAGNOSIS — J45909 Unspecified asthma, uncomplicated: Secondary | ICD-10-CM | POA: Diagnosis not present

## 2023-03-24 DIAGNOSIS — Z86718 Personal history of other venous thrombosis and embolism: Secondary | ICD-10-CM | POA: Diagnosis not present

## 2023-03-24 DIAGNOSIS — Z9013 Acquired absence of bilateral breasts and nipples: Secondary | ICD-10-CM | POA: Diagnosis not present

## 2023-03-24 DIAGNOSIS — E119 Type 2 diabetes mellitus without complications: Secondary | ICD-10-CM | POA: Diagnosis not present

## 2023-03-24 DIAGNOSIS — G8929 Other chronic pain: Secondary | ICD-10-CM | POA: Diagnosis not present

## 2023-03-24 DIAGNOSIS — Z7984 Long term (current) use of oral hypoglycemic drugs: Secondary | ICD-10-CM | POA: Diagnosis not present

## 2023-03-24 DIAGNOSIS — Z853 Personal history of malignant neoplasm of breast: Secondary | ICD-10-CM | POA: Diagnosis not present

## 2023-03-24 DIAGNOSIS — K219 Gastro-esophageal reflux disease without esophagitis: Secondary | ICD-10-CM | POA: Diagnosis not present

## 2023-03-24 LAB — BASIC METABOLIC PANEL
Anion gap: 8 (ref 5–15)
BUN: 14 mg/dL (ref 6–20)
CO2: 25 mmol/L (ref 22–32)
Calcium: 9.3 mg/dL (ref 8.9–10.3)
Chloride: 104 mmol/L (ref 98–111)
Creatinine, Ser: 0.84 mg/dL (ref 0.44–1.00)
GFR, Estimated: >60 mL/min (ref >60)
Glucose, Bld: 88 mg/dL (ref 70–99)
Potassium: 4.5 mmol/L (ref 3.5–5.1)
Sodium: 137 mmol/L (ref 135–145)

## 2023-03-25 ENCOUNTER — Ambulatory Visit (HOSPITAL_BASED_OUTPATIENT_CLINIC_OR_DEPARTMENT_OTHER)
Admission: RE | Admit: 2023-03-25 | Discharge: 2023-03-25 | Disposition: A | Discharge status: Home / Self Care | Payer: PPO | Attending: Plastic Surgery | Admitting: Plastic Surgery

## 2023-03-25 ENCOUNTER — Encounter (HOSPITAL_BASED_OUTPATIENT_CLINIC_OR_DEPARTMENT_OTHER)
Admission: RE | Disposition: A | Discharge status: Home / Self Care | Payer: Self-pay | Source: Home / Self Care | Attending: Plastic Surgery

## 2023-03-25 ENCOUNTER — Other Ambulatory Visit: Payer: Self-pay

## 2023-03-25 ENCOUNTER — Ambulatory Visit (HOSPITAL_BASED_OUTPATIENT_CLINIC_OR_DEPARTMENT_OTHER): Payer: PPO | Admitting: Anesthesiology

## 2023-03-25 ENCOUNTER — Encounter (HOSPITAL_BASED_OUTPATIENT_CLINIC_OR_DEPARTMENT_OTHER): Payer: Self-pay | Admitting: Plastic Surgery

## 2023-03-25 DIAGNOSIS — Z86718 Personal history of other venous thrombosis and embolism: Secondary | ICD-10-CM | POA: Insufficient documentation

## 2023-03-25 DIAGNOSIS — J45909 Unspecified asthma, uncomplicated: Secondary | ICD-10-CM | POA: Insufficient documentation

## 2023-03-25 DIAGNOSIS — Z87891 Personal history of nicotine dependence: Secondary | ICD-10-CM | POA: Insufficient documentation

## 2023-03-25 DIAGNOSIS — Z170 Estrogen receptor positive status [ER+]: Secondary | ICD-10-CM | POA: Insufficient documentation

## 2023-03-25 DIAGNOSIS — Z853 Personal history of malignant neoplasm of breast: Secondary | ICD-10-CM | POA: Insufficient documentation

## 2023-03-25 DIAGNOSIS — Z7985 Long-term (current) use of injectable non-insulin antidiabetic drugs: Secondary | ICD-10-CM | POA: Insufficient documentation

## 2023-03-25 DIAGNOSIS — G8929 Other chronic pain: Secondary | ICD-10-CM | POA: Insufficient documentation

## 2023-03-25 DIAGNOSIS — G4733 Obstructive sleep apnea (adult) (pediatric): Secondary | ICD-10-CM

## 2023-03-25 DIAGNOSIS — Z7984 Long term (current) use of oral hypoglycemic drugs: Secondary | ICD-10-CM | POA: Insufficient documentation

## 2023-03-25 DIAGNOSIS — L987 Excessive and redundant skin and subcutaneous tissue: Secondary | ICD-10-CM | POA: Diagnosis not present

## 2023-03-25 DIAGNOSIS — C50212 Malignant neoplasm of upper-inner quadrant of left female breast: Secondary | ICD-10-CM | POA: Diagnosis not present

## 2023-03-25 DIAGNOSIS — K219 Gastro-esophageal reflux disease without esophagitis: Secondary | ICD-10-CM | POA: Insufficient documentation

## 2023-03-25 DIAGNOSIS — J449 Chronic obstructive pulmonary disease, unspecified: Secondary | ICD-10-CM | POA: Diagnosis not present

## 2023-03-25 DIAGNOSIS — Z9013 Acquired absence of bilateral breasts and nipples: Secondary | ICD-10-CM | POA: Insufficient documentation

## 2023-03-25 DIAGNOSIS — N6489 Other specified disorders of breast: Secondary | ICD-10-CM

## 2023-03-25 DIAGNOSIS — E119 Type 2 diabetes mellitus without complications: Secondary | ICD-10-CM

## 2023-03-25 HISTORY — PX: BREAST CYST EXCISION: SHX579

## 2023-03-25 LAB — GLUCOSE, CAPILLARY
Glucose-Capillary: 87 mg/dL (ref 70–99)
Glucose-Capillary: 87 mg/dL (ref 70–99)

## 2023-03-25 SURGERY — EXCISION, CYST, BREAST
Anesthesia: General | Site: Chest | Laterality: Bilateral

## 2023-03-25 MED ORDER — MIDAZOLAM HCL 5 MG/5ML IJ SOLN
INTRAMUSCULAR | Status: DC | PRN
  Administered 2023-03-25: 2 mg via INTRAVENOUS

## 2023-03-25 MED ORDER — FENTANYL CITRATE (PF) 100 MCG/2ML IJ SOLN
INTRAMUSCULAR | Status: DC | PRN
  Administered 2023-03-25: 100 ug via INTRAVENOUS

## 2023-03-25 MED ORDER — OXYCODONE HCL 5 MG PO TABS: 5.0000 mg | ORAL_TABLET | Freq: Once | ORAL | Status: DC | PRN

## 2023-03-25 MED ORDER — KETAMINE HCL 10 MG/ML IJ SOLN
INTRAMUSCULAR | Status: DC | PRN
  Administered 2023-03-25: 20 mg via INTRAVENOUS
  Administered 2023-03-25: 30 mg via INTRAVENOUS

## 2023-03-25 MED ORDER — DEXMEDETOMIDINE HCL IN NACL 80 MCG/20ML IV SOLN
INTRAVENOUS | Status: DC | PRN
  Administered 2023-03-25: 8 ug via INTRAVENOUS

## 2023-03-25 MED ORDER — CHLORHEXIDINE GLUCONATE CLOTH 2 % EX PADS: 6.0000 | MEDICATED_PAD | Freq: Once | CUTANEOUS | Status: DC

## 2023-03-25 MED ORDER — MIDAZOLAM HCL 2 MG/2ML IJ SOLN
INTRAMUSCULAR | Status: AC
  Filled 2023-03-25: qty 2

## 2023-03-25 MED ORDER — SODIUM CHLORIDE 0.9 % IV SOLN: INTRAVENOUS | Status: DC | PRN

## 2023-03-25 MED ORDER — ACETAMINOPHEN 500 MG PO TABS
1000.0000 mg | ORAL_TABLET | Freq: Once | ORAL | Status: AC
Start: 2023-03-25 — End: 2023-03-25
  Administered 2023-03-25: 1000 mg via ORAL

## 2023-03-25 MED ORDER — CEFAZOLIN SODIUM-DEXTROSE 2-4 GM/100ML-% IV SOLN
INTRAVENOUS | Status: AC
Start: 2023-03-25 — End: ?
  Filled 2023-03-25: qty 100

## 2023-03-25 MED ORDER — LIDOCAINE-EPINEPHRINE 1 %-1:100000 IJ SOLN
INTRAMUSCULAR | Status: DC | PRN
  Administered 2023-03-25: 40 mL

## 2023-03-25 MED ORDER — FENTANYL CITRATE (PF) 100 MCG/2ML IJ SOLN
INTRAMUSCULAR | Status: AC
  Filled 2023-03-25: qty 2

## 2023-03-25 MED ORDER — 0.9 % SODIUM CHLORIDE (POUR BTL) OPTIME
TOPICAL | Status: DC | PRN
  Administered 2023-03-25: 1000 mL

## 2023-03-25 MED ORDER — LACTATED RINGERS IV SOLN: INTRAVENOUS | Status: DC

## 2023-03-25 MED ORDER — KETOROLAC TROMETHAMINE 30 MG/ML IJ SOLN: 30.0000 mg | Freq: Once | INTRAMUSCULAR | Status: DC | PRN

## 2023-03-25 MED ORDER — FENTANYL CITRATE (PF) 100 MCG/2ML IJ SOLN: 25.0000 ug | INTRAMUSCULAR | Status: DC | PRN

## 2023-03-25 MED ORDER — AMISULPRIDE (ANTIEMETIC) 5 MG/2ML IV SOLN: 10.0000 mg | Freq: Once | INTRAVENOUS | Status: DC | PRN

## 2023-03-25 MED ORDER — ACETAMINOPHEN 500 MG PO TABS
ORAL_TABLET | ORAL | Status: AC
  Filled 2023-03-25: qty 2

## 2023-03-25 MED ORDER — OXYCODONE HCL 5 MG/5ML PO SOLN: 5.0000 mg | Freq: Once | ORAL | Status: DC | PRN

## 2023-03-25 MED ORDER — CEFAZOLIN SODIUM-DEXTROSE 2-4 GM/100ML-% IV SOLN
2.0000 g | INTRAVENOUS | Status: AC
  Administered 2023-03-25: 2 g via INTRAVENOUS

## 2023-03-25 MED ORDER — EPHEDRINE SULFATE (PRESSORS) 50 MG/ML IJ SOLN
INTRAMUSCULAR | Status: DC | PRN
  Administered 2023-03-25: 10 mg via INTRAVENOUS

## 2023-03-25 MED ORDER — LIDOCAINE HCL (CARDIAC) PF 100 MG/5ML IV SOSY
PREFILLED_SYRINGE | INTRAVENOUS | Status: DC | PRN
  Administered 2023-03-25: 60 mg via INTRAVENOUS

## 2023-03-25 MED ORDER — PROPOFOL 10 MG/ML IV BOLUS
INTRAVENOUS | Status: DC | PRN
  Administered 2023-03-25: 250 mg via INTRAVENOUS

## 2023-03-25 SURGICAL SUPPLY — 62 items
BAND RUBBER #18 3X1/16 STRL (MISCELLANEOUS) IMPLANT
BENZOIN TINCTURE PRP APPL 2/3 (GAUZE/BANDAGES/DRESSINGS) IMPLANT
BINDER BREAST XLRG (GAUZE/BANDAGES/DRESSINGS) IMPLANT
BINDER BREAST XXLRG (GAUZE/BANDAGES/DRESSINGS) IMPLANT
BLADE CLIPPER SURG (BLADE) IMPLANT
BLADE KNIFE PERSONA 10 (BLADE) IMPLANT
BLADE SURG 15 STRL LF DISP TIS (BLADE) ×1 IMPLANT
BNDG ELASTIC 2INX 5YD STR LF (GAUZE/BANDAGES/DRESSINGS) IMPLANT
CANISTER SUCT 1200ML W/VALVE (MISCELLANEOUS) IMPLANT
CLEANER CAUTERY TIP PAD (MISCELLANEOUS) IMPLANT
COVER BACK TABLE 60X90IN (DRAPES) ×1 IMPLANT
COVER MAYO STAND STRL (DRAPES) ×1 IMPLANT
DERMABOND ADVANCED .7 DNX12 (GAUZE/BANDAGES/DRESSINGS) IMPLANT
DRAPE LAPAROSCOPIC ABDOMINAL (DRAPES) IMPLANT
DRAPE LAPAROTOMY 100X72 PEDS (DRAPES) IMPLANT
DRAPE U-SHAPE 76X120 STRL (DRAPES) IMPLANT
DRAPE UTILITY XL STRL (DRAPES) ×1 IMPLANT
DRESSING MEPILEX FLEX 4X4 (GAUZE/BANDAGES/DRESSINGS) IMPLANT
DRSG MEPILEX FLEX 4X4 (GAUZE/BANDAGES/DRESSINGS) ×4 IMPLANT
DRSG MEPILEX POST OP 4X8 (GAUZE/BANDAGES/DRESSINGS) IMPLANT
DRSG TEGADERM 2-3/8X2-3/4 SM (GAUZE/BANDAGES/DRESSINGS) IMPLANT
DRSG TEGADERM 4X4.75 (GAUZE/BANDAGES/DRESSINGS) IMPLANT
ELECT COATED BLADE 2.86 ST (ELECTRODE) IMPLANT
ELECT REM PT RETURN 9FT ADLT (ELECTROSURGICAL) ×1 IMPLANT
ELECTRODE REM PT RTRN 9FT ADLT (ELECTROSURGICAL) IMPLANT
GAUZE PAD ABD 8X10 STRL (GAUZE/BANDAGES/DRESSINGS) IMPLANT
GAUZE SPONGE 2X2 STRL 8-PLY (GAUZE/BANDAGES/DRESSINGS) IMPLANT
GAUZE SPONGE 4X4 12PLY STRL LF (GAUZE/BANDAGES/DRESSINGS) IMPLANT
GAUZE STRETCH 2X75IN STRL (MISCELLANEOUS) IMPLANT
GLOVE BIO SURGEON STRL SZ 6.5 (GLOVE) ×2 IMPLANT
GLOVE BIOGEL M STRL SZ7.5 (GLOVE) ×1 IMPLANT
GOWN STRL REUS W/ TWL LRG LVL3 (GOWN DISPOSABLE) ×2 IMPLANT
GOWN STRL REUS W/ TWL XL LVL3 (GOWN DISPOSABLE) ×1 IMPLANT
MAT PREVALON FULL STRYKER (MISCELLANEOUS) IMPLANT
NDL HYPO 25X1 1.5 SAFETY (NEEDLE) IMPLANT
NDL PRECISIONGLIDE 27X1.5 (NEEDLE) ×1 IMPLANT
NEEDLE HYPO 25X1 1.5 SAFETY (NEEDLE) ×1 IMPLANT
NEEDLE PRECISIONGLIDE 27X1.5 (NEEDLE) ×1 IMPLANT
NS IRRIG 1000ML POUR BTL (IV SOLUTION) IMPLANT
PACK BASIN DAY SURGERY FS (CUSTOM PROCEDURE TRAY) ×1 IMPLANT
PENCIL SMOKE EVACUATOR (MISCELLANEOUS) ×1 IMPLANT
SHEET MEDIUM DRAPE 40X70 STRL (DRAPES) IMPLANT
SLEEVE SCD COMPRESS KNEE MED (STOCKING) IMPLANT
SPIKE FLUID TRANSFER (MISCELLANEOUS) IMPLANT
STRIP CLOSURE SKIN 1/2X4 (GAUZE/BANDAGES/DRESSINGS) IMPLANT
STRIP SUTURE WOUND CLOSURE 1/2 (MISCELLANEOUS) IMPLANT
SUT MNCRL AB 3-0 PS2 18 (SUTURE) IMPLANT
SUT MNCRL AB 4-0 PS2 18 (SUTURE) IMPLANT
SUT MON AB 5-0 PS2 18 (SUTURE) IMPLANT
SUT PDS 3-0 CT2 (SUTURE) ×3 IMPLANT
SUT PDS II 3-0 CT2 27 ABS (SUTURE) IMPLANT
SUT PROLENE 5 0 P 3 (SUTURE) IMPLANT
SUT PROLENE 5 0 PS 2 (SUTURE) IMPLANT
SUT VIC AB 4-0 PS2 18 (SUTURE) IMPLANT
SUT VIC AB 5-0 PS2 18 (SUTURE) IMPLANT
SYR 10ML LL (SYRINGE) IMPLANT
SYR BULB EAR ULCER 3OZ GRN STR (SYRINGE) IMPLANT
SYR BULB IRRIG 60ML STRL (SYRINGE) IMPLANT
SYR CONTROL 10ML LL (SYRINGE) ×1 IMPLANT
TOWEL GREEN STERILE FF (TOWEL DISPOSABLE) ×1 IMPLANT
TRAY DSU PREP LF (CUSTOM PROCEDURE TRAY) ×1 IMPLANT
TUBE CONNECTING 20X1/4 (TUBING) IMPLANT

## 2023-03-25 NOTE — Op Note (Signed)
 DATE OF OPERATION: 03/25/2023  LOCATION: [facility]  PREOPERATIVE DIAGNOSIS: Bilateral excess breast tissue after mastectomies  POSTOPERATIVE DIAGNOSIS: Same  PROCEDURE: Layered closure after: Excision of right breast medial excess breast tissue 2 x 5 cm Excision of right breast lateral excess breast tissue 2 x 5 cm Excision of left breast medial excess breast tissue 2 x 5 cm Excision of left breast lateral excess breast tissue 2 x 5 cm  SURGEON: Gilles Lacks, DO  ASSISTANT: Perrin Brakeman, PA  EBL: none  CONDITION: Stable  COMPLICATIONS: None  INDICATION: The patient, April Meyer, is a 58 y.o. female born on January 12, 1966, is here for treatment of bilateral excess breast tissue that was visible in clothing creating issues.   PROCEDURE DETAILS:  The patient was seen prior to surgery and marked.  The IV antibiotics were given. The patient was taken to the operating room and given a general anesthetic. A standard time out was performed and all information was confirmed by those in the room. SCDs were placed.   The patients chest was prepped and draped.  The medial and lateral breasts were injected with local for intraoperative hemostasis and postoperative pain control.    Right:  The medial breast excess tissue was measured and was 2 x 5 cm in size.  The #10 blade was used to excise the tissue.  The bovie was used to obtain hemostasis.  The helped improved the contour of the chest wall.  The deep layer was closed with the 3-0 PDS.  The 3-0 Monocryl and the 4-0 Monocryl were used to close the skin and soft tissue layers.   The lateral breast excess tissue was measured and was 2 x 5 cm in size.  The #10 blade was used to excise the tissue.  The bovie was used to obtain hemostasis.  The helped improved the contour of the chest wall.  The deep layer was closed with the 3-0 PDS.  The 3-0 Monocryl and the 4-0 Monocryl were used to close the skin and soft tissue layers.     Left:  The medial breast excess tissue was measured and was 2 x 5 cm in size.  The #10 blade was used to excise the tissue.  The bovie was used to obtain hemostasis.  The helped improved the contour of the chest wall.  The deep layer was closed with the 3-0 PDS.  The 3-0 Monocryl and the 4-0 Monocryl were used to close the skin and soft tissue layers.   The lateral breast excess tissue was measured and was 2 x 5 cm in size.  The #10 blade was used to excise the tissue.  The bovie was used to obtain hemostasis.  The helped improved the contour of the chest wall.  The deep layer was closed with the 3-0 PDS.  The 3-0 Monocryl and the 4-0 Monocryl were used to close the skin and soft tissue layers.  Derma bond and steri strips were applied to the incision sites.  Sterile dressings were applied. The patient was allowed to wake up and taken to recovery room in stable condition at the end of the case. The family was notified at the end of the case.  Tissue was sent to pathology.  The advanced practice practitioner (APP) assisted throughout the case.  The APP was essential in retraction and counter traction when needed to make the case progress smoothly.  This retraction and assistance made it possible to see the tissue plans for the procedure.  The assistance was needed for blood control, tissue re-approximation and assisted with closure of the incision site.

## 2023-03-25 NOTE — Discharge Instructions (Addendum)
 INSTRUCTIONS FOR AFTER BREAST SURGERY   You will likely have some questions about what to expect following your operation.  The following information will help you and your family understand what to expect when you are discharged from the hospital.  It is important to follow these guidelines to help ensure a smooth recovery and reduce complication.  Postoperative instructions include information on: diet, wound care, medications and physical activity.  AFTER SURGERY Expect to go home after the procedure.  In some cases, you may need to spend one night in the hospital for observation.  DIET Breast surgery does not require a specific diet.  However, the healthier you eat the better your body will heal. It is important to increasing your protein intake.  This means limiting the foods with sugar and carbohydrates.  Focus on vegetables and some meat.  If you have liposuction during your procedure be sure to drink water .  If your urine is bright yellow, then it is concentrated, and you need to drink more water .  As a general rule after surgery, you should have 8 ounces of water  every hour while awake.  If you find you are persistently nauseated or unable to take in liquids let us  know.  NO TOBACCO USE or EXPOSURE.  This will slow your healing process and lead to a wound.  WOUND CARE Leave the binder on for 3 days . Use fragrance free soap like Dial, Dove or [place].   After 3 days you can remove the binder to shower. Once dry apply binder or sports bra. If you have liposuction you will have a soft and spongy dressing (Lipofoam) that helps prevent creases in your skin.  Remove before you shower and then replace it.  It is also available on [employer]. If you have steri-strips / tape directly attached to your skin leave them in place. It is OK to get these wet.   No baths, pools or hot tubs for four weeks. We close your incision to leave the smallest and best-looking scar. No ointment or creams on your incisions  for four weeks.  No Neosporin (Too many skin reactions).  A few weeks after surgery you can use Mederma and start massaging the scar. We ask you to wear your binder or sports bra for the first 6 weeks around the clock, including while sleeping. This provides added comfort and helps reduce the fluid accumulation at the surgery site. NO Ice or heating pads to the operative site.  You have a very high risk of a BURN before you feel the temperature change.  ACTIVITY No heavy lifting until cleared by the doctor.  This usually means no more than a half-gallon of milk.  It is OK to walk and climb stairs. Moving your legs is very important to decrease your risk of a blood clot.  It will also help keep you from getting deconditioned.  Every 1 to 2 hours get up and walk for 5 minutes. This will help with a quicker recovery back to normal.  Let pain be your guide so you don't do too much.  This time is for you to recover.  You will be more comfortable if you sleep and rest with your head elevated either with a few pillows under you or in a recliner.  No stomach sleeping for a three months.  WORK Everyone returns to work at different times. As a rough guide, most people take at least 1 - 2 weeks off prior to returning to work. If  you need documentation for your job, give the forms to the front staff at the clinic.  DRIVING Arrange for someone to bring you home from the hospital after your surgery.  You may be able to drive a few days after surgery but not while taking any narcotics or valium .  BOWEL MOVEMENTS Constipation can occur after anesthesia and while taking pain medication.  It is important to stay ahead for your comfort.  We recommend taking Milk of Magnesia (2 tablespoons; twice a day) while taking the pain pills.  MEDICATIONS You may be prescribed should start after surgery At your preoperative visit for you history and physical you may have been given the following medications: An antibiotic: Start  this medication when you get home and take according to the instructions on the bottle. Zofran  4 mg:  This is to treat nausea and vomiting.  You can take this every 6 hours as needed and only if needed. Valium  2 mg for breast cancer patients: This is for muscle tightness if you have an implant or expander. This will help relax your muscle which also helps with pain control.  This can be taken every 12 hours as needed. Don't drive after taking this medication. Norco (hydrocodone /acetaminophen ) 5/325 mg:  This is only to be used after you have taken the Motrin  or the Tylenol . Every 8 hours as needed.   Over the counter Medication to take: Ibuprofen  (Motrin ) 600 mg:  Take this every 6 hours.  If you have additional pain then take 500 mg of the Tylenol  every 8 hours.  Only take the Norco after you have tried these two. MiraLAX  or Milk of Magnesia: Take this according to the bottle if you take the Norco.  WHEN TO CALL Call your surgeon's office if any of the following occur: Fever 101 degrees F or greater Excessive bleeding or fluid from the incision site. Pain that increases over time without aid from the medications Redness, warmth, or pus draining from incision sites Persistent nausea or inability to take in liquids Severe misshapen area that underwent the operation.   Post Anesthesia Home Care Instructions  Activity: Get plenty of rest for the remainder of the day. A responsible individual must stay with you for 24 hours following the procedure.  For the next 24 hours, DO NOT: -Drive a car -Advertising copywriter -Drink alcoholic beverages -Take any medication unless instructed by your physician -Make any legal decisions or sign important papers.  Meals: Start with liquid foods such as gelatin or soup. Progress to regular foods as tolerated. Avoid greasy, spicy, heavy foods. If nausea and/or vomiting occur, drink only clear liquids until the nausea and/or vomiting subsides. Call your  physician if vomiting continues.  Special Instructions/Symptoms: Your throat may feel dry or sore from the anesthesia or the breathing tube placed in your throat during surgery. If this causes discomfort, gargle with warm salt water . The discomfort should disappear within 24 hours.  If you had a scopolamine  patch placed behind your ear for the management of post- operative nausea and/or vomiting:  1. The medication in the patch is effective for 72 hours, after which it should be removed.  Wrap patch in a tissue and discard in the trash. Wash hands thoroughly with soap and water . 2. You may remove the patch earlier than 72 hours if you experience unpleasant side effects which may include dry mouth, dizziness or visual disturbances. 3. Avoid touching the patch. Wash your hands with soap and water  after contact with the  patch.     May have Tylenol  today after 4:15 PM

## 2023-03-25 NOTE — Anesthesia Preprocedure Evaluation (Signed)
 Anesthesia Evaluation  Patient identified by MRN, date of birth, ID band Patient awake    Reviewed: Allergy & Precautions, NPO status , Patient's Chart, lab work & pertinent test results  Airway Mallampati: II  TM Distance: >3 FB Neck ROM: Full    Dental no notable dental hx.    Pulmonary asthma , sleep apnea , former smoker   Pulmonary exam normal        Cardiovascular negative cardio ROS Normal cardiovascular exam     Neuro/Psych  Headaches PSYCHIATRIC DISORDERS Anxiety Depression     Neuromuscular disease    GI/Hepatic negative GI ROS,,,(+)     substance abuse    Endo/Other  diabetesHypothyroidism  Patient on GLP-1 Agonist  Renal/GU negative Renal ROS     Musculoskeletal negative musculoskeletal ROS (+)  narcotic dependent  Abdominal  (+) + obese  Peds  Hematology  (+) Blood dyscrasia (Plavix)   Anesthesia Other Findings Malignant neoplasm of upper-inner quadrant of left breast in female, estrogen receptor positive  Reproductive/Obstetrics                             Anesthesia Physical Anesthesia Plan  ASA: 3  Anesthesia Plan: General   Post-op Pain Management: Precedex    Induction: Intravenous  PONV Risk Score and Plan: 3 and Ondansetron , Dexamethasone , Midazolam  and Treatment may vary due to age or medical condition  Airway Management Planned: LMA  Additional Equipment:   Intra-op Plan:   Post-operative Plan: Extubation in OR  Informed Consent: I have reviewed the patients History and Physical, chart, labs and discussed the procedure including the risks, benefits and alternatives for the proposed anesthesia with the patient or authorized representative who has indicated his/her understanding and acceptance.     Dental advisory given  Plan Discussed with: CRNA  Anesthesia Plan Comments:        Anesthesia Quick Evaluation

## 2023-03-25 NOTE — Interval H&P Note (Signed)
 History and Physical Interval Note:  03/25/2023 10:23 AM  April Meyer  has presented today for surgery, with the diagnosis of Malignant neoplasm of upper-inner quadrant of left breast in female, estrogen receptor positive.  The various methods of treatment have been discussed with the patient and family. After consideration of risks, benefits and other options for treatment, the patient has consented to  Procedure(s): excision of excess medial and lateral breast (Bilateral) as a surgical intervention.  The patient's history has been reviewed, patient examined, no change in status, stable for surgery.  I have reviewed the patient's chart and labs.  Questions were answered to the patient's satisfaction.     Lindaann Requena Marquinn Meschke

## 2023-03-25 NOTE — Anesthesia Postprocedure Evaluation (Signed)
 Anesthesia Post Note  Patient: April Meyer  Procedure(s) Performed: excision of excess medial and lateral breast (Bilateral: Chest)     Patient location during evaluation: PACU Anesthesia Type: General Level of consciousness: awake Pain management: pain level controlled Vital Signs Assessment: post-procedure vital signs reviewed and stable Respiratory status: spontaneous breathing, nonlabored ventilation and respiratory function stable Cardiovascular status: blood pressure returned to baseline and stable Postop Assessment: no apparent nausea or vomiting Anesthetic complications: no   No notable events documented.  Last Vitals:  Vitals:   03/25/23 1230 03/25/23 1251  BP: 125/74 131/89  Pulse: 65 71  Resp: 16 20  Temp:  (!) 36.2 C  SpO2: 98% 96%    Last Pain:  Vitals:   03/25/23 1251  TempSrc: Temporal  PainSc: 0-No pain                 Pradyun Ishman P Anan Dapolito

## 2023-03-25 NOTE — Anesthesia Procedure Notes (Signed)
 Procedure Name: LMA Insertion Date/Time: 03/25/2023 11:10 AM  Performed by: Eugenia Hess, CRNAPre-anesthesia Checklist: Patient identified, Emergency Drugs available, Suction available and Patient being monitored Patient Re-evaluated:Patient Re-evaluated prior to induction Oxygen Delivery Method: Circle System Utilized Preoxygenation: Pre-oxygenation with 100% oxygen Induction Type: IV induction Ventilation: Mask ventilation without difficulty LMA: LMA inserted LMA Size: 4.0 Number of attempts: 1 Airway Equipment and Method: bite block Placement Confirmation: positive ETCO2 Tube secured with: Tape Dental Injury: Teeth and Oropharynx as per pre-operative assessment

## 2023-03-25 NOTE — Transfer of Care (Signed)
 Immediate Anesthesia Transfer of Care Note  Patient: April Meyer  Procedure(s) Performed: excision of excess medial and lateral breast (Bilateral: Chest)  Patient Location: PACU  Anesthesia Type:General  Level of Consciousness: awake, drowsy, and patient cooperative  Airway & Oxygen Therapy: Patient Spontanous Breathing and Patient connected to face mask oxygen  Post-op Assessment: Report given to RN and Post -op Vital signs reviewed and stable  Post vital signs: Reviewed and stable  Last Vitals:  Vitals Value Taken Time  BP    Temp    Pulse 72 03/25/23 1209  Resp 12 03/25/23 1209  SpO2 100 % 03/25/23 1209  Vitals shown include unfiled device data.  Last Pain:  Vitals:   03/25/23 0955  TempSrc: Temporal  PainSc: 0-No pain         Complications: No notable events documented.

## 2023-03-26 ENCOUNTER — Encounter (HOSPITAL_BASED_OUTPATIENT_CLINIC_OR_DEPARTMENT_OTHER): Payer: Self-pay | Admitting: Plastic Surgery

## 2023-03-26 LAB — SURGICAL PATHOLOGY

## 2023-03-27 ENCOUNTER — Ambulatory Visit: Payer: Self-pay | Admitting: Surgical

## 2023-03-27 DIAGNOSIS — N6489 Other specified disorders of breast: Secondary | ICD-10-CM

## 2023-03-27 DIAGNOSIS — N651 Disproportion of reconstructed breast: Secondary | ICD-10-CM

## 2023-03-27 DIAGNOSIS — Z170 Estrogen receptor positive status [ER+]: Secondary | ICD-10-CM

## 2023-03-27 DIAGNOSIS — C50412 Malignant neoplasm of upper-outer quadrant of left female breast: Secondary | ICD-10-CM

## 2023-03-27 NOTE — Progress Notes (Signed)
     Patient ID: MABRY HENTHORNE, female    DOB: [birth year], 58 y.o.   MRN: [id number]  Chief Complaint  Patient presents with   Follow-up    Pod2 televisit      ICD-10-CM   1. Malignant neoplasm of upper-inner quadrant of left breast in female, estrogen receptor positive (HCC)  C50.212    Z17.0     2. Postoperative breast asymmetry  N64.89     3. Primary malignant neoplasm of upper outer quadrant of female breast, left (HCC)  C50.412        History of Present Illness: April Meyer is a 58 y.o.  female who presents for virtual post operative evaluation after excision of excess medial and lateral breast skin and soft tissue with Dr.  Ulice Bold 2 days ago on 03/25/2023.  Patient reports she is doing really well today, she has not having any pain or discomfort.  She does not endorse any concerns or any other issues.  She does report that she is scheduled to see [place] next week on Tuesday, but may not be able to make that appointment due to another procedure that day.  She would like to move the appointment to another day if possible.  The patient gave consent to have this visit done by telemedicine / virtual visit, two identifiers were used to identify patient. This is also consent for access the chart and treat the patient via this visit. The patient is located [place].  I, the provider, am at the office.  We spent 4 minutes together for the visit.  Joined by telephone.   Assessment/Plan:  Patient is doing really well after surgery 2 days ago, she does not have any questions or concerns.  She is scheduled to see [place] in approximately 1 week.   Recommend continuing to avoid strenuous activities/heavy lifting Recommend calling with questions or concerns

## 2023-03-30 ENCOUNTER — Other Ambulatory Visit (HOSPITAL_BASED_OUTPATIENT_CLINIC_OR_DEPARTMENT_OTHER): Payer: Self-pay

## 2023-03-31 ENCOUNTER — Ambulatory Visit: Payer: PPO | Admitting: Family Medicine

## 2023-03-31 ENCOUNTER — Encounter: Payer: PPO | Admitting: Plastic Surgery

## 2023-04-02 ENCOUNTER — Ambulatory Visit (INDEPENDENT_AMBULATORY_CARE_PROVIDER_SITE_OTHER): Payer: PPO | Admitting: Surgical

## 2023-04-02 DIAGNOSIS — Z170 Estrogen receptor positive status [ER+]: Secondary | ICD-10-CM

## 2023-04-02 DIAGNOSIS — C50212 Malignant neoplasm of upper-inner quadrant of left female breast: Secondary | ICD-10-CM

## 2023-04-02 DIAGNOSIS — N6489 Other specified disorders of breast: Secondary | ICD-10-CM

## 2023-04-02 DIAGNOSIS — C50412 Malignant neoplasm of upper-outer quadrant of left female breast: Secondary | ICD-10-CM

## 2023-04-02 NOTE — Progress Notes (Signed)
Patient is a 58 year old female here for follow-up after excision of medial and lateral excess skin and soft tissue of bilateral breasts with Dr. Ulice Bold on 03/25/2023.  She is 1 week postop.  She reports she is doing really well, she is not having any issues.  She did not have any questions or concerns.  She reports she is very happy so far.  Chaperone present on exam On exam bilateral breast incisions are intact, healing well.  There is no erythema or cellulitic changes noted.  No subcutaneous fluid collection noted palpation.  A/P:  Patient is healing well, there is no signs infection or concern on exam.  Recommend continue with compressive garments, avoid strenuous activities or heavy lifting.  Recommend following up in 3 weeks for reevaluation.  Pictures were obtained of the patient and placed in the chart with the patient's or guardian's permission.

## 2023-04-07 ENCOUNTER — Ambulatory Visit (INDEPENDENT_AMBULATORY_CARE_PROVIDER_SITE_OTHER): Payer: PPO | Admitting: Family Medicine

## 2023-04-07 ENCOUNTER — Encounter: Payer: Self-pay | Admitting: Family Medicine

## 2023-04-07 VITALS — BP 114/80 | HR 109 | Temp 98.0°F | Ht 67.0 in | Wt 223.0 lb

## 2023-04-07 DIAGNOSIS — G932 Benign intracranial hypertension: Secondary | ICD-10-CM

## 2023-04-07 DIAGNOSIS — E119 Type 2 diabetes mellitus without complications: Secondary | ICD-10-CM

## 2023-04-07 DIAGNOSIS — Z6834 Body mass index (BMI) 34.0-34.9, adult: Secondary | ICD-10-CM | POA: Diagnosis not present

## 2023-04-07 DIAGNOSIS — Z7985 Long-term (current) use of injectable non-insulin antidiabetic drugs: Secondary | ICD-10-CM

## 2023-04-07 DIAGNOSIS — E66811 Obesity, class 1: Secondary | ICD-10-CM | POA: Diagnosis not present

## 2023-04-07 MED ORDER — SEMAGLUTIDE (1 MG/DOSE) 4 MG/3ML ~~LOC~~ SOPN: 1.0000 mg | PEN_INJECTOR | SUBCUTANEOUS | 1 refills | Status: DC

## 2023-04-07 NOTE — Assessment & Plan Note (Signed)
Lab Results  Component Value Date   HGBA1C 6.4 (H) 01/14/2023   She has good control of her type 2 diabetes using Ozempic 0.5 mg once weekly injection without adverse side effect.  Her nausea and heartburn do not seem to be related to Ozempic but rather from her IIH.  She avoids late night eating and high acid foods.  She is no longer on a PPI due to cost.  Continue to work on a low sugar/low starch diet rich in lean protein and fiber.  Slowly increase physical activity as tolerated.  Increase Ozempic to 1 mg once weekly injection. Update A1c in the next 2 months Consider the addition of famotidine if heartburn worsens from Ozempic use

## 2023-04-07 NOTE — Assessment & Plan Note (Signed)
Managed by Dr. Lionel December at [facility] health, patient has continued to struggle with chronic headaches, slightly better since her right styloidectomy for jugular vein stenosis and awaiting the left side.  She  is hoping to see an overall reduction in her headache frequency with weight loss. She has lost 5.1% of her total body weight in the past 2 months of medically supervised weight management Continue to work on a reduced calorie healthy diet focus on lean protein and fiber with meals, slowly adding in regular exercise

## 2023-04-07 NOTE — Progress Notes (Signed)
Office: [phone]  /  Fax: [phone]  WEIGHT SUMMARY AND BIOMETRICS  Starting Date: 01/14/23  Starting Weight: 235lb   Weight Lost Since Last Visit: 2lb   Vitals Temp: 98 F (36.7 C) BP: 114/80 Pulse Rate: (!) 109 SpO2: 96 %   Body Composition  Body Fat %: 42.6 % Fat Mass (lbs): 95 lbs Muscle Mass (lbs): 121.6 lbs Total Body Water (lbs): 81.4 lbs Visceral Fat Rating : 12     HPI  Chief Complaint: OBESITY  April Meyer is here to discuss her progress with her obesity treatment plan. She is on the the Category 3 Plan and states she is following her eating plan approximately 10 % of the time. She states she is exercising 0 minutes 0 times per week.  Interval History:  Since last office visit she is down 2 lb 1.2 pounds of muscle mass and down 3.8 pounds of body fat since her last visit This gives her a net weight loss of 12 pounds in the past 2 months of medically supervised weight management She has been enjoying added satiety from Ozempic 0.5 mg once weekly injection, recently paused for surgery She has been feeling slightly hungrier lately. Her head aches have reduced in frequency since having a right styloidectomy for jugular vein stenosis She plans on having the left side done this Spring She plans to begin some home exercises  Pharmacotherapy: Ozempic 0.5 mg once weekly injection  PHYSICAL EXAM:  Blood pressure 114/80, pulse (!) 109, temperature 98 F (36.7 C), height 5\' 7"  (1.702 m), weight 223 lb (101.2 kg), last menstrual period 08/10/2020, SpO2 96%. Body mass index is 34.93 kg/m.  General: She is overweight, cooperative, alert, well developed, and in no acute distress. PSYCH: Has normal mood, affect and thought process.   Lungs: Normal breathing effort, no conversational dyspnea.  ASSESSMENT AND PLAN  TREATMENT PLAN FOR OBESITY:  Recommended Dietary Goals  April Meyer is currently in the action stage of change. As such, her goal is to continue weight  management plan. She has agreed to practicing portion control and making smarter food choices, such as increasing vegetables and decreasing simple carbohydrates.  Behavioral Intervention  We discussed the following Behavioral Modification Strategies today: increasing lean protein intake to established goals, increasing fiber rich foods, increasing water intake , work on meal planning and preparation, keeping healthy foods at home, planning for success, and continue to work on maintaining a reduced calorie state, getting the recommended amount of protein, incorporating whole foods, making healthy choices, staying well hydrated and practicing mindfulness when eating..  Additional resources provided today: NA  Recommended Physical Activity Goals  April Meyer has been advised to work up to 150 minutes of moderate intensity aerobic activity a week and strengthening exercises 2-3 times per week for cardiovascular health, weight loss maintenance and preservation of muscle mass.   She has agreed to Think about enjoyable ways to increase daily physical activity and overcoming barriers to exercise and Increase physical activity in their day and reduce sedentary time (increase NEAT).  Pharmacotherapy changes for the treatment of obesity: Increase Ozempic to 1 mg once weekly injection  ASSOCIATED CONDITIONS ADDRESSED TODAY  Type 2 diabetes mellitus without complication, without long-term current use of insulin (HCC) Assessment & Plan: Lab Results  Component Value Date   HGBA1C 6.4 (H) 01/14/2023   She has good control of her type 2 diabetes using Ozempic 0.5 mg once weekly injection without adverse side effect.  Her nausea and heartburn do not seem to  be related to Ozempic but rather from her IIH.  She avoids late night eating and high acid foods.  She is no longer on a PPI due to cost.  Continue to work on a low sugar/low starch diet rich in lean protein and fiber.  Slowly increase physical activity as  tolerated.  Increase Ozempic to 1 mg once weekly injection. Update A1c in the next 2 months Consider the addition of famotidine if heartburn worsens from Ozempic use  Orders: -     Semaglutide (1 MG/DOSE); Inject 1 mg as directed once a week.  Dispense: 3 mL; Refill: 1  Class 1 obesity due to excess calories with serious comorbidity and body mass index (BMI) of 34.0 to 34.9 in adult  IIH (idiopathic intracranial hypertension) Assessment & Plan: Managed by Dr. Lionel December at [facility] health, patient has continued to struggle with chronic headaches, slightly better since her right styloidectomy for jugular vein stenosis and awaiting the left side.  She  is hoping to see an overall reduction in her headache frequency with weight loss. She has lost 5.1% of her total body weight in the past 2 months of medically supervised weight management Continue to work on a reduced calorie healthy diet focus on lean protein and fiber with meals, slowly adding in regular exercise        She was informed of the importance of frequent follow up visits to maximize her success with intensive lifestyle modifications for her multiple health conditions.   ATTESTASTION STATEMENTS:  Reviewed by clinician on day of visit: allergies, medications, problem list, medical history, surgical history, family history, social history, and previous encounter notes pertinent to obesity diagnosis.   I have personally spent 30 minutes total time today in preparation, patient care, nutritional counseling and documentation for this visit, including the following: review of clinical lab tests; review of medical tests/procedures/services.      Glennis Brink, DO DABFM, DABOM [facility] [address] [place], [place] [address] [phone]

## 2023-04-16 ENCOUNTER — Encounter: Payer: PPO | Admitting: Surgical

## 2023-04-21 ENCOUNTER — Ambulatory Visit (INDEPENDENT_AMBULATORY_CARE_PROVIDER_SITE_OTHER): Payer: HMO | Admitting: Surgical

## 2023-04-21 VITALS — BP 123/80 | HR 74

## 2023-04-21 DIAGNOSIS — C50212 Malignant neoplasm of upper-inner quadrant of left female breast: Secondary | ICD-10-CM

## 2023-04-21 DIAGNOSIS — C50412 Malignant neoplasm of upper-outer quadrant of left female breast: Secondary | ICD-10-CM

## 2023-04-21 DIAGNOSIS — N6489 Other specified disorders of breast: Secondary | ICD-10-CM

## 2023-04-21 DIAGNOSIS — Z170 Estrogen receptor positive status [ER+]: Secondary | ICD-10-CM

## 2023-04-21 NOTE — Progress Notes (Signed)
58 year old female for follow-up after excision of bilateral excess medial and lateral breast skin and soft tissue after mastectomies.  She is overall doing really well, has no complaints.  She does have some sutures that she would like removed as they are causing her some irritation.  She is not having any infectious symptoms.  Chaperone on exam On exam bilateral breast incisions are intact, healing well.  Monocryl suture knots are noted.  There is no erythema or cellulitic changes noted.  No subcutaneous fluid collection noted.  A/P:  Suture knots were removed, patient tolerated this well.  Continue with compressive garments, avoid strenuous activities or heavy lifting. She is traveling to a water park this weekend, discussed with patient to avoid submerging the incisions in water.  Recommend following up in 2 to 3 weeks for reevaluation.

## 2023-04-23 ENCOUNTER — Encounter: Payer: PPO | Admitting: Surgical

## 2023-04-30 ENCOUNTER — Encounter: Payer: PPO | Admitting: Surgical

## 2023-04-30 ENCOUNTER — Other Ambulatory Visit: Payer: Self-pay | Admitting: Medical Genetics

## 2023-05-01 ENCOUNTER — Other Ambulatory Visit (HOSPITAL_COMMUNITY): Payer: Self-pay | Admitting: Psychiatry

## 2023-05-02 ENCOUNTER — Other Ambulatory Visit (HOSPITAL_BASED_OUTPATIENT_CLINIC_OR_DEPARTMENT_OTHER): Payer: Self-pay

## 2023-05-04 ENCOUNTER — Other Ambulatory Visit (HOSPITAL_BASED_OUTPATIENT_CLINIC_OR_DEPARTMENT_OTHER): Payer: Self-pay

## 2023-05-05 ENCOUNTER — Encounter: Payer: Self-pay | Admitting: Family Medicine

## 2023-05-05 ENCOUNTER — Ambulatory Visit (INDEPENDENT_AMBULATORY_CARE_PROVIDER_SITE_OTHER): Payer: PPO | Admitting: Family Medicine

## 2023-05-05 ENCOUNTER — Ambulatory Visit (INDEPENDENT_AMBULATORY_CARE_PROVIDER_SITE_OTHER): Payer: Self-pay | Admitting: Surgical

## 2023-05-05 VITALS — BP 117/75 | HR 79 | Temp 98.2°F | Ht 67.0 in | Wt 213.0 lb

## 2023-05-05 DIAGNOSIS — Z7985 Long-term (current) use of injectable non-insulin antidiabetic drugs: Secondary | ICD-10-CM

## 2023-05-05 DIAGNOSIS — N6489 Other specified disorders of breast: Secondary | ICD-10-CM

## 2023-05-05 DIAGNOSIS — Z6833 Body mass index (BMI) 33.0-33.9, adult: Secondary | ICD-10-CM | POA: Diagnosis not present

## 2023-05-05 DIAGNOSIS — C50212 Malignant neoplasm of upper-inner quadrant of left female breast: Secondary | ICD-10-CM

## 2023-05-05 DIAGNOSIS — E119 Type 2 diabetes mellitus without complications: Secondary | ICD-10-CM | POA: Diagnosis not present

## 2023-05-05 DIAGNOSIS — Z170 Estrogen receptor positive status [ER+]: Secondary | ICD-10-CM

## 2023-05-05 DIAGNOSIS — G932 Benign intracranial hypertension: Secondary | ICD-10-CM

## 2023-05-05 DIAGNOSIS — E66811 Obesity, class 1: Secondary | ICD-10-CM | POA: Diagnosis not present

## 2023-05-05 DIAGNOSIS — E6609 Other obesity due to excess calories: Secondary | ICD-10-CM

## 2023-05-05 MED ORDER — SEMAGLUTIDE (1 MG/DOSE) 4 MG/3ML ~~LOC~~ SOPN: 1.0000 mg | PEN_INJECTOR | SUBCUTANEOUS | 1 refills | Status: DC

## 2023-05-05 NOTE — Progress Notes (Signed)
 Office: [phone]  /  Fax: [phone]  WEIGHT SUMMARY AND BIOMETRICS  Starting Date: 01/14/23  Starting Weight: 235lb   Weight Lost Since Last Visit: 10lb   Vitals Temp: 98.2 F (36.8 C) BP: 117/75 Pulse Rate: 79 SpO2: 98 %   Body Composition  Body Fat %: 41.3 % Fat Mass (lbs): 88.2 lbs Muscle Mass (lbs): 119 lbs Total Body Water (lbs): 77.2 lbs Visceral Fat Rating : 11   HPI  Chief Complaint: OBESITY  [place]heton is here to discuss her progress with her obesity treatment plan. She is on the the Category 3 Plan and states she is following her eating plan approximately 10 % of the time. She states she is exercising 20 minutes 5 times per week.  Interval History:  Since last office visit she is down 10 lb She is down 2.6 pounds of body fat and down 6.8 pounds of body fat since her last visit She has done well with rising dose of Ozempic, last prescribed 1 mg weekly Enjoying the improved satiety with a reduction in cravings Denies meal skipping or nausea She is making better food choices, prioritizing lean protein and fiber She is feeling better with a reduction in headaches Able to add in more physical activity Planning to have L jugular vein stenosis surgery 4/4 Her total body weight loss is 22 lb in 3 mos of medically supervised weight management This is a 9.3% total body weight loss since starting our program  Pharmacotherapy: Ozempic 1 mg once weekly injection  PHYSICAL EXAM:  Blood pressure 117/75, pulse 79, temperature 98.2 F (36.8 C), height 5\' 7"  (1.702 m), weight 213 lb (96.6 kg), last menstrual period 08/10/2020, SpO2 98%. Body mass index is 33.36 kg/m.  General: She is overweight, cooperative, alert, well developed, and in no acute distress. PSYCH: Has normal mood, improved affect and thought process.   Lungs: Normal breathing effort, no conversational dyspnea.   ASSESSMENT AND PLAN  TREATMENT PLAN FOR OBESITY:  Recommended Dietary  Goals  Izadora is currently in the action stage of change. As such, her goal is to continue weight management plan. She has agreed to the Category 3 Plan.  Behavioral Intervention  We discussed the following Behavioral Modification Strategies today: increasing lean protein intake to established goals, increasing fiber rich foods, increasing water intake , work on meal planning and preparation, keeping healthy foods at home, practice mindfulness eating and understand the difference between hunger signals and cravings, work on managing stress, creating time for self-care and relaxation, avoiding temptations and identifying enticing environmental cues, planning for success, and continue to work on maintaining a reduced calorie state, getting the recommended amount of protein, incorporating whole foods, making healthy choices, staying well hydrated and practicing mindfulness when eating..  Additional resources provided today: NA  Recommended Physical Activity Goals  Azrielle has been advised to work up to 150 minutes of moderate intensity aerobic activity a week and strengthening exercises 2-3 times per week for cardiovascular health, weight loss maintenance and preservation of muscle mass.   She has agreed to Start aerobic activity with a goal of 150 minutes a week at moderate intensity.   Pharmacotherapy changes for the treatment of obesity: None  ASSOCIATED CONDITIONS ADDRESSED TODAY  Type 2 diabetes mellitus without complication, without long-term current use of insulin (HCC) She is doing well on Ozempic 1 mg once weekly injection without adverse side effects.  She is doing well with medically supervised weight management on a prescribed dietary plan.  She  has been able to reduce her intake of starches sweets.  She plans to increase her walking time this spring.  Continue regular diabetes management through primary care.  -     Semaglutide (1 MG/DOSE); Inject 1 mg as directed once a week.  Dispense: 3  mL; Refill: 1  Class 1 obesity due to excess calories with serious comorbidity and body mass index (BMI) of 33.0 to 33.9 in adult  IIH (idiopathic intracranial hypertension) Managed by Dr. Lionel December at [facility] health she is still dealing with frequent headaches but has seen a reduction in severity with weight loss She is planning on having surgery for left jugular vein stenosis 4 /4 Her last Ozempic injection will be 2 weeks prior to her surgical date and may resume postop    She was informed of the importance of frequent follow up visits to maximize her success with intensive lifestyle modifications for her multiple health conditions.   ATTESTASTION STATEMENTS:  Reviewed by clinician on day of visit: allergies, medications, problem list, medical history, surgical history, family history, social history, and previous encounter notes pertinent to obesity diagnosis.   I have personally spent 30 minutes total time today in preparation, patient care, nutritional counseling and documentation for this visit, including the following: review of clinical lab tests; review of medical tests/procedures/services.      Glennis Brink, DO DABFM, DABOM [facility] [address] [place], [place] [address] [phone]

## 2023-05-05 NOTE — Progress Notes (Signed)
 Patient is a very pleasant 58 year old female here for follow-up after excision of bilateral excess medial and lateral breast skin and soft tissue after mastectomies.  She is 6 weeks postop.  She is doing really well.  She is not have any issues at this time.   Chaperone present on exam On exam bilateral breast incisions are intact and well-healed.  No erythema or cellulitic changes.  No subcutaneous fluid collection noted.  A/P:  Patient is doing really well, no signs of infection or concern on exam.  Discussed no restrictions at this time, encouraged her to wear compressive garments when active.  No longer necessary to wear compression at night. Increase activity as tolerated.  Recommend calling with questions or concerns.

## 2023-05-07 ENCOUNTER — Other Ambulatory Visit (HOSPITAL_BASED_OUTPATIENT_CLINIC_OR_DEPARTMENT_OTHER): Payer: Self-pay

## 2023-05-07 MED ORDER — CYCLOBENZAPRINE HCL 5 MG PO TABS
5.0000 mg | ORAL_TABLET | Freq: Three times a day (TID) | ORAL | 2 refills | Status: DC | PRN
  Filled 2023-05-07: qty 90, 30d supply, fill #0

## 2023-05-07 MED ORDER — OXYCODONE HCL 10 MG PO TABS
10.0000 mg | ORAL_TABLET | Freq: Four times a day (QID) | ORAL | 0 refills | Status: DC | PRN
  Filled 2023-06-01: qty 120, 30d supply, fill #0

## 2023-05-20 ENCOUNTER — Other Ambulatory Visit (HOSPITAL_BASED_OUTPATIENT_CLINIC_OR_DEPARTMENT_OTHER): Payer: Self-pay

## 2023-06-01 ENCOUNTER — Other Ambulatory Visit (HOSPITAL_BASED_OUTPATIENT_CLINIC_OR_DEPARTMENT_OTHER): Payer: Self-pay

## 2023-06-02 ENCOUNTER — Ambulatory Visit: Payer: HMO | Admitting: Family Medicine

## 2023-06-09 ENCOUNTER — Encounter: Payer: Self-pay | Admitting: Family Medicine

## 2023-06-09 ENCOUNTER — Other Ambulatory Visit (HOSPITAL_BASED_OUTPATIENT_CLINIC_OR_DEPARTMENT_OTHER): Payer: Self-pay

## 2023-06-09 ENCOUNTER — Ambulatory Visit (INDEPENDENT_AMBULATORY_CARE_PROVIDER_SITE_OTHER): Admitting: Family Medicine

## 2023-06-09 VITALS — BP 114/75 | HR 62 | Temp 98.6°F | Ht 67.0 in | Wt 214.0 lb

## 2023-06-09 DIAGNOSIS — Z7985 Long-term (current) use of injectable non-insulin antidiabetic drugs: Secondary | ICD-10-CM

## 2023-06-09 DIAGNOSIS — E119 Type 2 diabetes mellitus without complications: Secondary | ICD-10-CM

## 2023-06-09 DIAGNOSIS — G932 Benign intracranial hypertension: Secondary | ICD-10-CM | POA: Diagnosis not present

## 2023-06-09 DIAGNOSIS — E559 Vitamin D deficiency, unspecified: Secondary | ICD-10-CM

## 2023-06-09 DIAGNOSIS — E66811 Obesity, class 1: Secondary | ICD-10-CM | POA: Diagnosis not present

## 2023-06-09 DIAGNOSIS — E6609 Other obesity due to excess calories: Secondary | ICD-10-CM

## 2023-06-09 DIAGNOSIS — Z6833 Body mass index (BMI) 33.0-33.9, adult: Secondary | ICD-10-CM

## 2023-06-09 MED ORDER — OXYCODONE HCL 10 MG PO TABS
10.0000 mg | ORAL_TABLET | ORAL | 0 refills | Status: DC | PRN
  Filled 2023-06-22: qty 180, 30d supply, fill #0

## 2023-06-09 MED ORDER — SEMAGLUTIDE (1 MG/DOSE) 4 MG/3ML ~~LOC~~ SOPN
1.0000 mg | PEN_INJECTOR | SUBCUTANEOUS | 1 refills | Status: DC
Start: 2023-06-09 — End: 2023-07-14

## 2023-06-09 NOTE — Progress Notes (Signed)
 Office: [phone]  /  Fax: [phone]  WEIGHT SUMMARY AND BIOMETRICS  Starting Date: 01/14/23  Starting Weight: 235lb   Weight Lost Since Last Visit: 0lb   Vitals Temp: 98.6 F (37 C) BP: 114/75 Pulse Rate: 62 SpO2: 99 %   Body Composition  Body Fat %: 43.3 % Fat Mass (lbs): 92.8 lbs Muscle Mass (lbs): 115.2 lbs Total Body Water (lbs): 82.4 lbs Visceral Fat Rating : 11    HPI  Chief Complaint: OBESITY  April Meyer is here to discuss her progress with her obesity treatment plan. She is on the the Category 3 Plan and states she is following her eating plan approximately 20 % of the time. She states she is exercising 20 minutes 4 times per week.  Interval History:  Since last office visit she is up 1 lb She is down 3.8 lb of muscle mass and up 4.6 lb of body fat since last visit She has a net weight loss of 21 lb in 4 mos of medically supervised weight management This is an 8.9% TBW loss She is pausing Ozempic 1 mg x 2 week for upcoming L jugular vein stenosis surgery 4/4 She has felt hungrier without it She has had SE of increased diarrhea on Ozempic Admit to poor food choices and eating off plan Prone to snacking when in pain or bored at home Exercise has been limited due to increased headaches  Pharmacotherapy: Ozempic 1 mg weekly for T2DM  PHYSICAL EXAM:  Blood pressure 114/75, pulse 62, temperature 98.6 F (37 C), height 5\' 7"  (1.702 m), weight 214 lb (97.1 kg), last menstrual period 08/10/2020, SpO2 99%. Body mass index is 33.52 kg/m.  General: She is overweight, cooperative, alert, well developed, and in no acute distress. PSYCH: Has normal mood, affect and thought process.   Lungs: Normal breathing effort, no conversational dyspnea.   ASSESSMENT AND PLAN  TREATMENT PLAN FOR OBESITY:  Recommended Dietary Goals  April Meyer is currently in the action stage of change. As such, her goal is to continue weight management plan. She has agreed to practicing  portion control and making smarter food choices, such as increasing vegetables and decreasing simple carbohydrates. Cut out greasy foods and sweets likely causing diarrhea from [name]  We discussed the following Behavioral Modification Strategies today: increasing lean protein intake to established goals, increasing fiber rich foods, avoiding skipping meals, increasing water intake , work on meal planning and preparation, keeping healthy foods at home, identifying sources and decreasing liquid calories, decreasing eating out or consumption of processed foods, and making healthy choices when eating convenient foods, continue to practice mindfulness when eating, and continue to work on maintaining a reduced calorie state, getting the recommended amount of protein, incorporating whole foods, making healthy choices, staying well hydrated and practicing mindfulness when eating.Marland Kitchen Keep junk food snacks out of the house Eat on a schedule to avoid grazing during the day Cut out high fat foods that are causing GI upset  Additional resources provided today: NA  Recommended Physical Activity Goals  April Meyer has been advised to work up to 150 minutes of moderate intensity aerobic activity a week and strengthening exercises 2-3 times per week for cardiovascular health, weight loss maintenance and preservation of muscle mass.   She has agreed to Think about enjoyable ways to increase daily physical activity and overcoming barriers to exercise and Increase physical activity in their day and reduce sedentary time (increase NEAT). Plan to increase walking time as headaches improve  Pharmacotherapy changes for the treatment of obesity: none  ASSOCIATED CONDITIONS ADDRESSED TODAY  Type 2 diabetes mellitus without complication, without long-term current use of insulin (HCC) Doing well on Ozempic 1 mg weekly, pausing for upcoming surgery this Fri Can resume after surgery once eating solid  foods Continue to work on a low sugar diet focused on lean protein and fiber with meals Plan to ramp up walking time once headaches improve Repeat A1c today  -     Semaglutide (1 MG/DOSE); Inject 1 mg as directed once a week.  Dispense: 3 mL; Refill: 1 -     Hemoglobin A1c  Vitamin D deficiency Last vitamin D Lab Results  Component Value Date   VD25OH 29.2 (L) 01/14/2023  She has been taking OTC vitamin D3 2,000 international units  daily Repeat lab today  -     VITAMIN D 25 Hydroxy (Vit-D Deficiency, Fractures)  Class 1 obesity due to excess calories with serious comorbidity and body mass index (BMI) of 33.0 to 33.9 in adult  IIH (idiopathic intracranial hypertension) Worsening with daily frequency severe headaches, managed by Dr Lionel December at [facility] Headaches are limiting her ability to exercise and she is doing some comfort eating.  These have caused a halt in weight loss.  She has lost 8.9% TBW loss in 4 mos of medically supervised weight management Look for improvements in headaches following upcoming L jugular vein stenosis procedure this Friday.     She was informed of the importance of frequent follow up visits to maximize her success with intensive lifestyle modifications for her multiple health conditions.   ATTESTASTION STATEMENTS:  Reviewed by clinician on day of visit: allergies, medications, problem list, medical history, surgical history, family history, social history, and previous encounter notes pertinent to obesity diagnosis.   I have personally spent 30 minutes total time today in preparation, patient care, nutritional counseling and documentation for this visit, including the following: review of clinical lab tests; review of medical tests/procedures/services.      Glennis Brink, DO DABFM, DABOM [facility] [address] [place], [place] [address] [phone]

## 2023-06-10 LAB — VITAMIN D 25 HYDROXY (VIT D DEFICIENCY, FRACTURES): Vit D, 25-Hydroxy: 42.4 ng/mL (ref 30.0–100.0)

## 2023-06-10 LAB — HEMOGLOBIN A1C
Est. average glucose Bld gHb Est-mCnc: 123 mg/dL
Hgb A1c MFr Bld: 5.9 % — ABNORMAL HIGH (ref 4.8–5.6)

## 2023-06-22 ENCOUNTER — Other Ambulatory Visit (HOSPITAL_BASED_OUTPATIENT_CLINIC_OR_DEPARTMENT_OTHER): Payer: Self-pay

## 2023-06-22 ENCOUNTER — Other Ambulatory Visit: Payer: Self-pay

## 2023-07-01 ENCOUNTER — Other Ambulatory Visit (HOSPITAL_COMMUNITY)

## 2023-07-07 ENCOUNTER — Other Ambulatory Visit (HOSPITAL_COMMUNITY)

## 2023-07-09 ENCOUNTER — Ambulatory Visit: Admitting: Family Medicine

## 2023-07-09 ENCOUNTER — Other Ambulatory Visit (HOSPITAL_COMMUNITY)
Admission: RE | Admit: 2023-07-09 | Discharge: 2023-07-09 | Disposition: A | Discharge status: Home / Self Care | Payer: Self-pay | Source: Ambulatory Visit | Attending: Medical Genetics | Admitting: Medical Genetics

## 2023-07-13 ENCOUNTER — Other Ambulatory Visit (HOSPITAL_BASED_OUTPATIENT_CLINIC_OR_DEPARTMENT_OTHER): Payer: Self-pay

## 2023-07-14 ENCOUNTER — Ambulatory Visit (INDEPENDENT_AMBULATORY_CARE_PROVIDER_SITE_OTHER): Admitting: Family Medicine

## 2023-07-14 ENCOUNTER — Encounter: Payer: Self-pay | Admitting: Family Medicine

## 2023-07-14 VITALS — BP 121/81 | HR 71 | Temp 98.3°F | Ht 67.0 in | Wt 213.0 lb

## 2023-07-14 DIAGNOSIS — G932 Benign intracranial hypertension: Secondary | ICD-10-CM

## 2023-07-14 DIAGNOSIS — Z7985 Long-term (current) use of injectable non-insulin antidiabetic drugs: Secondary | ICD-10-CM

## 2023-07-14 DIAGNOSIS — E119 Type 2 diabetes mellitus without complications: Secondary | ICD-10-CM | POA: Diagnosis not present

## 2023-07-14 DIAGNOSIS — E6609 Other obesity due to excess calories: Secondary | ICD-10-CM

## 2023-07-14 DIAGNOSIS — E66811 Obesity, class 1: Secondary | ICD-10-CM | POA: Diagnosis not present

## 2023-07-14 DIAGNOSIS — E559 Vitamin D deficiency, unspecified: Secondary | ICD-10-CM | POA: Diagnosis not present

## 2023-07-14 DIAGNOSIS — Z6833 Body mass index (BMI) 33.0-33.9, adult: Secondary | ICD-10-CM

## 2023-07-14 MED ORDER — OZEMPIC (2 MG/DOSE) 8 MG/3ML ~~LOC~~ SOPN: PEN_INJECTOR | SUBCUTANEOUS | 1 refills | Status: DC

## 2023-07-14 NOTE — Patient Instructions (Signed)
 Keep Herbalife shake for breakfast 2 hrs later: 1 slice of sourdough ([employer]) + no sugar added jelly + Dannon Light and Fit Greek yogurt Frozen meal OK for lunch Between lunch and dinner: add a fresh fruit or veggie (apple slice with peanut butter or a peach + string cheese or babycarrots with hummus, cucumber slices + high protein ranch dip -- plain greek yogurt + powdered ranch Dinner: lean protein + ONE starch + non starchy veggie OK to keep one after dinner snack - <150 cal  Hydrate well with water  and other zero sugar drinks  Walking goal: 2 miles 3 days/ wk  Go up on Ozempic  to 2 mg weekly

## 2023-07-14 NOTE — Progress Notes (Addendum)
 Office: [phone]  /  Fax: [phone]  WEIGHT SUMMARY AND BIOMETRICS  Starting Date: 01/14/23  Starting Weight: 235lb   Weight Lost Since Last Visit: 1lb   Vitals Temp: 98.3 F (36.8 C) BP: 121/81 Pulse Rate: 71 SpO2: 97 %   Body Composition  Body Fat %: 42.7 % Fat Mass (lbs): 91 lbs Muscle Mass (lbs): 115.8 lbs Total Body Water  (lbs): 81.6 lbs Visceral Fat Rating : 11   HPI  Chief Complaint: OBESITY  April Meyer is here to discuss her progress with her obesity treatment plan. She is on the the Category 3 Plan and states she is following her eating plan approximately 10 % of the time. She states she is walking 2 times per week.   Interval History:  Since last office visit she is down 1 lb She is up 0.6 pounds of muscle mass and down 1.8 pounds of body fat since last visit She has done well on Ozempic  1 mg once weekly injection She denies GI upset She is not skipping meals She has noticed less satiety over the past month She recently celebrated her birthday She has better ROM of her neck and less headaches styloidectomy x 2  her support has been goood She has a net weight loss of 22 lb in 5 mos This is a 9.3% TBW loss She is still craving sweets  24 hr food recall is: Herbalife protein shake -- mixed with water  2 hrs later-- toast with no sugar added jelly and Activia yogurt Healthy Choice frozen meal Dinner at home--protein/ starch/ veggie + bread Low cal ice cream (sometimes)  Pharmacotherapy: Ozempic  1 mg once weekly injection  PHYSICAL EXAM:  Blood pressure 121/81, pulse 71, temperature 98.3 F (36.8 C), height 5' 7 (1.702 m), weight 213 lb (96.6 kg), last menstrual period 08/10/2020, SpO2 97%. Body mass index is 33.36 kg/m.  General: She is overweight, cooperative, alert, well developed, and in no acute distress. PSYCH: Has normal mood, affect and thought process.   Lungs: Normal breathing effort, no conversational dyspnea.   ASSESSMENT AND  PLAN  TREATMENT PLAN FOR OBESITY:  Recommended Dietary Goals  April Meyer is currently in the action stage of change. As such, her goal is to continue weight management plan. She has agreed to the Category 3 Plan. Reviewed dietary change goals together and after visit summary, adding in more lean protein and fiber snacks  Behavioral Intervention  We discussed the following Behavioral Modification Strategies today: increasing lean protein intake to established goals, increasing fiber rich foods, avoiding skipping meals, increasing water  intake , keeping healthy foods at home, practice mindfulness eating and understand the difference between hunger signals and cravings, work on managing stress, creating time for self-care and relaxation, and continue to work on maintaining a reduced calorie state, getting the recommended amount of protein, incorporating whole foods, making healthy choices, staying well hydrated and practicing mindfulness when eating..  Additional resources provided today: NA  Recommended Physical Activity Goals  April Meyer has been advised to work up to 150 minutes of moderate intensity aerobic activity a week and strengthening exercises 2-3 times per week for cardiovascular health, weight loss maintenance and preservation of muscle mass.   She has agreed to Increase the intensity, frequency or duration of aerobic exercises   Continue walking with a goal of 30 minutes 4 to 5 days a week  Pharmacotherapy changes for the treatment of obesity: Increase Ozempic  to 2 mg once weekly injection  ASSOCIATED CONDITIONS ADDRESSED TODAY  Type 2  diabetes mellitus without complication, without long-term current use of insulin  (HCC) Lab Results  Component Value Date   HGBA1C 5.9 (H) 06/09/2023  Improving.  Reviewed labs from last visit.  She has seen good improvement in her A1c down to 5.9 from 6.4.  She is doing well on Ozempic  in addition to weight reduction, reducing added sugar and refined  carbohydrates.  She has been able to add in more regular walking.  Continue healthy lifestyle changes, increasing Ozempic  to 2 mg once weekly injection.  She denies GI side effects or meal skipping.  -     Ozempic  (2 MG/DOSE); 2 mg once weekly injection  Dispense: 3 mL; Refill: 1  Class 1 obesity due to excess calories with serious comorbidity and body mass index (BMI) of 33.0 to 33.9 in adult  IIH (idiopathic intracranial hypertension) Improving.  She notes improving range of motion of her cervical spine post bilateral styloidectomy with Dr. Fargen.  She is doing some range of motion exercises at home.  She has seen a reduction in both headache frequency and severity.  She still has some issues with pain that trigger poor food choices and mindless eating.  Continue current medications per  neurology and neurosurgery.  Vitamin D  deficiency Last vitamin D  Lab Results  Component Value Date   VD25OH 42.4 06/09/2023  Improving.  Her vitamin D  level has improved.  Reviewed labs from last visit.  She may change over to over-the-counter vitamin D  at 2000 IU once daily for maintenance.  Energy level has improved.    She was informed of the importance of frequent follow up visits to maximize her success with intensive lifestyle modifications for her multiple health conditions.   ATTESTASTION STATEMENTS:  Reviewed by clinician on day of visit: allergies, medications, problem list, medical history, surgical history, family history, social history, and previous encounter notes pertinent to obesity diagnosis.      April Meyer, D.O. DABFM, DABOM [facility] [address] [place], [place] [address] [phone]

## 2023-07-17 LAB — GENECONNECT MOLECULAR SCREEN: Genetic Analysis Overall Interpretation: NEGATIVE

## 2023-07-21 ENCOUNTER — Other Ambulatory Visit (HOSPITAL_BASED_OUTPATIENT_CLINIC_OR_DEPARTMENT_OTHER): Payer: Self-pay

## 2023-07-21 MED ORDER — OXYCODONE HCL 10 MG PO TABS
10.0000 mg | ORAL_TABLET | ORAL | 0 refills | Status: DC | PRN
  Filled 2023-08-21: qty 180, 30d supply, fill #0

## 2023-07-21 MED ORDER — OXYCODONE HCL 10 MG PO TABS
10.0000 mg | ORAL_TABLET | ORAL | 0 refills | Status: DC | PRN
  Filled 2023-07-21: qty 180, 30d supply, fill #0

## 2023-08-19 ENCOUNTER — Ambulatory Visit (HOSPITAL_COMMUNITY): Payer: PPO | Admitting: Psychiatry

## 2023-08-21 ENCOUNTER — Other Ambulatory Visit (HOSPITAL_BASED_OUTPATIENT_CLINIC_OR_DEPARTMENT_OTHER): Payer: Self-pay

## 2023-08-25 ENCOUNTER — Encounter: Payer: Self-pay | Admitting: Family Medicine

## 2023-08-25 ENCOUNTER — Ambulatory Visit (INDEPENDENT_AMBULATORY_CARE_PROVIDER_SITE_OTHER): Admitting: Family Medicine

## 2023-08-25 VITALS — BP 97/63 | HR 87 | Temp 99.9°F | Ht 67.0 in | Wt 207.0 lb

## 2023-08-25 DIAGNOSIS — E559 Vitamin D deficiency, unspecified: Secondary | ICD-10-CM | POA: Diagnosis not present

## 2023-08-25 DIAGNOSIS — E119 Type 2 diabetes mellitus without complications: Secondary | ICD-10-CM

## 2023-08-25 DIAGNOSIS — E66811 Obesity, class 1: Secondary | ICD-10-CM

## 2023-08-25 DIAGNOSIS — Z7985 Long-term (current) use of injectable non-insulin antidiabetic drugs: Secondary | ICD-10-CM

## 2023-08-25 DIAGNOSIS — G932 Benign intracranial hypertension: Secondary | ICD-10-CM | POA: Diagnosis not present

## 2023-08-25 DIAGNOSIS — Z6832 Body mass index (BMI) 32.0-32.9, adult: Secondary | ICD-10-CM

## 2023-08-25 MED ORDER — OZEMPIC (2 MG/DOSE) 8 MG/3ML ~~LOC~~ SOPN: PEN_INJECTOR | SUBCUTANEOUS | 1 refills | Status: DC

## 2023-08-25 NOTE — Progress Notes (Signed)
 Office: [phone]  /  Fax: [phone]  WEIGHT SUMMARY AND BIOMETRICS  Starting Date: 01/14/23  Starting Weight: 235lb   Weight Lost Since Last Visit: 6lb   Vitals Temp: 99.9 F (37.7 C) BP: 97/63 Pulse Rate: 87 SpO2: 96 %   Body Composition  Body Fat %: 40.5 % Fat Mass (lbs): 84 lbs Muscle Mass (lbs): 117 lbs Total Body Water  (lbs): 81.4 lbs Visceral Fat Rating : 11    HPI  Chief Complaint: OBESITY  April Meyer is here to discuss her progress with her obesity treatment plan. She is on the the Category 3 Plan and states she is following her eating plan approximately 20 % of the time. She states she is doing workout video at home.   Interval History:  Since last office visit she is down 6 lb She is up 1.2 pounds of muscle mass and down 7 pounds of body fat since last visit Gives her a net weight loss of 28 pounds in the past 6 months medically supervised weight management That is an 11.9% TBW loss She is having a headache and dizziness today Going thru testing with Dr Corrin Dimes this week to test her VP shunt She has been walking more on good days She did go up on Ozempic  to 2 mg weekly She is doing better with food choices, intaking some sweets She is getting in more fruits and veggies Denies meal skipping or nausea from increased dose of Ozempic   Pharmacotherapy: Ozempic  2 mg once weekly injection  PHYSICAL EXAM:  Blood pressure 97/63, pulse 87, temperature 99.9 F (37.7 C), height 5' 7 (1.702 m), weight 207 lb (93.9 kg), last menstrual period 08/10/2020, SpO2 96%. Body mass index is 32.42 kg/m.  General: She is overweight, cooperative, alert, well developed, and in no acute distress. PSYCH: Has normal mood, affect and thought process.   Lungs: Normal breathing effort, no conversational dyspnea.   ASSESSMENT AND PLAN  TREATMENT PLAN FOR OBESITY:  Recommended Dietary Goals  April Meyer is currently in the action stage of change. As such, her goal is to continue  weight management plan. She has agreed to the Category 3 Plan.  Behavioral Intervention  We discussed the following Behavioral Modification Strategies today: increasing lean protein intake to established goals, increasing fiber rich foods, increasing water  intake , keeping healthy foods at home, practice mindfulness eating and understand the difference between hunger signals and cravings, planning for success, and continue to work on maintaining a reduced calorie state, getting the recommended amount of protein, incorporating whole foods, making healthy choices, staying well hydrated and practicing mindfulness when eating..  Additional resources provided today: NA  Recommended Physical Activity Goals  April Meyer has been advised to work up to 150 minutes of moderate intensity aerobic activity a week and strengthening exercises 2-3 times per week for cardiovascular health, weight loss maintenance and preservation of muscle mass.   She has agreed to Increase the intensity, frequency or duration of aerobic exercises   Increase walking as tolerated  Pharmacotherapy changes for the treatment of obesity: None  ASSOCIATED CONDITIONS ADDRESSED TODAY  Type 2 diabetes mellitus without complication, without long-term current use of insulin  (HCC) Doing well on Ozempic  2 mg once weekly injection.  Denies meal skipping or nausea.  She is still actively working on reducing her intake of added sugar.  Walking has been somewhat limited due to frequent headaches from IIH. -     Ozempic  (2 MG/DOSE); 2 mg once weekly injection  Dispense: 3 mL; Refill:  1  Class 1 obesity due to excess calories with serious comorbidity and body mass index (BMI) of 32.0 to 32.9 in adult Improving, reviewed results of bioimpedance with patient  IIH (idiopathic intracranial hypertension) Stable status post VP shunt and bilateral styloidectomy's with Dr. Corrin Dimes With weather changes, she is still having frequent headaches Upcoming visit  with neurosurgery  Vitamin D  deficiency She has shifted over to vitamin D  OTC 2000 IU once daily.  Will recheck level in the next 3 to 4 months.     She was informed of the importance of frequent follow up visits to maximize her success with intensive lifestyle modifications for her multiple health conditions.   ATTESTASTION STATEMENTS:  Reviewed by clinician on day of visit: allergies, medications, problem list, medical history, surgical history, family history, social history, and previous encounter notes pertinent to obesity diagnosis.   I have personally spent 30 minutes total time today in preparation, patient care, nutritional counseling and education,  and documentation for this visit, including the following: review of most recent clinical lab tests, prescribing medications/ refilling medications, reviewing medical assistant documentation, review and interpretation of bioimpedence results.     April Meyer, D.O. DABFM, DABOM [facility] [address] [place], [place] [address] [phone]

## 2023-09-08 ENCOUNTER — Ambulatory Visit (HOSPITAL_COMMUNITY): Admitting: Psychiatry

## 2023-09-08 ENCOUNTER — Other Ambulatory Visit: Payer: Self-pay

## 2023-09-08 ENCOUNTER — Encounter (HOSPITAL_COMMUNITY): Payer: Self-pay | Admitting: Psychiatry

## 2023-09-08 VITALS — BP 127/79 | HR 68 | Ht 67.0 in | Wt 207.0 lb

## 2023-09-08 DIAGNOSIS — F325 Major depressive disorder, single episode, in full remission: Secondary | ICD-10-CM | POA: Diagnosis not present

## 2023-09-08 MED ORDER — SERTRALINE HCL 25 MG PO TABS: 25.0000 mg | ORAL_TABLET | Freq: Every day | ORAL | 2 refills | Status: AC

## 2023-09-08 NOTE — Progress Notes (Signed)
 Psychiatric Initial Adult Assessment   Patient Identification: April Meyer MRN:  [id number] Date of Evaluation:  09/08/2023 Referral Source: Chief Complaint:   Visit Diagnosis: Major depression  History of Present Illne   Today the patient is actually doing quite well.  Her mood is good.  She has chronic headaches.  She has an opportunity to move to [place] which is probably going to take 1.  She was there on a trip and noticed that she had no headaches.  It is probably related to the atmosphere there.  Generally the patient is sleeping but she is sleeping a lot during the day as well.  She has psychomotor slowing.  She actually denies being sad or down or low.  He is eating well.  Patient has no children.  Her only son died of it drug overdose years ago.  The patient lives with a man named Merchant navy officer.  He is retired.  He is a long-term COVID.  They have a very good relationship.  The patient has no evidence of psychosis.  She takes her medicine as prescribed.  She is on multiple other physical medications.  Her mood is stable. Associated Signs/Symptoms: Depression Symptoms:  depressed mood, (Hypo) Manic Symptoms:    Anxiety Symptoms:    Psychotic Symptoms:    PTSD Symptoms: Negative  Past Psychiatric History: Zoloft   Previous Psychotropic Medications: Yes   Substance Abuse History in the last 12 months:  Yes.    Consequences of Substance Abuse: NA  Past Medical History:  Past Medical History:  Diagnosis Date   ADHD (attention deficit hyperactivity disorder)    no meds for   Anxiety    Arnold-Chiari deformity (HCC)    Asthma    AVM (arteriovenous malformation) spine 04/21/2018   Back pain    Breast cancer (HCC) 07/10/2022   Left   Bruises easily    Cerebral venous sinus thrombosis, remote, resolved 03/28/2013   Chronic pain    Common migraine with intractable migraine 04/21/2018   Complication of anesthesia age 32    breast lumpectomy heart rate went way down in hospital stayed  2 days, no problems since   Complication of anesthesia    C 1 to C 3 fusion side to side and up and down limited    Constipation    Depression    EDS (Ehlers-Danlos syndrome)    Eustachian tube dysfunction    Fatigue    GERD (gastroesophageal reflux disease)    Glioma (HCC)    low graded tectal plate glioma   Hemorrhoids    History of sleep apnea    no cpap used last 2 years, normal sleep study 2 yrs ago   HSV infection    Hx of blood clots    Hypothyroid    Hypothyroidism    IIH (idiopathic intracranial hypertension)    Joint pain    Lactose intolerance    Lumbar disc herniation    Migraine    Neck stiffness    Neuromuscular disorder (HCC)    Ehlers-Danlos Syndrome   Nystagmus, positional, central type 03/28/2013   Other malaise and fatigue 03/28/2013    The patient reports feeling happy, ED, sore on she has undergone at Chiari malformation surgery decompression in 2011. Prior to the surgery were chief complaints were weakness numbness and neck problems. She had swallowing difficulties and dizziness;  Classic Chiari presentation. But she didn't have headaches.   Photophobia    Pneumonia    Pre-diabetes    Sleep apnea  does not use CPAP   SOB (shortness of breath)    Tinnitus of both ears mainly in left ear    Urinary incontinence    Vision disturbance    Vitamin D  deficiency    Weakness    Wears glasses     Past Surgical History:  Procedure Laterality Date   ARNOLD CHIARI REPAIR  2010   BLADDER SUSPENSION N/A 11/21/2021   Procedure: SINGLE INCISION SLING PROCEDURE (Altis Sling);  Surgeon: Marilynne Rosaline SAILOR, MD;  Location: [facility];  Service: Gynecology;  Laterality: N/A;   Brain shunt  12/30/2019   vp shunt   BRAIN SURGERY     BREAST BIOPSY Left    lumpectomy age 63    BREAST BIOPSY Left 07/10/2022   US  LT BREAST BX W LOC DEV 1ST LESION IMG BX SPEC US  GUIDE 07/10/2022 [facility]   BREAST CYST EXCISION Bilateral 03/25/2023   Procedure: excision of  excess medial and lateral breast;  Surgeon: Lowery Estefana RAMAN, DO;  Location: [facility];  Service: Plastics;  Laterality: Bilateral;   BREAST EXCISIONAL BIOPSY Right yrs ago   CARPECTOMY WITH RADIAL STYLOIDECTOMY Bilateral    CHOLECYSTECTOMY  age 68   laparoscopic   colonscopy  2012, 2018   CYSTOSCOPY N/A 11/21/2021   Procedure: CYSTOSCOPY;  Surgeon: Marilynne Rosaline SAILOR, MD;  Location: [facility];  Service: Gynecology;  Laterality: N/A;   DILATION AND CURETTAGE OF UTERUS  last done yrs ago   x 2 or 3   ENDOMETRIAL ABLATION  02/09/2013   [facility]   ESOPHAGOGASTRODUODENOSCOPY N/A 02/05/2021   Procedure: ESOPHAGOGASTRODUODENOSCOPY (EGD);  Surgeon: Rollin Dover, MD;  Location: [facility];  Service: Endoscopy;  Laterality: N/A;   HYSTEROSCOPY WITH D & C N/A 06/26/2020   Procedure: DILATATION AND CURETTAGE /HYSTEROSCOPY;  Surgeon: Jannis Kate Norris, MD;  Location[facility];  Service: Gynecology;  Laterality: N/A;   INTRAUTERINE DEVICE INSERTION  10/2010   Mirena    IUD REMOVAL  11/2010   could not tolerate hormonal side effects   MASTECTOMY W/ SENTINEL NODE BIOPSY Left 08/25/2022   Procedure: LEFT MASTECTOMY WITH SENTINEL LYMPH NODE BIOPSY;  Surgeon: Curvin Deward MOULD, MD;  Location: [facility];  Service: General;  Laterality: Left;   metal plate removed from head  2015   screw came loose   neck fusion c1-c3  2010   OPERATIVE ULTRASOUND N/A 06/26/2020   Procedure: OPERATIVE ULTRASOUND;  Surgeon: Jannis Kate Norris, MD;  Location: [facility];  Service: Gynecology;  Laterality: N/A;   SIMPLE MASTECTOMY WITH AXILLARY SENTINEL NODE BIOPSY Right 08/25/2022   Procedure: RIGHT SIMPLE MASTECTOMY;  Surgeon: Curvin Deward MOULD, MD;  Location: [facility];  Service: General;  Laterality: Right;   TONSILLECTOMY AND ADENOIDECTOMY  age 9   UPPER GI ENDOSCOPY  2012, 2018   XI ROBOTIC ASSISTED TOTAL HYSTERECTOMY WITH  SACROCOLPOPEXY N/A 11/21/2021   Procedure: XI ROBOTIC ASSISTED TOTAL HYSTERECTOMY WITH BILATERAL SALPINGECTOMY  AND SACROCOLPOPEXY;  Surgeon: Marilynne Rosaline SAILOR, MD;  Location: [facility];  Service: Gynecology;  Laterality: N/A;  TOTAL TIME REQUESTED IS 3.5HRS    Family Psychiatric History:   Family History:  Family History  Problem Relation Age of Onset   Aortic dissection Father    Cancer Father        GI; dx after 36   Melanoma Father        dx after 66   Breast cancer Sister 75  bilateral   Diabetes Maternal Aunt    Breast cancer Maternal Aunt        x3 mat aunts; dx after 59; one w/ multiple primaries   Uterine cancer Maternal Aunt        dx 23s   Diabetes Maternal Uncle    Breast cancer Paternal Aunt 58   Lung cancer Paternal Aunt    Pancreatic cancer Paternal Uncle 58   Diabetes Maternal Grandmother    Cancer Maternal Grandmother        skin and lung; dx after 50   Diabetes Paternal Grandmother    Breast cancer Other        MGF's mother; dx after 11   Rectal cancer Cousin        d. 44s   Brain cancer Cousin     Social History:   Social History   Socioeconomic History   Marital status: Divorced    Spouse name: Not on file   Number of children: 1   Years of education: 16   Highest education level: Not on file  Occupational History    Employer: OTHER  Tobacco Use   Smoking status: Former    Types: Cigarettes   Smokeless tobacco: Never  Vaping Use   Vaping status: Never Used  Substance and Sexual Activity   Alcohol use: No    Comment: h/o binge drinking none in 10 years   Drug use: Yes    Types: Marijuana    Comment: daily use of marijuana recently-last smoked 03-18-23   Sexual activity: Not Currently    Partners: Male    Birth control/protection: Other-see comments, Surgical    Comment: hyst  Other Topics Concern   Not on file  Social History Narrative   Patient is divorced and lives at home with her one child.   Patient is disabled.   Patient  is right-handed.   Patient has a college education.   Patient drinks one cup of coffee daily.   Social Drivers of Corporate investment banker Strain: Not on file  Food Insecurity: Low Risk  (02/12/2023)   Received from [facility]   Hunger Vital Sign    Within the past 12 months, you worried that your food would run out before you got money to buy more: Never true    Within the past 12 months, the food you bought just didn't last and you didn't have money to get more. : Never true  Transportation Needs: No Transportation Needs (02/12/2023)   Received from [facility]    In the past 12 months, has lack of reliable transportation kept you from medical appointments, meetings, work or from getting things needed for daily living? : No  Physical Activity: Not on file  Stress: Not on file  Social Connections: Not on file    Additional Social History:   Allergies:   Allergies  Allergen Reactions   Advair Hfa [Fluticasone-Salmeterol] Anaphylaxis   Other Anaphylaxis    Steroids and Zucchini    Prednisone Anaphylaxis   Morphine And Codeine Itching    Severe    Metabolic Disorder Labs: Lab Results  Component Value Date   HGBA1C 5.9 (H) 06/09/2023   MPG 116.89 11/15/2021   MPG 99.67 01/30/2021   No results found for: PROLACTIN Lab Results  Component Value Date   CHOL 193 01/14/2023   TRIG 134 01/14/2023   HDL 54 01/14/2023   CHOLHDL 3.6 01/14/2023   LDLCALC 115 (H) 01/14/2023  Lab Results  Component Value Date   TSH 0.236 (L) 01/14/2023    Therapeutic Level Labs: No results found for: LITHIUM No results found for: CBMZ No results found for: VALPROATE  Current Medications: Current Outpatient Medications  Medication Sig Dispense Refill   acetaminophen  (TYLENOL ) 500 MG tablet Take 1 tablet (500 mg total) by mouth every 6 (six) hours as needed (pain). 30 tablet 0   acetaminophen  (TYLENOL ) 500 MG tablet Take 500 mg by mouth every 6 (six) hours  as needed for moderate pain or fever.     albuterol  (VENTOLIN  HFA) 108 (90 Base) MCG/ACT inhaler Inhale 1-2 puffs into the lungs every 4 (four) hours as needed for wheezing or shortness of breath.     buPROPion ER (WELLBUTRIN SR) 100 MG 12 hr tablet Take 100 mg by mouth 2 (two) times daily.     busPIRone  (BUSPAR ) 7.5 MG tablet Take 1 tablet (7.5 mg total) by mouth 2 (two) times daily. 60 tablet 5   clopidogrel (PLAVIX) 75 MG tablet Take 75 mg by mouth daily.     cyclobenzaprine  (FLEXERIL ) 5 MG tablet Take 1 tablet (5 mg total) by mouth 3 (three) times daily as needed. 90 tablet 2   cyclobenzaprine  (FLEXERIL ) 5 MG tablet Take 1 tablet (5 mg total) by mouth 3 (three) times daily as needed. 90 tablet 2   gabapentin  (NEURONTIN ) 100 MG capsule Take 1 capsule (100 mg total) by mouth at bedtime. 90 capsule 3   ibuprofen  (ADVIL ) 200 MG tablet Take 200 mg by mouth every 6 (six) hours as needed for fever or moderate pain.     ibuprofen  (ADVIL ) 600 MG tablet Take 1 tablet (600 mg total) by mouth every 6 (six) hours as needed. 30 tablet 0   rizatriptan  (MAXALT -MLT) 10 MG disintegrating tablet Take 10 mg by mouth as needed for migraine.     Semaglutide , 2 MG/DOSE, (OZEMPIC , 2 MG/DOSE,) 8 MG/3ML SOPN 2 mg once weekly injection 3 mL 1   SYNTHROID  137 MCG tablet Take 137 mcg by mouth every morning.     valACYclovir  (VALTREX ) 1000 MG tablet TAKE 2 TABS (2 GRAMS) BY MOUTH NOW AND REPEAT IN 12 HOURS X 1, AS NEEDED FOR COLDSORE 30 tablet 1   Vonoprazan Fumarate (VOQUEZNA) 10 MG TABS Take 10 mg by mouth daily.     cyclobenzaprine  (FLEXERIL ) 5 MG tablet Take 1 tablet (5 mg total) by mouth 3 (three) times daily as needed. 90 tablet 2   dexlansoprazole (DEXILANT) 60 MG capsule Take 60 mg by mouth at bedtime. (Patient not taking: Reported on 09/08/2023)     ipratropium-albuterol  (DUONEB) 0.5-2.5 (3) MG/3ML SOLN Take 3 mLs by nebulization every 6 (six) hours as needed (sob/wheezing).     lamoTRIgine  (LAMICTAL ) 25 MG tablet  Take 1 tablet by mouth daily.     loratadine  (CLARITIN ) 10 MG tablet Take 10 mg by mouth daily.     Menthol, Topical Analgesic, (BIOFREEZE EX) Apply 1 Application topically daily as needed (pain).     ondansetron  (ZOFRAN ) 4 MG tablet Take 1 tablet (4 mg total) by mouth every 8 (eight) hours as needed for nausea or vomiting. 20 tablet 0   ondansetron  (ZOFRAN ) 8 MG tablet Take 8 mg by mouth every 8 (eight) hours as needed for nausea or vomiting.     Oxycodone  HCl 10 MG TABS Take 1 tablet (10 mg total) by mouth 4 (four) times daily as needed for pain 120 tablet 0   Oxycodone  HCl 10 MG TABS  Take 1 tablet (10 mg total) by mouth 4 (four) times daily as needed. For pain 120 tablet 0   Oxycodone  HCl 10 MG TABS Take 1 tablet (10 mg total) by mouth 4 (four) times daily as needed. For pain *10/23/22* 120 tablet 0   Oxycodone  HCl 10 MG TABS Take 1 tablet (10 mg total) by mouth 4 (four) times daily as needed for pain. 120 tablet 0   Oxycodone  HCl 10 MG TABS Take 1 tablet (10 mg total) by mouth 4 (four) times daily as needed for pain. 120 tablet 0   Oxycodone  HCl 10 MG TABS Take 1 tablet by mouth every four hours as needed for pain 180 tablet 0   Oxycodone  HCl 10 MG TABS Take 1 tablet (10 mg total) by mouth every 4 (four) hours as needed for pain. 180 tablet 0   Oxycodone  HCl 10 MG TABS Take 1 tablet (10 mg total) by mouth 4 (four) times daily as needed for pain. 120 tablet 0   Oxycodone  HCl 10 MG TABS Take 1 tablet (10 mg total) by mouth 4 (four) times daily as needed for pain. 120 tablet 0   Oxycodone  HCl 10 MG TABS Take 1 tablet (10 mg total) by mouth every 4 (four) hours as needed for pain. 180 tablet 0   Oxycodone  HCl 10 MG TABS Take 1 tablet (10 mg total) by mouth every 4 (four) hours as needed for pain. 180 tablet 0   Oxycodone  HCl 10 MG TABS Take 1 tablet (10 mg total) by mouth every 4 (four) hours as needed for pain. 180 tablet 0   polyethylene glycol powder (GLYCOLAX /MIRALAX ) 17 GM/SCOOP powder Take 17  g by mouth daily. Drink 17g (1 scoop) dissolved in water  per day. (Patient taking differently: Take 17 g by mouth daily as needed for moderate constipation. Drink 17g (1 scoop) dissolved in water ) 255 g 0   promethazine  (PHENERGAN ) 12.5 MG tablet Take 1 tablet (12.5 mg total) by mouth 3 (three) times daily as needed. 20 tablet 1   promethazine  (PHENERGAN ) 12.5 MG tablet Take 1 tablet by mouth three times a day as needed 20 tablet 1   sertraline  (ZOLOFT ) 25 MG tablet Take 1 tablet (25 mg total) by mouth daily. 90 tablet 2   UNABLE TO FIND Take 1-2 tablets by mouth 2 (two) times daily as needed (sleep/pain). Cbd gummies     No current facility-administered medications for this visit.    Musculoskeletal: Strength & Muscle Tone: within normal limits Gait & Station: normal Patient leans: Right  Psychiatric Specialty Exam: Review of Systems  Blood pressure 127/79, pulse 68, height 5' 7 (1.702 m), weight 207 lb (93.9 kg), last menstrual period 08/10/2020.Body mass index is 32.42 kg/m.  General Appearance: Negative  Eye Contact:  Good  Speech:  Negative  Volume:  Normal  Mood:  Negative  Affect:  Congruent  Thought Process:  Goal Directed  Orientation:  Full (Time, Place, and Person)  Thought Content:  Logical  Suicidal Thoughts:  No  Homicidal Thoughts:  No  Memory:  NA  Judgement:  Good  Insight:  Good  Psychomotor Activity:  Normal  Concentration:  Concentration: Good  Recall:  Good  Fund of Knowledge:Good  Language: Good  Akathisia:  No  Handed:  Right  AIMS (if indicated):  not done  Assets:  Desire for Improvement  ADL's:  Intact  Cognition: WNL  Sleep:  Good   Screenings: PHQ2-9    Flowsheet Row Genetic Counseling from  07/17/2022 in [facility] [place] Counselor from 02[facility]18/2021 in [facility] Total Score 2 3  PHQ-9 Total Score -- 11   Flowsheet Row Admission  (Discharged) from 03/25/2023 in [facility] ED from 09/16/2022 in [facility] Admission (Discharged) from 08/25/2022 in [facility]  C-SSRS RISK CATEGORY No Risk No Risk No Risk    Assessment and Plan:     This patient is diagnosed with major depression in remission.  She will continue taking Zoloft  50 mg a day.  She will return to see me in 4 to 5 months.  She is very stable. Elna LILLETTE Lo, MD 7/1/20254:27 PM

## 2023-09-09 ENCOUNTER — Other Ambulatory Visit: Payer: Self-pay | Admitting: Family Medicine

## 2023-09-09 DIAGNOSIS — E119 Type 2 diabetes mellitus without complications: Secondary | ICD-10-CM

## 2023-09-13 ENCOUNTER — Encounter: Payer: Self-pay | Admitting: Emergency Medicine

## 2023-09-13 ENCOUNTER — Ambulatory Visit
Admission: EM | Admit: 2023-09-13 | Discharge: 2023-09-13 | Disposition: A | Discharge status: Home / Self Care | Attending: Family Medicine | Admitting: Family Medicine

## 2023-09-13 ENCOUNTER — Ambulatory Visit

## 2023-09-13 DIAGNOSIS — Z87442 Personal history of urinary calculi: Secondary | ICD-10-CM | POA: Diagnosis not present

## 2023-09-13 DIAGNOSIS — R310 Gross hematuria: Secondary | ICD-10-CM

## 2023-09-13 DIAGNOSIS — R109 Unspecified abdominal pain: Secondary | ICD-10-CM | POA: Diagnosis not present

## 2023-09-13 DIAGNOSIS — R1032 Left lower quadrant pain: Secondary | ICD-10-CM | POA: Diagnosis not present

## 2023-09-13 LAB — POCT URINALYSIS DIP (MANUAL ENTRY)
Bilirubin, UA: NEGATIVE
Glucose, UA: NEGATIVE mg/dL
Nitrite, UA: NEGATIVE
Protein Ur, POC: NEGATIVE mg/dL
Spec Grav, UA: >=1.03 — AB (ref 1.010–1.025)
Urobilinogen, UA: 0.2 U/dL
pH, UA: 5.5 (ref 5.0–8.0)

## 2023-09-13 NOTE — ED Triage Notes (Signed)
 Patient c/o LUQ pain since yesterday.  The pain started in her back and now in the LUQ abdomen area.  Patient did vomit today.  Pt states that she is constipated.  Last BM x 2 days ago.  Denies any urinary sx's.  Taken Ozempic  for DM Oxycodone  or pain.

## 2023-09-13 NOTE — Discharge Instructions (Signed)
Go directly to the emergency room

## 2023-09-13 NOTE — ED Provider Notes (Signed)
 [facility]    CSN: [id number] Arrival date & time: 09/13/23  1344      History   Chief Complaint Chief Complaint  Patient presents with   Abdominal Pain    HPI April Meyer is a 58 y.o. female.   Very pleasant 58 year old woman who is new to this clinic.  She is here for left abdominal pain.  She states that it has been present since yesterday.  She states that it comes in waves but it is getting more severe.  She has had nausea but no vomiting.  No sweats chills or fever.  She does have a history of chronic constipation.  She does have a history of kidney stones in the past.  Patient states she takes oxycodone  as needed for pain.  She uses MiraLAX  as needed for constipation.  She has not had a bowel movement for couple of days.  She has had hysterectomy.  She states her ovaries remain.  The pain is in her left lower quadrant.    Past Medical History:  Diagnosis Date   ADHD (attention deficit hyperactivity disorder)    no meds for   Anxiety    Arnold-Chiari deformity (HCC)    Asthma    AVM (arteriovenous malformation) spine 04/21/2018   Back pain    Breast cancer (HCC) 07/10/2022   Left   Bruises easily    Cerebral venous sinus thrombosis, remote, resolved 03/28/2013   Chronic pain    Common migraine with intractable migraine 04/21/2018   Complication of anesthesia age 75    breast lumpectomy heart rate went way down in hospital stayed 2 days, no problems since   Complication of anesthesia    C 1 to C 3 fusion side to side and up and down limited    Constipation    Depression    EDS (Ehlers-Danlos syndrome)    Eustachian tube dysfunction    Fatigue    GERD (gastroesophageal reflux disease)    Glioma (HCC)    low graded tectal plate glioma   Hemorrhoids    History of sleep apnea    no cpap used last 2 years, normal sleep study 2 yrs ago   HSV infection    Hx of blood clots    Hypothyroid    Hypothyroidism    IIH (idiopathic intracranial hypertension)     Joint pain    Lactose intolerance    Lumbar disc herniation    Migraine    Neck stiffness    Neuromuscular disorder (HCC)    Ehlers-Danlos Syndrome   Nystagmus, positional, central type 03/28/2013   Other malaise and fatigue 03/28/2013    The patient reports feeling happy, ED, sore on she has undergone at Chiari malformation surgery decompression in 2011. Prior to the surgery were chief complaints were weakness numbness and neck problems. She had swallowing difficulties and dizziness;  Classic Chiari presentation. But she didn't have headaches.   Photophobia    Pneumonia    Pre-diabetes    Sleep apnea    does not use CPAP   SOB (shortness of breath)    Tinnitus of both ears mainly in left ear    Urinary incontinence    Vision disturbance    Vitamin D  deficiency    Weakness    Wears glasses     Patient Active Problem List   Diagnosis Date Noted   Postoperative breast asymmetry 01/29/2023   Eating disorder 01/27/2023   Vitamin D  deficiency 01/27/2023   SOBOE (  shortness of breath on exertion) 01/14/2023   Other fatigue 01/14/2023   Monoallelic mutation of LZTR1 gene 09/21/2022   Cancer of left female breast (HCC) 08/25/2022   Genetic testing 07/24/2022   Primary malignant neoplasm of upper outer quadrant of female breast, left (HCC) 07/16/2022   Malignant neoplasm of upper-inner quadrant of left breast in female, estrogen receptor positive (HCC) 07/15/2022   Venous insufficiency (chronic) (peripheral) 07/15/2021   Varicose veins of bilateral lower extremities with pain 06/10/2021   Narcotic overdose (HCC) 01/30/2021   Type 2 diabetes mellitus without complication, without long-term current use of insulin  (HCC) 01/30/2021   GERD (gastroesophageal reflux disease) 01/30/2021   Acute respiratory failure with hypoxia and hypercapnia (HCC) 01/30/2021   Raynaud's phenomenon 11/02/2020   Rotator cuff arthropathy of left shoulder 11/02/2020   Scoliosis 11/02/2020   Pain in joint  involving pelvic region and thigh 11/02/2020   S/P VP shunt 06/19/2020   Attention deficit hyperactivity disorder 06/12/2020   Chronic low back pain 06/12/2020   Chronic obstructive pulmonary disease, unspecified (HCC) 06/12/2020   Compression of brain (HCC) 06/12/2020   Ehlers-Danlos syndrome, unspecified 06/12/2020   Prediabetes    Hypothyroid    IIH (idiopathic intracranial hypertension) 11/01/2018   AVM (arteriovenous malformation) spine 04/21/2018   Common migraine with intractable migraine 04/21/2018   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   Other malaise and fatigue 03/28/2013   History of Chiari malformation 03/28/2013   Hypersomnia, persistent 03/28/2013   Cerebral venous sinus thrombosis, remote, resolved 03/28/2013   Nystagmus, positional, central type 03/28/2013   Primary hypercoagulable state (HCC) 05/26/2012   Thrombosis, superior sagittal sinus 05/26/2012   Menorrhagia 07/31/2011   Anxiety and depression 07/30/2011   History of tobacco use 07/30/2011   Obesity 04/15/2011   OSA (obstructive sleep apnea) 04/15/2011   Asthma 04/15/2011   Glioma of brain (HCC) 04/15/2011   Herniated lumbar intervertebral disc 04/15/2011    Past Surgical History:  Procedure Laterality Date   ARNOLD CHIARI REPAIR  2010   BLADDER SUSPENSION N/A 11/21/2021   Procedure: SINGLE INCISION SLING PROCEDURE ([place]is Sling);  Surgeon: Marilynne Rosaline SAILOR, MD;  Location: [facility];  Service: Gynecology;  Laterality: N/A;   Brain shunt  12/30/2019   vp shunt   BRAIN SURGERY     BREAST BIOPSY Left    lumpectomy age 69    BREAST BIOPSY Left 07/10/2022   US  LT BREAST BX W LOC DEV 1ST LESION IMG BX SPEC US  GUIDE 07/10/2022 [facility]   BREAST CYST EXCISION Bilateral 03/25/2023   Procedure: excision of excess medial and lateral breast;  Surgeon: Lowery Estefana RAMAN, DO;  Location: [facility];  Service: Plastics;  Laterality: Bilateral;   BREAST EXCISIONAL BIOPSY Right yrs ago    CARPECTOMY WITH RADIAL STYLOIDECTOMY Bilateral    CHOLECYSTECTOMY  age 2   laparoscopic   colonscopy  2012, 2018   CYSTOSCOPY N/A 11/21/2021   Procedure: CYSTOSCOPY;  Surgeon: Marilynne Rosaline SAILOR, MD;  Location: [facility];  Service: Gynecology;  Laterality: N/A;   DILATION AND CURETTAGE OF UTERUS  last done yrs ago   x 2 or 3   ENDOMETRIAL ABLATION  02/09/2013   [facility]   ESOPHAGOGASTRODUODENOSCOPY N/A 02/05/2021   Procedure: ESOPHAGOGASTRODUODENOSCOPY (EGD);  Surgeon: Rollin Dover, MD;  Location: [facility];  Service: Endoscopy;  Laterality: N/A;   HYSTEROSCOPY WITH D & C N/A 06/26/2020   Procedure: DILATATION AND CURETTAGE /HYSTEROSCOPY;  Surgeon: Jannis Kate Norris, MD;  Location: [facility];  Service: Gynecology;  Laterality: N/A;   INTRAUTERINE DEVICE INSERTION  10/2010   Mirena    IUD REMOVAL  11/2010   could not tolerate hormonal side effects   MASTECTOMY W/ SENTINEL NODE BIOPSY Left 08/25/2022   Procedure: LEFT MASTECTOMY WITH SENTINEL LYMPH NODE BIOPSY;  Surgeon: Curvin Deward MOULD, MD;  Location: [facility];  Service: General;  Laterality: Left;   metal plate removed from head  2015   screw came loose   neck fusion c1-c3  2010   OPERATIVE ULTRASOUND N/A 06/26/2020   Procedure: OPERATIVE ULTRASOUND;  Surgeon: Jannis Kate Norris, MD;  Location: [facility];  Service: Gynecology;  Laterality: N/A;   SIMPLE MASTECTOMY WITH AXILLARY SENTINEL NODE BIOPSY Right 08/25/2022   Procedure: RIGHT SIMPLE MASTECTOMY;  Surgeon: Curvin Deward MOULD, MD;  Location: [facility];  Service: General;  Laterality: Right;   TONSILLECTOMY AND ADENOIDECTOMY  age 73   UPPER GI ENDOSCOPY  2012, 2018   XI ROBOTIC ASSISTED TOTAL HYSTERECTOMY WITH SACROCOLPOPEXY N/A 11/21/2021   Procedure: XI ROBOTIC ASSISTED TOTAL HYSTERECTOMY WITH BILATERAL SALPINGECTOMY  AND SACROCOLPOPEXY;  Surgeon: Marilynne Rosaline SAILOR, MD;  Location: [facility];  Service:  Gynecology;  Laterality: N/A;  TOTAL TIME REQUESTED IS 3.5HRS    OB History     Gravida  2   Para  1   Term  1   Preterm      AB  1   Living  0      SAB      IAB      Ectopic  1   Multiple      Live Births  1            Home Medications    Prior to Admission medications   Medication Sig Start Date End Date Taking? Authorizing Provider  acetaminophen  (TYLENOL ) 500 MG tablet Take 1 tablet (500 mg total) by mouth every 6 (six) hours as needed (pain). 11/01/21  Yes Marilynne Rosaline SAILOR, MD  acetaminophen  (TYLENOL ) 500 MG tablet Take 500 mg by mouth every 6 (six) hours as needed for moderate pain or fever.   Yes [provider]  albuterol  (VENTOLIN  HFA) 108 (90 Base) MCG/ACT inhaler Inhale 1-2 puffs into the lungs every 4 (four) hours as needed for wheezing or shortness of breath.   Yes [provider]  buPROPion ER (WELLBUTRIN SR) 100 MG 12 hr tablet Take 100 mg by mouth 2 (two) times daily.   Yes [provider]  busPIRone  (BUSPAR ) 7.5 MG tablet Take 1 tablet (7.5 mg total) by mouth 2 (two) times daily. 03/26/21  Yes Plovsky, Elna, MD  clopidogrel (PLAVIX) 75 MG tablet Take 75 mg by mouth daily.   Yes [provider]  cyclobenzaprine  (FLEXERIL ) 5 MG tablet Take 1 tablet (5 mg total) by mouth 3 (three) times daily as needed. 04/29/22  Yes   cyclobenzaprine  (FLEXERIL ) 5 MG tablet Take 1 tablet (5 mg total) by mouth 3 (three) times daily as needed. 11/18/22  Yes   cyclobenzaprine  (FLEXERIL ) 5 MG tablet Take 1 tablet (5 mg total) by mouth 3 (three) times daily as needed. 05/07/23  Yes   dexlansoprazole (DEXILANT) 60 MG capsule Take 60 mg by mouth at bedtime.   Yes [provider]  gabapentin  (NEURONTIN ) 100 MG capsule Take 1 capsule (100 mg total) by mouth at bedtime. 07/31/22  Yes Jertson, Jill Evelyn, MD  ibuprofen  (ADVIL ) 200 MG tablet Take 200 mg by mouth every 6 (six)  hours as needed for fever or moderate pain.   Yes [provider]  ibuprofen  (ADVIL ) 600 MG tablet Take 1 tablet (600 mg total) by mouth every 6 (six) hours as needed. 11/01/21  Yes Marilynne Rosaline SAILOR, MD  ipratropium-albuterol  (DUONEB) 0.5-2.5 (3) MG/3ML SOLN Take 3 mLs by nebulization every 6 (six) hours as needed (sob/wheezing). 05/05/22  Yes [provider]  lamoTRIgine  (LAMICTAL ) 25 MG tablet Take 1 tablet by mouth daily. 04/14/20  Yes [provider]  loratadine  (CLARITIN ) 10 MG tablet Take 10 mg by mouth daily.   Yes [provider]  Menthol, Topical Analgesic, (BIOFREEZE EX) Apply 1 Application topically daily as needed (pain).   Yes [provider]  ondansetron  (ZOFRAN ) 4 MG tablet Take 1 tablet (4 mg total) by mouth every 8 (eight) hours as needed for nausea or vomiting. 02/24/23  Yes Scheeler, Donnice PARAS, PA-C  ondansetron  (ZOFRAN ) 8 MG tablet Take 8 mg by mouth every 8 (eight) hours as needed for nausea or vomiting.   Yes [provider]  Oxycodone  HCl 10 MG TABS Take 1 tablet (10 mg total) by mouth 4 (four) times daily as needed for pain 08/27/22  Yes   Oxycodone  HCl 10 MG TABS Take 1 tablet (10 mg total) by mouth 4 (four) times daily as needed. For pain 09/24/22  Yes   Oxycodone  HCl 10 MG TABS Take 1 tablet (10 mg total) by mouth 4 (four) times daily as needed. For pain *10/23/22* 10/23/22  Yes   Oxycodone  HCl 10 MG TABS Take 1 tablet (10 mg total) by mouth 4 (four) times daily as needed for pain. 11/26/22  Yes   Oxycodone  HCl 10 MG TABS Take 1 tablet (10 mg total) by mouth 4 (four) times daily as needed for pain. 12/24/22  Yes   Oxycodone  HCl 10 MG TABS Take 1 tablet by mouth every four hours as needed for pain 01/23/23  Yes   Oxycodone  HCl 10 MG TABS Take 1 tablet (10 mg total) by mouth every 4 (four) hours as needed for pain. 03/12/23  Yes   Oxycodone  HCl 10 MG TABS Take 1 tablet (10 mg total) by mouth 4 (four) times daily as needed for pain. 04/09/23  Yes   Oxycodone  HCl 10 MG TABS Take 1 tablet  (10 mg total) by mouth 4 (four) times daily as needed for pain. 06/01/23  Yes   Oxycodone  HCl 10 MG TABS Take 1 tablet (10 mg total) by mouth every 4 (four) hours as needed for pain. 06/22/23  Yes   Oxycodone  HCl 10 MG TABS Take 1 tablet (10 mg total) by mouth every 4 (four) hours as needed for pain. 08/19/23  Yes   Oxycodone  HCl 10 MG TABS Take 1 tablet (10 mg total) by mouth every 4 (four) hours as needed for pain. 07/21/23  Yes   polyethylene glycol powder (GLYCOLAX /MIRALAX ) 17 GM/SCOOP powder Take 17 g by mouth daily. Drink 17g (1 scoop) dissolved in water  per day. Patient taking differently: Take 17 g by mouth daily as needed for moderate constipation. Drink 17g (1 scoop) dissolved in water  11/01/21  Yes Marilynne Rosaline SAILOR, MD  promethazine  (PHENERGAN ) 12.5 MG tablet Take 1 tablet (12.5 mg total) by mouth 3 (three) times daily as needed. 05/27/22  Yes   promethazine  (PHENERGAN ) 12.5 MG tablet Take 1 tablet by mouth three times a day as needed 01/15/23  Yes   rizatriptan  (MAXALT -MLT) 10 MG disintegrating tablet Take 10 mg by mouth as  needed for migraine. 04/10/21  Yes [provider]  Semaglutide , 2 MG/DOSE, (OZEMPIC , 2 MG/DOSE,) 8 MG/3ML SOPN 2 mg once weekly injection 08/25/23  Yes Bowen, Darice BRAVO, DO  sertraline  (ZOLOFT ) 25 MG tablet Take 1 tablet (25 mg total) by mouth daily. 09/08/23  Yes Plovsky, Elna, MD  SYNTHROID  137 MCG tablet Take 137 mcg by mouth every morning. 10/13/20  Yes [provider]  UNABLE TO FIND Take 1-2 tablets by mouth 2 (two) times daily as needed (sleep/pain). Cbd gummies   Yes [provider]  valACYclovir  (VALTREX ) 1000 MG tablet TAKE 2 TABS (2 GRAMS) BY MOUTH NOW AND REPEAT IN 12 HOURS X 1, AS NEEDED FOR COLDSORE 10/30/20  Yes Jertson, Jill Evelyn, MD  Vonoprazan Fumarate (VOQUEZNA) 10 MG TABS Take 10 mg by mouth daily.   Yes [provider]    Family History Family History  Problem Relation Age of Onset   Aortic dissection Father     Cancer Father        GI; dx after 32   Melanoma Father        dx after 34   Breast cancer Sister 57       bilateral   Diabetes Maternal Aunt    Breast cancer Maternal Aunt        x3 mat aunts; dx after 57; one w/ multiple primaries   Uterine cancer Maternal Aunt        dx 6s   Diabetes Maternal Uncle    Breast cancer Paternal Aunt 34   Lung cancer Paternal Aunt    Pancreatic cancer Paternal Uncle 18   Diabetes Maternal Grandmother    Cancer Maternal Grandmother        skin and lung; dx after 50   Diabetes Paternal Grandmother    Breast cancer Other        MGF's mother; dx after 88   Rectal cancer Cousin        d. 12s   Brain cancer Cousin     Social History Social History   Tobacco Use   Smoking status: Former    Types: Cigarettes   Smokeless tobacco: Never  Vaping Use   Vaping status: Never Used  Substance Use Topics   Alcohol use: No    Comment: h/o binge drinking none in 10 years   Drug use: Yes    Types: Marijuana    Comment: daily use of marijuana recently-last smoked 03-18-23     Allergies   Advair hfa [fluticasone-salmeterol], Other, Prednisone, and Morphine and codeine   Review of Systems Review of Systems  See HPI Physical Exam Triage Vital Signs ED Triage Vitals  Encounter Vitals Group     BP 09/13/23 1352 (!) 141/84     Girls Systolic BP Percentile --      Girls Diastolic BP Percentile --      Boys Systolic BP Percentile --      Boys Diastolic BP Percentile --      Pulse Rate 09/13/23 1352 65     Resp 09/13/23 1352 18     Temp 09/13/23 1352 99.1 F (37.3 C)     Temp Source 09/13/23 1352 Oral     SpO2 09/13/23 1352 94 %     Weight 09/13/23 1355 210 lb (95.3 kg)     Height 09/13/23 1355 5' 7 (1.702 m)     Head Circumference --      Peak Flow --      Pain Score 09/13/23  1354 8     Pain Loc --      Pain Education --      Exclude from Growth Chart --    No data found.  Updated Vital Signs BP (!) 141/84 (BP Location: Right Arm)    Pulse 65   Temp 99.1 F (37.3 C) (Oral)   Resp 18   Ht 5' 7 (1.702 m)   Wt 95.3 kg   LMP 08/10/2020   SpO2 94%   BMI 32.89 kg/m        Physical Exam Constitutional:      General: She is in acute distress.     Appearance: She is well-developed. She is ill-appearing.  HENT:     Head: Normocephalic and atraumatic.  Eyes:     Conjunctiva/sclera: Conjunctivae normal.     Pupils: Pupils are equal, round, and reactive to light.  Cardiovascular:     Rate and Rhythm: Normal rate.  Pulmonary:     Effort: Pulmonary effort is normal. No respiratory distress.  Abdominal:     General: Bowel sounds are normal. There is no distension.     Palpations: Abdomen is soft.     Tenderness: There is generalized abdominal tenderness and tenderness in the left lower quadrant.     Hernia: No hernia is present.     Comments: Patient appears acutely uncomfortable.  Is in a slightly flexed position.  No CVA tenderness but there is 10 tenderness to deep palpation in the left middle and left lower abdomen.  No palpable mass.  No guarding or rebound  Musculoskeletal:        General: Normal range of motion.     Cervical back: Normal range of motion.  Skin:    General: Skin is warm and dry.  Neurological:     Mental Status: She is alert.      UC Treatments / Results  Labs (all labs ordered are listed, but only abnormal results are displayed) Labs Reviewed  POCT URINALYSIS DIP (MANUAL ENTRY) - Abnormal; Notable for the following components:      Result Value   Ketones, POC UA small (15) (*)    Spec Grav, UA >=1.030 (*)    Blood, UA large (*)    Leukocytes, UA Trace (*)    All other components within normal limits    EKG   Radiology DG Abdomen 1 View Result Date: 09/13/2023 CLINICAL DATA:  Abdominal pain EXAM: ABDOMEN - 1 VIEW COMPARISON:  CT 01/03/2019 FINDINGS: Shunt tubing with tip in the right upper quadrant. Surgical clips in the right upper quadrant. Nonobstructed gas pattern with  moderate stool burden. Probable phleboliths in the pelvis. 9 mm suspected left kidney stone, appears to be in the region of UPJ. IMPRESSION: 1. Nonobstructed gas pattern with moderate stool burden. 2. 9 mm suspected left kidney stone, appears to be in the region of UPJ. Recommend CT KUB for further evaluation. These results will be called to the ordering clinician or representative by the Radiologist Assistant, and communication documented in the PACS or [employer]. Electronically Signed   By: Luke Bun M.D.   On: 09/13/2023 14:42    Procedures Procedures (including critical care time)  Medications Ordered in UC Medications - No data to display  Initial Impression / Assessment and Plan / UC Course  I have reviewed the triage vital signs and the nursing notes.  Pertinent labs & imaging results that were available during my care of the patient were reviewed by me and  considered in my medical decision making (see chart for details).     Urinalysis and x-ray findings suggest large stone at the UP junction.  Patient is sent to the emergency room for higher level of care.  ER is called with report.  Patient feels safe to drive.  Location confirmed with patient. Final Clinical Impressions(s) / UC Diagnoses   Final diagnoses:  Gross hematuria  Abdominal pain, left lower quadrant  History of kidney stones     Discharge Instructions      Go directly to the emergency room.   ED Prescriptions   None    PDMP not reviewed this encounter.   Maranda Jamee Jacob, MD 09/13/23 [phone]

## 2023-09-13 NOTE — ED Notes (Signed)
 Report given to [facility] triage nurse

## 2023-09-13 NOTE — ED Notes (Signed)
 Patient is being discharged from the Urgent Care and sent to the Emergency Department via private vehicle . Per Dr Maranda, patient is in need of higher level of care due to further evaluation. Patient is aware and verbalizes understanding of plan of care.  Vitals:   09/13/23 1352  BP: (!) 141/84  Pulse: 65  Resp: 18  Temp: 99.1 F (37.3 C)  SpO2: 94%

## 2023-09-17 ENCOUNTER — Other Ambulatory Visit (HOSPITAL_BASED_OUTPATIENT_CLINIC_OR_DEPARTMENT_OTHER): Payer: Self-pay

## 2023-09-17 MED ORDER — ONDANSETRON HCL 4 MG PO TABS
4.0000 mg | ORAL_TABLET | Freq: Two times a day (BID) | ORAL | 1 refills | Status: AC | PRN
  Filled 2023-09-17: qty 30, 15d supply, fill #0

## 2023-09-17 MED ORDER — OXYCODONE HCL 10 MG PO TABS
10.0000 mg | ORAL_TABLET | ORAL | 0 refills | Status: DC | PRN
  Filled 2023-10-21: qty 180, 30d supply, fill #0

## 2023-09-17 MED ORDER — CYCLOBENZAPRINE HCL 5 MG PO TABS
5.0000 mg | ORAL_TABLET | Freq: Three times a day (TID) | ORAL | 2 refills | Status: DC | PRN
  Filled 2023-09-17: qty 90, 30d supply, fill #0

## 2023-09-17 MED ORDER — OXYCODONE HCL 10 MG PO TABS
10.0000 mg | ORAL_TABLET | ORAL | 0 refills | Status: DC | PRN
  Filled 2023-09-18 – 2023-09-21 (×2): qty 180, 30d supply, fill #0

## 2023-09-18 ENCOUNTER — Other Ambulatory Visit (HOSPITAL_BASED_OUTPATIENT_CLINIC_OR_DEPARTMENT_OTHER): Payer: Self-pay

## 2023-09-21 ENCOUNTER — Other Ambulatory Visit (HOSPITAL_BASED_OUTPATIENT_CLINIC_OR_DEPARTMENT_OTHER): Payer: Self-pay

## 2023-09-22 ENCOUNTER — Other Ambulatory Visit (HOSPITAL_BASED_OUTPATIENT_CLINIC_OR_DEPARTMENT_OTHER): Payer: Self-pay

## 2023-10-06 ENCOUNTER — Ambulatory Visit: Admitting: Family Medicine

## 2023-10-19 ENCOUNTER — Ambulatory Visit (INDEPENDENT_AMBULATORY_CARE_PROVIDER_SITE_OTHER): Admitting: Family Medicine

## 2023-10-19 ENCOUNTER — Encounter: Payer: Self-pay | Admitting: Family Medicine

## 2023-10-19 VITALS — BP 110/78 | HR 87 | Temp 98.6°F | Ht 67.0 in | Wt 196.0 lb

## 2023-10-19 DIAGNOSIS — E66811 Obesity, class 1: Secondary | ICD-10-CM | POA: Diagnosis not present

## 2023-10-19 DIAGNOSIS — G932 Benign intracranial hypertension: Secondary | ICD-10-CM | POA: Diagnosis not present

## 2023-10-19 DIAGNOSIS — E119 Type 2 diabetes mellitus without complications: Secondary | ICD-10-CM | POA: Diagnosis not present

## 2023-10-19 DIAGNOSIS — Z6830 Body mass index (BMI) 30.0-30.9, adult: Secondary | ICD-10-CM

## 2023-10-19 DIAGNOSIS — E559 Vitamin D deficiency, unspecified: Secondary | ICD-10-CM

## 2023-10-19 DIAGNOSIS — Z7985 Long-term (current) use of injectable non-insulin antidiabetic drugs: Secondary | ICD-10-CM

## 2023-10-19 MED ORDER — OZEMPIC (2 MG/DOSE) 8 MG/3ML ~~LOC~~ SOPN: PEN_INJECTOR | SUBCUTANEOUS | 1 refills | Status: DC

## 2023-10-19 NOTE — Progress Notes (Signed)
 Office: [phone]  /  Fax: [phone]  WEIGHT SUMMARY AND BIOMETRICS  Starting Date: 01/14/23  Starting Weight: 235lb   Weight Lost Since Last Visit: 11lb   Vitals Temp: 98.6 F (37 C) BP: 110/78 Pulse Rate: 87 SpO2: 100 %   Body Composition  Body Fat %: 40.1 % Fat Mass (lbs): 78.8 lbs Muscle Mass (lbs): 111.8 lbs Total Body Water  (lbs): 75.8 lbs Visceral Fat Rating : 10    HPI  Chief Complaint: OBESITY  April Meyer is here to discuss her progress with her obesity treatment plan. She is on the the Category 3 Plan and states she is following her eating plan approximately 30 % of the time. She states she is exercising 30-60 minutes 2-3 times per week.  Interval History:  Since last office visit she is down 11 lb She is getting in more fruits and veggies She did travel to the beach and her sister has been her support system She has a net 39 lb of weight loss in the past 9 mos of medically supervised weight management This is a 16.5% total body weight loss She has been having a lot of headaches and body pain due to weather changes with April Meyer and April Meyer She has been doing some emotional stress eating She is still on Ozempic  2 mg weekly with improvement in portion control and cravings Exercise has been limited due to pain  Pharmacotherapy: Ozempic  2 mg once weekly injection  PHYSICAL EXAM:  Blood pressure 110/78, pulse 87, temperature 98.6 F (37 C), height 5' 7 (1.702 m), weight 196 lb (88.9 kg), last menstrual period 08/10/2020, SpO2 100%. Body mass index is 30.7 kg/m.  General: She is overweight, cooperative, alert, well developed, and in no acute distress. PSYCH: Has normal mood, affect and thought process.   Lungs: Normal breathing effort, no conversational dyspnea.  ASSESSMENT AND PLAN  TREATMENT PLAN FOR OBESITY:  Recommended Dietary Goals  April Meyer is currently in the action stage of change. As such, her goal is to continue weight management  plan. She has agreed to keeping a food journal and adhering to recommended goals of 1500 calories and 90 g protein and practicing portion control and making smarter food choices, such as increasing vegetables and decreasing simple carbohydrates.  Behavioral Intervention  We discussed the following Behavioral Modification Strategies today: increasing lean protein intake to established goals, increasing fiber rich foods, increasing water  intake , work on meal planning and preparation, keeping healthy foods at home, decreasing eating out or consumption of processed foods, and making healthy choices when eating convenient foods, practice mindfulness eating and understand the difference between hunger signals and cravings, work on managing stress, creating time for self-care and relaxation, avoiding temptations and identifying enticing environmental cues, and continue to work on maintaining a reduced calorie state, getting the recommended amount of protein, incorporating whole foods, making healthy choices, staying well hydrated and practicing mindfulness when eating..  Additional resources provided today: NA  Recommended Physical Activity Goals  April Meyer has been advised to work up to 150 minutes of moderate intensity aerobic activity a week and strengthening exercises 2-3 times per week for cardiovascular health, weight loss maintenance and preservation of muscle mass.   She has agreed to Start aerobic activity with a goal of 150 minutes a week at moderate intensity.   Pharmacotherapy changes for the treatment of obesity: None  ASSOCIATED CONDITIONS ADDRESSED TODAY  Type 2 diabetes mellitus without complication, without long-term current use of insulin  April Meyer) Lab Results  Component Value Date   HGBA1C 5.9 (H) 06/09/2023  She reports having her A1c rechecked by her PCP with results unable to view.  She will bring her lab results in next visit.  She is checking her blood sugars with a CGM.  A.m. fastings  are running between 70 and 120.  She has occasional low blood sugar readings but Ozempic  is her only listed medication for type 2 diabetes.  Her lowest reading was 59 and this was after waiting too long between eating meals.  She has improved her food choices and portion sizes.  She denies GI upset from Ozempic .  Will continue Ozempic  at 2 mg once weekly injection.  Consider referral to an RD for further nutritional support  -     Ozempic  (2 MG/DOSE); 2 mg once weekly injection  Dispense: 3 mL; Refill: 1  Class 1 obesity due to excess calories with serious comorbidity and body mass index (BMI) of 30.0 to 30.9 in adult Improving.  Reviewed results from her bioimpedance.  Will aim for a target body fat percent under 35, BMI under 30 and visceral fat rating under 10.  April Meyer (idiopathic intracranial hypertension) Stable.  She has routine follow-up with Dr. Edsel status post VP shunt.  She reports her most recent testing showed that her VP shunt was draining appropriately.  Her headache frequency has increased with weather changes this summer. Continue active plan for weight reduction Continue current medications  Vitamin D  deficiency Last vitamin D  Lab Results  Component Value Date   VD25OH 42.4 06/09/2023  Her last vitamin D  level was in the normal range on vitamin D  2000 IU once daily.  Plan to recheck vitamin D  level next visit and labs done by her PCP in the last few months.      She was informed of the importance of frequent follow up visits to maximize her success with intensive lifestyle modifications for her multiple health conditions.   ATTESTASTION STATEMENTS:  Reviewed by clinician on day of visit: allergies, medications, problem list, medical history, surgical history, family history, social history, and previous encounter notes pertinent to obesity diagnosis.   I have personally spent 30 minutes total time today in preparation, patient care, nutritional counseling and  education,  and documentation for this visit, including the following: review of most recent clinical lab tests, prescribing medications/ refilling medications, reviewing medical assistant documentation, review and interpretation of bioimpedence results.     April Meyer, D.O. DABFM, DABOM [facility] [address] [place], [place] [address] [phone]

## 2023-10-21 ENCOUNTER — Other Ambulatory Visit: Payer: Self-pay

## 2023-10-21 ENCOUNTER — Other Ambulatory Visit (HOSPITAL_BASED_OUTPATIENT_CLINIC_OR_DEPARTMENT_OTHER): Payer: Self-pay

## 2023-10-31 LAB — COLOGUARD: COLOGUARD: NEGATIVE

## 2023-11-17 ENCOUNTER — Other Ambulatory Visit (HOSPITAL_BASED_OUTPATIENT_CLINIC_OR_DEPARTMENT_OTHER): Payer: Self-pay

## 2023-11-17 MED ORDER — CYCLOBENZAPRINE HCL 5 MG PO TABS
5.0000 mg | ORAL_TABLET | Freq: Three times a day (TID) | ORAL | 2 refills | Status: AC | PRN
  Filled 2023-11-17: qty 90, 30d supply, fill #0

## 2023-11-17 MED ORDER — OXYCODONE HCL 10 MG PO TABS
10.0000 mg | ORAL_TABLET | ORAL | 0 refills | Status: AC | PRN
  Filled 2023-11-18: qty 180, 30d supply, fill #0

## 2023-11-17 MED ORDER — OXYCODONE HCL 10 MG PO TABS
10.0000 mg | ORAL_TABLET | ORAL | 0 refills | Status: AC | PRN
  Filled 2023-12-17: qty 180, 30d supply, fill #0

## 2023-11-18 ENCOUNTER — Other Ambulatory Visit: Payer: Self-pay

## 2023-11-18 ENCOUNTER — Other Ambulatory Visit (HOSPITAL_BASED_OUTPATIENT_CLINIC_OR_DEPARTMENT_OTHER): Payer: Self-pay

## 2023-12-15 ENCOUNTER — Other Ambulatory Visit (HOSPITAL_BASED_OUTPATIENT_CLINIC_OR_DEPARTMENT_OTHER): Payer: Self-pay

## 2023-12-16 ENCOUNTER — Ambulatory Visit (INDEPENDENT_AMBULATORY_CARE_PROVIDER_SITE_OTHER): Admitting: Family Medicine

## 2023-12-16 ENCOUNTER — Encounter: Payer: Self-pay | Admitting: Family Medicine

## 2023-12-16 VITALS — BP 112/77 | HR 75 | Temp 98.7°F | Ht 67.0 in | Wt 198.0 lb

## 2023-12-16 DIAGNOSIS — Q796 Ehlers-Danlos syndrome, unspecified: Secondary | ICD-10-CM | POA: Diagnosis not present

## 2023-12-16 DIAGNOSIS — G932 Benign intracranial hypertension: Secondary | ICD-10-CM

## 2023-12-16 DIAGNOSIS — E66811 Obesity, class 1: Secondary | ICD-10-CM

## 2023-12-16 DIAGNOSIS — Z7985 Long-term (current) use of injectable non-insulin antidiabetic drugs: Secondary | ICD-10-CM

## 2023-12-16 DIAGNOSIS — Z6831 Body mass index (BMI) 31.0-31.9, adult: Secondary | ICD-10-CM

## 2023-12-16 DIAGNOSIS — E119 Type 2 diabetes mellitus without complications: Secondary | ICD-10-CM

## 2023-12-16 MED ORDER — OZEMPIC (2 MG/DOSE) 8 MG/3ML ~~LOC~~ SOPN: PEN_INJECTOR | SUBCUTANEOUS | 1 refills | Status: DC

## 2023-12-16 NOTE — Progress Notes (Signed)
 Office: [phone]  /  Fax: [phone]  WEIGHT SUMMARY AND BIOMETRICS  Starting Date: 01/14/23  Starting Weight: 235lb   Weight Lost Since Last Visit: 0lb   Vitals Temp: 98.7 F (37.1 C) BP: 112/77 Pulse Rate: 75 SpO2: 98 %   Body Composition  Body Fat %: 39.1 % Fat Mass (lbs): 77.6 lbs Muscle Mass (lbs): 114.6 lbs Total Body Water  (lbs): 78.4 lbs Visceral Fat Rating : 10    HPI  Chief Complaint: OBESITY  April Meyer is here to discuss her progress with her obesity treatment plan. She is on the the Category 3 Plan and states she is following her eating plan approximately 10 % of the time. She states she is exercising 0 minutes 0 times per week.  Interval History:  Since last office visit she is up 2 lb She admits to being off track the past 2 months She has some satiety w/o adverse SE from Ozempic  2 mg weekly injection She now has a net weight loss of 37 lb in 11 mos of medically supervised weight management This is a 15.7% TBW loss She is hurting more with muscle aches this past month and has been less active She has been having more frequent headaches She has not been back to see her neurosurgeon She has not been tracking her calories and admits to poor food choices and boredom snacking She has not been checking her blood sugars She plans to add in some home workouts with gentle ROM exercise  Pharmacotherapy: Ozempic  2 mg weekly  PHYSICAL EXAM:  Blood pressure 112/77, pulse 75, temperature 98.7 F (37.1 C), height 5' 7 (1.702 m), weight 198 lb (89.8 kg), last menstrual period 08/10/2020, SpO2 98%. Body mass index is 31.01 kg/m.  General: She is overweight, cooperative, alert, well developed, and in no acute distress. PSYCH: Has normal mood, affect and thought process.   Lungs: Normal breathing effort, no conversational dyspnea.   ASSESSMENT AND PLAN  TREATMENT PLAN FOR OBESITY:  Recommended Dietary Goals  April Meyer is currently in the action stage of  change. As such, her goal is to continue weight management plan. She has agreed to the Category 3 Plan and keeping a food journal and adhering to recommended goals of 1500 calories and 90 g of  protein.  Behavioral Intervention  We discussed the following Behavioral Modification Strategies today: increasing lean protein intake to established goals, increasing fiber rich foods, increasing water  intake , work on meal planning and preparation, work on Counselling psychologist calories using tracking application, keeping healthy foods at home, work on managing stress, creating time for self-care and relaxation, avoiding temptations and identifying enticing environmental cues, continue to practice mindfulness when eating, planning for success, and getting back on track after recent relapse.  Additional resources provided today: NA  Recommended Physical Activity Goals  April Meyer has been advised to work up to 150 minutes of moderate intensity aerobic activity a week and strengthening exercises 2-3 times per week for cardiovascular health, weight loss maintenance and preservation of muscle mass.   She has agreed to Think about enjoyable ways to increase daily physical activity and overcoming barriers to exercise and Increase physical activity in their day and reduce sedentary time (increase NEAT).  Pharmacotherapy changes for the treatment of obesity: none  ASSOCIATED CONDITIONS ADDRESSED TODAY  Type 2 diabetes mellitus without complication, without long-term current use of insulin  (HCC) Lab Results  Component Value Date   HGBA1C 5.9 (H) 06/09/2023   Doing well on Ozempic  2  mg once weekly injection without hypoglycemia, meal skipping or GI upset.  She has room for improvement with her dietary intake and physical activity. If no longer improving over the next 2 mos, refer to RD -     Ozempic  (2 MG/DOSE); 2 mg once weekly injection  Dispense: 3 mL; Refill: 1  Class 1 obesity due to excess calories with  serious comorbidity and body mass index (BMI) of 31.0 to 31.9 in adult improving  IIH (idiopathic intracranial hypertension) Worsening with more frequent headaches Has not been able to be as active this past month due to increase in headaches Taking all listed meds and has not been back to neurosurgeon Asked her to contact [facility] for next steps  Ehlers-Danlos syndrome, unspecified Worsening with bilateral shoulder pain This has kept her 'on the couch' for over a month with little movement and more 'boredom and stress eating'.  This has affected her weight in the past 2 months.  She agrees to adding in gentle exercises supine or sitting over the next month.      She was informed of the importance of frequent follow up visits to maximize her success with intensive lifestyle modifications for her multiple health conditions.   ATTESTASTION STATEMENTS:  Reviewed by clinician on day of visit: allergies, medications, problem list, medical history, surgical history, family history, social history, and previous encounter notes pertinent to obesity diagnosis.      April Meyer, D.O. DABFM, DABOM [facility] [address] [place], [place] [address] [phone]

## 2023-12-17 ENCOUNTER — Other Ambulatory Visit: Payer: Self-pay

## 2023-12-17 ENCOUNTER — Other Ambulatory Visit (HOSPITAL_BASED_OUTPATIENT_CLINIC_OR_DEPARTMENT_OTHER): Payer: Self-pay

## 2024-01-12 ENCOUNTER — Other Ambulatory Visit (HOSPITAL_BASED_OUTPATIENT_CLINIC_OR_DEPARTMENT_OTHER): Payer: Self-pay

## 2024-01-12 MED ORDER — OXYCODONE HCL 10 MG PO TABS
10.0000 mg | ORAL_TABLET | ORAL | 0 refills | Status: DC | PRN
  Filled 2024-02-15: qty 180, 30d supply, fill #0

## 2024-01-12 MED ORDER — OXYCODONE HCL 10 MG PO TABS
10.0000 mg | ORAL_TABLET | ORAL | 0 refills | Status: AC | PRN
  Filled 2024-01-15: qty 180, 30d supply, fill #0

## 2024-01-12 MED ORDER — ONDANSETRON HCL 4 MG PO TABS
4.0000 mg | ORAL_TABLET | Freq: Two times a day (BID) | ORAL | 1 refills | Status: AC | PRN
  Filled 2024-01-12: qty 30, 15d supply, fill #0

## 2024-01-15 ENCOUNTER — Other Ambulatory Visit (HOSPITAL_BASED_OUTPATIENT_CLINIC_OR_DEPARTMENT_OTHER): Payer: Self-pay

## 2024-01-15 ENCOUNTER — Other Ambulatory Visit: Payer: Self-pay

## 2024-01-18 ENCOUNTER — Other Ambulatory Visit (HOSPITAL_BASED_OUTPATIENT_CLINIC_OR_DEPARTMENT_OTHER): Payer: Self-pay

## 2024-02-09 ENCOUNTER — Encounter (HOSPITAL_COMMUNITY): Payer: Self-pay

## 2024-02-09 ENCOUNTER — Ambulatory Visit (HOSPITAL_COMMUNITY): Admitting: Psychiatry

## 2024-02-14 ENCOUNTER — Other Ambulatory Visit: Payer: Self-pay | Admitting: Family Medicine

## 2024-02-14 DIAGNOSIS — E119 Type 2 diabetes mellitus without complications: Secondary | ICD-10-CM

## 2024-02-15 ENCOUNTER — Other Ambulatory Visit: Payer: Self-pay

## 2024-02-15 ENCOUNTER — Other Ambulatory Visit (HOSPITAL_BASED_OUTPATIENT_CLINIC_OR_DEPARTMENT_OTHER): Payer: Self-pay

## 2024-02-16 ENCOUNTER — Ambulatory Visit: Admitting: Family Medicine

## 2024-02-16 ENCOUNTER — Encounter: Payer: Self-pay | Admitting: Family Medicine

## 2024-02-16 VITALS — BP 113/77 | HR 69 | Temp 98.9°F | Ht 67.0 in | Wt 191.0 lb

## 2024-02-16 DIAGNOSIS — Z6829 Body mass index (BMI) 29.0-29.9, adult: Secondary | ICD-10-CM

## 2024-02-16 DIAGNOSIS — G932 Benign intracranial hypertension: Secondary | ICD-10-CM

## 2024-02-16 DIAGNOSIS — E663 Overweight: Secondary | ICD-10-CM

## 2024-02-16 DIAGNOSIS — E119 Type 2 diabetes mellitus without complications: Secondary | ICD-10-CM

## 2024-02-16 MED ORDER — OZEMPIC (2 MG/DOSE) 8 MG/3ML ~~LOC~~ SOPN: PEN_INJECTOR | SUBCUTANEOUS | 1 refills | Status: DC

## 2024-02-16 NOTE — Progress Notes (Signed)
 Office: [phone]  /  Fax: [phone]  WEIGHT SUMMARY AND BIOMETRICS  Starting Date: 01/14/23  Starting Weight: 235lb   Weight Lost Since Last Visit: 7lb   Vitals Temp: 98.9 F (37.2 C) BP: 113/77 Pulse Rate: 69 SpO2: 96 %   Body Composition  Body Fat %: 39.3 % Fat Mass (lbs): 75.2 lbs Muscle Mass (lbs): 110.6 lbs Total Body Water  (lbs): 75 lbs Visceral Fat Rating : 10     HPI  Chief Complaint: OBESITY  April Meyer is here to discuss her progress with her obesity treatment plan. She is on the the Category 3 Plan and states she is following her eating plan approximately 20 % of the time. She states she is exercising 30-60 minutes 4 times per week.  Interval History:  Since last office visit she is down 7 lb She is down 3.8 lb of muscle mass and down 2.4 lb of body fat since last visit This gives her a net weight loss of 44 lb in 13 mos of medically supervised weight management This is a 18.7% TBW loss She has improved appetite control and cravings  with Ozempic  2 mg weekly Her headaches have more frequent and severe with cold weather She plans to resume indoor exercises (eccentric exercises) She plan to improve compliance on cat 3 meal plan She has a beach trip planned this weekend Pain has interfered with sleep and walking  Pharmacotherapy: Ozempic  2 mg weekly for T2DM  PHYSICAL EXAM:  Blood pressure 113/77, pulse 69, temperature 98.9 F (37.2 C), height 5' 7 (1.702 m), weight 191 lb (86.6 kg), last menstrual period 08/10/2020, SpO2 96%. Body mass index is 29.91 kg/m.  General: She is overweight, cooperative, alert, well developed, and in no acute distress. PSYCH: Has normal mood, affect and thought process.   Lungs: Normal breathing effort, no conversational dyspnea.  ASSESSMENT AND PLAN  TREATMENT PLAN FOR OBESITY:  Recommended Dietary Goals  Cason is currently in the action stage of change. As such, her goal is to continue weight management  plan. She has agreed to keeping a food journal and adhering to recommended goals of 1500 calories and 85 g of  protein and practicing portion control and making smarter food choices, such as increasing vegetables and decreasing simple carbohydrates.  Behavioral Intervention  We discussed the following Behavioral Modification Strategies today: increasing lean protein intake to established goals, increasing fiber rich foods, avoiding skipping meals, increasing water  intake , work on meal planning and preparation, keeping healthy foods at home, identifying sources and decreasing liquid calories, decreasing eating out or consumption of processed foods, and making healthy choices when eating convenient foods, work on managing stress, creating time for self-care and relaxation, and avoiding temptations and identifying enticing environmental cues.  Additional resources provided today: NA  Recommended Physical Activity Goals  Lacye has been advised to work up to 150 minutes of moderate intensity aerobic activity a week and strengthening exercises 2-3 times per week for cardiovascular health, weight loss maintenance and preservation of muscle mass.   She has agreed to Think about enjoyable ways to increase daily physical activity and overcoming barriers to exercise and Increase physical activity in their day and reduce sedentary time (increase NEAT). Resume home exercise videos 3 days/ wk  Pharmacotherapy changes for the treatment of obesity: none  ASSOCIATED CONDITIONS ADDRESSED TODAY  Type 2 diabetes mellitus without complication, without long-term current use of insulin  (HCC) Lab Results  Component Value Date   HGBA1C 5.9 (H) 06/09/2023  Doing well on Ozempic  2 mg weekly w/o meal skipping, hypoglycemia or GI upset A1c with next labs Continue active plan for weight loss with a goal BMI 27 (currently 29) Work on limiting sugar and starch intake Great job with 18% TBW loss -     Ozempic  (2  MG/DOSE); 2 mg once weekly injection  Dispense: 3 mL; Refill: 1  Overweight (BMI 25.0-29.9) Improving Reviewed bioimpedence results with patient  BMI 29.0-29.9,adult  IIH (idiopathic intracranial hypertension) Worsening Managed by [facility] Fargen at [facility], she has been having more frequent severe headaches with weather changes.  Unfortunately, weight loss has not made a big impact on her IIH despite her BMI reduction to <30.  Continue current medications and set up follow up visit with neurosurgery.  Notably, her headaches are affecting sleep and exercise which can impact her obesity treatment plan.     She was informed of the importance of frequent follow up visits to maximize her success with intensive lifestyle modifications for her multiple health conditions.   ATTESTASTION STATEMENTS:  Reviewed by clinician on day of visit: allergies, medications, problem list, medical history, surgical history, family history, social history, and previous encounter notes pertinent to obesity diagnosis.   I have personally spent 30 minutes total time today in preparation, patient care, nutritional counseling and education,  and documentation for this visit, including the following: review of most recent clinical lab tests, prescribing medications/ refilling medications, reviewing medical assistant documentation, review and interpretation of bioimpedence results.     [name], D.O. DABFM, DABOM [facility] [address] [place], [place] [address] [phone]

## 2024-02-17 ENCOUNTER — Telehealth: Payer: Self-pay | Admitting: *Deleted

## 2024-02-17 NOTE — Telephone Encounter (Signed)
Prior authorization done via cover my meds for patients Ozempic. Waiting on determination.  

## 2024-02-17 NOTE — Telephone Encounter (Signed)
 Prior authorization for patients Ozempic  is approved. 02/17/2024-02/16/2025. Patient notified.

## 2024-03-15 ENCOUNTER — Other Ambulatory Visit (HOSPITAL_BASED_OUTPATIENT_CLINIC_OR_DEPARTMENT_OTHER): Payer: Self-pay

## 2024-03-15 ENCOUNTER — Other Ambulatory Visit: Payer: Self-pay

## 2024-03-15 MED ORDER — OXYCODONE HCL 10 MG PO TABS: 10.0000 mg | ORAL_TABLET | ORAL | 0 refills | Status: AC | PRN

## 2024-03-15 MED ORDER — OXYCODONE HCL 10 MG PO TABS
10.0000 mg | ORAL_TABLET | ORAL | 0 refills | Status: AC | PRN
  Filled 2024-03-15: qty 180, 30d supply, fill #0

## 2024-03-15 MED ORDER — ONDANSETRON HCL 4 MG PO TABS
4.0000 mg | ORAL_TABLET | Freq: Two times a day (BID) | ORAL | 1 refills | Status: AC | PRN
  Filled 2024-03-15: qty 30, 15d supply, fill #0

## 2024-03-15 MED ORDER — CYCLOBENZAPRINE HCL 5 MG PO TABS
5.0000 mg | ORAL_TABLET | Freq: Three times a day (TID) | ORAL | 2 refills | Status: AC | PRN
  Filled 2024-03-15: qty 90, 30d supply, fill #0

## 2024-04-05 ENCOUNTER — Ambulatory Visit: Admitting: Family Medicine

## 2024-04-07 ENCOUNTER — Other Ambulatory Visit: Payer: Self-pay | Admitting: Family Medicine

## 2024-04-07 DIAGNOSIS — E119 Type 2 diabetes mellitus without complications: Secondary | ICD-10-CM

## 2024-04-14 ENCOUNTER — Ambulatory Visit: Admitting: Family Medicine

## 2024-04-14 ENCOUNTER — Encounter: Payer: Self-pay | Admitting: Family Medicine

## 2024-04-14 VITALS — BP 110/72 | HR 99 | Ht 67.0 in | Wt 188.0 lb

## 2024-04-14 DIAGNOSIS — G932 Benign intracranial hypertension: Secondary | ICD-10-CM

## 2024-04-14 DIAGNOSIS — Z8639 Personal history of other endocrine, nutritional and metabolic disease: Secondary | ICD-10-CM | POA: Diagnosis not present

## 2024-04-14 DIAGNOSIS — Z6829 Body mass index (BMI) 29.0-29.9, adult: Secondary | ICD-10-CM

## 2024-04-14 DIAGNOSIS — E119 Type 2 diabetes mellitus without complications: Secondary | ICD-10-CM | POA: Diagnosis not present

## 2024-04-14 DIAGNOSIS — E663 Overweight: Secondary | ICD-10-CM | POA: Diagnosis not present

## 2024-04-14 DIAGNOSIS — Z7985 Long-term (current) use of injectable non-insulin antidiabetic drugs: Secondary | ICD-10-CM

## 2024-04-14 MED ORDER — OZEMPIC (2 MG/DOSE) 8 MG/3ML ~~LOC~~ SOPN: PEN_INJECTOR | SUBCUTANEOUS | 2 refills | Status: AC

## 2024-04-14 NOTE — Progress Notes (Signed)
 "  Office: [phone]  /  Fax: [phone]  WEIGHT SUMMARY AND BIOMETRICS  Starting Date: 01/14/23  Starting Weight: 235lb   Weight Lost Since Last Visit: 3lb   Vitals BP: 110/72 Pulse Rate: 99 SpO2: 97 %   Body Composition  Body Fat %: 38.9 % Fat Mass (lbs): 73.2 lbs Muscle Mass (lbs): 109.4 lbs Total Body Water  (lbs): 73.8 lbs Visceral Fat Rating : 9     HPI  Chief Complaint: OBESITY  April Meyer is here to discuss her progress with her obesity treatment plan. She is on the the Category 3 Plan and states she is following her eating plan approximately 0 % of the time. She states she is exercising 20 minutes 4 times per week.   Interval History:  Since last office visit she is down 3 lb She has dealt with more headaches due to weather changes with IIH She has a net weight loss of 47 lb in 15 mos of medically supervised weight management This is a 20% total body weight loss She feels better with eccentric exercise She did indulge more on sweets over the holidays and with the snow She plans to increase her walking time and improved food choices and meal planning over the next month  Pharmacotherapy: Ozempic  2 mg once weekly injection for type 2 diabetes  PHYSICAL EXAM:  Blood pressure 110/72, pulse 99, height 5' 7 (1.702 m), weight 188 lb (85.3 kg), last menstrual period 08/10/2020, SpO2 97%. Body mass index is 29.44 kg/m.  General: She is healthy appearing, cooperative, alert, well developed, and in no acute distress. PSYCH: Has normal mood, affect and thought process.   Lungs: Normal breathing effort, no conversational dyspnea.  ASSESSMENT AND PLAN  TREATMENT PLAN FOR OBESITY:  Recommended Dietary Goals  Jarica is currently in the action stage of change. As such, her goal is to continue weight management plan. She has agreed to the Category 3 Plan.  Behavioral Intervention  We discussed the following Behavioral Modification Strategies today: increasing lean  protein intake to established goals, increasing fiber rich foods, increasing water  intake , work on meal planning and preparation, keeping healthy foods at home, continue to practice mindfulness when eating, and planning for success.  Additional resources provided today: NA  Recommended Physical Activity Goals  Gwenlyn has been advised to work up to 150 minutes of moderate intensity aerobic activity a week and strengthening exercises 2-3 times per week for cardiovascular health, weight loss maintenance and preservation of muscle mass.   She has agreed to Think about enjoyable ways to increase daily physical activity and overcoming barriers to exercise and Increase physical activity in their day and reduce sedentary time (increase NEAT).  Pharmacotherapy changes for the treatment of obesity: None  ASSOCIATED CONDITIONS ADDRESSED TODAY  Type 2 diabetes mellitus without complication, without long-term current use of insulin  (HCC) Lab Results  Component Value Date   HGBA1C 5.9 (H) 06/09/2023  Improving.  She has good control of type 2 diabetes on Ozempic  2 mg once weekly injection without hypoglycemia, meal skipping or GI upset.  She has seen improved weight control with Ozempic .  Will continue at 2 mg once weekly injection while working on reducing intake of refined carbohydrates and added sugar, increasing walking time.  Repeat A1c in April.  -     Ozempic  (2 MG/DOSE); 2 mg once weekly injection  Dispense: 3 mL; Refill: 2  Overweight (BMI 25.0-29.9) Improving.  Reviewed bioimpedance results with patient.  She has achieved a BMI under  30 and a body fat percent of 38%, visceral fat rating of 9.  Her goal is to get her body fat percent under 35%.  BMI 29.0-29.9,adult  IIH (idiopathic intracranial hypertension) Worsening with weather change.  Frequent headaches have affected her ability to stick to a healthy eating and exercise plan over the past 2 months but she is motivated to continue working on  healthy changes.  Follow-up with neurosurgery as directed.  She has done well with weight loss as a treatment for IIH.  History of obesity She started with a BMI of 36.8 and is currently 29.  Reviewed overall progress Continue to work on positive self talk     She was informed of the importance of frequent follow up visits to maximize her success with intensive lifestyle modifications for her multiple health conditions.   ATTESTASTION STATEMENTS:  Reviewed by clinician on day of visit: allergies, medications, problem list, medical history, surgical history, family history, social history, and previous encounter notes pertinent to obesity diagnosis.   I have personally spent 30 minutes total time today in preparation, patient care, nutritional counseling and education,  and documentation for this visit, including the following: review of most recent clinical lab tests, prescribing medications/ refilling medications, reviewing medical assistant documentation, review and interpretation of bioimpedence results.     [name], D.O. DABFM, DABOM [facility] [address] [place], [place] [address] [phone] "

## 2024-07-05 ENCOUNTER — Ambulatory Visit: Admitting: Family Medicine
